# Patient Record
Sex: Female | Born: 1960 | Race: White | Hispanic: No | Marital: Single | Smoking: Never smoker
Health system: Southern US, Community
[De-identification: ages and names within clinical notes are randomized; demographics above are authoritative.]

## PROBLEM LIST (undated history)

## (undated) DIAGNOSIS — K219 Gastro-esophageal reflux disease without esophagitis: Secondary | ICD-10-CM

## (undated) DIAGNOSIS — C029 Malignant neoplasm of tongue, unspecified: Secondary | ICD-10-CM

## (undated) DIAGNOSIS — F419 Anxiety disorder, unspecified: Secondary | ICD-10-CM

## (undated) DIAGNOSIS — F32A Depression, unspecified: Secondary | ICD-10-CM

## (undated) HISTORY — PX: TUBAL LIGATION: SHX77

## (undated) HISTORY — PX: TONSILLECTOMY: SUR1361

---

## 2004-05-12 ENCOUNTER — Emergency Department: Payer: Self-pay | Admitting: Emergency Medicine

## 2004-05-14 ENCOUNTER — Ambulatory Visit: Payer: Self-pay | Admitting: Emergency Medicine

## 2004-06-22 ENCOUNTER — Ambulatory Visit: Payer: Self-pay

## 2007-05-25 ENCOUNTER — Ambulatory Visit: Payer: Self-pay

## 2011-06-24 ENCOUNTER — Emergency Department: Payer: Self-pay | Admitting: Emergency Medicine

## 2011-06-24 LAB — CBC
HCT: 41.4 % (ref 35.0–47.0)
HGB: 13.7 g/dL (ref 12.0–16.0)
MCHC: 33 g/dL (ref 32.0–36.0)
Platelet: 304 10*3/uL (ref 150–440)
RBC: 4.74 10*6/uL (ref 3.80–5.20)
RDW: 14.2 % (ref 11.5–14.5)

## 2011-06-24 LAB — URINALYSIS, COMPLETE
Bilirubin,UR: NEGATIVE
Blood: NEGATIVE
Glucose,UR: NEGATIVE mg/dL (ref 0–75)
Ketone: NEGATIVE
Nitrite: NEGATIVE
Ph: 5 (ref 4.5–8.0)
Protein: NEGATIVE
RBC,UR: 2 /HPF (ref 0–5)
Specific Gravity: 1.01 (ref 1.003–1.030)
Squamous Epithelial: 15
WBC UR: 6 /HPF (ref 0–5)

## 2011-06-24 LAB — COMPREHENSIVE METABOLIC PANEL
Alkaline Phosphatase: 99 U/L (ref 50–136)
Anion Gap: 5 — ABNORMAL LOW (ref 7–16)
Bilirubin,Total: 0.4 mg/dL (ref 0.2–1.0)
Calcium, Total: 9.4 mg/dL (ref 8.5–10.1)
Chloride: 105 mmol/L (ref 98–107)
Co2: 30 mmol/L (ref 21–32)
Creatinine: 0.91 mg/dL (ref 0.60–1.30)
EGFR (African American): >60
EGFR (Non-African Amer.): >60
Glucose: 101 mg/dL — ABNORMAL HIGH (ref 65–99)
Osmolality: 281 (ref 275–301)
Potassium: 4.3 mmol/L (ref 3.5–5.1)
SGPT (ALT): 18 U/L
Sodium: 140 mmol/L (ref 136–145)

## 2011-06-24 LAB — LIPASE, BLOOD: Lipase: 192 U/L (ref 73–393)

## 2014-04-22 DIAGNOSIS — M79605 Pain in left leg: Secondary | ICD-10-CM | POA: Insufficient documentation

## 2014-04-22 DIAGNOSIS — Z87898 Personal history of other specified conditions: Secondary | ICD-10-CM | POA: Insufficient documentation

## 2014-04-22 DIAGNOSIS — R0602 Shortness of breath: Secondary | ICD-10-CM | POA: Insufficient documentation

## 2014-04-22 DIAGNOSIS — R0789 Other chest pain: Secondary | ICD-10-CM | POA: Insufficient documentation

## 2014-07-05 ENCOUNTER — Encounter: Payer: Self-pay | Admitting: Urgent Care

## 2014-07-05 DIAGNOSIS — L509 Urticaria, unspecified: Secondary | ICD-10-CM | POA: Diagnosis not present

## 2014-07-05 DIAGNOSIS — R51 Headache: Secondary | ICD-10-CM | POA: Diagnosis not present

## 2014-07-05 DIAGNOSIS — R109 Unspecified abdominal pain: Secondary | ICD-10-CM | POA: Diagnosis not present

## 2014-07-05 DIAGNOSIS — R21 Rash and other nonspecific skin eruption: Secondary | ICD-10-CM | POA: Diagnosis present

## 2014-07-05 NOTE — ED Notes (Signed)
Patient presents with a 7 day history of rash to entire body; "It goes and comes back." Denies known etiology; no new meds, soaps, detergents, cosmetics, foods, etc.

## 2014-07-06 ENCOUNTER — Encounter: Payer: Self-pay | Admitting: Emergency Medicine

## 2014-07-06 ENCOUNTER — Emergency Department
Admission: EM | Admit: 2014-07-06 | Discharge: 2014-07-06 | Disposition: A | Discharge status: Home / Self Care | Payer: No Typology Code available for payment source | Attending: Emergency Medicine | Admitting: Emergency Medicine

## 2014-07-06 DIAGNOSIS — L299 Pruritus, unspecified: Secondary | ICD-10-CM

## 2014-07-06 DIAGNOSIS — L509 Urticaria, unspecified: Secondary | ICD-10-CM

## 2014-07-06 MED ORDER — FAMOTIDINE 20 MG PO TABS
40.0000 mg | ORAL_TABLET | Freq: Once | ORAL | Status: AC
  Administered 2014-07-06: 40 mg via ORAL

## 2014-07-06 MED ORDER — PREDNISONE 20 MG PO TABS
60.0000 mg | ORAL_TABLET | Freq: Once | ORAL | Status: AC
  Administered 2014-07-06: 60 mg via ORAL

## 2014-07-06 MED ORDER — HYDROXYZINE HCL 25 MG PO TABS
ORAL_TABLET | ORAL | Status: AC
  Administered 2014-07-06: 25 mg via ORAL
  Filled 2014-07-06: qty 1

## 2014-07-06 MED ORDER — FAMOTIDINE 40 MG PO TABS
40.0000 mg | ORAL_TABLET | Freq: Every evening | ORAL | Status: DC
Start: 2014-07-06 — End: 2018-12-21

## 2014-07-06 MED ORDER — FAMOTIDINE 20 MG PO TABS
ORAL_TABLET | ORAL | Status: AC
  Administered 2014-07-06: 40 mg via ORAL
  Filled 2014-07-06: qty 2

## 2014-07-06 MED ORDER — PREDNISONE 20 MG PO TABS: 60.0000 mg | ORAL_TABLET | Freq: Every day | ORAL | Status: AC

## 2014-07-06 MED ORDER — PREDNISONE 20 MG PO TABS
ORAL_TABLET | ORAL | Status: AC
  Administered 2014-07-06: 60 mg via ORAL
  Filled 2014-07-06: qty 3

## 2014-07-06 MED ORDER — HYDROXYZINE HCL 10 MG PO TABS
10.0000 mg | ORAL_TABLET | Freq: Three times a day (TID) | ORAL | Status: DC | PRN
Start: 2014-07-06 — End: 2018-12-21

## 2014-07-06 MED ORDER — HYDROXYZINE HCL 25 MG PO TABS
25.0000 mg | ORAL_TABLET | Freq: Once | ORAL | Status: AC
  Administered 2014-07-06: 25 mg via ORAL

## 2014-07-06 NOTE — Discharge Instructions (Signed)
Hives Hives are itchy, red, puffy (swollen) areas of the skin. Hives can change in size and location on your body. Hives can come and go for hours, days, or weeks. Hives do not spread from person to person (noncontagious). Scratching, exercise, and stress can make your hives worse. HOME CARE  Avoid things that cause your hives (triggers).  Take antihistamine medicines as told by your doctor. Do not drive while taking an antihistamine.  Take any other medicines for itching as told by your doctor.  Wear loose-fitting clothing.  Keep all doctor visits as told. GET HELP RIGHT AWAY IF:   You have a fever.  Your tongue or lips are puffy.  You have trouble breathing or swallowing.  You feel tightness in the throat or chest.  You have belly (abdominal) pain.  You have lasting or severe itching that is not helped by medicine.  You have painful or puffy joints. These problems may be the first sign of a life-threatening allergic reaction. Call your local emergency services (911 in [place]S.). MAKE SURE YOU:   Understand these instructions.  Will watch your condition.  Will get help right away if you are not doing well or get worse. Document Released: 10/31/2007 Document Revised: 07/23/2011 Document Reviewed: 04/16/2011 [facility] Patient Information 2015 [place], [place]. This information is not intended to replace advice given to you by your health care provider. Make sure you discuss any questions you have with your health care provider.  Pruritus  Pruritus is an itch. There are many different problems that can cause an itch. Dry skin is one of the most common causes of itching. Most cases of itching do not require medical attention.  HOME CARE INSTRUCTIONS  Make sure your skin is moistened on a regular basis. A moisturizer that contains petroleum jelly is best for keeping moisture in your skin. If you develop a rash, you may try the following for relief:   Use corticosteroid  cream.  Apply cool compresses to the affected areas.  Bathe with Epsom salts or baking soda in the bathwater.  Soak in colloidal oatmeal baths. These are available at your pharmacy.  Apply baking soda paste to the rash. Stir water into baking soda until it reaches a paste-like consistency.  Use an anti-itch lotion.  Take over-the-counter diphenhydramine medicine by mouth as the instructions direct.  Avoid scratching. Scratching may cause the rash to become infected. If itching is very bad, your caregiver may suggest prescription lotions or creams to lessen your symptoms.  Avoid hot showers, which can make itching worse. A cold shower may help with itching as long as you use a moisturizer after the shower. SEEK MEDICAL CARE IF: The itching does not go away after several days. Document Released: 10/03/2010 Document Revised: 06/07/2013 Document Reviewed: 10/03/2010 [facility] Patient Information 2015 [place], [place]. This information is not intended to replace advice given to you by your health care provider. Make sure you discuss any questions you have with your health care provider.

## 2014-07-06 NOTE — ED Provider Notes (Signed)
[facility] Emergency Department Provider Note  ____________________________________________  Time seen: Approximately 215 AM  I have reviewed the triage vital signs and the nursing notes.   HISTORY  Chief Complaint Rash    HPI MCCARTNEY BRUCKS is a 54 y.o. female who comes in with a rash that has been coming and going for approximately one week. She reports that she has been itching significantly with this rash and has been taking Benadryl without any relief. The patient denies any new soaps or creams and lotions foods or cosmetics. She is unsure what is the cause of the symptoms. She also reports that when the rash becomes bad she also gets a headache. Currently her headache is a 4-5 out of 10 in intensity of top of her head. She reports the itching is driving her crazy and left work to come in for evaluation. The patient reports that the Benadryl just makes her sleepy. She reports that the more active she becomes the more she notices the rash. The patient does not have a primary care physician. She said last week she did notice some swelling of her eye as well as her lipalong with the rash.   History reviewed. No pertinent past medical history.  There are no active problems to display for this patient.   History reviewed. No pertinent past surgical history.  Current Outpatient Rx  Name  Route  Sig  Dispense  Refill  . famotidine (PEPCID) 40 MG tablet   Oral   Take 1 tablet (40 mg total) by mouth every evening.   10 tablet   0   . hydrOXYzine (ATARAX/VISTARIL) 10 MG tablet   Oral   Take 1 tablet (10 mg total) by mouth 3 (three) times daily as needed.   15 tablet   0   . predniSONE (DELTASONE) 20 MG tablet   Oral   Take 3 tablets (60 mg total) by mouth daily.   12 tablet   0     Allergies Review of patient's allergies indicates no known allergies.  No family history on file.  Social History History  Substance Use Topics  . Smoking  status: Never Smoker   . Smokeless tobacco: Not on file  . Alcohol Use: Yes    Review of Systems Constitutional: No fever/chills Eyes: No visual changes. ENT: No sore throat. Cardiovascular: A chest cramp multiple days ago. Respiratory: Denies shortness of breath. Gastrointestinal: Occasional left-sided abdominal pain with no nausea or vomiting. Genitourinary: Negative for dysuria. Musculoskeletal: Negative for back pain. Skin: Rash and itching all over her body. Neurological: Negative for headaches, focal weakness or numbness.  10-point ROS otherwise negative.  ____________________________________________   PHYSICAL EXAM:  VITAL SIGNS: ED Triage Vitals  Enc Vitals Group     BP 07/05/14 2120 152/96 mmHg     Pulse Rate 07/05/14 2120 96     Resp 07/05/14 2120 18     Temp 07/05/14 2120 98.6 F (37 C)     Temp Source 07/05/14 2120 Oral     SpO2 07/05/14 2120 98 %     Weight 07/05/14 2120 180 lb (81.647 kg)     Height 07/05/14 2120 5\' 3"  (1.6 m)     Head Cir --      Peak Flow --      Pain Score 07/05/14 2120 4     Pain Loc --      Pain Edu? --      Excl. in [place]? --  Constitutional: Alert and oriented. Well appearing and in moderate distress. Eyes: Conjunctivae are normal. PERRL. EOMI. Head: Atraumatic. Nose: No congestion/rhinnorhea. Mouth/Throat: Mucous membranes are moist.  Oropharynx non-erythematous. Cardiovascular: Normal rate, regular rhythm. Grossly normal heart sounds.  Good peripheral circulation. Respiratory: Normal respiratory effort.  No retractions. Lungs CTAB. Gastrointestinal: Soft and nontender. No distention. Positive bowel sounds Genitourinary: Deferred Musculoskeletal: No lower extremity tenderness nor edema.   Neurologic:  Normal speech and language. No gross focal neurologic deficits are appreciated.  Skin:  Scattered erythema and hives all over body on patient's abdomen and breasts arms and back. Psychiatric: Mood and affect are normal.    ____________________________________________   LABS (all labs ordered are listed, but only abnormal results are displayed)  Labs Reviewed - No data to display ____________________________________________  EKG  None ____________________________________________  RADIOLOGY  None ____________________________________________   PROCEDURES  Procedure(s) performed: None  Critical Care performed: No  ____________________________________________   INITIAL IMPRESSION / ASSESSMENT AND PLAN / ED COURSE  Pertinent labs & imaging results that were available during my care of the patient were reviewed by me and considered in my medical decision making (see chart for details).  The patient is a 54 year old female who comes in with a rash and urticaria diffusely all over her body. The patient reports that the Benadryl is not working and is unsure if she needs a steroid. She also is unsure if the trigger of the symptoms. I will give the patient a dose of prednisone, a dose of Pepcid and some Vistaril and reassess the patient.  ----------------------------------------- 4:26 AM on 07/06/2014 -----------------------------------------  The patient reports that she does still have some itching is improved on her abdomen and chest but she does still have some itching in her legs. I will discharge the patient home to follow-up with [facility] for further evaluation of her hives and itching. ____________________________________________   FINAL CLINICAL IMPRESSION(S) / ED DIAGNOSES  Final diagnoses:  Hives  Itching      Loney Hering, MD 07/06/14 [id number]

## 2016-05-02 ENCOUNTER — Ambulatory Visit (HOSPITAL_COMMUNITY): Payer: No Typology Code available for payment source

## 2016-05-07 ENCOUNTER — Other Ambulatory Visit: Payer: Self-pay | Admitting: Obstetrics and Gynecology

## 2016-05-07 DIAGNOSIS — Z1231 Encounter for screening mammogram for malignant neoplasm of breast: Secondary | ICD-10-CM

## 2016-05-16 ENCOUNTER — Ambulatory Visit (HOSPITAL_COMMUNITY): Payer: No Typology Code available for payment source

## 2018-12-21 ENCOUNTER — Emergency Department: Payer: Self-pay

## 2018-12-21 ENCOUNTER — Emergency Department
Admission: EM | Admit: 2018-12-21 | Discharge: 2018-12-21 | Disposition: A | Discharge status: Home / Self Care | Payer: Self-pay | Attending: Emergency Medicine | Admitting: Emergency Medicine

## 2018-12-21 ENCOUNTER — Other Ambulatory Visit: Payer: Self-pay

## 2018-12-21 ENCOUNTER — Encounter: Payer: Self-pay | Admitting: Emergency Medicine

## 2018-12-21 DIAGNOSIS — M542 Cervicalgia: Secondary | ICD-10-CM | POA: Insufficient documentation

## 2018-12-21 DIAGNOSIS — R590 Localized enlarged lymph nodes: Secondary | ICD-10-CM | POA: Insufficient documentation

## 2018-12-21 DIAGNOSIS — Z20828 Contact with and (suspected) exposure to other viral communicable diseases: Secondary | ICD-10-CM | POA: Insufficient documentation

## 2018-12-21 LAB — COMPREHENSIVE METABOLIC PANEL
ALT: 15 U/L (ref 0–44)
AST: 20 U/L (ref 15–41)
Albumin: 4.2 g/dL (ref 3.5–5.0)
Alkaline Phosphatase: 97 U/L (ref 38–126)
Anion gap: 11 (ref 5–15)
BUN: 12 mg/dL (ref 6–20)
CO2: 25 mmol/L (ref 22–32)
Calcium: 9.6 mg/dL (ref 8.9–10.3)
Chloride: 105 mmol/L (ref 98–111)
Creatinine, Ser: 0.79 mg/dL (ref 0.44–1.00)
GFR calc Af Amer: >60 mL/min (ref >60)
GFR calc non Af Amer: >60 mL/min (ref >60)
Glucose, Bld: 180 mg/dL — ABNORMAL HIGH (ref 70–99)
Potassium: 3.7 mmol/L (ref 3.5–5.1)
Sodium: 141 mmol/L (ref 135–145)
Total Bilirubin: 0.7 mg/dL (ref 0.3–1.2)
Total Protein: 8.1 g/dL (ref 6.5–8.1)

## 2018-12-21 LAB — CBC WITH DIFFERENTIAL/PLATELET
Abs Immature Granulocytes: 0.04 10*3/uL (ref 0.00–0.07)
Basophils Absolute: 0 10*3/uL (ref 0.0–0.1)
Basophils Relative: 0 %
Eosinophils Absolute: 0.2 10*3/uL (ref 0.0–0.5)
Eosinophils Relative: 1 %
HCT: 41.7 % (ref 36.0–46.0)
Hemoglobin: 13.8 g/dL (ref 12.0–15.0)
Immature Granulocytes: 0 %
Lymphocytes Relative: 15 %
Lymphs Abs: 1.8 10*3/uL (ref 0.7–4.0)
MCH: 29.2 pg (ref 26.0–34.0)
MCHC: 33.1 g/dL (ref 30.0–36.0)
MCV: 88.3 fL (ref 80.0–100.0)
Monocytes Absolute: 0.8 10*3/uL (ref 0.1–1.0)
Monocytes Relative: 6 %
Neutro Abs: 9.5 10*3/uL — ABNORMAL HIGH (ref 1.7–7.7)
Neutrophils Relative %: 78 %
Platelets: 282 10*3/uL (ref 150–400)
RBC: 4.72 MIL/uL (ref 3.87–5.11)
RDW: 12.4 % (ref 11.5–15.5)
WBC: 12.3 10*3/uL — ABNORMAL HIGH (ref 4.0–10.5)
nRBC: 0 % (ref 0.0–0.2)

## 2018-12-21 LAB — SARS CORONAVIRUS 2 (TAT 6-24 HRS): SARS Coronavirus 2: NEGATIVE

## 2018-12-21 MED ORDER — PREDNISONE 10 MG (21) PO TBPK: ORAL_TABLET | ORAL | 0 refills | Status: DC

## 2018-12-21 MED ORDER — AMOXICILLIN-POT CLAVULANATE 875-125 MG PO TABS: 1.0000 | ORAL_TABLET | Freq: Two times a day (BID) | ORAL | 0 refills | Status: AC

## 2018-12-21 MED ORDER — IOHEXOL 300 MG/ML  SOLN
75.0000 mL | Freq: Once | INTRAMUSCULAR | Status: AC | PRN
  Administered 2018-12-21: 75 mL via INTRAVENOUS

## 2018-12-21 MED ORDER — SODIUM CHLORIDE 0.9 % IV SOLN
3.0000 g | Freq: Once | INTRAVENOUS | Status: AC
  Administered 2018-12-21: 3 g via INTRAVENOUS
  Filled 2018-12-21: qty 8

## 2018-12-21 MED ORDER — DEXAMETHASONE SODIUM PHOSPHATE 10 MG/ML IJ SOLN
10.0000 mg | Freq: Once | INTRAMUSCULAR | Status: AC
  Administered 2018-12-21: 10 mg via INTRAVENOUS
  Filled 2018-12-21: qty 1

## 2018-12-21 MED ORDER — OXYCODONE-ACETAMINOPHEN 5-325 MG PO TABS: 1.0000 | ORAL_TABLET | Freq: Three times a day (TID) | ORAL | 0 refills | Status: DC | PRN

## 2018-12-21 MED ORDER — KETOROLAC TROMETHAMINE 30 MG/ML IJ SOLN
30.0000 mg | Freq: Once | INTRAMUSCULAR | Status: AC
  Administered 2018-12-21: 30 mg via INTRAVENOUS
  Filled 2018-12-21: qty 1

## 2018-12-21 NOTE — ED Triage Notes (Signed)
Pt reports for the past few days she has had swelling to the left side of her neck and now the left side of her face and left ear hurts as well. Pt reports a month ago she fell in the bathtub and hurt that side of her neck but isn't sure if that is what caused the swelling.

## 2018-12-21 NOTE — Progress Notes (Signed)
[facility]  [address], [address] [place], [place] [address] Phone: [phone]  Fax: [phone]   Clinic Day:  12/22/2018  Referring physician: Earleen Newport, MD  Chief Complaint: Connie Cameron is a 58 y.o. female with left neck adenopathy who is referred in consultation by Dr. Algis Liming. Williams for assessment and management.   HPI:   The patient notes that the left side of her neck has been swollen for over a year.  By report, she saw Dr Richardson Landry in ENT in 2016 with left neck adenopathy and urticaria.  FNA was performed, but no report is available.  She states that she didn't hear anything, and thus felt the results were fine.  She did not see dermatology.  The patient was presented to the [facility] ER on 12/21/2018 with a swollen neck.  She noted 4 days of increased pain and swelling in her left neck.  Pain radiated into the left side of her face and left ear.  Physical exam revealed a large left-sided anterior cervical neck swelling. Area was tender to touch without overlying erythema.  She has had no scalp or dental issues.  Temperature was 100.1.  Neck CT on 12/21/2018 revealed a 2.4 x 2.1 x 3.4 cm lymph node in the left upper neck.  The lymph node showed heterogeneous enhancement and stranding in the surrounding fat. There was a 6 mm left upper posterior lymph node and a 7 mm right level 3 lymph node.  Imaging was concerning for malignancy.  There were no other enlarged lymph nodes.  There was no pharyngeal mass. Biopsy was recommended.  Direct mucosal inspection was recommended.  Labs on 12/21/2018 included a hematocrit 41.7, hemoglobin 13.8, MCV 88.3, platelets 282,000, WBC 12,300 (ANC 9,500).  COVID-19 testing was negative.  CXR revealed minimal bibasilar atelectasis.    She received IV Unasyn, Decadron and Toradol for pain.  She was prescribed Augmentin  Symptomatically, she is doing "ok".  She describes some element of fatigue.  She denies any  fevers, sweats or weight loss.  She notes that she does not go to the doctors office unless it is absolutely necessary. She does not have a PCP.  Neck pain has decreased since yesterday after taking the prescribed medication.  She has some ear pain that radiates up from her neck.  She denies any headaches, fevers, rhinorrhea, or sore throat.  She denies any abdominal and urinary symptoms. She denies any bone or joint pain.    History reviewed. No pertinent past medical history.  History reviewed. No pertinent surgical history.  Family History  Problem Relation Age of Onset   Breast cancer Sister    Brain cancer Paternal Grandfather   She has multiple aunts (paternal) who had breast cancer and bladder cancer. Her paternal cousinhas leukemia.    Social History:  reports that she has never smoked. She has never used smokeless tobacco. She reports current alcohol use. She reports that she does not use drugs.  She use to drink years ago. She denies any exposure to radiation or toxins. 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  She is currently not working.  She lives in [place].  The patient is accompanied by her boyfriend, Jeneen Rinks, over the [place] today.   Allergies: No Known Allergies  Current Medications: Current Outpatient Medications  Medication Sig Dispense Refill   amoxicillin-clavulanate (AUGMENTIN) 875-125 MG tablet Take 1 tablet by mouth every 12 (twelve) hours for 10 days. 20 tablet  0   oxyCODONE-acetaminophen (PERCOCET) 5-325 MG tablet Take 1 tablet by mouth every 8 (eight) hours as needed. 20 tablet 0   predniSONE (STERAPRED UNI-PAK 21 TAB) 10 MG (21) TBPK tablet Dispense taper pack as directed 21 tablet 0   No current facility-administered medications for this visit.     Review of Systems  Constitutional: Negative for chills, diaphoresis, fever, malaise/fatigue and weight loss.       Feels "kind of fatigued".  HENT: Positive for ear pain. Negative for congestion, hearing  loss, nosebleeds, sinus pain, sore throat and tinnitus.        No dental issues.  Eyes: Positive for blurred vision (has not seen opthalmologist in some time). Negative for double vision and photophobia.  Respiratory: Negative.  Negative for cough, hemoptysis, sputum production and shortness of breath.   Cardiovascular: Negative.  Negative for chest pain, palpitations and leg swelling.  Gastrointestinal: Negative.  Negative for abdominal pain, blood in stool, constipation, diarrhea, melena, nausea and vomiting.  Genitourinary: Negative.  Negative for dysuria, frequency, hematuria and urgency.  Musculoskeletal: Positive for neck pain (left neck; increased pain x 4 days). Negative for back pain, joint pain and myalgias.       Left neck swollen > 1 year.  Skin: Negative.  Negative for itching and rash.  Neurological: Negative.  Negative for dizziness, tingling, sensory change, focal weakness, weakness and headaches.  Endo/Heme/Allergies: Negative.  Does not bruise/bleed easily.  Psychiatric/Behavioral: Negative for depression and memory loss. The patient is nervous/anxious. The patient does not have insomnia.   All other systems reviewed and are negative.  Performance status (ECOG): 1  Vitals Blood pressure (!) 147/89, pulse 96, temperature (!) 96.6 F (35.9 C), temperature source Tympanic, resp. rate 18, height 5\' 3"  (1.6 m), weight 175 lb (79.4 kg), SpO2 100 %.  Physical Exam  Constitutional: She is oriented to person, place, and time. She appears well-developed and well-nourished. No distress. Face mask in place.  HENT:  Head: Normocephalic and atraumatic. Hair is normal (calp unremarkable).  Right Ear: Tympanic membrane and ear canal normal.  Left Ear: Tympanic membrane and ear canal normal.  Mouth/Throat: Oropharynx is clear and moist. No oropharyngeal exudate.  Long gray blonde hair pulled in ponytail.  No dental issues.  S/p tonsillectomy.  No pain on clenching teeth.  Left TM  slightly dull, but without bulging or erythema.  Eyes: Pupils are equal, round, and reactive to light. Conjunctivae and EOM are normal. No scleral icterus.  Neck: Normal range of motion. Neck supple.  Cardiovascular: Normal rate and normal heart sounds.  No murmur heard. Pulmonary/Chest: Effort normal and breath sounds normal. No respiratory distress. She has no wheezes. She has no rales.  Abdominal: Soft. Bowel sounds are normal. She exhibits no distension and no mass. There is no abdominal tenderness. There is no rebound and no guarding.  Normal spleen.  Musculoskeletal: Normal range of motion.        General: No tenderness or edema.  Lymphadenopathy:       Head (right side): No preauricular, no posterior auricular and no occipital adenopathy present.       Head (left side): No preauricular, no posterior auricular and no occipital adenopathy present.    She has cervical adenopathy (left ).    She has no axillary adenopathy.       Right: No inguinal and no supraclavicular adenopathy present.       Left: No inguinal and no supraclavicular adenopathy present.  Egg shaped 3.5  x 2.0 cm tender high left anterior cervical lymph node.   Neurological: She is alert and oriented to person, place, and time.  Skin: Skin is warm and dry. No rash noted. She is not diaphoretic. No erythema. No pallor.  Right neck and lower back tattoo.  Psychiatric: She has a normal mood and affect. Her behavior is normal. Judgment and thought content normal.  Nursing note and vitals reviewed.   Admission on 12/21/2018, Discharged on 12/21/2018  Component Date Value Ref Range Status   WBC 12/21/2018 12.3* 4.0 - 10.5 K/uL Final   RBC 12/21/2018 4.72  3.87 - 5.11 MIL/uL Final   Hemoglobin 12/21/2018 13.8  12.0 - 15.0 g/dL Final   HCT 12/21/2018 41.7  36.0 - 46.0 % Final   MCV 12/21/2018 88.3  80.0 - 100.0 fL Final   MCH 12/21/2018 29.2  26.0 - 34.0 pg Final   MCHC 12/21/2018 33.1  30.0 - 36.0 g/dL Final    RDW 12/21/2018 12.4  11.5 - 15.5 % Final   Platelets 12/21/2018 282  150 - 400 K/uL Final   nRBC 12/21/2018 0.0  0.0 - 0.2 % Final   Neutrophils Relative % 12/21/2018 78  % Final   Neutro Abs 12/21/2018 9.5* 1.7 - 7.7 K/uL Final   Lymphocytes Relative 12/21/2018 15  % Final   Lymphs Abs 12/21/2018 1.8  0.7 - 4.0 K/uL Final   Monocytes Relative 12/21/2018 6  % Final   Monocytes Absolute 12/21/2018 0.8  0.1 - 1.0 K/uL Final   Eosinophils Relative 12/21/2018 1  % Final   Eosinophils Absolute 12/21/2018 0.2  0.0 - 0.5 K/uL Final   Basophils Relative 12/21/2018 0  % Final   Basophils Absolute 12/21/2018 0.0  0.0 - 0.1 K/uL Final   Immature Granulocytes 12/21/2018 0  % Final   Abs Immature Granulocytes 12/21/2018 0.04  0.00 - 0.07 K/uL Final   Performed at [facility], [address] [place], [place] [address]   Sodium 12/21/2018 141  135 - 145 mmol/L Final   Potassium 12/21/2018 3.7  3.5 - 5.1 mmol/L Final   Chloride 12/21/2018 105  98 - 111 mmol/L Final   CO2 12/21/2018 25  22 - 32 mmol/L Final   Glucose, Bld 12/21/2018 180* 70 - 99 mg/dL Final   BUN 12/21/2018 12  6 - 20 mg/dL Final   Creatinine, Ser 12/21/2018 0.79  0.44 - 1.00 mg/dL Final   Calcium 12/21/2018 9.6  8.9 - 10.3 mg/dL Final   Total Protein 12/21/2018 8.1  6.5 - 8.1 g/dL Final   Albumin 12/21/2018 4.2  3.5 - 5.0 g/dL Final   AST 12/21/2018 20  15 - 41 U/L Final   ALT 12/21/2018 15  0 - 44 U/L Final   Alkaline Phosphatase 12/21/2018 97  38 - 126 U/L Final   Total Bilirubin 12/21/2018 0.7  0.3 - 1.2 mg/dL Final   GFR calc non Af Amer 12/21/2018 >60  >60 mL/min Final   GFR calc Af Amer 12/21/2018 >60  >60 mL/min Final   Anion gap 12/21/2018 11  5 - 15 Final   Performed at [facility], [address] [place],  [address]   SARS Coronavirus 2 12/21/2018 NEGATIVE  NEGATIVE Final   Comment: (NOTE) SARS-CoV-2 target nucleic acids are NOT DETECTED. The SARS-CoV-2 RNA  is generally detectable in upper and lower respiratory specimens during the acute phase of infection. Negative results do not preclude SARS-CoV-2 infection, do not rule out co-infections with other  pathogens, and should not be used as the sole basis for treatment or other patient management decisions. Negative results must be combined with clinical observations, patient history, and epidemiological information. The expected result is Negative. Fact Sheet for Patients: SugarRoll.be Fact Sheet for Healthcare Providers: https://www.woods-mathews.com/ This test is not yet approved or cleared by the [place] FDA and  has been authorized for detection and/or diagnosis of SARS-CoV-2 by FDA under an Emergency Use Authorization (EUA). This EUA will remain  in effect (meaning this test can be used) for the duration of the COVID-19 declaration under Section 56                          4(b)(1) of the Act, 21 U.S.C. section 360bbb-3(b)(1), unless the authorization is terminated or revoked sooner. Performed at [facility], [place] [address] [place], [place] [address]     Assessment:  Connie Cameron is a 58 y.o. female with left cervical adenopathy.  She has a history of left cervical adenopathy with associated urticaria dating back to 2016.  FNA by report was apparently normal (no report available).  Neck CT on 12/21/2018 revealed a 2.4 x 2.1 x 3.4 cm lymph node in the left upper neck.  The lymph node showed heterogeneous enhancement and stranding in the surrounding fat. There was a 6 mm left upper posterior lymph node and a 7 mm right level 3 lymph node.  Imaging was concerning for malignancy.  There were no other enlarged lymph nodes.  There was no pharyngeal mass. Biopsy was recommended.  Direct mucosal inspection was recommended.  She received Unasyn on 12/21/2018 and began Augmentin.  Symptomatically, she notes increasing tender left cervical  adenopathy x 4 days.  She is feeling better after initiation of antibiotics and steroids.  Exam reveals a 3.5 x 2.0 cm tender high left anterior cervical lymph node.  There is no obvious localizing infection.  Plan: 1.   Labs today:  LDH, uric acid, sed rate, PT/INR. 2.   Cervical adenopathy  Patient has a > 1 year history of left neck adenopathy.  She has had recent increased swelling and tenderness clinically suspicious for adenitis.  She denies any B symptoms (although temperature noted in ER yesterday).  Exam reveals no other adenopathy.  Discuss plan for ENT referral for evaluation.  Discuss completion of antibiotic course and re-evaluation.  Discuss possible core needle biopsy of lymph node if adenopathy does not resolve. 3.   Consult Dr Kathyrn Sheriff, ENT (called) - patient to be seen today to tomorrow. 4.   RTC in 1 week for MD assessment and discussion regarding direction of therapy.    I discussed the assessment and treatment plan with the patient.  The patient was provided an opportunity to ask questions and all were answered.  The patient agreed with the plan and demonstrated an understanding of the instructions.  The patient was advised to call back if the symptoms worsen or if the condition fails to improve as anticipated.   Monique Hefty C. Mike Gip, MD, PhD    12/22/2018, 8:45 AM  I, Selena Batten, am acting as scribe for [place]. Mike Gip, MD, PhD.  I, Linlee Cromie C. Mike Gip, MD, have reviewed the above documentation for accuracy and completeness, and I agree with the above.

## 2018-12-21 NOTE — ED Provider Notes (Addendum)
[facility] Emergency Department Provider Note       Time seen: ----------------------------------------- 12:18 PM on 12/21/2018 -----------------------------------------   I have reviewed the triage vital signs and the nursing notes.  HISTORY   Chief Complaint swollen neck    HPI Connie Cameron is a 58 y.o. female with no known past medical history who presents to the ED for left-sided neck pain.  Patient reports left-sided neck swelling for some time, perhaps months or even a year or more.  She states that for the past several days she has had increased swelling and pain that radiates into the left side of her face and left ear.  She fell about a month ago and injured her left arm is not sure if that is related to the swelling or not.  She does report a family history of cancer.  History reviewed. No pertinent past medical history.  There are no active problems to display for this patient.   History reviewed. No pertinent surgical history.  Allergies Patient has no known allergies.  Social History Social History   Tobacco Use  . Smoking status: Never Smoker  Substance Use Topics  . Alcohol use: Yes  . Drug use: No   Review of Systems Constitutional: Negative for fever.  Negative for weight loss or weight gain, negative for night sweats HEENT: Positive for left-sided neck swelling and pain Cardiovascular: Negative for chest pain. Respiratory: Negative for shortness of breath. Gastrointestinal: Negative for abdominal pain, vomiting and diarrhea. Musculoskeletal: Negative for back pain. Skin: Negative for rash. Neurological: Negative for headaches, focal weakness or numbness.  All systems negative/normal/unremarkable except as stated in the HPI  ____________________________________________   PHYSICAL EXAM:  VITAL SIGNS: ED Triage Vitals  Enc Vitals Group     BP 12/21/18 1034 (!) 158/104     Pulse Rate 12/21/18 1034 (!) 110     Resp  12/21/18 1034 19     Temp 12/21/18 1034 100.1 F (37.8 C)     Temp Source 12/21/18 1034 Oral     SpO2 12/21/18 1034 98 %     Weight 12/21/18 1032 155 lb (70.3 kg)     Height 12/21/18 1032 5\' 3"  (1.6 m)     Head Circumference --      Peak Flow --      Pain Score 12/21/18 1031 7     Pain Loc --      Pain Edu? --      Excl. in [place]? --    Constitutional: Alert and oriented. Well appearing and in no distress. Eyes: Conjunctivae are normal. Normal extraocular movements. ENT      Head: Normocephalic and atraumatic.      Nose: No congestion/rhinnorhea.      Mouth/Throat: Mucous membranes are moist.      Neck: Large left-sided anterior cervical neck swelling.  Swelling appears to be in typical location for cervical lymph node.  This appears to be separate from the angle of the mandible.  Area is tender to touch.  No overlying erythema is noted Cardiovascular: Normal rate, regular rhythm. No murmurs, rubs, or gallops. Respiratory: Normal respiratory effort without tachypnea nor retractions. Breath sounds are clear and equal bilaterally. No wheezes/rales/rhonchi. Musculoskeletal: Nontender with normal range of motion in extremities. No lower extremity tenderness nor edema. Neurologic:  Normal speech and language. No gross focal neurologic deficits are appreciated.  Skin:  Skin is warm, dry and intact. No rash noted. Psychiatric: Mood and affect are normal. Speech  and behavior are normal.  ____________________________________________  ED COURSE:  As part of my medical decision making, I reviewed the following data within the [place] History obtained from family if available, nursing notes, old chart and ekg, as well as notes from prior ED visits. Patient presented for left-sided neck swelling and pain, we will assess with labs and imaging as indicated at this time.   Procedures  Connie Cameron was evaluated in Emergency Department on 12/21/2018 for the symptoms described  in the history of present illness. She was evaluated in the context of the global COVID-19 pandemic, which necessitated consideration that the patient might be at risk for infection with the SARS-CoV-2 virus that causes COVID-19. Institutional protocols and algorithms that pertain to the evaluation of patients at risk for COVID-19 are in a state of rapid change based on information released by regulatory bodies including the CDC and federal and state organizations. These policies and algorithms were followed during the patient's care in the ED.  ____________________________________________   LABS (pertinent positives/negatives)  Labs Reviewed  CBC WITH DIFFERENTIAL/PLATELET - Abnormal; Notable for the following components:      Result Value   WBC 12.3 (*)    Neutro Abs 9.5 (*)    All other components within normal limits  COMPREHENSIVE METABOLIC PANEL - Abnormal; Notable for the following components:   Glucose, Bld 180 (*)    All other components within normal limits    RADIOLOGY Images were viewed by me CT neck with contrast IMPRESSION: Enlarged lymph node in the left upper neck measuring 2.4 x 2.1 x 3.4 cm. The lymph node shows heterogeneous enhancement and stranding in the surrounding fat. This is concerning for malignancy and biopsy recommended. No pharyngeal mass. Direct mucosal inspection recommended. No other enlarged lymph nodes. Chest x-ray is unremarkable ____________________________________________   DIFFERENTIAL DIAGNOSIS   Cellulitis, abscess, malignancy, adenopathy  FINAL ASSESSMENT AND PLAN  Enlarged lymph node, neck pain and swelling   Plan: The patient had presented for left-sided neck pain and swelling related to an enlarged lymph node that is concerning for malignancy. Patient's labs patient did have some leukocytosis and was borderline febrile. Patient's imaging was as dictated above, enlarged lymph node which will need biopsy.  She was given IV Unasyn,  Decadron and Toradol for pain.  I discussed with oncology, who will see her tomorrow in recheck   Laurence Aly, MD    Note: This note was generated in part or whole with voice recognition software. Voice recognition is usually quite accurate but there are transcription errors that can and very often do occur. I apologize for any typographical errors that were not detected and corrected.     Earleen Newport, MD 12/21/18 Joel Mericle    Earleen Newport, MD 12/21/18 1311

## 2018-12-21 NOTE — ED Triage Notes (Signed)
Pt reports when she fell she hurt her left arm not neck.

## 2018-12-22 ENCOUNTER — Encounter: Payer: Self-pay | Admitting: Hematology and Oncology

## 2018-12-22 ENCOUNTER — Inpatient Hospital Stay: Payer: Self-pay

## 2018-12-22 ENCOUNTER — Inpatient Hospital Stay: Payer: Self-pay | Attending: Hematology and Oncology | Admitting: Hematology and Oncology

## 2018-12-22 VITALS — BP 147/89 | HR 96 | Temp 96.6°F | Resp 18 | Ht 63.0 in | Wt 175.0 lb

## 2018-12-22 DIAGNOSIS — Z803 Family history of malignant neoplasm of breast: Secondary | ICD-10-CM | POA: Insufficient documentation

## 2018-12-22 DIAGNOSIS — R5383 Other fatigue: Secondary | ICD-10-CM | POA: Insufficient documentation

## 2018-12-22 DIAGNOSIS — M542 Cervicalgia: Secondary | ICD-10-CM | POA: Insufficient documentation

## 2018-12-22 DIAGNOSIS — R590 Localized enlarged lymph nodes: Secondary | ICD-10-CM

## 2018-12-22 LAB — URIC ACID: Uric Acid, Serum: 6.3 mg/dL (ref 2.5–7.1)

## 2018-12-22 LAB — PROTIME-INR
INR: 1 (ref 0.8–1.2)
Prothrombin Time: 13.3 seconds (ref 11.4–15.2)

## 2018-12-22 LAB — LACTATE DEHYDROGENASE: LDH: 147 U/L (ref 98–192)

## 2018-12-22 LAB — SEDIMENTATION RATE: Sed Rate: 41 mm/hr — ABNORMAL HIGH (ref 0–30)

## 2018-12-22 NOTE — Progress Notes (Signed)
The patient c/o pain to left side of neck.

## 2018-12-24 ENCOUNTER — Other Ambulatory Visit: Payer: Self-pay | Admitting: Otolaryngology

## 2018-12-24 DIAGNOSIS — R221 Localized swelling, mass and lump, neck: Secondary | ICD-10-CM

## 2018-12-28 ENCOUNTER — Inpatient Hospital Stay: Payer: Self-pay | Admitting: Hematology and Oncology

## 2018-12-28 ENCOUNTER — Encounter: Payer: Self-pay | Admitting: Hematology and Oncology

## 2018-12-28 DIAGNOSIS — R590 Localized enlarged lymph nodes: Secondary | ICD-10-CM | POA: Insufficient documentation

## 2018-12-28 NOTE — Progress Notes (Signed)
The patient Name and DOB has been verified by phone today. The patient c/o pain to left side of neck.

## 2018-12-29 ENCOUNTER — Other Ambulatory Visit: Payer: Self-pay | Admitting: Physician Assistant

## 2018-12-29 ENCOUNTER — Other Ambulatory Visit: Payer: Self-pay | Admitting: Radiology

## 2018-12-30 ENCOUNTER — Other Ambulatory Visit: Payer: Self-pay

## 2018-12-30 ENCOUNTER — Ambulatory Visit
Admission: RE | Admit: 2018-12-30 | Discharge: 2018-12-30 | Disposition: A | Discharge status: Home / Self Care | Payer: Self-pay | Source: Ambulatory Visit | Attending: Otolaryngology | Admitting: Otolaryngology

## 2018-12-30 DIAGNOSIS — R221 Localized swelling, mass and lump, neck: Secondary | ICD-10-CM | POA: Insufficient documentation

## 2018-12-30 MED ORDER — MIDAZOLAM HCL 2 MG/2ML IJ SOLN
INTRAMUSCULAR | Status: AC
  Filled 2018-12-30: qty 2

## 2018-12-30 MED ORDER — FENTANYL CITRATE (PF) 100 MCG/2ML IJ SOLN
INTRAMUSCULAR | Status: AC
  Filled 2018-12-30: qty 2

## 2018-12-30 MED ORDER — SODIUM CHLORIDE 0.9 % IV SOLN
INTRAVENOUS | Status: DC
  Administered 2018-12-30: 08:00:00 via INTRAVENOUS

## 2018-12-30 MED ORDER — HYDROCODONE-ACETAMINOPHEN 5-325 MG PO TABS: 1.0000 | ORAL_TABLET | ORAL | Status: DC | PRN

## 2018-12-30 MED ORDER — MIDAZOLAM HCL 2 MG/2ML IJ SOLN
INTRAMUSCULAR | Status: AC | PRN
  Administered 2018-12-30 (×2): 1 mg via INTRAVENOUS

## 2018-12-30 MED ORDER — FENTANYL CITRATE (PF) 100 MCG/2ML IJ SOLN
INTRAMUSCULAR | Status: AC | PRN
  Administered 2018-12-30 (×2): 50 ug via INTRAVENOUS

## 2018-12-30 NOTE — Procedures (Signed)
  Procedure: [place] core biopsy  L cervical mass/LAN   EBL:   minimal Complications:  none immediate  See full dictation in [employer].  Dillard Cannon MD Main # [phone] Pager  [phone]

## 2018-12-30 NOTE — Discharge Instructions (Signed)
Open Lymph Node Biopsy, Care After °This sheet gives you information about how to care for yourself after your procedure. Your health care provider may also give you more specific instructions. If you have problems or questions, contact your health care provider. °What can I expect after the procedure? °After the procedure, it is common to have: °· Bruising. °· Soreness. °· Mild swelling. °Follow these instructions at home: °Medicines °· Take over-the-counter and prescription medicines only as told by your health care provider. °· If you were prescribed an antibiotic medicine, take it as told by your health care provider. Do not stop taking the antibiotic even if you start to feel better. °Incision care ° °· Follow instructions from your health care provider about how to take care of your incision. Make sure you: °? Wash your hands with soap and water before and after you change your bandage (dressing). If soap and water are not available, use hand sanitizer. °? Change your dressing as told by your health care provider. °? Leave stitches (sutures), skin glue, or adhesive strips in place. These skin closures may need to stay in place for 2 weeks or longer. If adhesive strip edges start to loosen and curl up, you may trim the loose edges. Do not remove adhesive strips completely unless your health care provider tells you to do that. °· Check your incision area every day for signs of infection. Check for: °? More redness, swelling, or pain. °? Fluid or blood. °? Warmth. °? Pus or a bad smell. °Driving °· Do not drive for 24 hours if you were given a sedative during your procedure. °· Do not drive or use heavy machinery while taking prescription pain medicine. °General instructions °· Return to your normal activities as told by your health care provider. Ask your health care provider what activities are safe for you. °· Do not take baths, swim, or use a hot tub until your health care provider approves. Ask your health  care provider if you may take showers. You may only be allowed to take sponge baths. °· Keep all follow-up visits as told by your health care provider. This is important. °Contact a health care provider if: °· You have more redness, swelling, or pain around your incision. °· You have fluid or blood coming from your incision. °· Your incision feels warm to the touch. °· You have pus or a bad smell coming from your incision. °· You have a fever. °· You have pain or numbness that gets worse or lasts longer than a few days. °Summary °· After a lymph node biopsy, it is common to have bruising, soreness, and mild swelling. °· Follow your health care provider's instructions about taking care of yourself at home. You will be told how to take medicines, take care of your incision, and check for infection. °· Return to your normal activities as told by your health care provider. Ask your health care provider what activities are safe for you. °· Contact a health care provider if you have more redness, swelling, or pain around your incision, you have a fever, or you have worsening pain or numbness. °This information is not intended to replace advice given to you by your health care provider. Make sure you discuss any questions you have with your health care provider. °Document Released: 02/17/2015 Document Revised: 08/28/2017 Document Reviewed: 08/28/2017 °Elsevier Patient Education © 2020 [employer] ° °

## 2018-12-30 NOTE — Progress Notes (Signed)
Patient clinically stable post LN biopsy per DR Vernard Gambles, tolerated well. Alert and oriented post procedure. bandade dressing dry and intact to left neck area. Denies complaints at this time. Taking po;s without difficulty. Received Versed 2mg  along with Fentanyl 17mcg IV for procedure. Discharge instructions given to patient with questions answered.

## 2019-01-03 NOTE — Progress Notes (Signed)
This encounter was created in error - please disregard.

## 2019-01-05 ENCOUNTER — Encounter: Payer: Self-pay | Admitting: Hematology and Oncology

## 2019-01-05 ENCOUNTER — Inpatient Hospital Stay: Payer: Self-pay | Attending: Hematology and Oncology | Admitting: Hematology and Oncology

## 2019-01-05 ENCOUNTER — Ambulatory Visit: Payer: Self-pay | Admitting: Hematology and Oncology

## 2019-01-05 NOTE — Progress Notes (Signed)
No new changes noted today. The patient Name and DOB has been verified by phone today. 

## 2019-01-14 ENCOUNTER — Inpatient Hospital Stay: Payer: Self-pay | Attending: Hematology and Oncology | Admitting: Hematology and Oncology

## 2019-01-14 ENCOUNTER — Telehealth: Payer: Self-pay

## 2019-01-14 ENCOUNTER — Other Ambulatory Visit: Payer: Self-pay

## 2019-01-14 ENCOUNTER — Other Ambulatory Visit
Admission: RE | Admit: 2019-01-14 | Discharge: 2019-01-14 | Disposition: A | Discharge status: Home / Self Care | Payer: Medicaid Other | Source: Ambulatory Visit | Attending: Otolaryngology | Admitting: Otolaryngology

## 2019-01-14 ENCOUNTER — Encounter
Admission: RE | Admit: 2019-01-14 | Discharge: 2019-01-14 | Disposition: A | Discharge status: Home / Self Care | Payer: Medicaid Other | Source: Ambulatory Visit | Attending: Otolaryngology | Admitting: Otolaryngology

## 2019-01-14 DIAGNOSIS — Z01812 Encounter for preprocedural laboratory examination: Secondary | ICD-10-CM | POA: Insufficient documentation

## 2019-01-14 DIAGNOSIS — Z20828 Contact with and (suspected) exposure to other viral communicable diseases: Secondary | ICD-10-CM | POA: Insufficient documentation

## 2019-01-14 DIAGNOSIS — R590 Localized enlarged lymph nodes: Secondary | ICD-10-CM

## 2019-01-14 DIAGNOSIS — Z20822 Contact with and (suspected) exposure to covid-19: Secondary | ICD-10-CM

## 2019-01-14 NOTE — Patient Instructions (Signed)
Your procedure is scheduled on: mon 12/14 Report to Day Surgery. To find out your arrival time please call [phone] between 1PM - 3PM on Friday 12/13.  Remember: Instructions that are not followed completely may result in serious medical risk,  up to and including death, or upon the discretion of your surgeon and anesthesiologist your  surgery may need to be rescheduled.     _X__ 1. Do not eat food after midnight the night before your procedure.                 No gum chewing or hard candies. You may drink clear liquids up to 2 hours                 before you are scheduled to arrive for your surgery- DO not drink clear                 liquids within 2 hours of the start of your surgery.                 Clear Liquids include:  water, apple juice without pulp, clear carbohydrate                 drink such as Clearfast of Gatorade, Black Coffee or Tea (Do not add                 anything to coffee or tea).  __X__2.  On the morning of surgery brush your teeth with toothpaste and water, you                may rinse your mouth with mouthwash if you wish.  Do not swallow any toothpaste of mouthwash.     ___ 3.  No Alcohol for 24 hours before or after surgery.   ___ 4.  Do Not Smoke or use e-cigarettes For 24 Hours Prior to Your Surgery.                 Do not use any chewable tobacco products for at least 6 hours prior to                 surgery.  ____  5.  Bring all medications with you on the day of surgery if instructed.   _x___  6.  Notify your doctor if there is any change in your medical condition      (cold, fever, infections).     Do not wear jewelry, make-up, hairpins, clips or nail polish. Do not wear lotions, powders, or perfumes. You may wear deodorant. Do not shave 48 hours prior to surgery. Men may shave face and neck. Do not bring valuables to the hospital.    [facility] is not responsible for any belongings or valuables.  Contacts, dentures  or bridgework may not be worn into surgery. Leave your suitcase in the car. After surgery it may be brought to your room. For patients admitted to the hospital, discharge time is determined by your treatment team.   Patients discharged the day of surgery will not be allowed to drive home.   Please read over the following fact sheets that you were given:    ____ Take these medicines the morning of surgery with A SIP OF WATER:    1. none  2.   3.   4.  5.  6.  ____ Fleet Enema (as directed)   __x__ Use CHG Soap as directed  ____ Use inhalers on the day of  surgery  ____ Stop metformin 2 days prior to surgery    ____ Take 1/2 of usual insulin dose the night before surgery. No insulin the morning          of surgery.   ____ Stop Coumadin/Plavix/aspirin on   _x___ Stop Anti-inflammatories no ibuprofen aleve or aspirin until after surgery.  May take tylenol   ____ Stop supplements until after surgery.    ____ Bring C-Pap to the hospital.

## 2019-01-15 LAB — SARS CORONAVIRUS 2 (TAT 6-24 HRS): SARS Coronavirus 2: NEGATIVE

## 2019-01-15 LAB — NOVEL CORONAVIRUS, NAA: SARS-CoV-2, NAA: NOT DETECTED

## 2019-01-18 ENCOUNTER — Ambulatory Visit
Admission: RE | Admit: 2019-01-18 | Discharge: 2019-01-18 | Disposition: A | Discharge status: Home / Self Care | Payer: Medicaid Other | Attending: Otolaryngology | Admitting: Otolaryngology

## 2019-01-18 ENCOUNTER — Encounter
Admission: RE | Disposition: A | Discharge status: Home / Self Care | Payer: Self-pay | Source: Home / Self Care | Attending: Otolaryngology

## 2019-01-18 ENCOUNTER — Ambulatory Visit: Payer: Medicaid Other | Admitting: Registered Nurse

## 2019-01-18 ENCOUNTER — Encounter: Payer: Self-pay | Admitting: Otolaryngology

## 2019-01-18 ENCOUNTER — Other Ambulatory Visit: Payer: Self-pay

## 2019-01-18 DIAGNOSIS — C770 Secondary and unspecified malignant neoplasm of lymph nodes of head, face and neck: Secondary | ICD-10-CM | POA: Diagnosis not present

## 2019-01-18 DIAGNOSIS — R221 Localized swelling, mass and lump, neck: Secondary | ICD-10-CM | POA: Diagnosis present

## 2019-01-18 HISTORY — PX: EXCISION MASS NECK: SHX6703

## 2019-01-18 SURGERY — EXCISION, MASS, NECK
Anesthesia: General | Laterality: Left

## 2019-01-18 MED ORDER — PROPOFOL 10 MG/ML IV BOLUS
INTRAVENOUS | Status: AC
  Filled 2019-01-18: qty 20

## 2019-01-18 MED ORDER — SUCCINYLCHOLINE CHLORIDE 20 MG/ML IJ SOLN
INTRAMUSCULAR | Status: AC
  Filled 2019-01-18: qty 1

## 2019-01-18 MED ORDER — OXYCODONE HCL 5 MG/5ML PO SOLN: 5.0000 mg | Freq: Once | ORAL | Status: DC | PRN

## 2019-01-18 MED ORDER — LACTATED RINGERS IV SOLN
INTRAVENOUS | Status: DC
  Administered 2019-01-18: 07:00:00 via INTRAVENOUS

## 2019-01-18 MED ORDER — PROPOFOL 10 MG/ML IV BOLUS
INTRAVENOUS | Status: DC | PRN
  Administered 2019-01-18: 150 mg via INTRAVENOUS

## 2019-01-18 MED ORDER — FENTANYL CITRATE (PF) 100 MCG/2ML IJ SOLN
INTRAMUSCULAR | Status: DC | PRN
  Administered 2019-01-18 (×2): 50 ug via INTRAVENOUS

## 2019-01-18 MED ORDER — DEXMEDETOMIDINE HCL IN NACL 80 MCG/20ML IV SOLN
INTRAVENOUS | Status: AC
  Filled 2019-01-18: qty 20

## 2019-01-18 MED ORDER — ROCURONIUM BROMIDE 100 MG/10ML IV SOLN
INTRAVENOUS | Status: DC | PRN
  Administered 2019-01-18: 5 mg via INTRAVENOUS

## 2019-01-18 MED ORDER — ONDANSETRON HCL 4 MG/2ML IJ SOLN
INTRAMUSCULAR | Status: DC | PRN
  Administered 2019-01-18: 4 mg via INTRAVENOUS

## 2019-01-18 MED ORDER — SUCCINYLCHOLINE CHLORIDE 20 MG/ML IJ SOLN
INTRAMUSCULAR | Status: DC | PRN
  Administered 2019-01-18: 100 mg via INTRAVENOUS

## 2019-01-18 MED ORDER — MIDAZOLAM HCL 2 MG/2ML IJ SOLN
INTRAMUSCULAR | Status: DC | PRN
  Administered 2019-01-18: 2 mg via INTRAVENOUS

## 2019-01-18 MED ORDER — ROCURONIUM BROMIDE 50 MG/5ML IV SOLN
INTRAVENOUS | Status: AC
  Filled 2019-01-18: qty 1

## 2019-01-18 MED ORDER — PHENYLEPHRINE HCL (PRESSORS) 10 MG/ML IV SOLN
INTRAVENOUS | Status: DC | PRN
  Administered 2019-01-18 (×2): 100 ug via INTRAVENOUS

## 2019-01-18 MED ORDER — LIDOCAINE HCL (CARDIAC) PF 100 MG/5ML IV SOSY
PREFILLED_SYRINGE | INTRAVENOUS | Status: DC | PRN
  Administered 2019-01-18: 100 mg via INTRAVENOUS

## 2019-01-18 MED ORDER — LIDOCAINE-EPINEPHRINE 1 %-1:100000 IJ SOLN
INTRAMUSCULAR | Status: DC | PRN
  Administered 2019-01-18: 5 mL via INTRADERMAL

## 2019-01-18 MED ORDER — FENTANYL CITRATE (PF) 100 MCG/2ML IJ SOLN
INTRAMUSCULAR | Status: AC
  Filled 2019-01-18: qty 2

## 2019-01-18 MED ORDER — DEXAMETHASONE SODIUM PHOSPHATE 10 MG/ML IJ SOLN
INTRAMUSCULAR | Status: AC
  Filled 2019-01-18: qty 1

## 2019-01-18 MED ORDER — OXYCODONE HCL 5 MG PO TABS: 5.0000 mg | ORAL_TABLET | Freq: Once | ORAL | Status: DC | PRN

## 2019-01-18 MED ORDER — LIDOCAINE-EPINEPHRINE 1 %-1:100000 IJ SOLN
INTRAMUSCULAR | Status: AC
  Filled 2019-01-18: qty 1

## 2019-01-18 MED ORDER — DEXMEDETOMIDINE HCL 200 MCG/2ML IV SOLN
INTRAVENOUS | Status: DC | PRN
  Administered 2019-01-18: 4 ug via INTRAVENOUS
  Administered 2019-01-18 (×2): 8 ug via INTRAVENOUS

## 2019-01-18 MED ORDER — FAMOTIDINE 20 MG PO TABS
ORAL_TABLET | ORAL | Status: AC
  Administered 2019-01-18: 20 mg via ORAL
  Filled 2019-01-18: qty 1

## 2019-01-18 MED ORDER — MIDAZOLAM HCL 2 MG/2ML IJ SOLN
INTRAMUSCULAR | Status: AC
  Filled 2019-01-18: qty 2

## 2019-01-18 MED ORDER — PHENYLEPHRINE HCL (PRESSORS) 10 MG/ML IV SOLN
INTRAVENOUS | Status: AC
  Filled 2019-01-18: qty 1

## 2019-01-18 MED ORDER — BACITRACIN ZINC 500 UNIT/GM EX OINT
TOPICAL_OINTMENT | CUTANEOUS | Status: AC
  Filled 2019-01-18: qty 28.35

## 2019-01-18 MED ORDER — FAMOTIDINE 20 MG PO TABS
ORAL_TABLET | ORAL | Status: AC
  Filled 2019-01-18: qty 1

## 2019-01-18 MED ORDER — ONDANSETRON HCL 4 MG/2ML IJ SOLN
INTRAMUSCULAR | Status: AC
  Filled 2019-01-18: qty 2

## 2019-01-18 MED ORDER — DEXAMETHASONE SODIUM PHOSPHATE 10 MG/ML IJ SOLN
INTRAMUSCULAR | Status: DC | PRN
  Administered 2019-01-18: 8 mg via INTRAVENOUS

## 2019-01-18 MED ORDER — FAMOTIDINE 20 MG PO TABS: 20.0000 mg | ORAL_TABLET | Freq: Once | ORAL | Status: AC

## 2019-01-18 MED ORDER — LIDOCAINE HCL (PF) 2 % IJ SOLN
INTRAMUSCULAR | Status: AC
  Filled 2019-01-18: qty 10

## 2019-01-18 MED ORDER — KETOROLAC TROMETHAMINE 30 MG/ML IJ SOLN
INTRAMUSCULAR | Status: DC | PRN
  Administered 2019-01-18: 30 mg via INTRAVENOUS

## 2019-01-18 MED ORDER — FENTANYL CITRATE (PF) 100 MCG/2ML IJ SOLN: 25.0000 ug | INTRAMUSCULAR | Status: DC | PRN

## 2019-01-18 MED ORDER — HYDROCODONE-ACETAMINOPHEN 5-325 MG PO TABS: 1.0000 | ORAL_TABLET | Freq: Four times a day (QID) | ORAL | 0 refills | Status: AC | PRN

## 2019-01-18 SURGICAL SUPPLY — 46 items
BLADE SURG 15 STRL LF DISP TIS (BLADE) ×1 IMPLANT
BLADE SURG 15 STRL SS (BLADE) ×1
CANISTER SUCT 1200ML W/VALVE (MISCELLANEOUS) ×2 IMPLANT
CORD BIP STRL DISP 12FT (MISCELLANEOUS) ×2 IMPLANT
COVER WAND RF STERILE (DRAPES) ×2 IMPLANT
DRAIN TLS ROUND 10FR (DRAIN) IMPLANT
DRAPE MAG INST 16X20 L/F (DRAPES) IMPLANT
DRSG TEGADERM 4X4.75 (GAUZE/BANDAGES/DRESSINGS) ×2 IMPLANT
DRSG TELFA 4X3 1S NADH ST (GAUZE/BANDAGES/DRESSINGS) ×2 IMPLANT
ELECT CAUTERY BLADE TIP 2.5 (TIP) ×2
ELECT EMG 20MM DUAL (MISCELLANEOUS) ×4
ELECT NEEDLE 20X.3 GREEN (MISCELLANEOUS)
ELECT REM PT RETURN 9FT ADLT (ELECTROSURGICAL) ×2
ELECTRODE CAUTERY BLDE TIP 2.5 (TIP) ×1 IMPLANT
ELECTRODE EMG 20MM DUAL (MISCELLANEOUS) ×2 IMPLANT
ELECTRODE NEEDLE 20X.3 GREEN (MISCELLANEOUS) IMPLANT
ELECTRODE REM PT RTRN 9FT ADLT (ELECTROSURGICAL) ×1 IMPLANT
FORCEPS JEWEL BIP 4-3/4 STR (INSTRUMENTS) ×2 IMPLANT
GAUZE 4X4 16PLY RFD (DISPOSABLE) ×2 IMPLANT
GLOVE BIO SURGEON STRL SZ7.5 (GLOVE) ×2 IMPLANT
GLOVE PROTEXIS LATEX SZ 7.5 (GLOVE) ×2 IMPLANT
GOWN STRL REUS W/ TWL LRG LVL3 (GOWN DISPOSABLE) ×3 IMPLANT
GOWN STRL REUS W/TWL LRG LVL3 (GOWN DISPOSABLE) ×3
HOOK STAY BLUNT/RETRACTOR 5M (MISCELLANEOUS) ×2 IMPLANT
KIT TURNOVER KIT A (KITS) ×2 IMPLANT
LABEL OR SOLS (LABEL) ×2 IMPLANT
MARKER SKIN DUAL TIP RULER LAB (MISCELLANEOUS) ×2 IMPLANT
NS IRRIG 500ML POUR BTL (IV SOLUTION) ×2 IMPLANT
PACK HEAD/NECK (MISCELLANEOUS) ×2 IMPLANT
PROBE MONO 100X0.75 ELECT 1.9M (MISCELLANEOUS) ×2 IMPLANT
SHEARS HARMONIC 9CM CVD (BLADE) ×2 IMPLANT
SPONGE KITTNER 5P (MISCELLANEOUS) ×2 IMPLANT
SUCTION FRAZIER HANDLE 10FR (MISCELLANEOUS)
SUCTION TUBE FRAZIER 10FR DISP (MISCELLANEOUS) IMPLANT
SUT PROLENE 6 0 PC 1 (SUTURE) ×2 IMPLANT
SUT SILK 2 0 (SUTURE) ×1
SUT SILK 2-0 18XBRD TIE 12 (SUTURE) ×1 IMPLANT
SUT SILK 3 0 REEL (SUTURE) ×2 IMPLANT
SUT SILK 3-0 (SUTURE) ×1
SUT SILK 3-0 SH-1 18XCR BRD (SUTURE) ×1
SUT SILK 4 0 (SUTURE) ×1
SUT SILK 4-0 18XBRD TIE 12 (SUTURE) ×1 IMPLANT
SUT VIC AB 4-0 BRD 54 (SUTURE) ×2 IMPLANT
SUT VIC AB 4-0 FS2 27 (SUTURE) ×4 IMPLANT
SUTURE SILK 3-0 SH-1 18XCR BRD (SUTURE) ×1 IMPLANT
SYSTEM CHEST DRAIN TLS 7FR (DRAIN) IMPLANT

## 2019-01-18 NOTE — Anesthesia Postprocedure Evaluation (Signed)
Anesthesia Post Note  Patient: Connie Cameron  Procedure(s) Performed: EXCISION MASS NECK/NODE (Left )  Patient location during evaluation: PACU Anesthesia Type: General Level of consciousness: awake and alert Pain management: pain level controlled Vital Signs Assessment: post-procedure vital signs reviewed and stable Respiratory status: spontaneous breathing, nonlabored ventilation, respiratory function stable and patient connected to nasal cannula oxygen Cardiovascular status: blood pressure returned to baseline and stable Postop Assessment: no apparent nausea or vomiting Anesthetic complications: no     Last Vitals:  Vitals:   01/18/19 0935 01/18/19 0953  BP: 124/81 115/83  Pulse: 80 74  Resp: 16 16  Temp: (!) 36.3 C 36.4 C  SpO2: 99% 99%    Last Pain:  Vitals:   01/18/19 0953  TempSrc: Temporal  PainSc: 0-No pain                 Precious Haws Ryder Chesmore

## 2019-01-18 NOTE — Op Note (Signed)
01/18/2019  8:39 AM    Wallace Cullens  BB:[id number]    Pre-Op Dx: Left deep neck mass  Post-op Dx: Left deep neck mass   Proc: Excision left deep neck mass  Surg:  Huey Romans              assist: Floyce Stakes  Anes:  GOT  EBL: 20 mL  Comp: None  Findings: The mass was firm and oval, well encapsulated.  It had loose tissue around it and was mobile.  It had partially behind the sternocleidomastoid muscle.  It looked like a single mass rather than nodes matted together.   Procedure: The patient was brought to the operating room and given general anesthesia by oral endotracheal intubation.  When she was asleep her head was turned to the right side to expose the left neck mass.  It was about halfway between the clavicle and the mandible.  It was mobile and the posterior part seemed to hide behind the sternocleidomastoid.  An incision line was marked in a horizontal fashion following a skin crease in the neck.  5 mL of 1% Xylocaine with epi 1: 100,000 was used for infiltration in the skin and subcu area.  She was prepped and draped in sterile fashion.  Incision was made through the skin and into the subcu.  This was divided down to the platysma muscle.  The platysma was divided to expose the deep tissues of the neck.  The anterior and inferior border of the mass was then evident.  There is loose areolar and fibrous tissue around it.  This was lifted up off of the mass to separated.  As the anterior attachments were freed up it could be lifted partly out of the wound.  The medial attachments were right next to a vein coming from the parotid into the neck.  This was separated and left intact.  The deep attachments the more posterior and of the mass were then freed up and the entire mass was then separated from the wound.  This is gone underneath the sternocleidomastoid posteriorly.  The wound was copiously irrigated and there was no sign of bleeding at all.  The harmonic scalpel was used  for the dissection to help control bleeding as we went and there is very minimal blood loss throughout the procedure.  The strap muscles were then closed and no drain was used.  This was sutured with 4-0 Vicryls interrupted.  The dermis was then closed with 4-0 interrupted Vicryls as well.  The skin edges were held in apposition with a 6-0 running locking Prolene suture.  The wound was covered with bacitracin then Telfa and a Tegaderm.  The patient tolerated the procedure well.  She was awakened and taken to recovery room in satisfactory condition.  There were no operative complications.  Dispo:   To PACU to be discharged home  Plan: To follow-up in the office in 1 week for suture removal.  We will await pathology and discussed that with her at that time.  She can eat as tolerated but will rest at home with her head elevated for the next couple days.  Elon Alas Jazsmine Macari  01/18/2019 8:39 AM

## 2019-01-18 NOTE — Transfer of Care (Signed)
Immediate Anesthesia Transfer of Care Note  Patient: Connie Cameron  Procedure(s) Performed: EXCISION MASS NECK/NODE (Left )  Patient Location: PACU  Anesthesia Type:General  Level of Consciousness: sedated  Airway & Oxygen Therapy: Patient Spontanous Breathing and Patient connected to face mask oxygen  Post-op Assessment: Report given to RN and Post -op Vital signs reviewed and stable  Post vital signs: Reviewed and stable  Last Vitals:  Vitals Value Taken Time  BP 115/73 01/18/19 0847  Temp    Pulse 84 01/18/19 0848  Resp 15 01/18/19 0848  SpO2 98 % 01/18/19 0848  Vitals shown include unvalidated device data.  Last Pain:  Vitals:   01/18/19 0634  TempSrc: Temporal  PainSc: 3          Complications: No apparent anesthesia complications

## 2019-01-18 NOTE — Discharge Instructions (Signed)

## 2019-01-18 NOTE — Anesthesia Post-op Follow-up Note (Signed)
Anesthesia QCDR form completed.        

## 2019-01-18 NOTE — Anesthesia Procedure Notes (Signed)
Procedure Name: Intubation Date/Time: 01/18/2019 7:32 AM Performed by: Hedda Slade, CRNA Pre-anesthesia Checklist: Patient identified, Patient being monitored, Timeout performed, Emergency Drugs available and Suction available Patient Re-evaluated:Patient Re-evaluated prior to induction Oxygen Delivery Method: Circle system utilized Preoxygenation: Pre-oxygenation with 100% oxygen Induction Type: IV induction Ventilation: Mask ventilation without difficulty Laryngoscope Size: 3 and McGraph Grade View: Grade I Tube type: Oral Tube size: 7.0 mm Number of attempts: 1 Airway Equipment and Method: Stylet Placement Confirmation: ETT inserted through vocal cords under direct vision,  positive ETCO2 and breath sounds checked- equal and bilateral Secured at: 21 cm Tube secured with: Tape Dental Injury: Teeth and Oropharynx as per pre-operative assessment

## 2019-01-18 NOTE — Anesthesia Preprocedure Evaluation (Signed)
Anesthesia Evaluation  Patient identified by MRN, date of birth, ID band Patient awake    Reviewed: Allergy & Precautions, H&P , NPO status , Patient's Chart, lab work & pertinent test results  History of Anesthesia Complications Negative for: history of anesthetic complications  Airway Mallampati: III  TM Distance: >3 FB Neck ROM: full    Dental  (+) Chipped   Pulmonary neg pulmonary ROS, neg shortness of breath,           Cardiovascular Exercise Tolerance: Good (-) angina(-) Past MI negative cardio ROS       Neuro/Psych negative neurological ROS  negative psych ROS   GI/Hepatic negative GI ROS, Neg liver ROS, neg GERD  ,  Endo/Other  negative endocrine ROS  Renal/GU      Musculoskeletal   Abdominal   Peds  Hematology negative hematology ROS (+)   Anesthesia Other Findings Past Medical History: No date: Medical history non-contributory  Past Surgical History: No date: TONSILLECTOMY  BMI    Body Mass Index: 27.46 kg/m      Reproductive/Obstetrics negative OB ROS                             Anesthesia Physical Anesthesia Plan  ASA: I  Anesthesia Plan: General ETT   Post-op Pain Management:    Induction: Intravenous  PONV Risk Score and Plan: Ondansetron, Dexamethasone, Midazolam and Treatment may vary due to age or medical condition  Airway Management Planned: Oral ETT  Additional Equipment:   Intra-op Plan:   Post-operative Plan: Extubation in OR  Informed Consent: I have reviewed the patients History and Physical, chart, labs and discussed the procedure including the risks, benefits and alternatives for the proposed anesthesia with the patient or authorized representative who has indicated his/her understanding and acceptance.     Dental Advisory Given  Plan Discussed with: Anesthesiologist, CRNA and Surgeon  Anesthesia Plan Comments: (Patient consented for  risks of anesthesia including but not limited to:  - adverse reactions to medications - damage to teeth, lips or other oral mucosa - sore throat or hoarseness - Damage to heart, brain, lungs or loss of life  Patient voiced understanding.)        Anesthesia Quick Evaluation

## 2019-01-18 NOTE — H&P (Signed)
H&P has been reviewed and patient reevaluated, no changes necessary. To be downloaded later.  

## 2019-01-19 LAB — SURGICAL PATHOLOGY

## 2019-01-27 ENCOUNTER — Telehealth: Payer: Self-pay | Admitting: Hematology and Oncology

## 2019-01-27 LAB — SURGICAL PATHOLOGY

## 2019-01-28 ENCOUNTER — Other Ambulatory Visit: Payer: Self-pay | Admitting: Hematology and Oncology

## 2019-01-28 DIAGNOSIS — C760 Malignant neoplasm of head, face and neck: Secondary | ICD-10-CM

## 2019-01-31 NOTE — Progress Notes (Signed)
This encounter was created in error - please disregard.

## 2019-01-31 NOTE — Progress Notes (Deleted)
[facility]  [address], [address] [place], [place] [address] Phone: [phone]  Fax: [phone]   Telemedicine Office Visit:  01/31/2019  Referring physician: No ref. provider found  I connected with [name] on 01/31/2019 at 11:19 PM by videoconferencing and verified that I was speaking with the correct person using 2 identifiers.  The patient was at *** home.  I discussed the limitations, risk, security and privacy concerns of performing an evaluation and management service by videoconferencing and the availability of in person appointments.  I also discussed with the patient that there may be a patient responsible charge related to this service.  The patient expressed understanding and agreed to proceed.   Chief Complaint: Connie Cameron is a 58 y.o. 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   HPI: The patient was last seen in the medical oncology clinic on 12/22/2018 for an initial consult. At that time, she noted increasing tender left cervical adenopathy.  She was feeling better after initiation of antibiotics and steroids.  Exam revealed a 3.5 x 2.0 cm tender high left anterior cervical lymph node.  There was no obvious localizing infection. We discussed a possible core needle biopsy of lymph node if adenopathy did not resolve. Consult with Dr. Kathyrn Sheriff, ENT was scheduled.   Work up revealed a sed rate of 41, uric acid 6.3, and LDH 147. INR was 1.0.  She was seen in consultation by Dr Margaretha Sheffield, ENT on 12/22/2018.  Exam revealed a 3 cm firm level III tender mobile mass in the left mid neck.  Ultrasound core biopsy of left cervical mass/adenopathy on 12/30/2018 revealed clusters of atypical cells, cannot exclude malignancy.  There is an abnormal population of larger cells, which are positive for S100. They were negative for CD3, CD20, Pax-5,  CD15, CD30, CD45, Melan-A, and SOX10. Pancytokeratin is negative.  Excisional biopsy was recommended.  She underwent excisional biopsy of the left neck mass on 01/18/2019.  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  Pathology sent to Dr Haze Rushing Askin at [facility].  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   ENT exam revealed concern for a 1.5 cm left base of tongue lesion.  PET scan on 02/04/2019 revealed prior left level II cervical node had been excised.  There were no findings on PET to suggest a site of primary head/neck cancer.  There was symmetric hypermetabolism along the base of the tongue bilaterally favored to be physiologic.  There was no evidence of metastatic disease.  She was presented at tumor board on 02/04/2019.  Decision was made to proceed with ENT biopsies then concurrent chemotherapy and radiation.  During the interim, the patient ***   Past Medical History:  Diagnosis Date  . Medical history non-contributory     Past Surgical History:  Procedure Laterality Date  . EXCISION MASS NECK Left 01/18/2019   Procedure: EXCISION MASS NECK/NODE;  Surgeon: Margaretha Sheffield, MD;  Location: [facility] ORS;  Service: ENT;  Laterality: Left;  . TONSILLECTOMY      Family History  Problem Relation Age of Onset  . Breast cancer Sister   . Brain cancer Paternal Grandfather     Social History:  reports that she has never smoked. She has never used smokeless tobacco.  She reports previous alcohol use. She reports that she does not use drugs.  She use to drink years ago. She denies any exposure to radiation or toxins. 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  She is currently not working.  She lives in [place]. The patient is ***alone/accompanied by *** today.  Participants in the patient's visit  and their role in the encounter included the patient, ***, and Waymon Budge, RN or Vito Berger, CMA, today.  The intake visit was provided by *** Waymon Budge, RN or Vito Berger, [place].  Allergies: No Known Allergies  Current Medications: No current outpatient medications on file.   No current facility-administered medications for this visit.    Review of Systems  Constitutional: Negative for chills, diaphoresis, fever, malaise/fatigue and weight loss.       Feels "kind of fatigued".  HENT: Positive for ear pain. Negative for congestion, hearing loss, nosebleeds, sinus pain, sore throat and tinnitus.        No dental issues.  Eyes: Positive for blurred vision (has not seen opthalmologist in some time). Negative for double vision and photophobia.  Respiratory: Negative.  Negative for cough, hemoptysis, sputum production and shortness of breath.   Cardiovascular: Negative.  Negative for chest pain, palpitations and leg swelling.  Gastrointestinal: Negative.  Negative for abdominal pain, blood in stool, constipation, diarrhea, melena, nausea and vomiting.  Genitourinary: Negative.  Negative for dysuria, frequency, hematuria and urgency.  Musculoskeletal: Positive for neck pain (left neck; increased pain x 4 days). Negative for back pain, joint pain and myalgias.       Left neck swollen > 1 year.  Skin: Negative.  Negative for itching and rash.  Neurological: Negative.  Negative for dizziness, tingling, sensory change, focal weakness, weakness and headaches.  Endo/Heme/Allergies: Negative.  Does not bruise/bleed easily.  Psychiatric/Behavioral: Negative for depression and memory loss. The patient is nervous/anxious. The patient does not have insomnia.   All other systems reviewed and are negative.   Performance status (ECOG): 1  Physical Exam  Constitutional: She is oriented to person, place, and time. She appears well-developed and well-nourished. No distress. Face  mask in place.  HENT:  Head: Normocephalic and atraumatic. Hair is normal (calp unremarkable).  Right Ear: Tympanic membrane and ear canal normal.  Left Ear: Tympanic membrane and ear canal normal.  Mouth/Throat: Oropharynx is clear and moist. No oropharyngeal exudate.  Long gray blonde hair pulled in ponytail.  No dental issues.  S/p tonsillectomy.  No pain on clenching teeth.  Left TM slightly dull, but without bulging or erythema.  Eyes: Pupils are equal, round, and reactive to light. Conjunctivae and EOM are normal. No scleral icterus.  Cardiovascular: Normal rate and normal heart sounds.  No murmur heard. Pulmonary/Chest: Effort normal and breath sounds normal. No respiratory distress. She has no wheezes. She has no rales.  Abdominal: Soft. Bowel sounds are normal. She exhibits no distension and no mass. There is no abdominal tenderness. There is no rebound and no guarding.  Normal spleen.  Musculoskeletal:        General: No tenderness or edema. Normal range of motion.     Cervical back: Normal range of motion and neck supple.  Lymphadenopathy:       Head (right side): No preauricular, no posterior auricular and no occipital adenopathy present.       Head (left side): No preauricular, no posterior auricular and no occipital adenopathy present.    She has cervical adenopathy (left ).  She has no axillary adenopathy.       Right: No supraclavicular adenopathy present.       Left: No supraclavicular adenopathy present.  Egg shaped 3.5 x 2.0 cm tender high left anterior cervical lymph node.   Neurological: She is alert and oriented to person, place, and time.  Skin: Skin is warm and dry. No rash noted. She is not diaphoretic. No erythema. No pallor.  Right neck and lower back tattoo.  Psychiatric: She has a normal mood and affect. Her behavior is normal. Judgment and thought content normal.  Nursing note and vitals reviewed.   No visits with results within 3 Day(s) from this visit.   Latest known visit with results is:  Admission on 01/18/2019, Discharged on 01/18/2019  Component Date Value Ref Range Status  . SURGICAL PATHOLOGY 01/18/2019    Final-Edited                   Value:SURGICAL PATHOLOGY CASE: [id number] PATIENT: Cora Aguinaldo Surgical Pathology Report  Specimen Submitted: A. Neck mass, left deep  Clinical History: 58 year old female with neck mass  DIAGNOSIS: A. NECK MASS, LEFT DEEP; EXCISION: - LYMPH NODE TISSUE WITH METASTATIC P16 POSITIVE POORLY DIFFERENTIATED CARCINOMA, SEE COMMENT.  Comment: Sections demonstrate lymphoid tissue with multifocal, non-confluent areas of poorly differentiated neoplastic tissue with focal cystic change. A panel of immunohistochemical stains was performed with the following pattern of immunoreactivity: Super pancytokeratin (AE1/AE3/CAM 5.2): Positive (spindle epithelioid cells) p16: Positive (performed and interpreted at [facility]) S100: Positive (cells with clear cytoplasm that are admixed with spindle epithelioid component) CD68: Positive (cells with clear cytoplasm that are admixed with spindle epithelioid component) HMB-45: Negative Melan-A: Negative CD23: Negative TTF-1:                          Negative GATA-3: Negative (highlights background lymphocytes) CDX-2: Negative PAX-8: Negative (highlights background lymphocytes) WT-1: Negative ER: Negative EMA: Focal positive (spindle epithelioid cells) CD34: Negative BCL-2: Focal positive (spindle epithelioid cells) CK7: Negative CK20: Negative Desmin: Negative CD1a: Patchy CD3: Negative (highlights background and admixed lymphocytes) CD5: Negative (highlights background and admixed lymphocytes) CD20: Negative (highlights background and admixed lymphocytes) CD21: Negative CD35: Negative EBER: Negative (performed and interpreted at [facility]) Given the unusual morphologic features this case was sent to Dr. Haze Rushing Askin at [facility] for external consultation.  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 The submitted specimen likely represents more than one matt                         ed lymph node. There is sufficient material for ancillary molecular studies if needed. The final results were communicated to Drs. Beuna Bolding and Juengel via [facility] secure text on 01/27/2019.  CD1a, CD21, and CD35 IHC slides were prepared by [place] for [employer], [place], [place]. All the remaining IHC slides with the exception of EBER and p16 were prepared by [facility] for [facility], [place], [place].  All controls stained appropriately.  This test was developed and its performance characteristics determined by [facility]. It has not been cleared or approved by the [place] Food and Drug Administration. The FDA does not require this test to go through premarket FDA review. This test is used for clinical purposes. It should not be regarded as investigational or for research.  This laboratory is certified under the Clinical Laboratory Improvement Amendments (CLIA) as qualified to perform high complexity clinical laboratory testing.    GROSS DESCRIPTION: A. Labeled: Left deep neck mass Received: In formalin Integrity: Intact Orientation: Unoriented Tissue fragment(s): 1 Size: 4.0 x 2.3 x 1.8 cm Weight: 12 g Description: Oval rubbery soft mass covered by intact smooth shiny tiny capsule measuring less than 0.1 cm in thickness.  The specimen is entirely inked.  Serial sections demonstrate multinodular pink fleshy cut surface with multicystic changes Entirely submitted in 9 cassettes.  Final Diagnosis performed by Quay Burow, MD.   Electronically signed 01/27/2019 3:06:11PM The electronic signature indicates that the named Attending Pathologist has evaluated  the specimen Technical component performed at [facility], [address], [place], [place] [address] Lab: [phone] Dir: Rush Farmer, MD, MMM  Professional component performed at [facility], [facility], [place], [place], [place] [address] Lab: [phone] Dir: Dellia Nims. Rubinas, MD    Assessment:  ARELYN SWOFFORD is a 58 y.o. female with clinical stage TxN1M0 head and neck carcinoma of unknown origin s/p excisional biopsy of the left neck mass on 01/18/2019.  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  She presented with left cervical adenopathy.  Neck CT on 12/21/2018 revealed a 2.4 x 2.1 x 3.4 cm lymph node in the left upper neck.  The lymph node showed heterogeneous enhancement and stranding in the surrounding fat. There was a 6 mm left upper posterior lymph node and a 7 mm right level 3 lymph node.  Imaging was concerning for malignancy.  There were no other enlarged lymph nodes.  There was no pharyngeal mass.   PET scan on 02/04/2019 revealed prior left level II cervical node had been excised.  There were no findings on PET to suggest a site of primary head/neck cancer.  There was symmetric hypermetabolism along the base of the tongue bilaterally favored to be physiologic.  There was no evidence of metastatic disease.  Symptomatically, ***  Plan: 1.   Discuss direction of therapy.   2.   Stage IVa head and neck cancer              Patient has a > 1 year history of left neck adenopathy.             She has had recent increased swelling and tenderness clinically suspicious for adenitis.             She denies any B symptoms (although temperature noted in ER yesterday).             Exam reveals no other adenopathy.             Discuss plan for ENT referral for evaluation.             Discuss completion of antibiotic course and re-evaluation.             Discuss possible core needle biopsy of  lymph node if adenopathy does not resolve. 3.   Consult Dr Kathyrn Sheriff, ENT (called) - patient to be seen today to tomorrow. 4.   RTC in 1 week for MD assessment and discussion regarding direction of therapy.    I discussed the assessment and treatment plan with the patient.  The patient was provided an opportunity to ask questions and all were answered.  The patient agreed with the plan and demonstrated an understanding of the instructions.  The patient was advised to  call back or seek an in person evaluation if the symptoms worsen or if the condition fails to improve as anticipated.  I provided *** minutes (11:19 PM - X:XX) of face-to-face video visit time during this this encounter and > 50% was spent counseling as documented under my assessment and plan.  I provided these services from the [facility] office ***.   Nolon Stalls, MD, PhD  01/31/2019, 11:19 PM  I, Selena Batten, am acting as scribe for [employer]. Mike Gip, MD, PhD.  {Add scribe attestation statement}

## 2019-02-01 ENCOUNTER — Ambulatory Visit: Payer: Self-pay | Admitting: Hematology and Oncology

## 2019-02-04 ENCOUNTER — Encounter
Admission: RE | Admit: 2019-02-04 | Discharge: 2019-02-04 | Disposition: A | Discharge status: Home / Self Care | Payer: Medicaid Other | Source: Ambulatory Visit | Attending: Hematology and Oncology | Admitting: Hematology and Oncology

## 2019-02-04 ENCOUNTER — Other Ambulatory Visit: Payer: Self-pay

## 2019-02-04 ENCOUNTER — Other Ambulatory Visit: Payer: Self-pay | Admitting: Hematology and Oncology

## 2019-02-04 DIAGNOSIS — C760 Malignant neoplasm of head, face and neck: Secondary | ICD-10-CM | POA: Diagnosis not present

## 2019-02-04 DIAGNOSIS — C01 Malignant neoplasm of base of tongue: Secondary | ICD-10-CM

## 2019-02-04 LAB — GLUCOSE, CAPILLARY: Glucose-Capillary: 122 mg/dL — ABNORMAL HIGH (ref 70–99)

## 2019-02-04 MED ORDER — FLUDEOXYGLUCOSE F - 18 (FDG) INJECTION
8.0000 | Freq: Once | INTRAVENOUS | Status: AC | PRN
  Administered 2019-02-04: 8.27 via INTRAVENOUS

## 2019-02-04 NOTE — Progress Notes (Signed)
Tumor Board Documentation  Connie Cameron was presented by Dr Mike Gip at our Tumor Board on 02/04/2019, which included representatives from medical oncology, radiation oncology, navigation, pathology, radiology, surgical, surgical oncology, internal medicine, pharmacy, genetics, research, palliative care.  Connie Cameron currently presents as a current patient, for [place] with history of the following treatments: active survellience, surgical intervention(s).  Additionally, we reviewed previous medical and familial history, history of present illness, and recent lab results along with all available histopathologic and imaging studies. The tumor board considered available treatment options and made the following recommendations: Biopsy, Concurrent chemo-radiation therapy    The following procedures/referrals were also placed: No orders of the defined types were placed in this encounter.   Clinical Trial Status: not discussed   Staging used: Clinical Stage  AJCC Staging: T: 0 N: 1   Group: Metastatic Squamous Cell Carcinoma in lymph Node  National site-specific guidelines   were discussed with respect to the case.  Tumor board is a meeting of clinicians from various specialty areas who evaluate and discuss patients for whom a multidisciplinary approach is being considered. Final determinations in the plan of care are those of the provider(s). The responsibility for follow up of recommendations given during tumor board is that of the provider.   Today's extended care, comprehensive team conference, Connie Cameron was not present for the discussion and was not examined.   Multidisciplinary Tumor Board is a multidisciplinary case peer review process.  Decisions discussed in the Multidisciplinary Tumor Board reflect the opinions of the specialists present at the conference without having examined the patient.  Ultimately, treatment and diagnostic decisions rest with the primary provider(s) and the  patient.

## 2019-02-05 DIAGNOSIS — C140 Malignant neoplasm of pharynx, unspecified: Secondary | ICD-10-CM

## 2019-02-05 HISTORY — DX: Malignant neoplasm of pharynx, unspecified: C14.0

## 2019-02-08 ENCOUNTER — Inpatient Hospital Stay: Payer: Self-pay | Admitting: Hematology and Oncology

## 2019-02-08 ENCOUNTER — Inpatient Hospital Stay: Payer: Self-pay | Attending: Hematology and Oncology | Admitting: Hematology and Oncology

## 2019-02-08 ENCOUNTER — Encounter: Payer: Self-pay | Admitting: Hematology and Oncology

## 2019-02-08 ENCOUNTER — Encounter: Payer: Self-pay | Admitting: Otolaryngology

## 2019-02-08 ENCOUNTER — Other Ambulatory Visit: Payer: Self-pay

## 2019-02-08 DIAGNOSIS — C760 Malignant neoplasm of head, face and neck: Secondary | ICD-10-CM

## 2019-02-08 DIAGNOSIS — C4442 Squamous cell carcinoma of skin of scalp and neck: Secondary | ICD-10-CM | POA: Insufficient documentation

## 2019-02-08 NOTE — Progress Notes (Signed)
No new changes noted today 

## 2019-02-08 NOTE — Progress Notes (Signed)
[facility]  [address], [address] [place], [place] [address] Phone: [phone]  Fax: [phone]   Telephone Office Visit:  02/22/2019  Referring physician: No ref. provider found  I connected with Connie Cameron on 02/07/2018 at 12:13 PM EDT by telephone and verified that I was speaking with the correct person using 2 identifiers.  The patient was at [facility] in [place].  I discussed the limitations, risk, security and privacy concerns of performing an evaluation and management service by telephone and the availability of in person appointments.  I also discussed with the patient that there may be a patient responsible charge related to this service.  The patient expressed understanding and agreed to proceed.   Chief Complaint: Connie Cameron is a 59 y.o. 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   HPI: The patient was last seen in the medical oncology clinic on 12/22/2018 for initial consultation.  She noted tender left cervical adenopathy. She was feeling better after initiation of antibiotics and steroids.  Exam revealed a 3.5 x 2.0 cm tender high left anterior cervical lymph node. There was no obvious localizing infection. We discussed a possible core needle biopsy of lymph node if adenopathy did not resolve. Consult with Dr. Kathyrn Sheriff, ENT was scheduled.   Work up revealed a sed rate of 41, uric acid 6.3, and LDH 147. INR was 1.0.   She was seen in consultation by Dr Margaretha Sheffield, ENT on 12/22/2018. Exam revealed a 3 cm firm level III tender mobile mass in the left mid neck. Ultrasound core biopsy of left cervical mass/adenopathy on 12/30/2018 revealed clusters of atypical cells, cannot exclude malignancy. There was an abnormal population of larger cells, which were positive for S100. They were negative for CD3, CD20, Pax-5, CD15, CD30, CD45, Melan-A,  and SOX10. Pancytokeratin was negative. Excisional biopsy was recommended.   She underwent excisional biopsy of the left neck mass on 01/18/2019. 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 Pathology was sent to Dr Haze Rushing Askin at [facility] for second opinion. 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   ENT exam revealed concern for a 1.5 cm left base of tongue lesion.   PET scan on 02/04/2019 revealed that the prior left level II cervical node had been excised. There were no findings on PET to suggest a site of primary head/neck cancer. There was symmetric hypermetabolism along the base of the tongue bilaterally favored to be physiologic. There was no evidence of metastatic disease.  She was presented at tumor board on 02/04/2019. Decision was made to proceed with ENT biopsies then concurrent chemotherapy and radiation.   During the interim, the patient she has felt fine.  She has had no new changes.  Her vision is still the same.  Ear pain resolved.  She denies any difficulties with swallowing or sore throat.  She has an appointment with Dr. Kathyrn Sheriff today at 1 PM.   Past Medical History:  Diagnosis Date   Medical history non-contributory     Past Surgical History:  Procedure Laterality Date   EXCISION MASS NECK Left 01/18/2019   Procedure: EXCISION MASS NECK/NODE;  Surgeon: Margaretha Sheffield, MD;  Location: [facility] ORS;  Service: ENT;  Laterality: Left;   TONSILLECTOMY  TUBAL LIGATION      Family History  Problem Relation Age of Onset   Breast cancer Sister    Brain cancer Paternal Grandfather     Social History:  reports that she has never smoked. She has never used smokeless tobacco. She reports previous alcohol use. She reports that she does not use drugs.  She used to drink years ago. She  denies any exposure to radiation or toxins. 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 She lives in [place]. The patient is alone today.   Participants in the patient's visit and their role in the encounter included the patient, Park Meo and Vito Berger, CMA, today. The intake visit was provided by Samul Dada, scribe, and Vito Berger, CMA.   Allergies: No Known Allergies  Current Medications: No current outpatient medications on file.   No current facility-administered medications for this visit.    No visits with results within 3 Day(s) from this visit.  Latest known visit with results is:  Hospital Outpatient Visit on 02/04/2019  Component Date Value Ref Range Status   Glucose-Capillary 02/04/2019 122* 70 - 99 mg/dL Final    Review of Systems  Constitutional: Negative.  Negative for chills, diaphoresis, fever, malaise/fatigue and weight loss.       Feels "ok".  HENT: Negative.  Negative for congestion, ear pain (resolved), hearing loss, nosebleeds, sinus pain, sore throat and tinnitus.   Eyes: Positive for blurred vision (stable). Negative for double vision and photophobia.  Respiratory: Negative.  Negative for cough, hemoptysis, sputum production, shortness of breath and stridor.   Cardiovascular: Negative.  Negative for chest pain, palpitations and leg swelling.  Gastrointestinal: Negative.  Negative for abdominal pain, blood in stool, constipation, diarrhea, heartburn, melena, nausea and vomiting.  Genitourinary: Negative.  Negative for dysuria, frequency, hematuria and urgency.  Musculoskeletal: Negative.  Negative for back pain, joint pain, myalgias and neck pain.  Skin: Negative.  Negative for itching and rash.  Neurological: Negative.  Negative for dizziness, tingling, sensory change, focal weakness, weakness and headaches.  Endo/Heme/Allergies: Negative.  Does not bruise/bleed easily.  Psychiatric/Behavioral:  Negative for depression and memory loss. The patient is not nervous/anxious and does not have insomnia.   All other systems reviewed and are negative.  Performance status (ECOG): 1   Physical Exam  Constitutional: She is oriented to person, place, and time.  Neurological: She is alert and oriented to person, place, and time.  Psychiatric: She has a normal mood and affect. Her behavior is normal. Judgment and thought content normal.  Nursing note reviewed.   Assessment:  Connie Cameron is a 59 y.o. female with clinical stage [id number] head and neck carcinoma of unknown origin s/p excisional biopsy of the left neck mass on 01/18/2019. 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 She presented with left cervical adenopathy.   Neck CT on 12/21/2018 revealed a 2.4 x 2.1 x 3.4 cm lymph node in the left upper neck. The lymph node showed heterogeneous enhancement and stranding in the surrounding fat. There was a 6 mm left upper posterior lymph node and a 7 mm right level 3 lymph node. Imaging was concerning for malignancy. There were no other enlarged lymph nodes. There was no pharyngeal mass.   PET scan on 02/04/2019 revealed prior left level II cervical node had been excised. There were no findings on PET to suggest a site of primary head/neck cancer. There was  symmetric hypermetabolism along the base of the tongue bilaterally favored to be physiologic. There was no evidence of metastatic disease.   Symptomatically, she feels "ok".  She denies any sore throat or ear pain.  She denies any new adenopathy.  Plan:  1.   Stage IVa head and neck cancer   Patient has a > 1 year history of left neck adenopathy.   Exam reveals no other adenopathy.   Review interval excisional biopsy.   Biopsy confirmed poorly differentiated squamous cell carcinoma of head and neck origin.  Review interval PET scan.   No evidence of primary site or  metastatic disease.  Discuss tumor board recommendations   Discuss follow-up ENT evaluation and additional biopsies and possible bilateral tonsillectomy.   Anticipate concurrent chemotherapy (cisplatin and radiation).   Several questions asked and answered. 2.   Please send patient a copy of PET scan. 3.   Patient has appointment with Dr Kathyrn Sheriff today (surgery planned). 4.   RTC in 2 weeks for MD assessment and finalization of treatment plan.  I discussed the assessment and treatment plan with the patient. The patient was provided an opportunity to ask questions and all were answered. The patient agreed with the plan and demonstrated an understanding of the instructions. The patient was advised to call back or seek an in person evaluation if the symptoms worsen or if the condition fails to improve as anticipated.    I provided 12 minutes (12:13 PM - 12:25 PM) of face-to-face video visit time during this this encounter and > 50% was spent counseling as documented under my assessment and plan. I provided these services from the [facility] office.    Nolon Stalls, MD, PhD  02/07/2018, 12:25 PM   I, Samul Dada, am acting as a scribe for Lequita Asal, MD.  I, [place] Mike Gip, MD, have reviewed the above documentation for accuracy and completeness, and I agree with the above.

## 2019-02-09 ENCOUNTER — Other Ambulatory Visit
Admission: RE | Admit: 2019-02-09 | Discharge: 2019-02-09 | Disposition: A | Discharge status: Home / Self Care | Payer: Medicaid Other | Source: Ambulatory Visit | Attending: Otolaryngology | Admitting: Otolaryngology

## 2019-02-09 DIAGNOSIS — Z01812 Encounter for preprocedural laboratory examination: Secondary | ICD-10-CM | POA: Diagnosis present

## 2019-02-09 DIAGNOSIS — U071 COVID-19: Secondary | ICD-10-CM | POA: Insufficient documentation

## 2019-02-10 LAB — SARS CORONAVIRUS 2 (TAT 6-24 HRS): SARS Coronavirus 2: POSITIVE — AB

## 2019-02-11 ENCOUNTER — Telehealth: Payer: Self-pay | Admitting: Nurse Practitioner

## 2019-02-11 NOTE — Telephone Encounter (Signed)
Called to Discuss with patient about Covid symptoms and the use of bamlanivimab, a monoclonal antibody infusion for those with mild to moderate Covid symptoms and at a high risk of hospitalization.     Pt is qualified for this infusion at the [facility] infusion center due to co-morbid conditions and/or a member of an at-risk group.     Patient Active Problem List   Diagnosis Date Noted  . Squamous cell carcinoma of head and neck ([place]) 02/08/2019  . 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) 02/04/2019  . Lymphadenopathy, cervical 12/28/2018    Patient declines infusion at this time. Patient states she is not having any symptoms. This was a preprocedure test. Symptoms tier reviewed as well as criteria for ending isolation. Preventative practices reviewed. Patient verbalized understanding.    Patient advised to call back if she does develop symptoms and decides that she does want to get infusion. Callback number to the infusion center given. Patient advised to go to Urgent care or ED with severe symptoms.

## 2019-02-18 ENCOUNTER — Other Ambulatory Visit: Admission: RE | Admit: 2019-02-18 | Payer: Self-pay | Source: Ambulatory Visit

## 2019-02-20 NOTE — Progress Notes (Signed)
This encounter was created in error - please disregard.

## 2019-02-22 ENCOUNTER — Encounter: Payer: Self-pay | Admitting: Anesthesiology

## 2019-02-22 ENCOUNTER — Inpatient Hospital Stay: Payer: Self-pay | Admitting: Hematology and Oncology

## 2019-02-22 ENCOUNTER — Other Ambulatory Visit: Payer: Self-pay

## 2019-02-22 ENCOUNTER — Encounter: Payer: Self-pay | Admitting: Otolaryngology

## 2019-02-22 ENCOUNTER — Ambulatory Visit
Admission: RE | Admit: 2019-02-22 | Discharge: 2019-02-22 | Disposition: A | Discharge status: Home / Self Care | Payer: Medicaid Other | Attending: Otolaryngology | Admitting: Otolaryngology

## 2019-02-22 ENCOUNTER — Encounter
Admission: RE | Disposition: A | Discharge status: Home / Self Care | Payer: Self-pay | Source: Home / Self Care | Attending: Otolaryngology

## 2019-02-22 DIAGNOSIS — C770 Secondary and unspecified malignant neoplasm of lymph nodes of head, face and neck: Secondary | ICD-10-CM | POA: Diagnosis not present

## 2019-02-22 DIAGNOSIS — C01 Malignant neoplasm of base of tongue: Secondary | ICD-10-CM | POA: Diagnosis present

## 2019-02-22 DIAGNOSIS — Z6827 Body mass index (BMI) 27.0-27.9, adult: Secondary | ICD-10-CM | POA: Insufficient documentation

## 2019-02-22 DIAGNOSIS — E663 Overweight: Secondary | ICD-10-CM | POA: Insufficient documentation

## 2019-02-22 DIAGNOSIS — Z8616 Personal history of COVID-19: Secondary | ICD-10-CM | POA: Diagnosis not present

## 2019-02-22 HISTORY — PX: LARYNGOSCOPY: SHX5203

## 2019-02-22 SURGERY — LARYNGOSCOPY
Anesthesia: General | Laterality: Bilateral

## 2019-02-22 MED ORDER — PROPOFOL 10 MG/ML IV BOLUS
INTRAVENOUS | Status: DC | PRN
  Administered 2019-02-22: 50 mg via INTRAVENOUS
  Administered 2019-02-22: 130 mg via INTRAVENOUS

## 2019-02-22 MED ORDER — LIDOCAINE HCL (PF) 4 % IJ SOLN
INTRAMUSCULAR | Status: AC
  Filled 2019-02-22: qty 5

## 2019-02-22 MED ORDER — ONDANSETRON HCL 4 MG/2ML IJ SOLN
INTRAMUSCULAR | Status: AC
  Filled 2019-02-22: qty 2

## 2019-02-22 MED ORDER — KETOROLAC TROMETHAMINE 30 MG/ML IJ SOLN
INTRAMUSCULAR | Status: AC
  Filled 2019-02-22: qty 1

## 2019-02-22 MED ORDER — OXYCODONE HCL 5 MG/5ML PO SOLN: 5.0000 mg | Freq: Once | ORAL | Status: AC | PRN

## 2019-02-22 MED ORDER — HYDROCODONE-ACETAMINOPHEN 7.5-325 MG/15ML PO SOLN: 15.0000 mL | Freq: Four times a day (QID) | ORAL | 0 refills | Status: AC | PRN

## 2019-02-22 MED ORDER — MIDAZOLAM HCL 2 MG/2ML IJ SOLN
INTRAMUSCULAR | Status: DC | PRN
  Administered 2019-02-22 (×2): 1 mg via INTRAVENOUS

## 2019-02-22 MED ORDER — DEXAMETHASONE SODIUM PHOSPHATE 10 MG/ML IJ SOLN
INTRAMUSCULAR | Status: DC | PRN
  Administered 2019-02-22: 10 mg via INTRAVENOUS

## 2019-02-22 MED ORDER — FENTANYL CITRATE (PF) 100 MCG/2ML IJ SOLN
25.0000 ug | INTRAMUSCULAR | Status: AC | PRN
  Administered 2019-02-22 (×4): 25 ug via INTRAVENOUS

## 2019-02-22 MED ORDER — SUCCINYLCHOLINE CHLORIDE 20 MG/ML IJ SOLN
INTRAMUSCULAR | Status: AC
  Filled 2019-02-22: qty 1

## 2019-02-22 MED ORDER — SUGAMMADEX SODIUM 200 MG/2ML IV SOLN
INTRAVENOUS | Status: DC | PRN
  Administered 2019-02-22: 150 mg via INTRAVENOUS

## 2019-02-22 MED ORDER — LIDOCAINE HCL (PF) 2 % IJ SOLN
INTRAMUSCULAR | Status: AC
  Filled 2019-02-22: qty 10

## 2019-02-22 MED ORDER — OXYCODONE HCL 5 MG PO TABS: 5.0000 mg | ORAL_TABLET | Freq: Once | ORAL | Status: AC | PRN

## 2019-02-22 MED ORDER — FENTANYL CITRATE (PF) 100 MCG/2ML IJ SOLN
INTRAMUSCULAR | Status: DC | PRN
  Administered 2019-02-22 (×3): 50 ug via INTRAVENOUS

## 2019-02-22 MED ORDER — FENTANYL CITRATE (PF) 100 MCG/2ML IJ SOLN
INTRAMUSCULAR | Status: AC
  Filled 2019-02-22: qty 2

## 2019-02-22 MED ORDER — DEXMEDETOMIDINE HCL 200 MCG/2ML IV SOLN
INTRAVENOUS | Status: DC | PRN
  Administered 2019-02-22: 12 ug via INTRAVENOUS

## 2019-02-22 MED ORDER — DEXAMETHASONE SODIUM PHOSPHATE 10 MG/ML IJ SOLN
INTRAMUSCULAR | Status: AC
  Filled 2019-02-22: qty 1

## 2019-02-22 MED ORDER — ONDANSETRON HCL 4 MG/2ML IJ SOLN
INTRAMUSCULAR | Status: DC | PRN
  Administered 2019-02-22: 4 mg via INTRAVENOUS

## 2019-02-22 MED ORDER — PHENYLEPHRINE HCL 10 % OP SOLN
OPHTHALMIC | Status: DC | PRN
  Administered 2019-02-22: 10 mL via TOPICAL

## 2019-02-22 MED ORDER — FAMOTIDINE 20 MG PO TABS
20.0000 mg | ORAL_TABLET | Freq: Once | ORAL | Status: AC
  Administered 2019-02-22: 20 mg via ORAL

## 2019-02-22 MED ORDER — FENTANYL CITRATE (PF) 100 MCG/2ML IJ SOLN
50.0000 ug | Freq: Once | INTRAMUSCULAR | Status: AC
  Administered 2019-02-22: 50 ug via INTRAVENOUS

## 2019-02-22 MED ORDER — LIDOCAINE HCL (CARDIAC) PF 100 MG/5ML IV SOSY
PREFILLED_SYRINGE | INTRAVENOUS | Status: DC | PRN
  Administered 2019-02-22: 60 mg via INTRAVENOUS

## 2019-02-22 MED ORDER — LACTATED RINGERS IV SOLN: INTRAVENOUS | Status: DC

## 2019-02-22 MED ORDER — KETOROLAC TROMETHAMINE 30 MG/ML IJ SOLN
30.0000 mg | Freq: Once | INTRAMUSCULAR | Status: AC
  Administered 2019-02-22: 30 mg via INTRAVENOUS

## 2019-02-22 MED ORDER — OXYCODONE HCL 5 MG/5ML PO SOLN
ORAL | Status: AC
  Administered 2019-02-22: 5 mg via ORAL
  Filled 2019-02-22: qty 5

## 2019-02-22 MED ORDER — ROCURONIUM BROMIDE 100 MG/10ML IV SOLN
INTRAVENOUS | Status: DC | PRN
  Administered 2019-02-22: 30 mg via INTRAVENOUS

## 2019-02-22 MED ORDER — PROPOFOL 10 MG/ML IV BOLUS
INTRAVENOUS | Status: AC
  Filled 2019-02-22: qty 20

## 2019-02-22 MED ORDER — MIDAZOLAM HCL 2 MG/2ML IJ SOLN
INTRAMUSCULAR | Status: AC
  Filled 2019-02-22: qty 2

## 2019-02-22 MED ORDER — SUCCINYLCHOLINE CHLORIDE 20 MG/ML IJ SOLN
INTRAMUSCULAR | Status: DC | PRN
  Administered 2019-02-22: 100 mg via INTRAVENOUS

## 2019-02-22 MED ORDER — FENTANYL CITRATE (PF) 100 MCG/2ML IJ SOLN
INTRAMUSCULAR | Status: AC
  Administered 2019-02-22: 25 ug via INTRAVENOUS
  Filled 2019-02-22: qty 2

## 2019-02-22 MED ORDER — PHENYLEPHRINE HCL (PRESSORS) 10 MG/ML IV SOLN
INTRAVENOUS | Status: AC
  Filled 2019-02-22: qty 1

## 2019-02-22 MED ORDER — DEXMEDETOMIDINE HCL IN NACL 80 MCG/20ML IV SOLN
INTRAVENOUS | Status: AC
  Filled 2019-02-22: qty 20

## 2019-02-22 MED ORDER — SUGAMMADEX SODIUM 200 MG/2ML IV SOLN
INTRAVENOUS | Status: AC
  Filled 2019-02-22: qty 2

## 2019-02-22 SURGICAL SUPPLY — 21 items
BASIN GRAD PLASTIC 32OZ STRL (MISCELLANEOUS) ×2 IMPLANT
CNTNR SPEC 2.5X3XGRAD LEK (MISCELLANEOUS) ×1
CONT SPEC 4OZ STER OR WHT (MISCELLANEOUS) ×1
CONTAINER SPEC 2.5X3XGRAD LEK (MISCELLANEOUS) ×1 IMPLANT
COVER BACK TABLE REUSABLE LG (DRAPES) ×2 IMPLANT
COVER WAND RF STERILE (DRAPES) ×2 IMPLANT
DRSG TELFA 4X3 1S NADH ST (GAUZE/BANDAGES/DRESSINGS) ×2 IMPLANT
GAUZE SPONGE 4X4 12PLY STRL (GAUZE/BANDAGES/DRESSINGS) ×2 IMPLANT
GLOVE PROTEXIS LATEX SZ 7.5 (GLOVE) ×2 IMPLANT
GOWN STRL REUS W/ TWL LRG LVL3 (GOWN DISPOSABLE) ×1 IMPLANT
GOWN STRL REUS W/TWL LRG LVL3 (GOWN DISPOSABLE) ×1
KIT TURNOVER KIT A (KITS) ×2 IMPLANT
NDL SAFETY ECLIPSE 18X1.5 (NEEDLE) ×1 IMPLANT
NEEDLE FILTER BLUNT 18X 1/2SAF (NEEDLE) ×1
NEEDLE FILTER BLUNT 18X1 1/2 (NEEDLE) ×1 IMPLANT
NEEDLE HYPO 18GX1.5 SHARP (NEEDLE) ×1
PATTIES SURGICAL .5 X.5 (GAUZE/BANDAGES/DRESSINGS) ×2 IMPLANT
PATTIES SURGICAL .5 X3 (DISPOSABLE) IMPLANT
SOL ANTI-FOG 6CC FOG-OUT (MISCELLANEOUS) ×1 IMPLANT
SOL FOG-OUT ANTI-FOG 6CC (MISCELLANEOUS) ×1
TUBING CONNECTING 10 (TUBING) ×2 IMPLANT

## 2019-02-22 NOTE — Anesthesia Procedure Notes (Signed)
Procedure Name: Intubation Date/Time: 02/22/2019 7:38 AM Performed by: Jonna Clark, CRNA Pre-anesthesia Checklist: Patient identified, Patient being monitored, Timeout performed, Emergency Drugs available and Suction available Patient Re-evaluated:Patient Re-evaluated prior to induction Oxygen Delivery Method: Circle system utilized Preoxygenation: Pre-oxygenation with 100% oxygen Induction Type: IV induction Ventilation: Mask ventilation without difficulty Laryngoscope Size: 3 and McGraph Grade View: Grade I Tube type: Oral Tube size: 6.5 mm Number of attempts: 1 Airway Equipment and Method: Stylet Placement Confirmation: ETT inserted through vocal cords under direct vision,  positive ETCO2 and breath sounds checked- equal and bilateral Secured at: 21 cm Tube secured with: Tape Dental Injury: Teeth and Oropharynx as per pre-operative assessment  Difficulty Due To: Difficulty was anticipated and Difficult Airway- due to anterior larynx

## 2019-02-22 NOTE — Discharge Instructions (Addendum)
Laryngoscopy, Care After This sheet gives you information about how to care for yourself after your procedure. Your health care provider may also give you more specific instructions. If you have problems or questions, contact your health care provider. What can I expect after the procedure? After the procedure, it is common to have:  A sore throat.  A hoarse voice.  A temporary change in how your voice sounds. Follow these instructions at home: Medicines  Take over-the-counter and prescription medicines only as told by your health care provider. Driving   Do not drive for 24 hours if you were given a sedative during your procedure. General instructions   Return to your normal activities as told by your health care provider. Ask your health care provider what activities are safe for you. ? If your laryngoscopy was done using medicine to numb only your throat (local anesthetic), you may be able to return to your normal activities right away.  If your health care provider took tissue from your throat for lab testing (biopsy), it is up to you to get your test results. Ask your health care provider, or the department that did the procedure, when your results will be ready.  Do not use any products that contain nicotine or tobacco, such as cigarettes and e-cigarettes. If you need help quitting, ask your health care provider.  Follow instructions from your health care provider about eating or drinking restrictions.  Keep all follow-up visits as told by your health care provider. This is important. Contact a health care provider if:  You have a fever.  You develop a cough.  You develop nausea and vomiting. Get help right away if:  You have severe pain.  You have chest pain.  You have new bleeding during coughing, spitting, or vomiting.  You develop new problems with swallowing.  You have difficulty breathing. Summary  After a laryngoscopy it is common to have a sore throat,  a hoarse voice, or a temporary change in the sound of your voice.  Take over the counter and prescription medicines as told by your health care provider.  Get help right away if you have difficulty breathing after this procedure.  Keep all follow-up visits as told by your health care provider. This is important. This information is not intended to replace advice given to you by your health care provider. Make sure you discuss any questions you have with your health care provider. Document Revised: 05/21/2017 Document Reviewed: 05/21/2017 Elsevier Patient Education  [place]   1) The drugs that you were given will stay in your system until tomorrow so for the next 24 hours you should not:  A) Drive an automobile B) Make any legal decisions C) Drink any alcoholic beverage   2) You may resume regular meals tomorrow.  Today it is better to start with liquids and gradually work up to solid foods.  You may eat anything you prefer, but it is better to start with liquids, then soup and crackers, and gradually work up to solid foods.   3) Please notify your doctor immediately if you have any unusual bleeding, trouble breathing, redness and pain at the surgery site, drainage, fever, or pain not relieved by medication.    4) Additional Instructions:        Please contact your physician with any problems or Same Day Surgery at [phone], Monday through Friday 6 am to 4 pm, or [place] at [facility] number  at [phone].

## 2019-02-22 NOTE — Progress Notes (Unsigned)
[facility]  [address] [place], [place] [address] Phone: [phone]  Fax: [phone]   Telephone Visit:  02/22/2019  Referring physician: No ref. provider found  I connected with [name] on 02/22/19 at 11:47 AM by telephone and verified that I was speaking with the correct person using 2 identifiers.  The patient was at *** home.  I discussed the limitations, risk, security and privacy concerns of performing an evaluation and management service by telephone and the availability of in person appointments.  I also discussed with the patient that there may be a patient responsible charge related to this service.  The patient expressed understanding and agreed to proceed.   Chief Complaint: Connie Cameron is a 59 y.o. 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  HPI: The patient was last seen in the medical oncology clinic on 02/08/2019. At that time, she felt "ok".  She denied any sore throat, ear pain or new adenopathy.  Interval excision biopsy and PET scan were reviewed.  We discussed plans for additional surgery/biopsies and concurrent chemotherapy (ciplatin) and radiation.  She tested positive for COVID on 02/09/2019.  She underwent a microscopic direct laryngoscopy and biopsy by Dr. Margaretha Sheffield on 02/22/2019.  She was noted to have some fullness at the left tongue base.  Pathology from the left tongue base revealed poorly differentiated squamous cell carcinoma.  Malignant cells are positive for p16, p40, and pancytokeratin.   During the interim, ***   Past Medical History:  Diagnosis Date  . Medical history non-contributory     Past Surgical History:  Procedure Laterality Date  . EXCISION MASS NECK Left 01/18/2019   Procedure: EXCISION MASS NECK/NODE;  Surgeon: Margaretha Sheffield, MD;  Location: [facility] ORS;  Service: ENT;  Laterality: Left;  .  TONSILLECTOMY    . TUBAL LIGATION      Family History  Problem Relation Age of Onset  . Breast cancer Sister   . Brain cancer Paternal Grandfather     Social History:  reports that she has never smoked. She has never used smokeless tobacco. She reports previous alcohol use. She reports that she does not use drugs. She use to drink years ago. She denies any exposure to radiation or toxins. 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 She lives in [place]. The patient is {Blank single:19197::"alone","accompanied by"} *** today.  Participants in the patient's visit and their role in the encounter included the patient, ***, and Waymon Budge, RN or Vito Berger, CMA, today.  The intake visit was provided by *** Waymon Budge, RN or Vito Berger, [place].    Allergies: No Known Allergies  Current Medications: Current Outpatient Medications  Medication Sig Dispense Refill  . HYDROcodone-acetaminophen (HYCET) 7.5-325 mg/15 ml solution Take 15 mLs by mouth 4 (four) times daily as needed for up to 7 days for moderate pain. 473 mL 0   No current facility-administered medications for this visit.    Review of Systems  Constitutional: Negative.  Negative for chills, diaphoresis, fever, malaise/fatigue and weight loss.       Feels "ok".  HENT: Negative.  Negative for congestion, ear pain (resolved), hearing loss, nosebleeds, sinus pain, sore throat and tinnitus.   Eyes: Positive for blurred vision (stable). Negative for double vision and photophobia.  Respiratory: Negative.  Negative for cough, hemoptysis, sputum production,  shortness of breath and stridor.   Cardiovascular: Negative.  Negative for chest pain, palpitations and leg swelling.  Gastrointestinal: Negative.  Negative for abdominal pain, blood in stool, constipation, diarrhea, heartburn, melena, nausea and vomiting.  Genitourinary: Negative.  Negative for dysuria, frequency, hematuria and  urgency.  Musculoskeletal: Negative.  Negative for back pain, joint pain, myalgias and neck pain.  Skin: Negative.  Negative for itching and rash.  Neurological: Negative.  Negative for dizziness, tingling, sensory change, focal weakness, weakness and headaches.  Endo/Heme/Allergies: Negative.  Does not bruise/bleed easily.  Psychiatric/Behavioral: Negative for depression and memory loss. The patient is not nervous/anxious and does not have insomnia.   All other systems reviewed and are negative.   Performance status (ECOG): 1 - Symptomatic but completely ambulatory  Vitals There were no vitals taken for this visit.   Physical Exam  Constitutional: She is oriented to person, place, and time.  Neurological: She is alert and oriented to person, place, and time.  Psychiatric: She has a normal mood and affect. Her behavior is normal. Judgment and thought content normal.  Nursing note reviewed.    No visits with results within 3 Day(s) from this visit.  Latest known visit with results is:  Hospital Outpatient Visit on 02/09/2019  Component Date Value Ref Range Status  . SARS Coronavirus 2 02/09/2019 POSITIVE* NEGATIVE Final   Comment: (NOTE) SARS-CoV-2 target nucleic acids are DETECTED. The SARS-CoV-2 RNA is generally detectable in upper and lower respiratory specimens during the acute phase of infection. Positive results are indicative of the presence of SARS-CoV-2 RNA. Clinical correlation with patient history and other diagnostic information is  necessary to determine patient infection status. Positive results do not rule out bacterial infection or co-infection with other viruses.  The expected result is Negative. Fact Sheet for Patients: SugarRoll.be Fact Sheet for Healthcare Providers: https://www.woods-mathews.com/ This test is not yet approved or cleared by the [place] FDA and  has been authorized for detection and/or diagnosis  of SARS-CoV-2 by FDA under an Emergency Use Authorization (EUA). This EUA will remain  in effect (meaning this test can be used) for the duration of the COVID-19 declaration under Section 564(b)(1) of the Act, 21 U.S.C. se                          ction 360bbb-3(b)(1), unless the authorization is terminated or revoked sooner. Performed at [facility], [address], [place], [place] [address]     Assessment:  Connie Cameron is a 59 y.o. female with clinical stage T1N1M0 left tongue base poorly differentiated squamous cell carcinoma s/p biopsy on 02/22/2019.  Pathology revealed poorly differentiated squamous cell carcinoma.  Malignant cells are positive for p16, p40, and pancytokeratin.   She presented with left cervical adenopathy. 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  Node was 4.0 x 2.3 x 1.8 cm.  Neck CT on 12/21/2018 revealed a 2.4 x 2.1 x 3.4 cm lymph node in the left upper neck. The lymph node showed heterogeneous enhancement and stranding in the surrounding fat. There was a 6 mm left upper posterior lymph node and a 7 mm right level 3 lymph node. Imaging was concerning for malignancy. There were no other enlarged lymph nodes. There was no pharyngeal mass.   PET scan on 02/04/2019 revealed prior left level II cervical node had been excised. There were  no findings on PET to suggest a site of primary head/neck cancer. There was symmetric hypermetabolism along the base of the tongue bilaterally favored to be physiologic. There was no evidence of metastatic disease.   Symptomatically, ***  Plan: 1.   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              Exam revealed no other adenopathy.              Excisional lymph node biopsy on 01/18/2019 revealed poorly differentiated squamous cell carcinoma of  head and neck origin.             PET scan on 02/04/2019 revealed no evidence of primary site or metastatic disease.             Left base of tongue biopsy on 02/22/2019 confirmed poorly differentiated squamous cell carcinoma.  Discuss treatment plan:   Cisplatin 100 mg/m2 IV every 3 weeks x 3 weeks with concurrent radiation.   Potential side effects reviewed.  Information provided.   Preauth cisplatin.  Radiation oncology consult.  Schedule baseline audiogram.  Nursing to assess veins.  Consider port-a-cath placement.   2.   Radiation Oncology consult. 3.   Schedule chemotherapy class.  I discussed the assessment and treatment plan with the patient.  The patient was provided an opportunity to ask questions and all were answered.  The patient agreed with the plan and demonstrated an understanding of the instructions.  The patient was advised to call back if the symptoms worsen or if the condition fails to improve as anticipated.  I provided *** minutes (11:47 AM - 11:47 AM) of face-to-face time during this this encounter and > 50% was spent counseling as documented under my assessment and plan.    Lequita Asal, MD, PhD    02/22/2019, 11:47 AM  I, Samul Dada, am acting as a scribe for Lequita Asal, MD.  {Add scribe attestation statement}

## 2019-02-22 NOTE — Op Note (Signed)
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  Surg:  [name]  Anes:  GOT  EBL: 20 mL  Comp: None  Findings: Some fullness left tongue base.  There is very little visible evidence of mucosal abnormality in the area.  No other lesions noted elsewhere in the hypopharynx or larynx.  Procedure: The patient was given general anesthesia by oral endotracheal intubation.  Once patient was asleep a tooth guard was placed in the upper teeth and a Dedo laryngoscope was used for visualizing the hypopharynx and larynx.  The epiglottis was normal-appearing and the vallecula was clear.  The piriform sinuses were completely clear.  The larynx showed normal vocal cords and I could not see any lesions here.  Subglottic space was clear and the posterior arytenoid area space was clear as well.  The tongue base there was minimal lingual tonsils on the right side and the lateral wall appeared normal.  On the left side there was a little more lingual tonsil and slightly firm area near the lateral wall where the tongue base met the lateral wall.  There is slight irritation of the mucosa here but no ulcerations.  Palpation showed some little firmer in this area.  The nasopharynx appeared clear.  The 30 degree scope was used through the laryngoscope for high-power magnification visualizing of these areas.  Large up-biting cup forceps were then used for taking biopsies of the right tongue base area.  Multiple biopsies were taken of the area to make sure we are getting a good sample of tissue.  There is slight ooze from the raw surface and cotton pledgets soaked in phenylephrine were used for vasoconstriction.  This controlled the bleeding.  The patient was awakened taken to the recovery room in satisfactory  condition.  There were no operative complications.  The pathology was sent for permanent sections.  Dispo:   To PACU to be discharged home  Plan: To follow-up in the office to evaluate further.  She will rest at home and push liquids and soft foods.  I written for some Tylenol with codeine liquid that she can use for pain.  [name]  02/22/2019 8:21 AM

## 2019-02-22 NOTE — H&P (Signed)
H&P has been reviewed and patient reevaluated, no changes necessary. To be downloaded later.  

## 2019-02-22 NOTE — Anesthesia Preprocedure Evaluation (Signed)
Anesthesia Evaluation  Patient identified by MRN, date of birth, ID band Patient awake    Reviewed: Allergy & Precautions, H&P , NPO status , Patient's Chart, lab work & pertinent test results  Airway Mallampati: III  TM Distance: >3 FB Neck ROM: full    Dental  (+) Chipped   Pulmonary  Tested Covid positive 13 days ago, denies ever having any Covid-related symptoms          Cardiovascular negative cardio ROS       Neuro/Psych negative neurological ROS  negative psych ROS   GI/Hepatic negative GI ROS, Neg liver ROS,   Endo/Other  negative endocrine ROS  Renal/GU      Musculoskeletal   Abdominal   Peds  Hematology negative hematology ROS (+)   Anesthesia Other Findings Overweight  Past Medical History: No date: Medical history non-contributory  Past Surgical History: 01/18/2019: EXCISION MASS NECK; Left     Comment:  Procedure: EXCISION MASS NECK/NODE;  Surgeon: Margaretha Sheffield, MD;  Location: [facility] ORS;  Service: ENT;                Laterality: Left; No date: TONSILLECTOMY No date: TUBAL LIGATION  BMI    Body Mass Index: 27.34 kg/m      Reproductive/Obstetrics negative OB ROS                             Anesthesia Physical Anesthesia Plan  ASA: II  Anesthesia Plan: General ETT   Post-op Pain Management:    Induction:   PONV Risk Score and Plan: Ondansetron, Dexamethasone, Midazolam and Treatment may vary due to age or medical condition  Airway Management Planned:   Additional Equipment:   Intra-op Plan:   Post-operative Plan:   Informed Consent: I have reviewed the patients History and Physical, chart, labs and discussed the procedure including the risks, benefits and alternatives for the proposed anesthesia with the patient or authorized representative who has indicated his/her understanding and acceptance.     Dental Advisory Given  Plan  Discussed with: Anesthesiologist, CRNA and Surgeon  Anesthesia Plan Comments:         Anesthesia Quick Evaluation

## 2019-02-22 NOTE — Transfer of Care (Signed)
Immediate Anesthesia Transfer of Care Note  Patient: Connie Cameron  Procedure(s) Performed: MICROSCOPIC DIRECT LARYNGOSCOPY AND BIOPSY (Bilateral )  Patient Location: PACU  Anesthesia Type:General  Level of Consciousness: drowsy and patient cooperative  Airway & Oxygen Therapy: Patient Spontanous Breathing and Patient connected to face mask oxygen  Post-op Assessment: Report given to RN and Post -op Vital signs reviewed and stable  Post vital signs: Reviewed and stable  Last Vitals:  Vitals Value Taken Time  BP 147/89 02/22/19 0827  Temp 36.7 C 02/22/19 0827  Pulse 99 02/22/19 0831  Resp 14 02/22/19 0831  SpO2 100 % 02/22/19 0831  Vitals shown include unvalidated device data.  Last Pain:  Vitals:   02/22/19 0827  TempSrc:   PainSc: 0-No pain         Complications: No apparent anesthesia complications

## 2019-02-23 NOTE — Anesthesia Postprocedure Evaluation (Signed)
Anesthesia Post Note  Patient: Connie Cameron  Procedure(s) Performed: MICROSCOPIC DIRECT LARYNGOSCOPY AND BIOPSY (Bilateral )  Patient location during evaluation: PACU Anesthesia Type: General Level of consciousness: awake and alert Pain management: pain level controlled Vital Signs Assessment: post-procedure vital signs reviewed and stable Respiratory status: spontaneous breathing, nonlabored ventilation and respiratory function stable Cardiovascular status: blood pressure returned to baseline and stable Postop Assessment: no apparent nausea or vomiting Anesthetic complications: no     Last Vitals:  Vitals:   02/22/19 0929 02/22/19 0945  BP: 118/65 112/70  Pulse: 77 66  Resp: 16 16  Temp: (!) 35.8 C (!) 36.3 C  SpO2: 95% 95%    Last Pain:  Vitals:   02/22/19 0945  TempSrc: Temporal  PainSc: [place]

## 2019-02-25 LAB — SURGICAL PATHOLOGY

## 2019-02-27 NOTE — Progress Notes (Incomplete)
[facility]  [address], [address] [place], [place] [address] Phone: [phone]  Fax: [phone]   Telephone Visit:  02/27/2019  Referring physician: No ref. provider found  I connected with Connie Cameron on 02/27/19 at 7:48 PM by telephone and verified that I was speaking with the correct person using 2 identifiers.  The patient was at *** home.  I discussed the limitations, risk, security and privacy concerns of performing an evaluation and management service by telephone and the availability of in person appointments.  I also discussed with the patient that there may be a patient responsible charge related to this service.  The patient expressed understanding and agreed to proceed.   Chief Complaint: Connie Cameron is a 59 y.o. 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  HPI: The patient was last seen in the medical oncology clinic on 02/08/2019. At that time, she felt "ok".  She denied any sore throat, ear pain or new adenopathy.  Interval excision biopsy and PET scan were reviewed.  We discussed plans for additional surgery/biopsies and concurrent chemotherapy (ciplatin) and radiation.  She tested positive for COVID 02/09/2019.  She underwent a microscopic direct laryngoscopy and biopsy by Dr. Margaretha Sheffield on 02/22/2019. Pathology showed a diagnostic of malignancy. There was a poorly differentiated squamous cell carcinoma.     During the interim, ***   Past Medical History:  Diagnosis Date  . Medical history non-contributory     Past Surgical History:  Procedure Laterality Date  . EXCISION MASS NECK Left 01/18/2019   Procedure: EXCISION MASS NECK/NODE;  Surgeon: Margaretha Sheffield, MD;  Location: [facility] ORS;  Service: ENT;  Laterality: Left;  . LARYNGOSCOPY Bilateral 02/22/2019   Procedure: MICROSCOPIC DIRECT LARYNGOSCOPY AND BIOPSY;  Surgeon: Margaretha Sheffield,  MD;  Location: [facility] ORS;  Service: ENT;  Laterality: Bilateral;  . TONSILLECTOMY    . TUBAL LIGATION      Family History  Problem Relation Age of Onset  . Breast cancer Sister   . Brain cancer Paternal Grandfather     Social History:  reports that she has never smoked. She has never used smokeless tobacco. She reports previous alcohol use. She reports that she does not use drugs. She use to drink years ago. She denies any exposure to radiation or toxins. 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 She lives in [place]. The patient is {Blank single:19197::"alone","accompanied by"} *** today.  Participants in the patient's visit and their role in the encounter included the patient, ***, and Waymon Budge, RN or Vito Berger, CMA, today.  The intake visit was provided by *** Waymon Budge, RN or Vito Berger, [place].    Allergies: No Known Allergies  Current Medications: Current Outpatient Medications  Medication Sig Dispense Refill  . HYDROcodone-acetaminophen (HYCET) 7.5-325 mg/15 ml solution Take 15 mLs by mouth 4 (four) times daily as needed for up to 7 days for moderate pain. 473 mL 0   No current facility-administered medications for this visit.    Review of Systems  Constitutional: Negative.  Negative for chills, diaphoresis, fever, malaise/fatigue and weight loss.       Feels "ok".  HENT: Negative.  Negative for congestion, ear pain (resolved), hearing loss, nosebleeds, sinus pain, sore throat and tinnitus.   Eyes: Positive for blurred vision (stable). Negative for double vision and photophobia.  Respiratory:  Negative.  Negative for cough, hemoptysis, sputum production, shortness of breath and stridor.   Cardiovascular: Negative.  Negative for chest pain, palpitations and leg swelling.  Gastrointestinal: Negative.  Negative for abdominal pain, blood in stool, constipation, diarrhea, heartburn, melena, nausea and vomiting.    Genitourinary: Negative.  Negative for dysuria, frequency, hematuria and urgency.  Musculoskeletal: Negative.  Negative for back pain, joint pain, myalgias and neck pain.  Skin: Negative.  Negative for itching and rash.  Neurological: Negative.  Negative for dizziness, tingling, sensory change, focal weakness, weakness and headaches.  Endo/Heme/Allergies: Negative.  Does not bruise/bleed easily.  Psychiatric/Behavioral: Negative for depression and memory loss. The patient is not nervous/anxious and does not have insomnia.   All other systems reviewed and are negative.   Performance status (ECOG): 1   Physical Exam  Constitutional: She is oriented to person, place, and time.  Neurological: She is alert and oriented to person, place, and time.  Psychiatric: She has a normal mood and affect. Her behavior is normal. Judgment and thought content normal.  Nursing note reviewed.    No visits with results within 3 Day(s) from this visit.  Latest known visit with results is:  Admission on 02/22/2019, Discharged on 02/22/2019  Component Date Value Ref Range Status  . SURGICAL PATHOLOGY 02/22/2019    Final-Edited                   Value:SURGICAL PATHOLOGY CASE: [id number] PATIENT: Connie Cameron Surgical Pathology Report     Specimen Submitted: A. Tongue, left base  Clinical History: Malignant neoplasm base of tongue      DIAGNOSIS: A. TONGUE, LEFT BASE; BIOPSY: - DIAGNOSTIC OF MALIGNANCY. - POORLY DIFFERENTIATED SQUAMOUS CELL CARCINOMA; SEE COMMENT.  Comment: The morphologic features are identical those seen in the prior excisional biopsy of left neck mass [id number]).  Immunohistochemical stains were performed on block A1.  The malignant cells are positive for p16, p40, and pancytokeratin.  IHC slides were prepared by [facility], [place], [place]. All controls stained appropriately.  This test was developed and its performance  characteristics determined by [facility]. It has not been cleared or approved by the [place] Food and Drug Administration. The FDA does not require this test to go through premarket FDA review. This test is used for clinical purposes                         . It should not be regarded as investigational or for research. This laboratory is certified under the Clinical Laboratory Improvement Amendments (CLIA) as qualified to perform high complexity clinical laboratory testing.   GROSS DESCRIPTION: A. Labeled: Left base of the tongue Received: In formalin Tissue fragment(s): Multiple Size: Aggregate, 1.5 x 1.3 x 0.2 cm Description: Unoriented pink rubbery soft tissue fragments Entirely submitted in 1 cassette.    Final Diagnosis performed by Betsy Pries, MD.   Electronically signed 02/25/2019 4:33:12PM The electronic signature indicates that the named Attending Pathologist has evaluated the specimen Technical component performed at [facility], [address], [place], [place] [address] Lab: [phone] Dir: Rush Farmer, MD, MMM  Professional component performed at [facility], [facility], [place], [place],  [address] Lab: [phone] Dir: Dellia Nims. Rubinas, MD     Assessment:  Connie Cameron is a 59 y.o. female with clinical stage [id number] head and neck carcinoma of unknown origin s/p excisional biopsy of the left neck mass on 01/18/2019. Pathology revealed  metastatic p16 positive poorly differentiated squamous cell carcinoma, likely of ENT origin. She presented with left cervical adenopathy.   Neck CT on 12/21/2018 revealed a 2.4 x 2.1 x 3.4 cm lymph node in the left upper neck. The lymph node showed heterogeneous enhancement and stranding in the surrounding fat. There was a 6 mm left upper posterior lymph node and a 7 mm right level 3 lymph node. Imaging was concerning for malignancy. There were no other enlarged lymph nodes. There was no pharyngeal mass.    PET scan on 02/04/2019 revealed prior left level II cervical node had been excised. There were no findings on PET to suggest a site of primary head/neck cancer. There was symmetric hypermetabolism along the base of the tongue bilaterally favored to be physiologic. There was no evidence of metastatic disease.   Symptomatically, ***  Plan: 1.   Stage IVa head and neck cancer              Patient has a > 1 year history of left neck adenopathy.              Exam reveals no other adenopathy.              Review interval excisional biopsy.                         Biopsy confirmed poorly differentiated squamous cell carcinoma of head and neck origin.             Review interval PET scan.                         No evidence of primary site or metastatic disease.             Discuss tumor board recommendations                         Discuss follow-up ENT evaluation and additional biopsies and possible bilateral tonsillectomy.                         Anticipate concurrent chemotherapy (cisplatin and radiation).                         Several questions asked and answered. 2.   Please send patient a copy of PET scan. 3.   Patient has appointment with Dr Kathyrn Sheriff today (surgery planned). 4.   RTC in 2 weeks for MD assessment and finalization of treatment plan.  I discussed the assessment and treatment plan with the patient.  The patient was provided an opportunity to ask questions and all were answered.  The patient agreed with the plan and demonstrated an understanding of the instructions.  The patient was advised to call back if the symptoms worsen or if the condition fails to improve as anticipated.  I provided *** minutes (7:48 PM - 7:48 PM) of face-to-face time during this this encounter and > 50% was spent counseling as documented under my assessment and plan.    Lequita Asal, MD, PhD    02/27/2019, 7:48 PM  I, Selena Batten, am acting as a scribe for Lequita Asal, MD.  {Add  scribe attestation statement}

## 2019-02-28 ENCOUNTER — Other Ambulatory Visit: Payer: Self-pay | Admitting: Hematology and Oncology

## 2019-02-28 DIAGNOSIS — C01 Malignant neoplasm of base of tongue: Secondary | ICD-10-CM

## 2019-02-28 DIAGNOSIS — Z7189 Other specified counseling: Secondary | ICD-10-CM | POA: Insufficient documentation

## 2019-02-28 NOTE — Progress Notes (Signed)
START ON PATHWAY REGIMEN - Head and Neck     A cycle is every 21 days:     Cisplatin   **Always confirm dose/schedule in your pharmacy ordering system**  Patient Characteristics: Oropharynx, HPV Positive, Clinically Staged, T0-4, cN1-3 or T3-4, cN0 Disease Classification: Oropharynx HPV Status: Positive (+) AJCC N Category: cN1 AJCC 8 Stage Grouping: I Current Disease Status: No Distant Metastases and No Recurrent Disease AJCC T Category: T1 AJCC M Category: M0 Intent of Therapy: Curative Intent, Discussed with Patient

## 2019-03-01 ENCOUNTER — Inpatient Hospital Stay: Payer: Self-pay | Admitting: Hematology and Oncology

## 2019-03-01 DIAGNOSIS — C01 Malignant neoplasm of base of tongue: Secondary | ICD-10-CM

## 2019-03-01 DIAGNOSIS — Z7189 Other specified counseling: Secondary | ICD-10-CM

## 2019-03-08 NOTE — Progress Notes (Signed)
Confirmed DOB and Name. Denies any concerns.

## 2019-03-09 ENCOUNTER — Inpatient Hospital Stay: Payer: Medicaid Other | Attending: Hematology and Oncology | Admitting: Hematology and Oncology

## 2019-03-09 ENCOUNTER — Encounter: Payer: Self-pay | Admitting: Hematology and Oncology

## 2019-03-09 ENCOUNTER — Telehealth: Payer: Self-pay | Admitting: Hematology and Oncology

## 2019-03-09 DIAGNOSIS — Z79899 Other long term (current) drug therapy: Secondary | ICD-10-CM | POA: Insufficient documentation

## 2019-03-09 DIAGNOSIS — G4700 Insomnia, unspecified: Secondary | ICD-10-CM | POA: Insufficient documentation

## 2019-03-09 DIAGNOSIS — Z808 Family history of malignant neoplasm of other organs or systems: Secondary | ICD-10-CM | POA: Insufficient documentation

## 2019-03-09 DIAGNOSIS — Z7189 Other specified counseling: Secondary | ICD-10-CM

## 2019-03-09 DIAGNOSIS — C01 Malignant neoplasm of base of tongue: Secondary | ICD-10-CM | POA: Insufficient documentation

## 2019-03-09 DIAGNOSIS — Z5111 Encounter for antineoplastic chemotherapy: Secondary | ICD-10-CM | POA: Insufficient documentation

## 2019-03-09 DIAGNOSIS — Z803 Family history of malignant neoplasm of breast: Secondary | ICD-10-CM | POA: Insufficient documentation

## 2019-03-09 DIAGNOSIS — F419 Anxiety disorder, unspecified: Secondary | ICD-10-CM | POA: Insufficient documentation

## 2019-03-09 NOTE — Progress Notes (Signed)
[facility]  [address], [address] [place], [place] [address] Phone: [phone]  Fax: [phone]   Telephone Visit:  03/09/2019  Referring physician: No ref. provider found  I connected with Connie Cameron on 03/09/19 at 9:27 AM by telephone and verified that I was speaking with the correct person using 2 identifiers.  The patient was at home.  I discussed the limitations, risk, security and privacy concerns of performing an evaluation and management service by telephone and the availability of in person appointments.  I also discussed with the patient that there may be a patient responsible charge related to this service.  The patient expressed understanding and agreed to proceed.   Chief Complaint: Connie Cameron is a 59 y.o. 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  HPI: The patient was last seen in the medical oncology clinic on 02/08/2019. At that time, she felt "ok".  She denied any sore throat, ear pain or new adenopathy.  Interval excision biopsy and PET scan were reviewed.  We discussed plans for additional surgery/biopsies and concurrent chemotherapy (ciplatin) and radiation.  She tested positive for COVID on 02/09/2019. She had no symptoms.   She underwent a microscopic direct laryngoscopy and biopsy by Dr. Margaretha Sheffield on 02/22/2019.  She was noted to have some fullness at the left tongue base.  Pathology from the left tongue base revealed poorly differentiated squamous cell carcinoma.  Malignant cells were positive for p16, p40, and pancytokeratin.   During the interim, she has felt "good".  She has healed from her biopsy. She denies any weight loss or swallowing issues. She denies any cough or shortness of breath.   We discussed chemotherapy and radiation.   Potential side effects were reviewed.  We discussed a baseline audiogram.  We discussed  attending chemotherapy class.  We discussed IV access and possible port-a-cath if IV access appears difficult.  We discussed referral to radiation oncology.  Patient understood and was in agreedment.   Past Medical History:  Diagnosis Date  . Medical history non-contributory     Past Surgical History:  Procedure Laterality Date  . EXCISION MASS NECK Left 01/18/2019   Procedure: EXCISION MASS NECK/NODE;  Surgeon: Margaretha Sheffield, MD;  Location: [facility] ORS;  Service: ENT;  Laterality: Left;  . LARYNGOSCOPY Bilateral 02/22/2019   Procedure: MICROSCOPIC DIRECT LARYNGOSCOPY AND BIOPSY;  Surgeon: Margaretha Sheffield, MD;  Location: [facility] ORS;  Service: ENT;  Laterality: Bilateral;  . TONSILLECTOMY    . TUBAL LIGATION      Family History  Problem Relation Age of Onset  . Breast cancer Sister   . Brain cancer Paternal Grandfather     Social History:  reports that she has never smoked. She has never used smokeless tobacco. She reports previous alcohol use. She reports that she does not use drugs. She use to drink years ago. She denies any exposure to radiation or toxins. 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 She lives in [place]. The patient is alone today.  Participants in the patient's visit and their role in the encounter included the patient and Waymon Budge, RN, today.  The intake visit was provided by Waymon Budge, RN.   Allergies: No Known Allergies  Current Medications: No current outpatient medications on file.   No current facility-administered medications for this visit.    Review of  Systems  Constitutional: Negative.  Negative for chills, diaphoresis, fever, malaise/fatigue and weight loss.       Feels "good".  HENT: Negative.  Negative for congestion, ear pain, hearing loss, nosebleeds, sinus pain, sore throat and tinnitus.   Eyes: Negative.  Negative for blurred vision (stable), double vision and photophobia.  Respiratory: Negative.   Negative for cough, hemoptysis, sputum production, shortness of breath and stridor.   Cardiovascular: Negative.  Negative for chest pain, palpitations and leg swelling.  Gastrointestinal: Negative.  Negative for abdominal pain, blood in stool, constipation, diarrhea, heartburn, melena, nausea and vomiting.  Genitourinary: Negative.  Negative for dysuria, frequency, hematuria and urgency.  Musculoskeletal: Negative.  Negative for back pain, joint pain, myalgias and neck pain.  Skin: Negative.  Negative for itching and rash.  Neurological: Negative.  Negative for dizziness, tingling, sensory change, speech change, focal weakness, weakness and headaches.  Endo/Heme/Allergies: Negative.  Does not bruise/bleed easily.  Psychiatric/Behavioral: Negative.  Negative for depression and memory loss. The patient is not nervous/anxious and does not have insomnia.   All other systems reviewed and are negative.  Performance status (ECOG): 0  Physical Exam  Constitutional: She is oriented to person, place, and time.  Neurological: She is alert and oriented to person, place, and time.  Psychiatric: She has a normal mood and affect. Her behavior is normal. Judgment and thought content normal.  Nursing note reviewed.   No visits with results within 3 Day(s) from this visit.  Latest known visit with results is:  Admission on 02/22/2019, Discharged on 02/22/2019  Component Date Value Ref Range Status  . SURGICAL PATHOLOGY 02/22/2019    Final-Edited                   Value:SURGICAL PATHOLOGY CASE: [id number] PATIENT: Connie Cameron Surgical Pathology Report  Specimen Submitted: A. Tongue, left base  Clinical History: Malignant neoplasm base of tongue  DIAGNOSIS: A. TONGUE, LEFT BASE; BIOPSY: - DIAGNOSTIC OF MALIGNANCY. - POORLY DIFFERENTIATED SQUAMOUS CELL CARCINOMA; SEE COMMENT.  Comment: The morphologic features are identical those seen in the prior excisional biopsy of left neck mass  [id number]).  Immunohistochemical stains were performed on block A1.  The malignant cells are positive for p16, p40, and pancytokeratin.  IHC slides were prepared by [facility], [place], [place]. All controls stained appropriately.  This test was developed and its performance characteristics determined by [facility]. It has not been cleared or approved by the [place] Food and Drug Administration. The FDA does not require this test to go through premarket FDA review. This test is used for clinical purposes                         . It should not be regarded as investigational or for research. This laboratory is certified under the Clinical Laboratory Improvement Amendments (CLIA) as qualified to perform high complexity clinical laboratory testing.  GROSS DESCRIPTION: A. Labeled: Left base of the tongue Received: In formalin Tissue fragment(s): Multiple Size: Aggregate, 1.5 x 1.3 x 0.2 cm Description: Unoriented pink rubbery soft tissue fragments Entirely submitted in 1 cassette.  Final Diagnosis performed by Betsy Pries, MD.   Electronically signed 02/25/2019 4:33:12PM The electronic signature indicates that the named Attending Pathologist has evaluated the specimen Technical component performed at [facility], [address] [place], [place] [address] Lab: [phone] Dir: Rush Farmer, MD, MMM  Professional component performed at [facility], [facility], [place],  [place] [address] Lab: [phone] Dir: Dellia Nims. Rubinas, MD    Assessment:  Connie Cameron is a 59 y.o. female with clinical stage T1N1M0 left base of tongue poorly differentiated squamous cell carcinoma s/p biopsy on 02/22/2019.  Pathology revealed poorly differentiated squamous cell carcinoma.  Malignant cells are positive for p16, p40, and pancytokeratin.   She presented with left cervical adenopathy. 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  Node was 4.0 x 2.3 x 1.8 cm.  Neck CT on 12/21/2018 revealed a 2.4 x 2.1 x 3.4 cm lymph node in the left upper neck. The lymph node showed heterogeneous enhancement and stranding in the surrounding fat. There was a 6 mm left upper posterior lymph node and a 7 mm right level 3 lymph node. Imaging was concerning for malignancy. There were no other enlarged lymph nodes. There was no pharyngeal mass.   PET scan on 02/04/2019 revealed prior left level II cervical node had been excised. There were no findings on PET to suggest a site of primary head/neck cancer. There was symmetric hypermetabolism along the base of the tongue bilaterally favored to be physiologic. There was no evidence of metastatic disease.   Symptomatically, she feels "good".  She denies any symptoms s/p biopsy.  Plan: 1.   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              Exam revealed no other adenopathy.              Excisional lymph node biopsy on 01/18/2019 revealed poorly differentiated squamous cell carcinoma of head and neck origin.             PET scan on 02/04/2019 revealed no evidence of primary site or metastatic disease.             Left base of tongue biopsy on 02/22/2019 confirmed poorly differentiated squamous cell carcinoma.  Discuss treatment plan:   Cisplatin 100 mg/m2 IV every 3 weeks x 3 weeks with concurrent radiation.   Potential side effects reviewed.  Information provided.   Preauth cisplatin.  Radiation oncology consult.  Schedule baseline audiogram.  Nursing to assess veins.  Consider port-a-cath placement. 2.   Radiation Oncology consult. 3.   Schedule chemotherapy class. 4.   RTC for MD assessment, labs (CBC with diff, CMP, Mg), and cycle #1 cisplatin and concurrent radiation.  I discussed the assessment and treatment plan with  the patient.  The patient was provided an opportunity to ask questions and all were answered.  The patient agreed with the plan and demonstrated an understanding of the instructions.  The patient was advised to call back if the symptoms worsen or if the condition fails to improve as anticipated.  I provided 12 minutes (9:27 AM - 9:39 AM) of non face-to-face time during this this encounter and > 50% was spent counseling as documented under my assessment and plan.    Lequita Asal, MD, PhD    03/09/2019, 9:27 AM  I, Selena Batten, am acting as a scribe for Lequita Asal, MD.  I, [place] Mike Gip, MD, have reviewed the above documentation for accuracy and completeness, and I agree with the above.

## 2019-03-09 NOTE — Patient Instructions (Signed)

## 2019-03-09 NOTE — Telephone Encounter (Signed)
I called Connie Cameron to give her appt w/ Dr Kathyrn Sheriff for audiogram. Her appt is 2/4 @ 11:30 am. patient was in agreement. Also scheduled patient for Chemo class 2/9 @9  am and consult with Dr Baruch Gouty same day 2/9 @ 130 pm. Patient understood and is in agreement.

## 2019-03-11 ENCOUNTER — Encounter: Payer: Self-pay | Admitting: Otolaryngology

## 2019-03-11 ENCOUNTER — Telehealth: Payer: Self-pay | Admitting: Otolaryngology

## 2019-03-11 NOTE — Telephone Encounter (Signed)
Error

## 2019-03-15 ENCOUNTER — Other Ambulatory Visit: Payer: Self-pay

## 2019-03-15 ENCOUNTER — Inpatient Hospital Stay: Payer: Medicaid Other

## 2019-03-15 NOTE — Patient Instructions (Signed)

## 2019-03-16 ENCOUNTER — Ambulatory Visit
Admission: RE | Admit: 2019-03-16 | Discharge: 2019-03-16 | Disposition: A | Discharge status: Home / Self Care | Payer: Medicaid Other | Source: Ambulatory Visit | Attending: Radiation Oncology | Admitting: Radiation Oncology

## 2019-03-16 ENCOUNTER — Encounter: Payer: Self-pay | Admitting: Radiation Oncology

## 2019-03-16 ENCOUNTER — Inpatient Hospital Stay (HOSPITAL_BASED_OUTPATIENT_CLINIC_OR_DEPARTMENT_OTHER): Payer: Medicaid Other | Admitting: Nurse Practitioner

## 2019-03-16 ENCOUNTER — Inpatient Hospital Stay: Payer: Medicaid Other

## 2019-03-16 ENCOUNTER — Other Ambulatory Visit: Payer: Self-pay

## 2019-03-16 DIAGNOSIS — Z5111 Encounter for antineoplastic chemotherapy: Secondary | ICD-10-CM | POA: Diagnosis not present

## 2019-03-16 DIAGNOSIS — Z808 Family history of malignant neoplasm of other organs or systems: Secondary | ICD-10-CM | POA: Diagnosis not present

## 2019-03-16 DIAGNOSIS — G4700 Insomnia, unspecified: Secondary | ICD-10-CM | POA: Diagnosis not present

## 2019-03-16 DIAGNOSIS — C01 Malignant neoplasm of base of tongue: Secondary | ICD-10-CM | POA: Diagnosis not present

## 2019-03-16 DIAGNOSIS — F419 Anxiety disorder, unspecified: Secondary | ICD-10-CM | POA: Diagnosis not present

## 2019-03-16 DIAGNOSIS — Z803 Family history of malignant neoplasm of breast: Secondary | ICD-10-CM | POA: Diagnosis not present

## 2019-03-16 DIAGNOSIS — Z79899 Other long term (current) drug therapy: Secondary | ICD-10-CM | POA: Diagnosis not present

## 2019-03-16 NOTE — Progress Notes (Signed)
[facility]  [phone] [phone]  Patient Care Team: Patient, No Pcp Per as PCP - General (General Practice) Margaretha Sheffield, MD (Otolaryngology)   Name of the patient: Connie Cameron  [id number]  [birth year]   Date of visit: 03/16/19  Diagnosis-carcinoma of the base of tongue  Chief complaint/Reason for visit- Initial Meeting for [facility], preparing for starting chemotherapy  Heme/Onc history:  Oncology History  Carcinoma of base of tongue ([place])  02/04/2019 Initial Diagnosis   Carcinoma of base of tongue ([place])   03/08/2019 -  Chemotherapy   The patient had palonosetron (ALOXI) injection 0.25 mg, 0.25 mg, Intravenous,  Once, 0 of 3 cycles CISplatin (PLATINOL) 176 mg in sodium chloride 0.9 % 500 mL chemo infusion, 100 mg/m2 = 176 mg, Intravenous,  Once, 0 of 3 cycles fosaprepitant (EMEND) 150 mg in sodium chloride 0.9 % 145 mL IVPB, 150 mg, Intravenous,  Once, 0 of 3 cycles  for chemotherapy treatment.      Interval history-  Connie Cameron, 59 year old female, who presents to chemo care clinic today for initial meeting in preparation for starting chemotherapy. I introduced the chemo care clinic and we discussed that the role of the clinic is to assist those who are at an increased risk of emergency room visits and/or complications during the course of chemotherapy treatment. We discussed that the increased risk takes into account factors such as age, performance status, and co-morbidities. We also discussed that for some, this might include barriers to care such as not having a primary care provider, lack of insurance/transportation, or not being able to afford medications. We discussed that the goal of the program is to help prevent unplanned ER visits and help reduce complications during chemotherapy. We do this by discussing specific risk factors to each individual and identifying ways that we can help  improve these risk factors and reduce barriers to care.  We discussed that she was identified as high risk of ER hospitalization based on prior history of hospitalization and ER utilization, history of CVD, and not identifying as having a primary care doctor.  ECOG FS:1 - Symptomatic but completely ambulatory  Review of systems- Review of Systems  Constitutional: Negative for chills, fever, malaise/fatigue and weight loss.  HENT: Negative for hearing loss, nosebleeds, sore throat and tinnitus.   Eyes: Negative for blurred vision and double vision.  Respiratory: Negative for cough, hemoptysis, shortness of breath and wheezing.   Cardiovascular: Negative for chest pain, palpitations and leg swelling.  Gastrointestinal: Negative for abdominal pain, blood in stool, constipation, diarrhea, melena, nausea and vomiting.  Genitourinary: Negative for dysuria and urgency.  Musculoskeletal: Negative for back pain, falls, joint pain and myalgias.  Skin: Negative for itching and rash.  Neurological: Negative for dizziness, tingling, sensory change, loss of consciousness, weakness and headaches.  Endo/Heme/Allergies: Negative for environmental allergies. Does not bruise/bleed easily.  Psychiatric/Behavioral: Negative for depression. The patient is nervous/anxious and has insomnia.      Current treatment-plan for concurrent chemotherapy/cisplatin palliative care and radiation  No Known Allergies  Past Medical History:  Diagnosis Date  . Medical history non-contributory     Past Surgical History:  Procedure Laterality Date  . EXCISION MASS NECK Left 01/18/2019   Procedure: EXCISION MASS NECK/NODE;  Surgeon: Margaretha Sheffield, MD;  Location: [facility] ORS;  Service: ENT;  Laterality: Left;  . LARYNGOSCOPY Bilateral 02/22/2019   Procedure: MICROSCOPIC DIRECT LARYNGOSCOPY AND BIOPSY;  Surgeon: Margaretha Sheffield,  MD;  Location: [facility] ORS;  Service: ENT;  Laterality: Bilateral;  . TONSILLECTOMY    . TUBAL LIGATION       Social History   Socioeconomic History  . Marital status: Single    Spouse name: Not on file  . Number of children: Not on file  . Years of education: Not on file  . Highest education level: Not on file  Occupational History  . Not on file  Tobacco Use  . Smoking status: Never Smoker  . Smokeless tobacco: Never Used  Substance and Sexual Activity  . Alcohol use: Not Currently  . Drug use: No  . Sexual activity: Not on file  Other Topics Concern  . Not on file  Social History Narrative  . Not on file   Social Determinants of Health   Financial Resource Strain:   . Difficulty of Paying Living Expenses: Not on file  Food Insecurity:   . Worried About Charity fundraiser in the Last Year: Not on file  . Ran Out of Food in the Last Year: Not on file  Transportation Needs:   . Lack of Transportation (Medical): Not on file  . Lack of Transportation (Non-Medical): Not on file  Physical Activity:   . Days of Exercise per Week: Not on file  . Minutes of Exercise per Session: Not on file  Stress:   . Feeling of Stress : Not on file  Social Connections:   . Frequency of Communication with Friends and Family: Not on file  . Frequency of Social Gatherings with Friends and Family: Not on file  . Attends Religious Services: Not on file  . Active Member of Clubs or Organizations: Not on file  . Attends Archivist Meetings: Not on file  . Marital Status: Not on file  Intimate Partner Violence:   . Fear of Current or Ex-Partner: Not on file  . Emotionally Abused: Not on file  . Physically Abused: Not on file  . Sexually Abused: Not on file    Family History  Problem Relation Age of Onset  . Breast cancer Sister   . Brain cancer Paternal Grandfather     No current outpatient medications on file.  Physical exam: There were no vitals filed for this visit. Physical Exam Constitutional:      General: She is not in acute distress.    Comments: Seen in radiation  exam room. Unaccompanied. Wearing mask.   Eyes:     Conjunctiva/sclera: Conjunctivae normal.  Pulmonary:     Effort: No respiratory distress.  Skin:    General: Skin is dry.  Neurological:     Mental Status: She is alert and oriented to person, place, and time.  Psychiatric:        Mood and Affect: Mood is anxious.        Behavior: Behavior normal.      CMP Latest Ref Rng & Units 12/21/2018  Glucose 70 - 99 mg/dL 180(H)  BUN 6 - 20 mg/dL 12  Creatinine 0.44 - 1.00 mg/dL 0.79  Sodium 135 - 145 mmol/L 141  Potassium 3.5 - 5.1 mmol/L 3.7  Chloride 98 - 111 mmol/L 105  CO2 22 - 32 mmol/L 25  Calcium 8.9 - 10.3 mg/dL 9.6  Total Protein 6.5 - 8.1 g/dL 8.1  Total Bilirubin 0.3 - 1.2 mg/dL 0.7  Alkaline Phos 38 - 126 U/L 97  AST 15 - 41 U/L 20  ALT 0 - 44 U/L 15   CBC Latest  Ref Rng & Units 12/21/2018  WBC 4.0 - 10.5 K/uL 12.3(H)  Hemoglobin 12.0 - 15.0 g/dL 13.8  Hematocrit 36.0 - 46.0 % 41.7  Platelets 150 - 400 K/uL 282    No images are attached to the encounter.  No results found.   Assessment and plan- Patient is a 59 y.o. female who presents to [facility] for initial meeting in preparation for starting chemotherapy for the treatment of cancer of tongue base.   1. Stage I squamous cell carcinoma of the left tongue base-currently awaiting concurrent cisplatin chemotherapy and radiation.  Plan is to give her 3 doses of cisplatin 100 mg/m every 3 weeks.  We again reviewed risks versus benefits of chemotherapy including but not limited to nausea, vomiting, low blood counts, risks of infections and hospitalization.  Advised of risk of peripheral neuropathy and kidney injuries with cisplatin.  Treatment given with curative intent.  She is followed by Dr. Janese Banks and Dr. Mike Gip and Dr. Baruch Gouty with radiation oncology.  2. Chemo Care Clinic/High Risk for ER/Hospitalization during chemotherapy- We discussed the role of the chemo care clinic and identified patient specific  risk factors. I discussed that patient was identified as high risk primarily based on: Previous hospitalizations, prior ER utilization, history of CVD, no primary care provider.  Will provide patient with information to self refer for primary care provider today.   3. Social Determinants of Health- we discussed that social determinants of health may have significant impacts on health and outcomes for cancer patients.  Today we discussed specific social determinants of performance status, alcohol use, depression, financial needs, food insecurity, housing, interpersonal violence, social connections, stress, tobacco use, and transportation.  After lengthy discussion she denies specific areas of need.  We discussed services available through the cancer center in detail.  4.  Poor sleep-patient reports poor sleep secondary to anxiety associated with her recent cancer diagnosis.  Encouraged her to discuss with Dr. Mike Gip who may consider starting medication.  She can also consider discussing with our palliative care team.  5. Palliative Care- based on stage of cancer and/or identified needs today, I will refer patient to palliative care for goals of care and advanced care planning and ongoing symptom management  Patient also reports concerns with getting her chemotherapy in medicine but having to come to [facility] for radiation treatments.  Will discuss with her primary medical oncology team to see if accommodations can be made.   We also discussed the role of the Symptom Management Clinic at [facility] for acute issues and methods of contacting clinic/provider. She denies needing other specific assistance at this time.  Visit Diagnosis 1. Carcinoma of base of tongue ([place])    Patient expressed understanding and was in agreement with this plan. She also understands that She can call clinic at any time with any questions, concerns, or complaints.   A total of (25) minutes of face-to-face time was spent with  this patient with greater than 50% of that time in counseling and care-coordination.  Beckey Rutter, DNP, AGNP-C [facility]

## 2019-03-16 NOTE — Consult Note (Signed)
NEW PATIENT EVALUATION  Name: Connie Cameron  MRN: [id number]  Date:   03/16/2019     DOB: 05/08/1960   This 59 y.o. 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  REFERRING PHYSICIAN: Lequita Asal, MD  CHIEF COMPLAINT:  Chief Complaint  Patient presents with  . Cancer    Initial consultation    DIAGNOSIS: The encounter diagnosis was Carcinoma of base of tongue ([place]).   PREVIOUS INVESTIGATIONS:  PET/CT and CT scans reviewed Clinical notes reviewed Pathology report reviewed  HPI: Patient is a 59 year old female who is noticed a large mass in her left neck for over the past year.  She was seen by ENT and initial CT scan showed enlarged lymph node in the left upper neck measuring 2.4 x 3.4 cm concerning for malignancy.  Patient had excisional biopsy positive for poorly differentiated squamous cell carcinoma p16 p40 positive.  PET CT was performed showing prior left cervical to the node being excised there was symmetric hypermetabolic activity along the base of tongue bilaterally favored to be physiologic.  Patient underwent biopsy of left tongue base which again was positive for poorly differentiated squamous cell carcinoma compatible with a primary lesion.  Patient is been seen by medical oncology and plan is for concurrent chemoradiation.  She is seen today for radiation oncology opinion she is doing well she specifically denies dysphagia head and neck pain.  She is well-healed from her excisional biopsy of the left cervical node.  PLANNED TREATMENT REGIMEN: Concurrent chemoradiation radiation will be IMRT treatment planning and delivery  PAST MEDICAL HISTORY:  has a past medical history of Medical history non-contributory.    PAST SURGICAL HISTORY:  Past Surgical History:  Procedure Laterality Date  . EXCISION MASS NECK Left 01/18/2019   Procedure: EXCISION MASS NECK/NODE;   Surgeon: Margaretha Sheffield, MD;  Location: [facility] ORS;  Service: ENT;  Laterality: Left;  . LARYNGOSCOPY Bilateral 02/22/2019   Procedure: MICROSCOPIC DIRECT LARYNGOSCOPY AND BIOPSY;  Surgeon: Margaretha Sheffield, MD;  Location: [facility] ORS;  Service: ENT;  Laterality: Bilateral;  . TONSILLECTOMY    . TUBAL LIGATION      FAMILY HISTORY: family history includes Brain cancer in her paternal grandfather; Breast cancer in her sister.  SOCIAL HISTORY:  reports that she has never smoked. She has never used smokeless tobacco. She reports previous alcohol use. She reports that she does not use drugs.  ALLERGIES: Patient has no known allergies.  MEDICATIONS:  No current outpatient medications on file.   No current facility-administered medications for this encounter.    ECOG PERFORMANCE STATUS:  0 - Asymptomatic  REVIEW OF SYSTEMS: Patient denies any weight loss, fatigue, weakness, fever, chills or night sweats. Patient denies any loss of vision, blurred vision. Patient denies any ringing  of the ears or hearing loss. No irregular heartbeat. Patient denies heart murmur or history of fainting. Patient denies any chest pain or pain radiating to her upper extremities. Patient denies any shortness of breath, difficulty breathing at night, cough or hemoptysis. Patient denies any swelling in the lower legs. Patient denies any nausea vomiting, vomiting of blood, or coffee ground material in the vomitus. Patient denies any stomach pain. Patient states has had normal bowel movements no significant constipation or diarrhea. Patient denies any dysuria, hematuria or significant nocturia. Patient denies any problems walking, swelling in the joints or loss of balance. Patient denies any skin changes,  loss of hair or loss of weight. Patient denies any excessive worrying or anxiety or significant depression. Patient denies any problems with insomnia. Patient denies excessive thirst, polyuria, polydipsia. Patient denies any swollen  glands, patient denies easy bruising or easy bleeding. Patient denies any recent infections, allergies or URI. Patient "s visual fields have not changed significantly in recent time.   PHYSICAL EXAM: BP (!) (P) 146/92 (BP Location: Left Arm, Patient Position: Sitting)   Pulse (P) 92   Resp (P) 16   Wt (P) 180 lb 12.8 oz (82 kg)   BMI (P) 32.03 kg/m  No evidence of adenopathy in the head and neck region is identified.  Well-developed well-nourished patient in NAD. HEENT reveals PERLA, EOMI, discs not visualized.  Oral cavity is clear. No oral mucosal lesions are identified. Neck is clear without evidence of cervical or supraclavicular adenopathy. Lungs are clear to A&P. Cardiac examination is essentially unremarkable with regular rate and rhythm without murmur rub or thrill. Abdomen is benign with no organomegaly or masses noted. Motor sensory and DTR levels are equal and symmetric in the upper and lower extremities. Cranial nerves II through XII are grossly intact. Proprioception is intact. No peripheral adenopathy or edema is identified. No motor or sensory levels are noted. Crude visual fields are within normal range.  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  I would plan on delivering 7000 cGy to the base of tongue and area of preop prior surgical etc. excision of hypermetabolic lymph node.  I would treat remainder of head and neck bilaterally up to 5400 cGy using IMRT dose painting technique I would choose IMRT to spare critical structures such as spinal cord oropharynx salivary glands.  Risks and benefits of treatment including increased dysphagia skin reaction fatigue alteration of blood counts loss of taste dryness of mouth  order were discussed in detail with the patient.  I have personally set up and ordered CT simulation for later this week.  Patient comprehends my treatment plan well.  I would like to take this opportunity to thank you for allowing me to participate in the care of your patient.[name], MD

## 2019-03-18 ENCOUNTER — Telehealth: Payer: Self-pay

## 2019-03-18 ENCOUNTER — Other Ambulatory Visit: Payer: Self-pay

## 2019-03-18 ENCOUNTER — Telehealth (INDEPENDENT_AMBULATORY_CARE_PROVIDER_SITE_OTHER): Payer: Self-pay

## 2019-03-18 NOTE — Telephone Encounter (Signed)
Spoke with the patient on 03/17/19 and she is now scheduled with Dr. Delana Meyer for a port placement on 03/24/19 with a 9:00 am arrival time to the [facility]. Patient will do covid testing on 03/22/19 between 12:30-2:30 pm at the [facility]. Pre-procedure instructions were discussed and will be mailed to the patient.

## 2019-03-18 NOTE — Telephone Encounter (Signed)
Faxed port orders to Dr Lucky Cowboy office and the patient has been schedule for [facility] placement 03/24/2019. Patient has been made aware.

## 2019-03-19 ENCOUNTER — Other Ambulatory Visit: Payer: Self-pay

## 2019-03-19 ENCOUNTER — Ambulatory Visit: Payer: Medicaid Other

## 2019-03-19 DIAGNOSIS — C01 Malignant neoplasm of base of tongue: Secondary | ICD-10-CM | POA: Insufficient documentation

## 2019-03-19 DIAGNOSIS — Z510 Encounter for antineoplastic radiation therapy: Secondary | ICD-10-CM | POA: Insufficient documentation

## 2019-03-20 NOTE — Progress Notes (Signed)
[facility]  [address] [place], [place] [address] Phone: [phone]  Fax: [phone]   Clinic Day:  03/23/2019  Referring physician: No ref. provider found  Chief Complaint: Connie Cameron is a 59 y.o. female with stage I squamous cell carcinoma of the left tongue base who is seen for further discussion regarding direction of therapy.   HPI:  The patient was last seen in the medical oncology clinic on 03/09/2019 for a telephone visit.  At that time, pathology was reviewed.  We discussed plan for concurrent chemotherapy and radiation.  Side effects of treatment were reviewed.  Radiation oncology was consulted.   She was seen by Dr. Baruch Gouty on 03/16/2019. Plan was to deliver 7,000 cGy to the base of tongue and area of prior surgical excision of the hypermetabolic lymph node. He plans to treat the remainder of her head and neck bilaterally up to 5,400 cGy using an IMRT dose painting technique.  Radiation is scheduled to receive radiation from 03/30/2019 - 05/17/2019.   She underwent audiogram on 03/11/2019.  Hearing was within normal limits in the right ear through 8 kHz with mild hearing loss at 12 kHz only.  Hearing was within normal limits in the left ear through 3 kHz with mild-severe hearing loss from 4-12 kHz.  Recommendation was repeat retesting with future treatments  She attended chemotherapy class on 03/15/2019.  She was seen by Beckey Rutter, NP on 03/16/2019.  She is schedule for port-a-cath placement on 03/24/2019 with Dr. Delana Meyer.  During the interim, she felt "anxious" about treatment.  She is having trouble sleeping.  Biopsy site on tongue healed.  She denies any sore throat, difficulty swallowing or ear pain.   Past Medical History:  Diagnosis Date  . Medical history non-contributory     Past Surgical History:  Procedure Laterality Date  . EXCISION MASS NECK Left 01/18/2019   Procedure: EXCISION MASS NECK/NODE;  Surgeon:  Margaretha Sheffield, MD;  Location: [facility] ORS;  Service: ENT;  Laterality: Left;  . LARYNGOSCOPY Bilateral 02/22/2019   Procedure: MICROSCOPIC DIRECT LARYNGOSCOPY AND BIOPSY;  Surgeon: Margaretha Sheffield, MD;  Location: [facility] ORS;  Service: ENT;  Laterality: Bilateral;  . TONSILLECTOMY    . TUBAL LIGATION      Family History  Problem Relation Age of Onset  . Breast cancer Sister   . Brain cancer Paternal Grandfather     Social History:  reports that she has never smoked. She has never used smokeless tobacco. She reports previous alcohol use. She reports that she does not use drugs. She use to drink years ago. She denies any exposure to radiation or toxins. 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 She lives in [place]. The patient is alone today.  Allergies: No Known Allergies  Current Medications: No current outpatient medications on file.   No current facility-administered medications for this visit.    Review of Systems  Constitutional: Negative.  Negative for chills, diaphoresis, fever, malaise/fatigue and weight loss.       Feels "good".  HENT: Positive for hearing loss (high frequency hearing loss left ear). Negative for congestion, ear discharge, ear pain, nosebleeds, sinus pain, sore throat and tinnitus.   Eyes: Negative for blurred vision (stable), double vision and photophobia.  Respiratory: Negative.  Negative for cough, hemoptysis, sputum production and shortness of breath.   Cardiovascular: Negative.  Negative for chest pain, palpitations and leg swelling.  Gastrointestinal: Negative.  Negative for  abdominal pain, blood in stool, constipation, diarrhea, heartburn, melena, nausea and vomiting.  Genitourinary: Negative.  Negative for dysuria, frequency, hematuria and urgency.  Musculoskeletal: Negative for back pain, joint pain, myalgias and neck pain.  Skin: Negative.  Negative for itching and rash.       Biopsy site base of tongue has healed.    Neurological: Negative.  Negative for dizziness, tingling, sensory change, speech change, focal weakness, weakness and headaches.  Endo/Heme/Allergies: Negative.  Does not bruise/bleed easily.  Psychiatric/Behavioral: Negative for depression and memory loss. The patient is nervous/anxious (anxious about treatment) and has insomnia.   All other systems reviewed and are negative.   Performance status (ECOG):  0  Vitals Blood pressure 132/89, pulse 81, temperature 97.6 F (36.4 C), temperature source Tympanic, resp. rate 18, weight 182 lb 15.7 oz (83 kg), SpO2 99 %.   Physical Exam  Constitutional: She is oriented to person, place, and time. She appears well-developed and well-nourished. No distress.  HENT:  Head: Normocephalic.  Mouth/Throat: Oropharynx is clear and moist. No oropharyngeal exudate.  Long brown/blonde hair.  Mask.  Eyes: Pupils are equal, round, and reactive to light. Conjunctivae and EOM are normal. No scleral icterus.  Blue eyes.  Cardiovascular: Normal rate, regular rhythm and normal heart sounds.  No murmur heard. Pulmonary/Chest: Effort normal and breath sounds normal. No respiratory distress. She has no wheezes. She has no rales. She exhibits no tenderness.  Abdominal: Soft. Bowel sounds are normal. She exhibits no distension and no mass. There is no abdominal tenderness. There is no rebound and no guarding.  Musculoskeletal:        General: No tenderness or edema. Normal range of motion.     Cervical back: Normal range of motion and neck supple.  Lymphadenopathy:       Head (right side): No preauricular, no posterior auricular and no occipital adenopathy present.       Head (left side): No preauricular and no posterior auricular adenopathy present.    She has no cervical adenopathy.    She has no axillary adenopathy.       Right: No inguinal and no supraclavicular adenopathy present.       Left: No inguinal and no supraclavicular adenopathy present.   Neurological: She is alert and oriented to person, place, and time.  Skin: Skin is warm and dry. She is not diaphoretic.  Tattoo on right side of neck.   Psychiatric: She has a normal mood and affect. Her behavior is normal. Judgment and thought content normal.  Nursing note and vitals reviewed.   No visits with results within 3 Day(s) from this visit.  Latest known visit with results is:  Admission on 02/22/2019, Discharged on 02/22/2019  Component Date Value Ref Range Status  . SURGICAL PATHOLOGY 02/22/2019    Final-Edited                   Value:SURGICAL PATHOLOGY CASE: [id number] PATIENT: Connie Cameron Surgical Pathology Report  Specimen Submitted: A. Tongue, left base  Clinical History: Malignant neoplasm base of tongue   DIAGNOSIS: A. TONGUE, LEFT BASE; BIOPSY: - DIAGNOSTIC OF MALIGNANCY. - POORLY DIFFERENTIATED SQUAMOUS CELL CARCINOMA; SEE COMMENT.  Comment: The morphologic features are identical those seen in the prior excisional biopsy of left neck mass [id number]).  Immunohistochemical stains were performed on block A1.  The malignant cells are positive for p16, p40, and pancytokeratin.  IHC slides were prepared by [facility], [place], [place]. All controls  stained appropriately.  This test was developed and its performance characteristics determined by [facility]. It has not been cleared or approved by the [place] Food and Drug Administration. The FDA does not require this test to go through premarket FDA review. This test is used for clinical purposes                         . It should not be regarded as investigational or for research. This laboratory is certified under the Clinical Laboratory Improvement Amendments (CLIA) as qualified to perform high complexity clinical laboratory testing.  GROSS DESCRIPTION: A. Labeled: Left base of the tongue Received: In formalin Tissue fragment(s): Multiple Size: Aggregate, 1.5 x 1.3 x  0.2 cm Description: Unoriented pink rubbery soft tissue fragments Entirely submitted in 1 cassette.  Final Diagnosis performed by Betsy Pries, MD.   Electronically signed 02/25/2019 4:33:12PM The electronic signature indicates that the named Attending Pathologist has evaluated the specimen Technical component performed at [facility], [address] [place], [place] [address] Lab: [phone] Dir: Rush Farmer, MD, MMM  Professional component performed at [facility], [facility], [place], [place], [place] [address] Lab: [phone] Dir: Dellia Nims. Rubinas, MD    Assessment:  Connie Cameron is a 59 y.o. female with clinical stage T1N1M0 left base of tongue poorly differentiated squamous cell carcinoma s/p biopsy on 02/22/2019.  Pathology revealed poorly differentiated squamous cell carcinoma.  Malignant cells are positive for p16, p40, and pancytokeratin.   She presented with left cervical adenopathy. 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  Node was 4.0 x 2.3 x 1.8 cm.  Neck CT on 12/21/2018 revealed a 2.4 x 2.1 x 3.4 cm lymph node in the left upper neck. The lymph node showed heterogeneous enhancement and stranding in the surrounding fat. There was a 6 mm left upper posterior lymph node and a 7 mm right level 3 lymph node. Imaging was concerning for malignancy. There were no other enlarged lymph nodes. There was no pharyngeal mass.   PET scan on 02/04/2019 revealed prior left level II cervical node had been excised. There were no findings on PET to suggest a site of primary head/neck cancer. There was symmetric hypermetabolism along the base of the tongue bilaterally favored to be physiologic. There was no evidence of metastatic disease.   Audiogram on 03/11/2019 revealed normal hearing in the right ear through 8 kHz with mild hearing loss at 12 kHz  only.  Hearing was within normal limits in the left ear through 3 kHz with mild-severe hearing loss from 4-12 kHz.  Recommendation was repeat retesting with future treatments  Symptomatically, she feels good.  Exam reveals no palpable adenopathy.  Plan: 1.   Stage I left base of tongue carcinoma  Patient has a >1 year history of left neck adenopathy.  Excisional lymph node biopsy on 01/18/2019 revealed poorly differentiated squamous cell carcinoma of head and neck origin. PET scan on 02/04/2019 revealed no evidence of primary site or metastatic disease. Left base of tongue biopsy on 02/22/2019 confirmed poorly differentiated squamous cell carcinoma.             Exam currently reveals no palpable adenopathy.  Review treatment plan:                         Cisplatin 100 mg/m2 IV every 3 weeks x  3 weeks with concurrent radiation.                         Potential side effects reviewed.  Discuss nausea, vomiting, high frequency hearing loss/tinnitus, electrolyte wasting and renal dysfunction.   Discuss importance of good hydration.   Discuss monitoring hearing closely.  Discuss antiemetics.   Patient provided Rxs for ondansetron, Decadron, and Ativan.   Patient counseled about not driving or operating heavy machinery when taking Ativan.   Patient asked to bring medications to first appointment for review.  Patient instructed on use of EMLA cream topically over port-a-cath site and applying 30 minutes before coming to clinic for treatment.  Discuss logistics of daily radiation and chemotherapy days (estimated 6 hours).   Discuss assessment prior to each cycle with oncologist in [place]. 2.   Port-a-cath placement on 03/24/2019 with Dr Delana Meyer. 3.   RTC on 03/29/2019 or 03/30/2019 in [place] with Dr Janese Banks for MD assessment.  Labs (CBC with diff, CMP, Mg) and cycle #1 cisplatin on 03/30/2019.  Radiation begins 03/30/2019. 4.   RTC on 04/06/2019  in [place] for MD assessment, labs (BMP, Mg), and +/- IVF.   I discussed the assessment and treatment plan with the patient.  The patient was provided an opportunity to ask questions and all were answered.  The patient agreed with the plan and demonstrated an understanding of the instructions.  The patient was advised to call back if the symptoms worsen or if the condition fails to improve as anticipated.  I provided 32 minutes of face-to-face time during this this encounter and > 50% was spent counseling as documented under my assessment and plan.    Lequita Asal, MD, PhD    03/23/2019, 11:32 AM  I, Selena Batten, am acting as scribe for [employer]. Mike Gip, MD, PhD.  I, Caterra Ostroff C. Mike Gip, MD, have reviewed the above documentation for accuracy and completeness, and I agree with the above.

## 2019-03-23 ENCOUNTER — Inpatient Hospital Stay (HOSPITAL_BASED_OUTPATIENT_CLINIC_OR_DEPARTMENT_OTHER): Payer: Medicaid Other | Admitting: Hematology and Oncology

## 2019-03-23 ENCOUNTER — Encounter: Payer: Self-pay | Admitting: Hematology and Oncology

## 2019-03-23 ENCOUNTER — Other Ambulatory Visit (INDEPENDENT_AMBULATORY_CARE_PROVIDER_SITE_OTHER): Payer: Self-pay | Admitting: Nurse Practitioner

## 2019-03-23 ENCOUNTER — Other Ambulatory Visit: Payer: Self-pay

## 2019-03-23 VITALS — BP 132/89 | HR 81 | Temp 97.6°F | Resp 18 | Wt 183.0 lb

## 2019-03-23 DIAGNOSIS — C01 Malignant neoplasm of base of tongue: Secondary | ICD-10-CM

## 2019-03-23 DIAGNOSIS — Z7189 Other specified counseling: Secondary | ICD-10-CM

## 2019-03-23 DIAGNOSIS — Z5111 Encounter for antineoplastic chemotherapy: Secondary | ICD-10-CM | POA: Diagnosis not present

## 2019-03-23 MED ORDER — ONDANSETRON HCL 8 MG PO TABS: 8.0000 mg | ORAL_TABLET | Freq: Two times a day (BID) | ORAL | 1 refills | Status: DC | PRN

## 2019-03-23 MED ORDER — DEXAMETHASONE 4 MG PO TABS: ORAL_TABLET | ORAL | 1 refills | Status: DC

## 2019-03-23 MED ORDER — LIDOCAINE-PRILOCAINE 2.5-2.5 % EX CREA: TOPICAL_CREAM | CUTANEOUS | 3 refills | Status: DC

## 2019-03-23 MED ORDER — LORAZEPAM 0.5 MG PO TABS: 0.5000 mg | ORAL_TABLET | Freq: Four times a day (QID) | ORAL | 0 refills | Status: DC | PRN

## 2019-03-23 NOTE — Progress Notes (Signed)
Patient is having trouble sleeping and anxious about starting treatment

## 2019-03-24 ENCOUNTER — Telehealth: Payer: Self-pay | Admitting: Oncology

## 2019-03-24 ENCOUNTER — Encounter
Admission: RE | Disposition: A | Discharge status: Home / Self Care | Payer: Self-pay | Source: Home / Self Care | Attending: Vascular Surgery

## 2019-03-24 ENCOUNTER — Ambulatory Visit: Payer: Self-pay | Admitting: Hematology and Oncology

## 2019-03-24 ENCOUNTER — Ambulatory Visit
Admission: RE | Admit: 2019-03-24 | Discharge: 2019-03-24 | Disposition: A | Discharge status: Home / Self Care | Payer: Medicaid Other | Attending: Vascular Surgery | Admitting: Vascular Surgery

## 2019-03-24 ENCOUNTER — Encounter: Payer: Self-pay | Admitting: Vascular Surgery

## 2019-03-24 DIAGNOSIS — Z808 Family history of malignant neoplasm of other organs or systems: Secondary | ICD-10-CM | POA: Diagnosis not present

## 2019-03-24 DIAGNOSIS — C01 Malignant neoplasm of base of tongue: Secondary | ICD-10-CM | POA: Insufficient documentation

## 2019-03-24 DIAGNOSIS — Z9851 Tubal ligation status: Secondary | ICD-10-CM | POA: Insufficient documentation

## 2019-03-24 DIAGNOSIS — Z803 Family history of malignant neoplasm of breast: Secondary | ICD-10-CM | POA: Insufficient documentation

## 2019-03-24 DIAGNOSIS — D490 Neoplasm of unspecified behavior of digestive system: Secondary | ICD-10-CM

## 2019-03-24 HISTORY — PX: PORTA CATH INSERTION: CATH118285

## 2019-03-24 SURGERY — PORTA CATH INSERTION
Anesthesia: Moderate Sedation

## 2019-03-24 MED ORDER — SODIUM CHLORIDE 0.9 % IV SOLN: INTRAVENOUS | Status: DC

## 2019-03-24 MED ORDER — LIDOCAINE-EPINEPHRINE (PF) 1 %-1:200000 IJ SOLN
INTRAMUSCULAR | Status: AC
  Filled 2019-03-24: qty 10

## 2019-03-24 MED ORDER — FENTANYL CITRATE (PF) 100 MCG/2ML IJ SOLN
INTRAMUSCULAR | Status: AC
  Filled 2019-03-24: qty 2

## 2019-03-24 MED ORDER — METHYLPREDNISOLONE SODIUM SUCC 125 MG IJ SOLR: 125.0000 mg | Freq: Once | INTRAMUSCULAR | Status: DC | PRN

## 2019-03-24 MED ORDER — CEFAZOLIN SODIUM-DEXTROSE 2-4 GM/100ML-% IV SOLN
INTRAVENOUS | Status: AC
  Administered 2019-03-24: 2 g via INTRAVENOUS
  Filled 2019-03-24: qty 100

## 2019-03-24 MED ORDER — HYDROMORPHONE HCL 1 MG/ML IJ SOLN: 1.0000 mg | Freq: Once | INTRAMUSCULAR | Status: DC | PRN

## 2019-03-24 MED ORDER — MIDAZOLAM HCL 2 MG/2ML IJ SOLN
INTRAMUSCULAR | Status: DC | PRN
  Administered 2019-03-24: 2 mg via INTRAVENOUS
  Administered 2019-03-24: 1 mg via INTRAVENOUS

## 2019-03-24 MED ORDER — ONDANSETRON HCL 4 MG/2ML IJ SOLN: 4.0000 mg | Freq: Four times a day (QID) | INTRAMUSCULAR | Status: DC | PRN

## 2019-03-24 MED ORDER — DIPHENHYDRAMINE HCL 50 MG/ML IJ SOLN: 50.0000 mg | Freq: Once | INTRAMUSCULAR | Status: DC | PRN

## 2019-03-24 MED ORDER — HEPARIN (PORCINE) IN NACL 1000-0.9 UT/500ML-% IV SOLN
INTRAVENOUS | Status: AC
  Filled 2019-03-24: qty 1000

## 2019-03-24 MED ORDER — MIDAZOLAM HCL 2 MG/ML PO SYRP: 8.0000 mg | ORAL_SOLUTION | Freq: Once | ORAL | Status: DC | PRN

## 2019-03-24 MED ORDER — MIDAZOLAM HCL 2 MG/2ML IJ SOLN
INTRAMUSCULAR | Status: AC
  Filled 2019-03-24: qty 2

## 2019-03-24 MED ORDER — CEFAZOLIN SODIUM-DEXTROSE 2-4 GM/100ML-% IV SOLN: 2.0000 g | Freq: Once | INTRAVENOUS | Status: AC

## 2019-03-24 MED ORDER — HEPARIN (PORCINE) IN NACL 1000-0.9 UT/500ML-% IV SOLN
INTRAVENOUS | Status: DC | PRN
  Administered 2019-03-24: 500 mL

## 2019-03-24 MED ORDER — FENTANYL CITRATE (PF) 100 MCG/2ML IJ SOLN
INTRAMUSCULAR | Status: DC | PRN
  Administered 2019-03-24 (×2): 50 ug via INTRAVENOUS

## 2019-03-24 MED ORDER — FAMOTIDINE 20 MG PO TABS: 40.0000 mg | ORAL_TABLET | Freq: Once | ORAL | Status: DC | PRN

## 2019-03-24 SURGICAL SUPPLY — 9 items
DERMABOND ADVANCED (GAUZE/BANDAGES/DRESSINGS) ×1
DERMABOND ADVANCED .7 DNX12 (GAUZE/BANDAGES/DRESSINGS) ×1 IMPLANT
DRAPE INCISE IOBAN 66X45 STRL (DRAPES) ×2 IMPLANT
KIT PORT POWER 8FR ISP CVUE (Port) ×2 IMPLANT
PACK ANGIOGRAPHY (CUSTOM PROCEDURE TRAY) ×2 IMPLANT
SUT MNCRL AB 4-0 PS2 18 (SUTURE) ×2 IMPLANT
SUT PROLENE 0 CT 1 30 (SUTURE) ×2 IMPLANT
SUT VIC AB 3-0 SH 27 (SUTURE) ×1
SUT VIC AB 3-0 SH 27X BRD (SUTURE) ×1 IMPLANT

## 2019-03-24 NOTE — Telephone Encounter (Signed)
Dr. Janese Banks will be seeing patient during duration of radiation. Appts were scheduled and voicemail left with patient for appt on 03-30-19. Appts will be printed and left at the radiation desk for patient to pick up on 03-29-19.

## 2019-03-24 NOTE — H&P (Signed)
[facility] History & Physical Update  The patient was interviewed and re-examined.  The patient's previous History and Physical has been reviewed and is unchanged.  There is no change in the plan of care. We plan to proceed with the scheduled procedure.  Patient has biopsy-proven squamous cell carcinoma of the left tongue base.  She currently is undergoing chemotherapy for treatment of her cancer and therefore requires appropriate parenteral access.  Risks and benefits for port have been reviewed and she has agreed to proceed.   Hortencia Pilar, MD  03/24/2019, 10:26 AM

## 2019-03-24 NOTE — Op Note (Signed)
OPERATIVE NOTE   PROCEDURE: 1. Placement of a right IJ Infuse-a-Port  PRE-OPERATIVE DIAGNOSIS: Squamous cell carcinoma of the tongue  POST-OPERATIVE DIAGNOSIS: Same  SURGEON: Katha Cabal M.D.  ANESTHESIA: Conscious sedation was administered under my direct supervision by the interventional radiology RN. IV Versed plus fentanyl were utilized. Continuous ECG, pulse oximetry and blood pressure was monitored throughout the entire procedure. Conscious sedation was for a total of 30 minutes.  ESTIMATED BLOOD LOSS: Minimal   FINDING(S): 1.  Patent vein  SPECIMEN(S): None  INDICATIONS:   Connie Cameron is a 59 y.o. female who presents with biopsy-proven squamous cell carcinoma of the tongue.  She requires chemotherapy and therefore appropriate parenteral access.  Risk and benefits of been reviewed patient wishes to proceed with Infuse-a-Port placement..  DESCRIPTION: After obtaining full informed written consent, the patient was brought back to the special procedure suite and placed in the supine position. The patient's right neck and chest wall are prepped and draped in sterile fashion. Appropriate timeout was called.  Ultrasound is placed in a sterile sleeve, ultrasound is utilized to avoid vascular injury as well as secondary to lack of appropriate landmarks. The right internal jugular vein is identified. It is echolucent and homogeneous as well as easily compressible indicating patency. An image is recorded for the permanent record.  Access to the vein with a micropuncture needle is done under direct ultrasound visualization.  1% lidocaine is infiltrated into the soft tissue at the base of the neck as well as on the chest wall.  Under direct ultrasound visualization a micro-needle is inserted into the vein followed by the micro-wire. Micro-sheath was then advanced and a J wire is inserted without difficulty under fluoroscopic guidance. A small counterincision was created at the wire  insertion site. A transverse incision is created 2 fingerbreadths below the scapula and a pocket is fashioned using both blunt and sharp dissection. The pocket is tested for appropriate size with the hub of the Infuse-a-Port. The tunneling device is then used to pull the intravascular portion of the catheter from the pocket to the neck counterincision.  Dilator and peel-away sheath were then inserted over the wire and the wire is removed. Catheter is then advanced into the venous system without difficulty. Peel-away sheath was then removed.  Catheter is then positioned under fluoroscopic guidance at the atrial caval junction. It is then transected connected to the hub and the hope is slipped into the subcutaneous pocket on the chest wall. The hub was then accessed percutaneously and aspirates easily and flushes well and is flushed with 30 cc of heparinized saline. The pocket incision is then closed in layers using interrupted 3-0 Vicryl for the subcutaneous tissues and 4-0 Monocryl subcuticular for skin closure. Dermabond is applied. The neck counterincision was closed with 4-0 Monocryl subcuticular and Dermabond as well.  The patient tolerated the procedure well and there were no immediate complications.  COMPLICATIONS: None  CONDITION: Unchanged  Katha Cabal M.D. [facility] Office: [phone]   03/24/2019, 11:06 AM

## 2019-03-25 DIAGNOSIS — Z510 Encounter for antineoplastic radiation therapy: Secondary | ICD-10-CM | POA: Diagnosis not present

## 2019-03-29 ENCOUNTER — Ambulatory Visit: Payer: Self-pay

## 2019-03-29 ENCOUNTER — Other Ambulatory Visit: Payer: Self-pay

## 2019-03-29 ENCOUNTER — Ambulatory Visit: Payer: Self-pay | Admitting: Oncology

## 2019-03-29 ENCOUNTER — Ambulatory Visit
Admission: RE | Admit: 2019-03-29 | Discharge: 2019-03-29 | Disposition: A | Discharge status: Home / Self Care | Payer: Medicaid Other | Source: Ambulatory Visit | Attending: Radiation Oncology | Admitting: Radiation Oncology

## 2019-03-30 ENCOUNTER — Ambulatory Visit
Admission: RE | Admit: 2019-03-30 | Discharge: 2019-03-30 | Disposition: A | Discharge status: Home / Self Care | Payer: Medicaid Other | Source: Ambulatory Visit | Attending: Radiation Oncology | Admitting: Radiation Oncology

## 2019-03-30 ENCOUNTER — Inpatient Hospital Stay: Payer: Medicaid Other

## 2019-03-30 ENCOUNTER — Encounter: Payer: Self-pay | Admitting: Oncology

## 2019-03-30 ENCOUNTER — Inpatient Hospital Stay (HOSPITAL_BASED_OUTPATIENT_CLINIC_OR_DEPARTMENT_OTHER): Payer: Medicaid Other | Admitting: Oncology

## 2019-03-30 ENCOUNTER — Other Ambulatory Visit: Payer: Self-pay

## 2019-03-30 VITALS — BP 147/99 | HR 94 | Temp 96.7°F | Wt 182.2 lb

## 2019-03-30 DIAGNOSIS — Z510 Encounter for antineoplastic radiation therapy: Secondary | ICD-10-CM | POA: Diagnosis not present

## 2019-03-30 DIAGNOSIS — Z5111 Encounter for antineoplastic chemotherapy: Secondary | ICD-10-CM | POA: Diagnosis not present

## 2019-03-30 DIAGNOSIS — C01 Malignant neoplasm of base of tongue: Secondary | ICD-10-CM

## 2019-03-30 LAB — CBC WITH DIFFERENTIAL/PLATELET
Abs Immature Granulocytes: 0.02 10*3/uL (ref 0.00–0.07)
Basophils Absolute: 0 10*3/uL (ref 0.0–0.1)
Basophils Relative: 0 %
Eosinophils Absolute: 0.2 10*3/uL (ref 0.0–0.5)
Eosinophils Relative: 2 %
HCT: 39.4 % (ref 36.0–46.0)
Hemoglobin: 12.7 g/dL (ref 12.0–15.0)
Immature Granulocytes: 0 %
Lymphocytes Relative: 19 %
Lymphs Abs: 1.5 10*3/uL (ref 0.7–4.0)
MCH: 29.3 pg (ref 26.0–34.0)
MCHC: 32.2 g/dL (ref 30.0–36.0)
MCV: 91 fL (ref 80.0–100.0)
Monocytes Absolute: 0.6 10*3/uL (ref 0.1–1.0)
Monocytes Relative: 7 %
Neutro Abs: 5.9 10*3/uL (ref 1.7–7.7)
Neutrophils Relative %: 72 %
Platelets: 263 10*3/uL (ref 150–400)
RBC: 4.33 MIL/uL (ref 3.87–5.11)
RDW: 12.7 % (ref 11.5–15.5)
WBC: 8.2 10*3/uL (ref 4.0–10.5)
nRBC: 0 % (ref 0.0–0.2)

## 2019-03-30 LAB — COMPREHENSIVE METABOLIC PANEL
ALT: 16 U/L (ref 0–44)
AST: 23 U/L (ref 15–41)
Albumin: 4.4 g/dL (ref 3.5–5.0)
Alkaline Phosphatase: 81 U/L (ref 38–126)
Anion gap: 10 (ref 5–15)
BUN: 19 mg/dL (ref 6–20)
CO2: 24 mmol/L (ref 22–32)
Calcium: 9.6 mg/dL (ref 8.9–10.3)
Chloride: 106 mmol/L (ref 98–111)
Creatinine, Ser: 0.79 mg/dL (ref 0.44–1.00)
GFR calc Af Amer: >60 mL/min (ref >60)
GFR calc non Af Amer: >60 mL/min (ref >60)
Glucose, Bld: 102 mg/dL — ABNORMAL HIGH (ref 70–99)
Potassium: 4 mmol/L (ref 3.5–5.1)
Sodium: 140 mmol/L (ref 135–145)
Total Bilirubin: 0.5 mg/dL (ref 0.3–1.2)
Total Protein: 8 g/dL (ref 6.5–8.1)

## 2019-03-30 LAB — MAGNESIUM: Magnesium: 2.1 mg/dL (ref 1.7–2.4)

## 2019-03-30 MED ORDER — SODIUM CHLORIDE 0.9 % IV SOLN
INTRAVENOUS | Status: DC
  Filled 2019-03-30: qty 250

## 2019-03-30 MED ORDER — HEPARIN SOD (PORK) LOCK FLUSH 100 UNIT/ML IV SOLN
500.0000 [IU] | Freq: Once | INTRAVENOUS | Status: AC
  Administered 2019-03-30: 500 [IU] via INTRAVENOUS
  Filled 2019-03-30: qty 5

## 2019-03-30 MED ORDER — SODIUM CHLORIDE 0.9 % IV SOLN
100.0000 mg/m2 | Freq: Once | INTRAVENOUS | Status: AC
  Administered 2019-03-30: 192 mg via INTRAVENOUS
  Filled 2019-03-30: qty 192

## 2019-03-30 MED ORDER — SODIUM CHLORIDE 0.9 % IV SOLN
10.0000 mg | Freq: Once | INTRAVENOUS | Status: AC
  Administered 2019-03-30: 10 mg via INTRAVENOUS
  Filled 2019-03-30: qty 1

## 2019-03-30 MED ORDER — SODIUM CHLORIDE 0.9% FLUSH
10.0000 mL | Freq: Once | INTRAVENOUS | Status: AC
  Administered 2019-03-30: 10 mL via INTRAVENOUS
  Filled 2019-03-30: qty 10

## 2019-03-30 MED ORDER — PALONOSETRON HCL INJECTION 0.25 MG/5ML
0.2500 mg | Freq: Once | INTRAVENOUS | Status: AC
  Administered 2019-03-30: 0.25 mg via INTRAVENOUS
  Filled 2019-03-30: qty 5

## 2019-03-30 MED ORDER — POTASSIUM CHLORIDE 2 MEQ/ML IV SOLN
Freq: Once | INTRAVENOUS | Status: AC
  Filled 2019-03-30: qty 1000

## 2019-03-30 MED ORDER — SODIUM CHLORIDE 0.9 % IV SOLN
150.0000 mg | Freq: Once | INTRAVENOUS | Status: AC
  Administered 2019-03-30: 150 mg via INTRAVENOUS
  Filled 2019-03-30: qty 5

## 2019-03-30 MED ORDER — HEPARIN SOD (PORK) LOCK FLUSH 100 UNIT/ML IV SOLN
500.0000 [IU] | Freq: Once | INTRAVENOUS | Status: AC | PRN
  Administered 2019-03-30: 500 [IU]
  Filled 2019-03-30: qty 5

## 2019-03-30 MED ORDER — SODIUM CHLORIDE 0.9 % IV SOLN: 100.0000 mg/m2 | Freq: Once | INTRAVENOUS | Status: DC

## 2019-03-30 NOTE — Progress Notes (Signed)
Patient stated that she had been doing well overall. However, patient had not been able to sleep at nighttime due to anxiety.

## 2019-03-31 ENCOUNTER — Ambulatory Visit
Admission: RE | Admit: 2019-03-31 | Discharge: 2019-03-31 | Disposition: A | Discharge status: Home / Self Care | Payer: Medicaid Other | Source: Ambulatory Visit | Attending: Radiation Oncology | Admitting: Radiation Oncology

## 2019-03-31 ENCOUNTER — Other Ambulatory Visit: Payer: Self-pay

## 2019-03-31 ENCOUNTER — Telehealth: Payer: Self-pay

## 2019-03-31 NOTE — Telephone Encounter (Signed)
Telephone call to pt for follow up after receiving first infusion.  Pt states feeling fatigue but otherwise treatment went well yesterday.   States is eating and drinking fluids well.   Encourage pt to call for any questions or concerns.

## 2019-04-01 ENCOUNTER — Telehealth: Payer: Self-pay

## 2019-04-01 ENCOUNTER — Ambulatory Visit
Admission: RE | Admit: 2019-04-01 | Discharge: 2019-04-01 | Disposition: A | Discharge status: Home / Self Care | Payer: Medicaid Other | Source: Ambulatory Visit | Attending: Radiation Oncology | Admitting: Radiation Oncology

## 2019-04-01 ENCOUNTER — Other Ambulatory Visit: Payer: Self-pay

## 2019-04-01 NOTE — Telephone Encounter (Signed)
Nutrition Assessment   Reason for Assessment:   Referral from [facility], Head and Neck cancer   ASSESSMENT:  59 year old female with new diagnosis of base of the tongue cancer.  Patient receiving radiation and chemotherapy (cisplatin).  Patient followed by Dr. Janese Banks.  Spoke with patient and currently eating chicken salad sandwich.  Reports that she is able to tolerate any foods that she wants.  Denies sore mouth or pain on swallowing.     Medications: ativan, decadron, zofran   Labs: reviewed   Anthropometrics:   Weight 182 lb on 2/23 noted weight increase from 175 lb on 2/17  02/22/19 154 lb   Estimated Energy Needs  Kcals: [id number] Protein: 100-124 g Fluid: > 1.8 L   NUTRITION DIAGNOSIS: Predicted suboptimal energy intake related to planned radiation and chemotherapy treatment and associated side effects   INTERVENTION:  Discussed RD's role in care and importance of good nutrition during treatment. Encouraged good sources of protein and discussed this briefly with patient.  Patient has contact information   MONITORING, EVALUATION, GOAL: Patient will consume adequate calories and protein during treatment to maintain weight   Next Visit: March 11th after radiation, patient informed  Connie Cameron B. Zenia Resides, [place], [place] Registered Dietitian [phone] (pager)

## 2019-04-01 NOTE — Telephone Encounter (Signed)
Nutrition  Referral from RN, [facility] regarding new head and neck cancer patient.    Called patient this afternoon but no answer.  Left message on voicemail with call back number.   Joliene Salvador B. Zenia Resides, [place], [place] Registered Dietitian [phone] (pager)

## 2019-04-02 ENCOUNTER — Telehealth: Payer: Self-pay | Admitting: Nurse Practitioner

## 2019-04-02 ENCOUNTER — Ambulatory Visit
Admission: RE | Admit: 2019-04-02 | Discharge: 2019-04-02 | Disposition: A | Discharge status: Home / Self Care | Payer: Medicaid Other | Source: Ambulatory Visit | Attending: Radiation Oncology | Admitting: Radiation Oncology

## 2019-04-02 ENCOUNTER — Inpatient Hospital Stay
Admission: EM | Admit: 2019-04-02 | Discharge: 2019-04-06 | DRG: 603 | Disposition: A | Discharge status: Home / Self Care | Payer: Medicaid Other | Source: Ambulatory Visit | Attending: Internal Medicine | Admitting: Internal Medicine

## 2019-04-02 ENCOUNTER — Emergency Department: Payer: Medicaid Other

## 2019-04-02 ENCOUNTER — Telehealth: Payer: Self-pay | Admitting: *Deleted

## 2019-04-02 ENCOUNTER — Other Ambulatory Visit: Payer: Self-pay

## 2019-04-02 DIAGNOSIS — L03312 Cellulitis of back [any part except buttock]: Secondary | ICD-10-CM | POA: Diagnosis present

## 2019-04-02 DIAGNOSIS — T380X5A Adverse effect of glucocorticoids and synthetic analogues, initial encounter: Secondary | ICD-10-CM | POA: Diagnosis present

## 2019-04-02 DIAGNOSIS — Z923 Personal history of irradiation: Secondary | ICD-10-CM

## 2019-04-02 DIAGNOSIS — E872 Acidosis: Secondary | ICD-10-CM | POA: Diagnosis present

## 2019-04-02 DIAGNOSIS — R739 Hyperglycemia, unspecified: Secondary | ICD-10-CM | POA: Diagnosis present

## 2019-04-02 DIAGNOSIS — E876 Hypokalemia: Secondary | ICD-10-CM | POA: Diagnosis present

## 2019-04-02 DIAGNOSIS — L03221 Cellulitis of neck: Secondary | ICD-10-CM | POA: Diagnosis present

## 2019-04-02 DIAGNOSIS — Z803 Family history of malignant neoplasm of breast: Secondary | ICD-10-CM

## 2019-04-02 DIAGNOSIS — L03313 Cellulitis of chest wall: Principal | ICD-10-CM | POA: Diagnosis present

## 2019-04-02 DIAGNOSIS — C01 Malignant neoplasm of base of tongue: Secondary | ICD-10-CM | POA: Diagnosis present

## 2019-04-02 DIAGNOSIS — C4442 Squamous cell carcinoma of skin of scalp and neck: Secondary | ICD-10-CM | POA: Diagnosis present

## 2019-04-02 DIAGNOSIS — L03319 Cellulitis of trunk, unspecified: Secondary | ICD-10-CM

## 2019-04-02 DIAGNOSIS — Z9221 Personal history of antineoplastic chemotherapy: Secondary | ICD-10-CM

## 2019-04-02 DIAGNOSIS — C760 Malignant neoplasm of head, face and neck: Secondary | ICD-10-CM

## 2019-04-02 DIAGNOSIS — L0390 Cellulitis, unspecified: Secondary | ICD-10-CM

## 2019-04-02 DIAGNOSIS — Z808 Family history of malignant neoplasm of other organs or systems: Secondary | ICD-10-CM

## 2019-04-02 LAB — COMPREHENSIVE METABOLIC PANEL
ALT: 18 U/L (ref 0–44)
AST: 20 U/L (ref 15–41)
Albumin: 4.2 g/dL (ref 3.5–5.0)
Alkaline Phosphatase: 78 U/L (ref 38–126)
Anion gap: 11 (ref 5–15)
BUN: 34 mg/dL — ABNORMAL HIGH (ref 6–20)
CO2: 22 mmol/L (ref 22–32)
Calcium: 9.4 mg/dL (ref 8.9–10.3)
Chloride: 103 mmol/L (ref 98–111)
Creatinine, Ser: 0.77 mg/dL (ref 0.44–1.00)
GFR calc Af Amer: >60 mL/min (ref >60)
GFR calc non Af Amer: >60 mL/min (ref >60)
Glucose, Bld: 249 mg/dL — ABNORMAL HIGH (ref 70–99)
Potassium: 4.5 mmol/L (ref 3.5–5.1)
Sodium: 136 mmol/L (ref 135–145)
Total Bilirubin: 0.8 mg/dL (ref 0.3–1.2)
Total Protein: 7.8 g/dL (ref 6.5–8.1)

## 2019-04-02 LAB — URINALYSIS, COMPLETE (UACMP) WITH MICROSCOPIC
Bilirubin Urine: NEGATIVE
Glucose, UA: NEGATIVE mg/dL
Hgb urine dipstick: NEGATIVE
Ketones, ur: NEGATIVE mg/dL
Leukocytes,Ua: NEGATIVE
Nitrite: NEGATIVE
Protein, ur: NEGATIVE mg/dL
Specific Gravity, Urine: 1.008 (ref 1.005–1.030)
pH: 5 (ref 5.0–8.0)

## 2019-04-02 LAB — LACTIC ACID, PLASMA
Lactic Acid, Venous: 2.6 mmol/L (ref 0.5–1.9)
Lactic Acid, Venous: 2.7 mmol/L (ref 0.5–1.9)

## 2019-04-02 LAB — CBC WITH DIFFERENTIAL/PLATELET
Abs Immature Granulocytes: 0.23 10*3/uL — ABNORMAL HIGH (ref 0.00–0.07)
Basophils Absolute: 0 10*3/uL (ref 0.0–0.1)
Basophils Relative: 0 %
Eosinophils Absolute: 0 10*3/uL (ref 0.0–0.5)
Eosinophils Relative: 0 %
HCT: 40.7 % (ref 36.0–46.0)
Hemoglobin: 13.6 g/dL (ref 12.0–15.0)
Immature Granulocytes: 1 %
Lymphocytes Relative: 2 %
Lymphs Abs: 0.4 10*3/uL — ABNORMAL LOW (ref 0.7–4.0)
MCH: 29.6 pg (ref 26.0–34.0)
MCHC: 33.4 g/dL (ref 30.0–36.0)
MCV: 88.5 fL (ref 80.0–100.0)
Monocytes Absolute: 1.1 10*3/uL — ABNORMAL HIGH (ref 0.1–1.0)
Monocytes Relative: 5 %
Neutro Abs: 21.6 10*3/uL — ABNORMAL HIGH (ref 1.7–7.7)
Neutrophils Relative %: 92 %
Platelets: 295 10*3/uL (ref 150–400)
RBC: 4.6 MIL/uL (ref 3.87–5.11)
RDW: 12.7 % (ref 11.5–15.5)
WBC: 23.4 10*3/uL — ABNORMAL HIGH (ref 4.0–10.5)
nRBC: 0 % (ref 0.0–0.2)

## 2019-04-02 MED ORDER — MORPHINE SULFATE (PF) 4 MG/ML IV SOLN
4.0000 mg | INTRAVENOUS | Status: DC | PRN
  Administered 2019-04-03 – 2019-04-05 (×2): 4 mg via INTRAVENOUS
  Filled 2019-04-02 (×3): qty 1

## 2019-04-02 MED ORDER — ACETAMINOPHEN 650 MG RE SUPP: 650.0000 mg | Freq: Four times a day (QID) | RECTAL | Status: DC | PRN

## 2019-04-02 MED ORDER — IOHEXOL 300 MG/ML  SOLN
75.0000 mL | Freq: Once | INTRAMUSCULAR | Status: AC | PRN
  Administered 2019-04-02: 75 mL via INTRAVENOUS

## 2019-04-02 MED ORDER — SODIUM CHLORIDE 0.9 % IV SOLN: INTRAVENOUS | Status: DC

## 2019-04-02 MED ORDER — ACETAMINOPHEN 325 MG PO TABS
650.0000 mg | ORAL_TABLET | Freq: Four times a day (QID) | ORAL | Status: DC | PRN
  Administered 2019-04-04: 650 mg via ORAL
  Filled 2019-04-02: qty 2

## 2019-04-02 MED ORDER — SODIUM CHLORIDE 0.9 % IV SOLN
2.0000 g | Freq: Three times a day (TID) | INTRAVENOUS | Status: DC
  Administered 2019-04-03 – 2019-04-06 (×10): 2 g via INTRAVENOUS
  Filled 2019-04-02 (×12): qty 2

## 2019-04-02 MED ORDER — ENOXAPARIN SODIUM 40 MG/0.4ML ~~LOC~~ SOLN
40.0000 mg | SUBCUTANEOUS | Status: DC
  Administered 2019-04-03 – 2019-04-06 (×4): 40 mg via SUBCUTANEOUS
  Filled 2019-04-02 (×4): qty 0.4

## 2019-04-02 MED ORDER — SODIUM CHLORIDE 0.9 % IV BOLUS
1000.0000 mL | Freq: Once | INTRAVENOUS | Status: AC
  Administered 2019-04-02: 1000 mL via INTRAVENOUS

## 2019-04-02 MED ORDER — TRAMADOL HCL 50 MG PO TABS
50.0000 mg | ORAL_TABLET | Freq: Four times a day (QID) | ORAL | Status: DC | PRN
  Administered 2019-04-03: 50 mg via ORAL
  Filled 2019-04-02: qty 1

## 2019-04-02 MED ORDER — VANCOMYCIN HCL IN DEXTROSE 1-5 GM/200ML-% IV SOLN
1000.0000 mg | Freq: Once | INTRAVENOUS | Status: AC
  Administered 2019-04-02: 1000 mg via INTRAVENOUS
  Filled 2019-04-02: qty 200

## 2019-04-02 MED ORDER — METRONIDAZOLE IN NACL 5-0.79 MG/ML-% IV SOLN
500.0000 mg | Freq: Once | INTRAVENOUS | Status: AC
  Administered 2019-04-02: 500 mg via INTRAVENOUS
  Filled 2019-04-02: qty 100

## 2019-04-02 MED ORDER — SODIUM CHLORIDE 0.9% FLUSH
3.0000 mL | Freq: Once | INTRAVENOUS | Status: AC
  Administered 2019-04-02: 3 mL via INTRAVENOUS

## 2019-04-02 MED ORDER — HYDROMORPHONE HCL 1 MG/ML IJ SOLN
1.0000 mg | Freq: Once | INTRAMUSCULAR | Status: AC
  Administered 2019-04-02: 1 mg via INTRAVENOUS
  Filled 2019-04-02: qty 1

## 2019-04-02 MED ORDER — SODIUM CHLORIDE 0.9 % IV SOLN
2.0000 g | Freq: Once | INTRAVENOUS | Status: AC
  Administered 2019-04-02: 2 g via INTRAVENOUS
  Filled 2019-04-02: qty 2

## 2019-04-02 MED ORDER — KETOROLAC TROMETHAMINE 30 MG/ML IJ SOLN
15.0000 mg | INTRAMUSCULAR | Status: AC
  Administered 2019-04-02: 15 mg via INTRAVENOUS
  Filled 2019-04-02: qty 1

## 2019-04-02 NOTE — Telephone Encounter (Signed)
Returned patient's call regarding pain and swelling at and above her port site. She says area is hot, swollen, tender, and makes movement difficult and unrelieved by pain medication. Unable to do video visit to assess more fully. Referred patient to er to be evaluated for possible clot. Advised patient's medical oncologist, Dr.Corcoran of this. Patient agreeable to plan.

## 2019-04-02 NOTE — ED Triage Notes (Signed)
PT to ED via POV c/o swelling to area between neck and shoulder. Area is red and warm to touch and painful for pt. PT has hx of throat cancer, had radiation therapy this morning, just started this week. PT took tylenol this morning with pain going from 10 to 7. PT's cancer dr wanted her checked for blood clot.

## 2019-04-02 NOTE — ED Provider Notes (Signed)
[facility] Emergency Department Provider Note  ____________________________________________  Time seen: Approximately 9:04 PM  I have reviewed the triage vital signs and the nursing notes.   HISTORY  Chief Complaint Edema    HPI DIA LUC is a 59 y.o. female with a history of carcinoma of base of tongue, on chemotherapy and radiation therapy who comes the ED due to redness pain swelling around the right upper chest wall and neck.  Denies fever.  Rates the pain 10 out of 10.  Nonradiating.  No aggravating factors, improve somewhat by taking Tylenol at home.  No recent trauma to the area.  No arm swelling or pain.     Past Medical History:  Diagnosis Date  . Medical history non-contributory   . Throat cancer [facility]) 2021     Patient Active Problem List   Diagnosis Date Noted  . Goals of care, counseling/discussion 02/28/2019  . 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 Carcinoma of base of tongue ([place]) 02/04/2019  . Lymphadenopathy, cervical 12/28/2018  . Acute leg pain, left 04/22/2014  . Atypical chest pain 04/22/2014  . Hx of syncope 04/22/2014  . SOB (shortness of breath) 04/22/2014     Past Surgical History:  Procedure Laterality Date  . EXCISION MASS NECK Left 01/18/2019   Procedure: EXCISION MASS NECK/NODE;  Surgeon: Margaretha Sheffield, MD;  Location: [facility] ORS;  Service: ENT;  Laterality: Left;  . LARYNGOSCOPY Bilateral 02/22/2019   Procedure: MICROSCOPIC DIRECT LARYNGOSCOPY AND BIOPSY;  Surgeon: Margaretha Sheffield, MD;  Location: [facility] ORS;  Service: ENT;  Laterality: Bilateral;  . PORTA CATH INSERTION N/A 03/24/2019   Procedure: PORTA CATH INSERTION;  Surgeon: Katha Cabal, MD;  Location: [facility];  Service: Cardiovascular;  Laterality: N/A;  . TONSILLECTOMY    . TUBAL LIGATION       Prior to Admission medications   Medication Sig Start Date End Date Taking? Authorizing Provider  dexamethasone  (DECADRON) 4 MG tablet Take 2 tablets by mouth once a day on the day after chemotherapy and then take 2 tablets two times a day for 2 days. Take with food. 03/23/19   Lequita Asal, MD  lidocaine-prilocaine (EMLA) cream Apply to affected area once 03/23/19   Lequita Asal, MD  LORazepam (ATIVAN) 0.5 MG tablet Take 1 tablet (0.5 mg total) by mouth every 6 (six) hours as needed (Nausea or vomiting). Patient not taking: Reported on 03/30/2019 03/23/19   Lequita Asal, MD  ondansetron (ZOFRAN) 8 MG tablet Take 1 tablet (8 mg total) by mouth 2 (two) times daily as needed. Start on the third day after chemotherapy. Patient not taking: Reported on 03/30/2019 03/23/19   Lequita Asal, MD     Allergies Patient has no known allergies.   Family History  Problem Relation Age of Onset  . Breast cancer Sister   . Brain cancer Paternal Grandfather     Social History Social History   Tobacco Use  . Smoking status: Never Smoker  . Smokeless tobacco: Never Used  Substance Use Topics  . Alcohol use: Not Currently  . Drug use: No    Review of Systems  Constitutional:   No fever or chills.  ENT:   No sore throat. No rhinorrhea. Cardiovascular:   No chest pain or syncope. Respiratory:   No dyspnea or cough. Gastrointestinal:   Negative for abdominal pain, vomiting and diarrhea.  Musculoskeletal: Right upper chest pain as above All other systems  reviewed and are negative except as documented above in ROS and HPI.  ____________________________________________   PHYSICAL EXAM:  VITAL SIGNS: ED Triage Vitals  Enc Vitals Group     BP 04/02/19 1707 (!) 172/97     Pulse Rate 04/02/19 1707 74     Resp 04/02/19 1707 18     Temp 04/02/19 1707 98.7 F (37.1 C)     Temp Source 04/02/19 1707 Oral     SpO2 04/02/19 1707 98 %     Weight 04/02/19 1710 183 lb (83 kg)     Height 04/02/19 1710 5\' 3"  (1.6 m)     Head Circumference --      Peak Flow --      Pain Score 04/02/19 1710  7     Pain Loc --      Pain Edu? --      Excl. in [place]? --     Vital signs reviewed, nursing assessments reviewed.   Constitutional:   Alert and oriented. Non-toxic appearance. Eyes:   Conjunctivae are normal. EOMI. PERRL. ENT      Head:   Normocephalic and atraumatic.      Nose:   Wearing a mask.      Mouth/Throat:   Wearing a mask.      Neck:   No meningismus. Full ROM.  Pronounced tenderness at the right lateral base of the neck and trapezius/supraclavicular area Hematological/Lymphatic/Immunilogical:   No cervical lymphadenopathy. Cardiovascular:   RRR. Symmetric bilateral radial and DP pulses.  No murmurs. Cap refill less than 2 seconds. Respiratory:   Normal respiratory effort without tachypnea/retractions. Breath sounds are clear and equal bilaterally. No wheezes/rales/rhonchi. Gastrointestinal:   Soft and nontender. Non distended. There is no CVA tenderness.  No rebound, rigidity, or guarding.  Musculoskeletal:   Normal range of motion in all extremities. No joint effusions.  No lower extremity tenderness.  No edema.  Right upper trunk exquisitely tender to touch with warmth and erythema.  No crepitus, no focal fluctuance Neurologic:   Normal speech and language.  Motor grossly intact. No acute focal neurologic deficits are appreciated.  Skin:    Skin is warm, dry and intact.   No petechiae, purpura, or bullae.  ____________________________________________    LABS (pertinent positives/negatives) (all labs ordered are listed, but only abnormal results are displayed) Labs Reviewed  LACTIC ACID, PLASMA - Abnormal; Notable for the following components:      Result Value   Lactic Acid, Venous 2.6 (*)    All other components within normal limits  LACTIC ACID, PLASMA - Abnormal; Notable for the following components:   Lactic Acid, Venous 2.7 (*)    All other components within normal limits  COMPREHENSIVE METABOLIC PANEL - Abnormal; Notable for the following components:    Glucose, Bld 249 (*)    BUN 34 (*)    All other components within normal limits  CBC WITH DIFFERENTIAL/PLATELET - Abnormal; Notable for the following components:   WBC 23.4 (*)    Neutro Abs 21.6 (*)    Lymphs Abs 0.4 (*)    Monocytes Absolute 1.1 (*)    Abs Immature Granulocytes 0.23 (*)    All other components within normal limits  URINALYSIS, COMPLETE (UACMP) WITH MICROSCOPIC - Abnormal; Notable for the following components:   Color, Urine STRAW (*)    APPearance CLEAR (*)    Bacteria, UA RARE (*)    All other components within normal limits  CULTURE, BLOOD (ROUTINE X 2)  CULTURE, BLOOD (ROUTINE  X 2)   ____________________________________________   EKG    ____________________________________________    RADIOLOGY  CT CHEST W CONTRAST  Result Date: 04/02/2019 CLINICAL DATA:  Right upper chest pain and swelling, elevated white blood cell count, history of head neck cancer EXAM: CT CHEST WITH CONTRAST TECHNIQUE: Multidetector CT imaging of the chest was performed during intravenous contrast administration. CONTRAST:  5mL OMNIPAQUE IOHEXOL 300 MG/ML  SOLN COMPARISON:  02/04/2019 FINDINGS: Cardiovascular: For heart is unremarkable without pericardial effusion. Mild atherosclerosis within the LAD distribution of the coronary vasculature. Aorta is normal in caliber with no evidence of dissection. While not optimized for the evaluation of the pulmonary vasculature, there is sufficient contrast enhancement and visualization to exclude proximal and segmental pulmonary emboli. Mediastinum/Nodes: There is a right-sided chest wall port via internal jugular approach, tip within the superior vena cava. No pathologic mediastinal, hilar, or axillary adenopathy. Lungs/Pleura: No airspace disease, effusion, or pneumothorax. 5 mm subpleural left lower lobe pulmonary nodule seen on recent PET scan again identified, reference image 71 of series 2. Central airways are patent. Upper Abdomen: No acute  abnormality. Musculoskeletal: There is subcutaneous edema within the superior posterior right shoulder, overlying the trapezius musculature. The muscle itself appears unremarkable with no intramuscular edema or abnormal enhancement. This is a nonspecific finding and could reflect cellulitis or posttraumatic change. Please correlate with direct visual inspection. The distal right subclavian and axillary veins are diminutive, may reflect chronic thrombosis. There are no acute or destructive bony lesions. Prior healed left rib fractures are noted. Reconstructed images demonstrate no additional findings. IMPRESSION: 1. Subcutaneous fat stranding posterior right shoulder, which may reflect cellulitis or posttraumatic change. Please correlate with direct visual inspection. 2. Stable 5 mm left lower lobe pulmonary nodule, nonspecific. Routine follow up recommended per protocol for patient's known head and neck cancer. 3. 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 4. Otherwise no acute intrathoracic process. Electronically Signed   By: Randa Ngo M.D.   On: 04/02/2019 19:58    ____________________________________________   PROCEDURES .Critical Care Performed by: Carrie Mew, MD Authorized by: Carrie Mew, MD   Critical care provider statement:    Critical care time (minutes):  32   Critical care time was exclusive of:  Separately billable procedures and treating other patients   Critical care was necessary to treat or prevent imminent or life-threatening deterioration of the following conditions:  Sepsis   Critical care was time spent personally by me on the following activities:  Development of treatment plan with patient or surrogate, discussions with consultants, evaluation of patient's response to treatment, examination of patient, obtaining history from patient or surrogate, ordering and performing treatments and interventions, ordering and review of  laboratory studies, ordering and review of radiographic studies, pulse oximetry, re-evaluation of patient's condition and review of old charts    ____________________________________________  DIFFERENTIAL DIAGNOSIS   Cellulitis, abscess, necrotizing lymphadenitis  CLINICAL IMPRESSION / ASSESSMENT AND PLAN / ED COURSE  Medications ordered in the ED: Medications  sodium chloride flush (NS) 0.9 % injection 3 mL (has no administration in time range)  ceFEPIme (MAXIPIME) 2 g in sodium chloride 0.9 % 100 mL IVPB (has no administration in time range)  metroNIDAZOLE (FLAGYL) IVPB 500 mg (has no administration in time range)  vancomycin (VANCOCIN) IVPB 1000 mg/200 mL premix (has no administration in time range)  sodium chloride 0.9 % bolus 1,000 mL (has no administration in time range)  ketorolac (TORADOL) 30 MG/ML injection 15 mg (15  mg Intravenous Given 04/02/19 1955)  sodium chloride 0.9 % bolus 1,000 mL (0 mLs Intravenous Stopped 04/02/19 2059)  iohexol (OMNIPAQUE) 300 MG/ML solution 75 mL (75 mLs Intravenous Contrast Given 04/02/19 1942)  HYDROmorphone (DILAUDID) injection 1 mg (1 mg Intravenous Given 04/02/19 2059)    Pertinent labs & imaging results that were available during my care of the patient were reviewed by me and considered in my medical decision making (see chart for details).  DEANETTE KALKOWSKI was evaluated in Emergency Department on 04/02/2019 for the symptoms described in the history of present illness. She was evaluated in the context of the global COVID-19 pandemic, which necessitated consideration that the patient might be at risk for infection with the SARS-CoV-2 virus that causes COVID-19. Institutional protocols and algorithms that pertain to the evaluation of patients at risk for COVID-19 are in a state of rapid change based on information released by regulatory bodies including the CDC and federal and state organizations. These policies and algorithms were followed during the  patient's care in the ED.   Patient presents with severe right upper chest wall pain in the area of trapezius, supraclavicular thorax.  Vital signs unremarkable.  Patient given initial IV Toradol 15 mg for pain relief, fluid bolus.  Initial lab panel unremarkable except for a lactate of 2.6 and white blood cell count of 23,000 which is predominantly neutrophils.  Blood cultures were obtained despite vital signs not meeting sepsis criteria.  Patient still in severe pain despite Toradol so she was given 1 mg IV Dilaudid.  CT scan consistent with cellulitis of the area without evidence of deeper space infection.  Second lactate after fluids is unchanged, increased from 2.6-2.7.  Given her cancer treatment, nonclearing lactate, marked leukocytosis, will plan to start IV antibiotics for cellulitis and admit for further management.  Evolving clinical picture is concerning for early sepsis even though the patient has no signs of shock, normal vital signs.      ____________________________________________   FINAL CLINICAL IMPRESSION(S) / ED DIAGNOSES    Final diagnoses:  Cellulitis of trunk, unspecified site of trunk  Carcinoma of base of tongue [facility])     ED Discharge Orders    None      Portions of this note were generated with dragon dictation software. Dictation errors may occur despite best attempts at proofreading.   Carrie Mew, MD 04/02/19 2109

## 2019-04-02 NOTE — ED Notes (Signed)
Redness, heat, tenderness, and swelling noted around right-chest port and neck. Pt c/o pain and fatigue. States she took a tylenol prior to arrival.

## 2019-04-02 NOTE — Telephone Encounter (Signed)
Dr. Mike Gip patient. Dr. Loletha Grayer can you please advise? I did not notice any significant port issues when I saw her. You were supposed to see her on 3/2 per your note. I dont see that scheduled. Jenny/Lauren- can you do a video visit with her today or take a look at her port on Monday?

## 2019-04-02 NOTE — Progress Notes (Signed)
PHARMACY -  BRIEF ANTIBIOTIC NOTE   Pharmacy has received consult(s) for Cefepime, Vancomycin from an ED provider.  The patient's profile has been reviewed for ht/wt/allergies/indication/available labs.    One time order(s) placed for Cefepime 2 gm IV X 1 , Vancomycin 2 gm IV X 1   Further antibiotics/pharmacy consults should be ordered by admitting physician if indicated.                       Thank you, Amyria Komar D 04/02/2019  9:19 PM

## 2019-04-02 NOTE — Telephone Encounter (Addendum)
Patient called repotring that she has had Swelling beside her port and now a  "knot" on her shoulder for the past week and it is sore and she was told that it is not from radiation therapy. She states she has tried heat and Tylenol for it and it is not helping. Please advise

## 2019-04-02 NOTE — ED Notes (Signed)
ED RN to transport pt.

## 2019-04-02 NOTE — ED Notes (Signed)
Pt in CT.

## 2019-04-02 NOTE — ED Notes (Signed)
Report given to inpatient nurse. Transportation requested.

## 2019-04-02 NOTE — ED Notes (Signed)
Pt requesting additional pain medicine- MD notified. Awaiting orders.

## 2019-04-02 NOTE — H&P (Signed)
History and Physical        Hospital Admission Note Date: 04/02/2019  Patient name: Connie Cameron Medical record number: [id number] Date of birth: [birth year] Age: 59 y.o. Gender: female  PCP: Patient, No Pcp Per    Patient coming from: Home   I have reviewed all records in the [facility].    Chief Complaint:  Pain Chest Wall and Neck   HPI: Connie Cameron is 58 y.o. female with carcinoma of base of tongue, currently undergoing treatment, who presents for redness, pain, and edema on the right upper chest, right upper back and neck. This started earlier today. Reports pain is excruciating. It is non-radiating. Had radiation therapy this morning. Reports she took Tylenol with minimal improvement in pain.    ED work-up/course:  Patient presents with severe right upper chest wall pain in the area of trapezius, supraclavicular thorax.  Vital signs unremarkable.  Patient given initial IV Toradol 15 mg for pain relief, fluid bolus.  Initial lab panel unremarkable except for a lactate of 2.6 and white blood cell count of 23,000 which is predominantly neutrophils.  Blood cultures were obtained despite vital signs not meeting sepsis criteria.  Patient still in severe pain despite Toradol so she was given 1 mg IV Dilaudid.  CT scan consistent with cellulitis of the area without evidence of deeper space infection.  Second lactate after fluids is unchanged, increased from 2.6-2.7.  Given her cancer treatment, nonclearing lactate, marked leukocytosis, will plan to start IV antibiotics for cellulitis and admit for further management.  Evolving clinical picture is concerning for early sepsis even though the patient has no signs of shock, normal vital signs.  Review of Systems: Positives marked in 'bold' Constitutional: Denies fever, chills, diaphoresis, poor appetite and fatigue.  HEENT: Denies photophobia, eye pain, redness, hearing  loss, ear pain, congestion, sore throat, rhinorrhea, sneezing, mouth sores, trouble swallowing, neck pain, neck stiffness and tinnitus.   Respiratory: Denies SOB, DOE, cough, chest tightness,  and wheezing.   Cardiovascular: Denies chest pain, palpitations and leg swelling.  Gastrointestinal: Denies nausea, vomiting, abdominal pain, diarrhea, constipation, blood in stool and abdominal distention.  Genitourinary: Denies dysuria, urgency, frequency, hematuria, flank pain and difficulty urinating.  Musculoskeletal: Denies myalgias, back pain, joint swelling, arthralgias and gait problem.  Skin: Denies pallor, rash and wound.  Neurological: Denies dizziness, seizures, syncope, weakness, light-headedness, numbness and headaches.  Hematological: Denies adenopathy. Easy bruising, personal or family bleeding history  Psychiatric/Behavioral: Denies suicidal ideation, mood changes, confusion, nervousness, sleep disturbance and agitation  Past Medical History: Past Medical History:  Diagnosis Date  . Medical history non-contributory   . 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 EXCISION MASS NECK Left 01/18/2019   Procedure: EXCISION MASS NECK/NODE;  Surgeon: Margaretha Sheffield, MD;  Location: [facility] ORS;  Service: ENT;  Laterality: Left;  . LARYNGOSCOPY Bilateral 02/22/2019   Procedure: MICROSCOPIC DIRECT LARYNGOSCOPY AND BIOPSY;  Surgeon: Margaretha Sheffield, MD;  Location: [facility] ORS;  Service: ENT;  Laterality: Bilateral;  . PORTA CATH INSERTION N/A 03/24/2019   Procedure: PORTA CATH INSERTION;  Surgeon: Katha Cabal, MD;  Location: [facility]  INVASIVE CV LAB;  Service: Cardiovascular;  Laterality: N/A;  . TONSILLECTOMY    . TUBAL LIGATION      Medications: Prior to Admission medications   Medication Sig Start Date End Date Taking? Authorizing Provider  dexamethasone (DECADRON) 4 MG tablet Take 2 tablets by mouth once a day on the day after chemotherapy and then take 2  tablets two times a day for 2 days. Take with food. 03/23/19   Lequita Asal, MD  lidocaine-prilocaine (EMLA) cream Apply to affected area once 03/23/19   Lequita Asal, MD  LORazepam (ATIVAN) 0.5 MG tablet Take 1 tablet (0.5 mg total) by mouth every 6 (six) hours as needed (Nausea or vomiting). Patient not taking: Reported on 03/30/2019 03/23/19   Lequita Asal, MD  ondansetron (ZOFRAN) 8 MG tablet Take 1 tablet (8 mg total) by mouth 2 (two) times daily as needed. Start on the third day after chemotherapy. Patient not taking: Reported on 03/30/2019 03/23/19   Lequita Asal, MD    Allergies:  No Known Allergies  Social History:  reports that she has never smoked. She has never used smokeless tobacco. She reports previous alcohol use. She reports that she does not use drugs.  Family History: Family History  Problem Relation Age of Onset  . Breast cancer Sister   . Brain cancer Paternal Grandfather     Physical Exam: Blood pressure (!) 156/107, pulse 81, temperature 98.7 F (37.1 C), temperature source Oral, resp. rate 19, height _0  (1.6 m), weight 83 kg, SpO2 98 %. General: Alert, awake, oriented x3, in no acute distress. Eyes: pink conjunctiva,anicteric sclera, pupils equal and reactive to light and accomodation, HEENT: normocephalic, atraumatic, oropharynx clear Neck: supple, no masses or lymphadenopathy, no goiter, no bruits, no JVD CVS: Regular rate and rhythm, without murmurs, rubs or gallops. No lower extremity edema Resp : Clear to auscultation bilaterally, no wheezing, rales or rhonchi. GI : Soft, nontender, nondistended, positive bowel sounds, no masses. No hepatomegaly. No hernia.  Musculoskeletal: No clubbing or cyanosis, positive pedal pulses. No contracture. ROM intact. Right upper trunk is TTP with erythema and increased warmth. Did not appreciate any crepitus or areas of fluctuance.  Neuro: Grossly intact, no focal neurological deficits, strength 5/5  upper and lower extremities bilaterally Psych: alert and oriented x 3, normal mood and affect Skin: no rashes or lesions, warm and dry   LABS on Admission: I have personally reviewed all the labs and imagings below    Basic Metabolic Panel: Recent Labs  Lab 03/30/19 0836 04/02/19 1716  NA 140 136  K 4.0 4.5  CL 106 103  CO2 24 22  GLUCOSE 102* 249*  BUN 19 34*  CREATININE 0.79 0.77  CALCIUM 9.6 9.4  MG 2.1  --    Liver Function Tests: Recent Labs  Lab 03/30/19 0836 04/02/19 1716  AST 23 20  ALT 16 18  ALKPHOS 81 78  BILITOT 0.5 0.8  PROT 8.0 7.8  ALBUMIN 4.4 4.2   No results for input(s): LIPASE, AMYLASE in the last 168 hours. No results for input(s): AMMONIA in the last 168 hours. CBC: Recent Labs  Lab 03/30/19 0836 03/30/19 0836 04/02/19 1716  WBC 8.2  --  23.4*  NEUTROABS 5.9   < > 21.6*  HGB 12.7  --  13.6  HCT 39.4  --  40.7  MCV 91.0   < > 88.5  PLT 263  --  295   < > = values in this  interval not displayed.   Cardiac Enzymes: No results for input(s): CKTOTAL, CKMB, CKMBINDEX, TROPONINI in the last 168 hours. BNP: Invalid input(s): POCBNP CBG: No results for input(s): GLUCAP in the last 168 hours.  Radiological Exams on Admission:  CT CHEST W CONTRAST  Result Date: 04/02/2019 CLINICAL DATA:  Right upper chest pain and swelling, elevated white blood cell count, history of head neck cancer EXAM: CT CHEST WITH CONTRAST TECHNIQUE: Multidetector CT imaging of the chest was performed during intravenous contrast administration. CONTRAST:  41m OMNIPAQUE IOHEXOL 300 MG/ML  SOLN COMPARISON:  02/04/2019 FINDINGS: Cardiovascular: For heart is unremarkable without pericardial effusion. Mild atherosclerosis within the LAD distribution of the coronary vasculature. Aorta is normal in caliber with no evidence of dissection. While not optimized for the evaluation of the pulmonary vasculature, there is sufficient contrast enhancement and visualization to exclude  proximal and segmental pulmonary emboli. Mediastinum/Nodes: There is a right-sided chest wall port via internal jugular approach, tip within the superior vena cava. No pathologic mediastinal, hilar, or axillary adenopathy. Lungs/Pleura: No airspace disease, effusion, or pneumothorax. 5 mm subpleural left lower lobe pulmonary nodule seen on recent PET scan again identified, reference image 71 of series 2. Central airways are patent. Upper Abdomen: No acute abnormality. Musculoskeletal: There is subcutaneous edema within the superior posterior right shoulder, overlying the trapezius musculature. The muscle itself appears unremarkable with no intramuscular edema or abnormal enhancement. This is a nonspecific finding and could reflect cellulitis or posttraumatic change. Please correlate with direct visual inspection. The distal right subclavian and axillary veins are diminutive, may reflect chronic thrombosis. There are no acute or destructive bony lesions. Prior healed left rib fractures are noted. Reconstructed images demonstrate no additional findings. IMPRESSION: 1. Subcutaneous fat stranding posterior right shoulder, which may reflect cellulitis or posttraumatic change. Please correlate with direct visual inspection. 2. Stable 5 mm left lower lobe pulmonary nodule, nonspecific. Routine follow up recommended per protocol for patient's known head and neck cancer. 3. 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 4. Otherwise no acute intrathoracic process. Electronically Signed   By: MRanda NgoM.D.   On: 04/02/2019 19:58     Assessment/Plan Active Problems:   Carcinoma of base of tongue (HCC)   Squamous cell carcinoma of head and neck (HCC)   Cellulitis  Cellulitis of Trunk Patient presenting with pain, erythema, and increased warmth to right trunk. Leukocytosis to 23. Lactic acid 2.6. Vital signs stable. Patient given IVF bolus with repeat lactic acid of 2.7. CT scan  obtained that showed subcutaneous fat stranding, concerning for cellulitis. No evidence of deeper space infection. Started on Vancomycin and Cefepime in ER to treat for presumed cellulitis. Would also consider radiation therapy burn. Given pain out of proportion to exam with other findings, compartment syndrome is possibility although this would be less likely given atypical location.  -observation on MedSurg, vital signs per unit  -continue Vancomycin and Cefepime  -blood cultures pending  -obtain CK  -obtain CRP and ESR  -repeat lactic acid  -NS at 125 cc/hr s/p IVF boluses  -repeat CBC in AM    DVT prophylaxis: Lovenox   CODE STATUS: FULL   Consults called: None   Family Communication: Admission, patients condition and plan of care including tests being ordered have been discussed with the patient. who indicates understanding and agree with the plan and Code Status  Admission status: Observation   The medical decision making on this patient was of high complexity and the patient  is at high risk for clinical deterioration, therefore this is a level 3 admission.  Severity of Illness:     Moderate  The appropriate patient status for this patient is OBSERVATION. Observation status is judged to be reasonable and necessary in order to provide the required intensity of service to ensure the patient's safety. The patient's presenting symptoms, physical exam findings, and initial radiographic and laboratory data in the context of their medical condition is felt to place them at decreased risk for further clinical deterioration. Furthermore, it is anticipated that the patient will be medically stable for discharge from the hospital within 2 midnights of admission. The following factors support the patient status of observation.   " The patient's presenting symptoms include pain/erythema/warmth to right trunk. " The physical exam findings include findings of above on exam. " The initial  radiographic and laboratory data are leukocytosis 24, lactic acid 2.6, CT with SQ fat stranding.     Time Spent on Admission: 47 minutes      Melina Schools D.O.  [facility] 04/02/2019, 9:41 PM

## 2019-04-02 NOTE — Progress Notes (Signed)
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 Mike Gip.  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  Size of the neck node was greater than 3 cm and therefore concurrent chemoradiation was recommended.  Plan is to give her 3 doses of cisplatin 100 mg per metered squared IV every 3 weeks  Interval history-currently patient feels well and denies any significant pain.  Reports no significant difficulty swallowing.  ECOG PS- 0 Pain scale- 0   Review of systems- Review of Systems  Constitutional: Negative for chills, fever, malaise/fatigue and weight loss.  HENT: Negative for congestion, ear discharge and nosebleeds.   Eyes: Negative for blurred vision.  Respiratory: Negative for cough, hemoptysis, sputum production, shortness of breath and wheezing.   Cardiovascular: Negative for chest pain, palpitations, orthopnea and claudication.  Gastrointestinal: Negative for abdominal pain, blood in stool, constipation, diarrhea, heartburn, melena, nausea and vomiting.  Genitourinary: Negative for dysuria, flank pain, frequency, hematuria and urgency.  Musculoskeletal: Negative for back pain, joint pain and myalgias.  Skin: Negative for rash.  Neurological: Negative for dizziness, tingling, focal weakness, seizures, weakness and headaches.  Endo/Heme/Allergies: Does not bruise/bleed easily.  Psychiatric/Behavioral:  Negative for depression and suicidal ideas. The patient does not have insomnia.        No Known Allergies   Past Medical History:  Diagnosis Date  . Medical history non-contributory      Past Surgical History:  Procedure Laterality Date  . EXCISION MASS NECK Left 01/18/2019   Procedure: EXCISION MASS NECK/NODE;  Surgeon: Margaretha Sheffield, MD;  Location: [facility] ORS;  Service: ENT;  Laterality: Left;  . LARYNGOSCOPY Bilateral 02/22/2019   Procedure: MICROSCOPIC DIRECT LARYNGOSCOPY AND BIOPSY;  Surgeon: Margaretha Sheffield, MD;  Location: [facility] ORS;  Service: ENT;  Laterality: Bilateral;  . PORTA CATH INSERTION N/A 03/24/2019   Procedure: PORTA CATH INSERTION;  Surgeon: Katha Cabal, MD;  Location: [facility] CV LAB;  Service: Cardiovascular;  Laterality: N/A;  . TONSILLECTOMY    . TUBAL LIGATION      Social History   Socioeconomic History  . Marital status: Single    Spouse name: Not on file  . Number of children: Not on file  . Years of education: Not on file  . Highest education level: Not on file  Occupational History  . Not on file  Tobacco Use  . Smoking status: Never Smoker  . Smokeless tobacco: Never Used  Substance and Sexual Activity  . Alcohol use: Not Currently  . Drug use: No  . Sexual activity: Not on file  Other Topics Concern  . Not on file  Social History Narrative  . Not on file   Social Determinants of Health   Financial Resource Strain:   . Difficulty of Paying Living Expenses: Not on file  Food Insecurity:   . Worried About Charity fundraiser in the Last Year: Not on file  . Ran Out of Food in the Last Year: Not on  file  Transportation Needs:   . Film/video editor (Medical): Not on file  . Lack of Transportation (Non-Medical): Not on file  Physical Activity:   . Days of Exercise per Week: Not on file  . Minutes of Exercise per Session: Not on file  Stress:   . Feeling of Stress : Not on file  Social Connections:   . Frequency of  Communication with Friends and Family: Not on file  . Frequency of Social Gatherings with Friends and Family: Not on file  . Attends Religious Services: Not on file  . Active Member of Clubs or Organizations: Not on file  . Attends Archivist Meetings: Not on file  . Marital Status: Not on file  Intimate Partner Violence:   . Fear of Current or Ex-Partner: Not on file  . Emotionally Abused: Not on file  . Physically Abused: Not on file  . Sexually Abused: Not on file    Family History  Problem Relation Age of Onset  . Breast cancer Sister   . Brain cancer Paternal Grandfather      Current Outpatient Medications:  .  dexamethasone (DECADRON) 4 MG tablet, Take 2 tablets by mouth once a day on the day after chemotherapy and then take 2 tablets two times a day for 2 days. Take with food., Disp: 30 tablet, Rfl: 1 .  lidocaine-prilocaine (EMLA) cream, Apply to affected area once, Disp: 30 g, Rfl: 3 .  LORazepam (ATIVAN) 0.5 MG tablet, Take 1 tablet (0.5 mg total) by mouth every 6 (six) hours as needed (Nausea or vomiting). (Patient not taking: Reported on 03/30/2019), Disp: 30 tablet, Rfl: 0 .  ondansetron (ZOFRAN) 8 MG tablet, Take 1 tablet (8 mg total) by mouth 2 (two) times daily as needed. Start on the third day after chemotherapy. (Patient not taking: Reported on 03/30/2019), Disp: 30 tablet, Rfl: 1  Physical exam:  Vitals:   03/30/19 0944  BP: (!) 147/99  Pulse: 94  Temp: (!) 96.7 F (35.9 C)  TempSrc: Tympanic  Weight: 182 lb 3.2 oz (82.6 kg)   Physical Exam HENT:     Head: Normocephalic and atraumatic.  Eyes:     Pupils: Pupils are equal, round, and reactive to light.  Cardiovascular:     Rate and Rhythm: Normal rate and regular rhythm.     Heart sounds: Normal heart sounds.  Pulmonary:     Effort: Pulmonary effort is normal.     Breath sounds: Normal breath sounds.  Abdominal:     General: Bowel sounds are normal.     Palpations: Abdomen is soft.  Skin:     General: Skin is warm and dry.  Neurological:     Mental Status: She is alert and oriented to person, place, and time.      CMP Latest Ref Rng & Units 03/30/2019  Glucose 70 - 99 mg/dL 102(H)  BUN 6 - 20 mg/dL 19  Creatinine 0.44 - 1.00 mg/dL 0.79  Sodium 135 - 145 mmol/L 140  Potassium 3.5 - 5.1 mmol/L 4.0  Chloride 98 - 111 mmol/L 106  CO2 22 - 32 mmol/L 24  Calcium 8.9 - 10.3 mg/dL 9.6  Total Protein 6.5 - 8.1 g/dL 8.0  Total Bilirubin 0.3 - 1.2 mg/dL 0.5  Alkaline Phos 38 - 126 U/L 81  AST 15 - 41 U/L 23  ALT 0 - 44 U/L 16   CBC Latest Ref Rng & Units 03/30/2019  WBC 4.0 - 10.5 K/uL 8.2  Hemoglobin  12.0 - 15.0 g/dL 12.7  Hematocrit 36.0 - 46.0 % 39.4  Platelets 150 - 400 K/uL 263    No images are attached to the encounter.  PERIPHERAL VASCULAR CATHETERIZATION  Result Date: 03/24/2019 See op note    Assessment and plan- Patient is a 59 y.o. 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  She will be receiving 1 L of IV fluids pre and post cisplatin.  Again discussed risks and benefits of chemotherapy including all but not limited to nausea, vomiting, low blood counts, risk of infections and hospitalization.  Risk of peripheral neuropathy associated with cisplatin along with acute kidney injury.  Treatment is being given with a curative intent.  Patient will see me in 3 weeks for cycle 2 of chemotherapy and in the interim continue to follow-up with Dr. Mike Gip for possible IV fluids   Visit Diagnosis 1. Encounter for antineoplastic chemotherapy   2. Carcinoma of base of tongue ([place])      Dr. Randa Evens, MD, MPH [facility] [id number] 04/02/2019 8:16 AM

## 2019-04-03 DIAGNOSIS — Z808 Family history of malignant neoplasm of other organs or systems: Secondary | ICD-10-CM | POA: Diagnosis not present

## 2019-04-03 DIAGNOSIS — Z803 Family history of malignant neoplasm of breast: Secondary | ICD-10-CM | POA: Diagnosis not present

## 2019-04-03 DIAGNOSIS — L03312 Cellulitis of back [any part except buttock]: Secondary | ICD-10-CM

## 2019-04-03 DIAGNOSIS — L03313 Cellulitis of chest wall: Secondary | ICD-10-CM | POA: Diagnosis present

## 2019-04-03 DIAGNOSIS — L03221 Cellulitis of neck: Secondary | ICD-10-CM | POA: Diagnosis present

## 2019-04-03 DIAGNOSIS — Z9221 Personal history of antineoplastic chemotherapy: Secondary | ICD-10-CM | POA: Diagnosis not present

## 2019-04-03 DIAGNOSIS — E876 Hypokalemia: Secondary | ICD-10-CM | POA: Diagnosis present

## 2019-04-03 DIAGNOSIS — D72828 Other elevated white blood cell count: Secondary | ICD-10-CM

## 2019-04-03 DIAGNOSIS — R739 Hyperglycemia, unspecified: Secondary | ICD-10-CM | POA: Diagnosis present

## 2019-04-03 DIAGNOSIS — L0390 Cellulitis, unspecified: Secondary | ICD-10-CM | POA: Diagnosis present

## 2019-04-03 DIAGNOSIS — T380X5A Adverse effect of glucocorticoids and synthetic analogues, initial encounter: Secondary | ICD-10-CM | POA: Diagnosis present

## 2019-04-03 DIAGNOSIS — E872 Acidosis: Secondary | ICD-10-CM | POA: Diagnosis present

## 2019-04-03 DIAGNOSIS — Z923 Personal history of irradiation: Secondary | ICD-10-CM | POA: Diagnosis not present

## 2019-04-03 DIAGNOSIS — C01 Malignant neoplasm of base of tongue: Secondary | ICD-10-CM | POA: Diagnosis present

## 2019-04-03 LAB — LACTIC ACID, PLASMA
Lactic Acid, Venous: 1.9 mmol/L (ref 0.5–1.9)
Lactic Acid, Venous: 1.9 mmol/L (ref 0.5–1.9)
Lactic Acid, Venous: 2.2 mmol/L (ref 0.5–1.9)
Lactic Acid, Venous: 3 mmol/L (ref 0.5–1.9)

## 2019-04-03 LAB — CBC
HCT: 38 % (ref 36.0–46.0)
Hemoglobin: 12.2 g/dL (ref 12.0–15.0)
MCH: 29.6 pg (ref 26.0–34.0)
MCHC: 32.1 g/dL (ref 30.0–36.0)
MCV: 92.2 fL (ref 80.0–100.0)
Platelets: 249 10*3/uL (ref 150–400)
RBC: 4.12 MIL/uL (ref 3.87–5.11)
RDW: 13 % (ref 11.5–15.5)
WBC: 21.2 10*3/uL — ABNORMAL HIGH (ref 4.0–10.5)
nRBC: 0 % (ref 0.0–0.2)

## 2019-04-03 LAB — BASIC METABOLIC PANEL
Anion gap: 10 (ref 5–15)
BUN: 29 mg/dL — ABNORMAL HIGH (ref 6–20)
CO2: 21 mmol/L — ABNORMAL LOW (ref 22–32)
Calcium: 8.6 mg/dL — ABNORMAL LOW (ref 8.9–10.3)
Chloride: 105 mmol/L (ref 98–111)
Creatinine, Ser: 0.74 mg/dL (ref 0.44–1.00)
GFR calc Af Amer: >60 mL/min (ref >60)
GFR calc non Af Amer: >60 mL/min (ref >60)
Glucose, Bld: 224 mg/dL — ABNORMAL HIGH (ref 70–99)
Potassium: 4.3 mmol/L (ref 3.5–5.1)
Sodium: 136 mmol/L (ref 135–145)

## 2019-04-03 LAB — SEDIMENTATION RATE: Sed Rate: 13 mm/hr (ref 0–30)

## 2019-04-03 LAB — C-REACTIVE PROTEIN: CRP: 1.1 mg/dL — ABNORMAL HIGH (ref <1.0)

## 2019-04-03 LAB — PROCALCITONIN: Procalcitonin: <0.1 ng/mL

## 2019-04-03 LAB — HIV ANTIBODY (ROUTINE TESTING W REFLEX): HIV Screen 4th Generation wRfx: NONREACTIVE

## 2019-04-03 LAB — CK: Total CK: 30 U/L — ABNORMAL LOW (ref 38–234)

## 2019-04-03 MED ORDER — LACTATED RINGERS IV SOLN: INTRAVENOUS | Status: DC

## 2019-04-03 MED ORDER — BISACODYL 5 MG PO TBEC
10.0000 mg | DELAYED_RELEASE_TABLET | Freq: Every day | ORAL | Status: DC | PRN
  Administered 2019-04-03 – 2019-04-04 (×2): 10 mg via ORAL
  Filled 2019-04-03 (×2): qty 2

## 2019-04-03 MED ORDER — OXYCODONE-ACETAMINOPHEN 7.5-325 MG PO TABS
1.0000 | ORAL_TABLET | Freq: Four times a day (QID) | ORAL | Status: DC | PRN
  Administered 2019-04-03 – 2019-04-06 (×7): 1 via ORAL
  Filled 2019-04-03 (×7): qty 1

## 2019-04-03 MED ORDER — SODIUM CHLORIDE 0.9 % IV SOLN: INTRAVENOUS | Status: DC

## 2019-04-03 MED ORDER — ONDANSETRON 4 MG PO TBDP
4.0000 mg | ORAL_TABLET | Freq: Three times a day (TID) | ORAL | Status: DC | PRN
  Administered 2019-04-04: 4 mg via ORAL
  Filled 2019-04-03: qty 1

## 2019-04-03 MED ORDER — VANCOMYCIN HCL 1250 MG/250ML IV SOLN
1250.0000 mg | INTRAVENOUS | Status: DC
  Administered 2019-04-03 – 2019-04-06 (×3): 1250 mg via INTRAVENOUS
  Filled 2019-04-03 (×5): qty 250

## 2019-04-03 MED ORDER — ONDANSETRON HCL 4 MG/2ML IJ SOLN
4.0000 mg | Freq: Three times a day (TID) | INTRAMUSCULAR | Status: DC | PRN
  Administered 2019-04-06: 4 mg via INTRAVENOUS
  Filled 2019-04-03: qty 2

## 2019-04-03 NOTE — Progress Notes (Signed)
Pharmacy Antibiotic Note  Connie Cameron is a 59 y.o. female admitted on 04/02/2019 with cellulitis.  Pharmacy has been consulted for vanc/cefepime dosing. Patient received vanc 2g IV load, cefepime 2g IV x 1 in ED.  Plan: Will continue cefepime 2g IV q8h per CrCl > 60 ml/min and will continue to monitor   Vancomycin 1250 mg IV Q 24 hrs. Goal AUC 400-550. Expected AUC: 528.3 SCr used: 0.8 (actual 0.77) Cssmin: 11.2  Height: 5\' 3"  (160 cm) Weight: 183 lb (83 kg) IBW/kg (Calculated) : 52.4  Temp (24hrs), Avg:98.3 F (36.8 C), Min:98 F (36.7 C), Max:98.7 F (37.1 C)  Recent Labs  Lab 03/30/19 0836 04/02/19 1716 04/02/19 1839 04/03/19 0019  WBC 8.2 23.4*  --   --   CREATININE 0.79 0.77  --   --   LATICACIDVEN  --  2.6* 2.7* 2.2*    Estimated Creatinine Clearance: 78.2 mL/min (by C-G formula based on SCr of 0.77 mg/dL).    No Known Allergies  Thank you for allowing pharmacy to be a part of this patient's care.  Tobie Lords, PharmD, BCPS Clinical Pharmacist 04/03/2019 2:00 AM

## 2019-04-03 NOTE — Progress Notes (Signed)
PROGRESS NOTE    Connie Cameron  [id number] DOB: 1960/06/28 DOA: 04/02/2019 PCP: Patient, No Pcp Per     Assessment & Plan:   Active Problems:   Carcinoma of base of tongue (HCC)   Squamous cell carcinoma of head and neck (HCC)   Cellulitis   Cellulitis of the trunk: continue on IV vanco & cefepime. Possible consideration for radiation therapy burn. Will consult wound care. Blood cxs NGTD. Elevated CRP. Pro-cal ordered  Lactic acidosis: continue on IVFs. Repeat lactic acid ordered  Leukocytosis: infection vs reactive. Continue on IV abxs. Will continue to monitor   Carcinoma of base of the tongue: s/p radiation on 04/02/19. Management per onco as outpatient   Hyperglycemia: no hx of DM. Possibly secondary to steroid use. Will continue to monitor   DVT prophylaxis: lovenox Code Status: full  Family Communication: discussed w/ pt's family who was at bedside  Disposition Plan: likely will need several days more as the pt continues to improve clinically    Consultants:      Procedures:    Antimicrobials: vanco & cefepime   Subjective: Pt c/o trunk pain   Objective: Vitals:   04/02/19 2230 04/02/19 2257 04/02/19 2300 04/03/19 0401  BP: (!) 145/91 (!) 142/97  128/84  Pulse: 86 80  86  Resp:  18  16  Temp:  98.1 F (36.7 C)  98.2 F (36.8 C)  TempSrc:    Oral  SpO2: 96% 100%  97%  Weight:   83 kg   Height:        Intake/Output Summary (Last 24 hours) at 04/03/2019 0804 Last data filed at 04/03/2019 0400 Gross per 24 hour  Intake 2856.14 ml  Output 900 ml  Net 1956.14 ml   Filed Weights   04/02/19 1710 04/02/19 2300  Weight: 83 kg 83 kg    Examination:  General exam: Appears calm and comfortable  Respiratory system: Clear to auscultation. Respiratory effort normal. No wheezes, rales Cardiovascular system: S1 & S2 +. No rubs, gallops or clicks.  Gastrointestinal system: Abdomen is nondistended, soft and nontender. Normal bowel sounds  heard. Central nervous system: Alert and oriented. Moves all 4 extremities Skin: significant erythema of neck, right shoulder & upper back, tenderness to palpation Psychiatry: Judgement and insight appear normal. Flat mood and affect    Data Reviewed: I have personally reviewed following labs and imaging studies  CBC: Recent Labs  Lab 03/30/19 0836 04/02/19 1716  WBC 8.2 23.4*  NEUTROABS 5.9 21.6*  HGB 12.7 13.6  HCT 39.4 40.7  MCV 91.0 88.5  PLT 263 [id number]   Basic Metabolic Panel: Recent Labs  Lab 03/30/19 0836 04/02/19 1716  NA 140 136  K 4.0 4.5  CL 106 103  CO2 24 22  GLUCOSE 102* 249*  BUN 19 34*  CREATININE 0.79 0.77  CALCIUM 9.6 9.4  MG 2.1  --    GFR: Estimated Creatinine Clearance: 78.2 mL/min (by C-G formula based on SCr of 0.77 mg/dL). Liver Function Tests: Recent Labs  Lab 03/30/19 0836 04/02/19 1716  AST 23 20  ALT 16 18  ALKPHOS 81 78  BILITOT 0.5 0.8  PROT 8.0 7.8  ALBUMIN 4.4 4.2   No results for input(s): LIPASE, AMYLASE in the last 168 hours. No results for input(s): AMMONIA in the last 168 hours. Coagulation Profile: No results for input(s): INR, PROTIME in the last 168 hours. Cardiac Enzymes: Recent Labs  Lab 04/03/19 0019  CKTOTAL 30*   BNP (last 3 results)  No results for input(s): PROBNP in the last 8760 hours. HbA1C: No results for input(s): HGBA1C in the last 72 hours. CBG: No results for input(s): GLUCAP in the last 168 hours. Lipid Profile: No results for input(s): CHOL, HDL, LDLCALC, TRIG, CHOLHDL, LDLDIRECT in the last 72 hours. Thyroid Function Tests: No results for input(s): TSH, T4TOTAL, FREET4, T3FREE, THYROIDAB in the last 72 hours. Anemia Panel: No results for input(s): VITAMINB12, FOLATE, FERRITIN, TIBC, IRON, RETICCTPCT in the last 72 hours. Sepsis Labs: Recent Labs  Lab 04/02/19 1716 04/02/19 1839 04/03/19 0019 04/03/19 0331  LATICACIDVEN 2.6* 2.7* 2.2* 3.0*    Recent Results (from the past 240  hour(s))  Culture, blood (routine x 2)     Status: None (Preliminary result)   Collection Time: 04/02/19  6:39 PM   Specimen: BLOOD  Result Value Ref Range Status   Specimen Description BLOOD RIGHT ANTECUBITAL  Final   Special Requests   Final    BOTTLES DRAWN AEROBIC AND ANAEROBIC Blood Culture adequate volume   Culture   Final    NO GROWTH < 12 HOURS Performed at [facility], [address], [place], [place] [address]    Report Status PENDING  Incomplete  Culture, blood (routine x 2)     Status: None (Preliminary result)   Collection Time: 04/02/19  6:39 PM   Specimen: BLOOD  Result Value Ref Range Status   Specimen Description BLOOD BLOOD LEFT FOREARM  Final   Special Requests   Final    BOTTLES DRAWN AEROBIC AND ANAEROBIC Blood Culture adequate volume   Culture   Final    NO GROWTH < 12 HOURS Performed at [facility], [address] [place], [place] [address]    Report Status PENDING  Incomplete         Radiology Studies: CT CHEST W CONTRAST  Result Date: 04/02/2019 CLINICAL DATA:  Right upper chest pain and swelling, elevated white blood cell count, history of head neck cancer EXAM: CT CHEST WITH CONTRAST TECHNIQUE: Multidetector CT imaging of the chest was performed during intravenous contrast administration. CONTRAST:  46mL OMNIPAQUE IOHEXOL 300 MG/ML  SOLN COMPARISON:  02/04/2019 FINDINGS: Cardiovascular: For heart is unremarkable without pericardial effusion. Mild atherosclerosis within the LAD distribution of the coronary vasculature. Aorta is normal in caliber with no evidence of dissection. While not optimized for the evaluation of the pulmonary vasculature, there is sufficient contrast enhancement and visualization to exclude proximal and segmental pulmonary emboli. Mediastinum/Nodes: There is a right-sided chest wall port via internal jugular approach, tip within the superior vena cava. No pathologic mediastinal, hilar, or axillary adenopathy.  Lungs/Pleura: No airspace disease, effusion, or pneumothorax. 5 mm subpleural left lower lobe pulmonary nodule seen on recent PET scan again identified, reference image 71 of series 2. Central airways are patent. Upper Abdomen: No acute abnormality. Musculoskeletal: There is subcutaneous edema within the superior posterior right shoulder, overlying the trapezius musculature. The muscle itself appears unremarkable with no intramuscular edema or abnormal enhancement. This is a nonspecific finding and could reflect cellulitis or posttraumatic change. Please correlate with direct visual inspection. The distal right subclavian and axillary veins are diminutive, may reflect chronic thrombosis. There are no acute or destructive bony lesions. Prior healed left rib fractures are noted. Reconstructed images demonstrate no additional findings. IMPRESSION: 1. Subcutaneous fat stranding posterior right shoulder, which may reflect cellulitis or posttraumatic change. Please correlate with direct visual inspection. 2. Stable 5 mm left lower lobe pulmonary nodule, nonspecific. Routine follow up recommended  per protocol for patient's known head and neck cancer. 3. 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 4. Otherwise no acute intrathoracic process. Electronically Signed   By: Randa Ngo M.D.   On: 04/02/2019 19:58        Scheduled Meds: . enoxaparin (LOVENOX) injection  40 mg Subcutaneous Q24H   Continuous Infusions: . ceFEPime (MAXIPIME) IV 2 g (04/03/19 0525)  . lactated ringers 150 mL/hr at 04/03/19 0522  . vancomycin       LOS: 0 days    Time spent: 33 mins    Wyvonnia Dusky, MD [facility] Pager [phone]  If 7PM-7AM, please contact night-coverage [email or web address] 04/03/2019, 8:04 AM

## 2019-04-04 LAB — CBC
HCT: 32.5 % — ABNORMAL LOW (ref 36.0–46.0)
Hemoglobin: 10.8 g/dL — ABNORMAL LOW (ref 12.0–15.0)
MCH: 29.7 pg (ref 26.0–34.0)
MCHC: 33.2 g/dL (ref 30.0–36.0)
MCV: 89.3 fL (ref 80.0–100.0)
Platelets: 170 10*3/uL (ref 150–400)
RBC: 3.64 MIL/uL — ABNORMAL LOW (ref 3.87–5.11)
RDW: 12.7 % (ref 11.5–15.5)
WBC: 13.3 10*3/uL — ABNORMAL HIGH (ref 4.0–10.5)
nRBC: 0 % (ref 0.0–0.2)

## 2019-04-04 LAB — BASIC METABOLIC PANEL
Anion gap: 6 (ref 5–15)
BUN: 18 mg/dL (ref 6–20)
CO2: 23 mmol/L (ref 22–32)
Calcium: 8.4 mg/dL — ABNORMAL LOW (ref 8.9–10.3)
Chloride: 103 mmol/L (ref 98–111)
Creatinine, Ser: 0.76 mg/dL (ref 0.44–1.00)
GFR calc Af Amer: >60 mL/min (ref >60)
GFR calc non Af Amer: >60 mL/min (ref >60)
Glucose, Bld: 121 mg/dL — ABNORMAL HIGH (ref 70–99)
Potassium: 3.6 mmol/L (ref 3.5–5.1)
Sodium: 132 mmol/L — ABNORMAL LOW (ref 135–145)

## 2019-04-04 NOTE — Progress Notes (Signed)
PROGRESS NOTE    Connie Cameron  [id number] DOB: 1960/07/30 DOA: 04/02/2019 PCP: Patient, No Pcp Per     Assessment & Plan:   Active Problems:   Carcinoma of base of tongue (HCC)   Squamous cell carcinoma of head and neck (HCC)   Cellulitis   Cellulitis of the trunk: continue on IV vanco & cefepime. Improving. Possible consideration for radiation therapy burn. Wound care consulted. Blood cxs NGTD. Elevated CRP. Pro-cal 0.10. Spiking fevers now  Lactic acidosis: resolved  Leukocytosis: infection vs reactive. Continue on IV abxs. Will continue to monitor   Carcinoma of base of the tongue: s/p radiation on 04/02/19. Management per onco as outpatient   Hyperglycemia: no hx of DM. Possibly secondary to steroid use. Will continue to monitor   DVT prophylaxis: lovenox Code Status: full  Family Communication: Disposition Plan: likely will need several days more as the pt continues to improve clinically    Consultants:      Procedures:    Antimicrobials: vanco & cefepime   Subjective: Pt c/o trunk pain still   Objective: Vitals:   04/03/19 1209 04/03/19 1946 04/04/19 0427 04/04/19 0600  BP: 121/76 125/79 119/83   Pulse: 95 97 100   Resp: 20 20 18    Temp: 98.4 F (36.9 C) 98.4 F (36.9 C) (!) 101.1 F (38.4 C) 98.7 F (37.1 C)  TempSrc: Oral Oral Oral Oral  SpO2: 96% 98% 96%   Weight:      Height:        Intake/Output Summary (Last 24 hours) at 04/04/2019 0819 Last data filed at 04/04/2019 0432 Gross per 24 hour  Intake 2187.95 ml  Output 2300 ml  Net -112.05 ml   Filed Weights   04/02/19 1710 04/02/19 2300  Weight: 83 kg 83 kg    Examination:  General exam: Appears calm and comfortable  Respiratory system: Clear to auscultation.  No rhonchi Cardiovascular system: S1 & S2 +. No rubs, gallops or clicks.  Gastrointestinal system: Abdomen is nondistended, soft and nontender. Normal bowel sounds heard. Central nervous system: Alert and oriented.  Moves all 4 extremities Skin: decreased erythema of neck, right shoulder & upper back, tenderness to palpation Psychiatry: Judgement and insight appear normal. Flat mood and affect    Data Reviewed: I have personally reviewed following labs and imaging studies  CBC: Recent Labs  Lab 03/30/19 0836 04/02/19 1716 04/03/19 0019 04/04/19 0456  WBC 8.2 23.4* 21.2* 13.3*  NEUTROABS 5.9 21.6*  --   --   HGB 12.7 13.6 12.2 10.8*  HCT 39.4 40.7 38.0 32.5*  MCV 91.0 88.5 92.2 89.3  PLT 263 295 249 [id number]   Basic Metabolic Panel: Recent Labs  Lab 03/30/19 0836 04/02/19 1716 04/03/19 0019 04/04/19 0456  NA 140 136 136 132*  K 4.0 4.5 4.3 3.6  CL 106 103 105 103  CO2 24 22 21* 23  GLUCOSE 102* 249* 224* 121*  BUN 19 34* 29* 18  CREATININE 0.79 0.77 0.74 0.76  CALCIUM 9.6 9.4 8.6* 8.4*  MG 2.1  --   --   --    GFR: Estimated Creatinine Clearance: 78.2 mL/min (by C-G formula based on SCr of 0.76 mg/dL). Liver Function Tests: Recent Labs  Lab 03/30/19 0836 04/02/19 1716  AST 23 20  ALT 16 18  ALKPHOS 81 78  BILITOT 0.5 0.8  PROT 8.0 7.8  ALBUMIN 4.4 4.2   No results for input(s): LIPASE, AMYLASE in the last 168 hours. No results for input(s):  AMMONIA in the last 168 hours. Coagulation Profile: No results for input(s): INR, PROTIME in the last 168 hours. Cardiac Enzymes: Recent Labs  Lab 04/03/19 0019  CKTOTAL 30*   BNP (last 3 results) No results for input(s): PROBNP in the last 8760 hours. HbA1C: No results for input(s): HGBA1C in the last 72 hours. CBG: No results for input(s): GLUCAP in the last 168 hours. Lipid Profile: No results for input(s): CHOL, HDL, LDLCALC, TRIG, CHOLHDL, LDLDIRECT in the last 72 hours. Thyroid Function Tests: No results for input(s): TSH, T4TOTAL, FREET4, T3FREE, THYROIDAB in the last 72 hours. Anemia Panel: No results for input(s): VITAMINB12, FOLATE, FERRITIN, TIBC, IRON, RETICCTPCT in the last 72 hours. Sepsis Labs: Recent Labs    Lab 04/03/19 0019 04/03/19 0331 04/03/19 1502 04/03/19 1758  PROCALCITON  --   --  <0.10  --   LATICACIDVEN 2.2* 3.0* 1.9 1.9    Recent Results (from the past 240 hour(s))  Culture, blood (routine x 2)     Status: None (Preliminary result)   Collection Time: 04/02/19  6:39 PM   Specimen: BLOOD  Result Value Ref Range Status   Specimen Description BLOOD RIGHT ANTECUBITAL  Final   Special Requests   Final    BOTTLES DRAWN AEROBIC AND ANAEROBIC Blood Culture adequate volume   Culture   Final    NO GROWTH 2 DAYS Performed at [facility], [address] [place], [place] [address]    Report Status PENDING  Incomplete  Culture, blood (routine x 2)     Status: None (Preliminary result)   Collection Time: 04/02/19  6:39 PM   Specimen: BLOOD  Result Value Ref Range Status   Specimen Description BLOOD BLOOD LEFT FOREARM  Final   Special Requests   Final    BOTTLES DRAWN AEROBIC AND ANAEROBIC Blood Culture adequate volume   Culture   Final    NO GROWTH 2 DAYS Performed at [facility], [address], [place], [place] [address]    Report Status PENDING  Incomplete         Radiology Studies: CT CHEST W CONTRAST  Result Date: 04/02/2019 CLINICAL DATA:  Right upper chest pain and swelling, elevated white blood cell count, history of head neck cancer EXAM: CT CHEST WITH CONTRAST TECHNIQUE: Multidetector CT imaging of the chest was performed during intravenous contrast administration. CONTRAST:  41mL OMNIPAQUE IOHEXOL 300 MG/ML  SOLN COMPARISON:  02/04/2019 FINDINGS: Cardiovascular: For heart is unremarkable without pericardial effusion. Mild atherosclerosis within the LAD distribution of the coronary vasculature. Aorta is normal in caliber with no evidence of dissection. While not optimized for the evaluation of the pulmonary vasculature, there is sufficient contrast enhancement and visualization to exclude proximal and segmental pulmonary emboli. Mediastinum/Nodes:  There is a right-sided chest wall port via internal jugular approach, tip within the superior vena cava. No pathologic mediastinal, hilar, or axillary adenopathy. Lungs/Pleura: No airspace disease, effusion, or pneumothorax. 5 mm subpleural left lower lobe pulmonary nodule seen on recent PET scan again identified, reference image 71 of series 2. Central airways are patent. Upper Abdomen: No acute abnormality. Musculoskeletal: There is subcutaneous edema within the superior posterior right shoulder, overlying the trapezius musculature. The muscle itself appears unremarkable with no intramuscular edema or abnormal enhancement. This is a nonspecific finding and could reflect cellulitis or posttraumatic change. Please correlate with direct visual inspection. The distal right subclavian and axillary veins are diminutive, may reflect chronic thrombosis. There are no acute or destructive bony lesions. Prior  healed left rib fractures are noted. Reconstructed images demonstrate no additional findings. IMPRESSION: 1. Subcutaneous fat stranding posterior right shoulder, which may reflect cellulitis or posttraumatic change. Please correlate with direct visual inspection. 2. Stable 5 mm left lower lobe pulmonary nodule, nonspecific. Routine follow up recommended per protocol for patient's known head and neck cancer. 3. 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 4. Otherwise no acute intrathoracic process. Electronically Signed   By: Randa Ngo M.D.   On: 04/02/2019 19:58        Scheduled Meds: . enoxaparin (LOVENOX) injection  40 mg Subcutaneous Q24H   Continuous Infusions: . sodium chloride 100 mL/hr at 04/04/19 0432  . ceFEPime (MAXIPIME) IV 2 g (04/04/19 [id number])  . vancomycin 1,250 mg (04/03/19 2248)     LOS: 1 day    Time spent: 30 mins    Wyvonnia Dusky, MD [facility] Pager [phone]  If 7PM-7AM, please contact  night-coverage [email or web address] 04/04/2019, 8:19 AM

## 2019-04-05 ENCOUNTER — Ambulatory Visit: Payer: Medicaid Other

## 2019-04-05 DIAGNOSIS — C01 Malignant neoplasm of base of tongue: Secondary | ICD-10-CM

## 2019-04-05 DIAGNOSIS — L03319 Cellulitis of trunk, unspecified: Secondary | ICD-10-CM

## 2019-04-05 LAB — CBC
HCT: 32.4 % — ABNORMAL LOW (ref 36.0–46.0)
Hemoglobin: 10.7 g/dL — ABNORMAL LOW (ref 12.0–15.0)
MCH: 29.5 pg (ref 26.0–34.0)
MCHC: 33 g/dL (ref 30.0–36.0)
MCV: 89.3 fL (ref 80.0–100.0)
Platelets: 165 10*3/uL (ref 150–400)
RBC: 3.63 MIL/uL — ABNORMAL LOW (ref 3.87–5.11)
RDW: 12.7 % (ref 11.5–15.5)
WBC: 10.6 10*3/uL — ABNORMAL HIGH (ref 4.0–10.5)
nRBC: 0 % (ref 0.0–0.2)

## 2019-04-05 LAB — BASIC METABOLIC PANEL
Anion gap: 4 — ABNORMAL LOW (ref 5–15)
BUN: 13 mg/dL (ref 6–20)
CO2: 28 mmol/L (ref 22–32)
Calcium: 8.3 mg/dL — ABNORMAL LOW (ref 8.9–10.3)
Chloride: 106 mmol/L (ref 98–111)
Creatinine, Ser: 0.68 mg/dL (ref 0.44–1.00)
GFR calc Af Amer: >60 mL/min (ref >60)
GFR calc non Af Amer: >60 mL/min (ref >60)
Glucose, Bld: 122 mg/dL — ABNORMAL HIGH (ref 70–99)
Potassium: 3.5 mmol/L (ref 3.5–5.1)
Sodium: 138 mmol/L (ref 135–145)

## 2019-04-05 NOTE — Telephone Encounter (Signed)
Late Entry- spoke to patient on Friday, 2/26, and recommended she be evaluated in ER given symptoms. Appears that she was admitted to hospital and receiving IV antibiotics.

## 2019-04-05 NOTE — Progress Notes (Signed)
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  Has what appears to be cellulitis on her right posterior back this is far outside our radiation treatment fields.  Has nothing to do with radiation skin reaction.  Being treated as a cellulitis which I believe is appropriate.  Whether possibly related to chemotherapy will be addressed by Dr. Mike Gip.  We will hold treatments till Wednesday and resume treatments as planned.

## 2019-04-05 NOTE — Progress Notes (Signed)
PROGRESS NOTE    Connie Cameron  [id number] DOB: Feb 04, 1961 DOA: 04/02/2019 PCP: Patient, No Pcp Per     Assessment & Plan:   Active Problems:   Carcinoma of base of tongue (HCC)   Squamous cell carcinoma of head and neck (HCC)   Cellulitis   Cellulitis of the trunk: continue on IV vanco & cefepime. Can likely switch to po abxs tomorrow & d/c home if cellulitis continues to improve. Radiation therapy burn r/o. Blood cxs NGTD. Elevated CRP. Pro-cal 0.10. Afebrile for 24 hours at least   Lactic acidosis: resolved  Leukocytosis: infection vs reactive. Continue on IV abxs. Will continue to monitor   Carcinoma of base of the tongue: s/p radiation on 04/02/19. Management per onco as outpatient   Hyperglycemia: no hx of DM. Possibly secondary to steroid use. Will continue to monitor   DVT prophylaxis: lovenox Code Status: full  Family Communication: Disposition Plan: likely will need several days more as the pt continues to improve clinically    Consultants:      Procedures:    Antimicrobials: vanco & cefepime   Subjective: Pt c/o trunk pain still but improved from day prior  Objective: Vitals:   04/04/19 1030 04/04/19 1148 04/04/19 2045 04/05/19 0459  BP: 127/84 130/80 140/84 115/72  Pulse: 86 87 84 90  Resp: 18  20 20   Temp: 98.4 F (36.9 C) 98.2 F (36.8 C) 98.8 F (37.1 C) 98.9 F (37.2 C)  TempSrc: Oral  Oral Oral  SpO2: 96% 96% 98% 95%  Weight:      Height:        Intake/Output Summary (Last 24 hours) at 04/05/2019 0807 Last data filed at 04/05/2019 [id number] Gross per 24 hour  Intake 480 ml  Output 1900 ml  Net -1420 ml   Filed Weights   04/02/19 1710 04/02/19 2300  Weight: 83 kg 83 kg    Examination:  General exam: Appears calm and comfortable  Respiratory system: Clear to auscultation.  No rales or wheezes Cardiovascular system: S1 & S2 +. No rubs, gallops or clicks.  Gastrointestinal system: Abdomen is nondistended, soft and nontender.  Normal bowel sounds heard. Central nervous system: Alert and oriented. Moves all 4 extremities Skin: decreased erythema of neck, right shoulder & upper back, tenderness to palpation Psychiatry: Judgement and insight appear normal. normal mood and affect    Data Reviewed: I have personally reviewed following labs and imaging studies  CBC: Recent Labs  Lab 03/30/19 0836 04/02/19 1716 04/03/19 0019 04/04/19 0456 04/05/19 0352  WBC 8.2 23.4* 21.2* 13.3* 10.6*  NEUTROABS 5.9 21.6*  --   --   --   HGB 12.7 13.6 12.2 10.8* 10.7*  HCT 39.4 40.7 38.0 32.5* 32.4*  MCV 91.0 88.5 92.2 89.3 89.3  PLT 263 295 249 170 [id number]   Basic Metabolic Panel: Recent Labs  Lab 03/30/19 0836 04/02/19 1716 04/03/19 0019 04/04/19 0456 04/05/19 0352  NA 140 136 136 132* 138  K 4.0 4.5 4.3 3.6 3.5  CL 106 103 105 103 106  CO2 24 22 21* 23 28  GLUCOSE 102* 249* 224* 121* 122*  BUN 19 34* 29* 18 13  CREATININE 0.79 0.77 0.74 0.76 0.68  CALCIUM 9.6 9.4 8.6* 8.4* 8.3*  MG 2.1  --   --   --   --    GFR: Estimated Creatinine Clearance: 78.2 mL/min (by C-G formula based on SCr of 0.68 mg/dL). Liver Function Tests: Recent Labs  Lab 03/30/19 [phone] 04/02/19 1716  AST 23 20  ALT 16 18  ALKPHOS 81 78  BILITOT 0.5 0.8  PROT 8.0 7.8  ALBUMIN 4.4 4.2   No results for input(s): LIPASE, AMYLASE in the last 168 hours. No results for input(s): AMMONIA in the last 168 hours. Coagulation Profile: No results for input(s): INR, PROTIME in the last 168 hours. Cardiac Enzymes: Recent Labs  Lab 04/03/19 0019  CKTOTAL 30*   BNP (last 3 results) No results for input(s): PROBNP in the last 8760 hours. HbA1C: No results for input(s): HGBA1C in the last 72 hours. CBG: No results for input(s): GLUCAP in the last 168 hours. Lipid Profile: No results for input(s): CHOL, HDL, LDLCALC, TRIG, CHOLHDL, LDLDIRECT in the last 72 hours. Thyroid Function Tests: No results for input(s): TSH, T4TOTAL, FREET4, T3FREE,  THYROIDAB in the last 72 hours. Anemia Panel: No results for input(s): VITAMINB12, FOLATE, FERRITIN, TIBC, IRON, RETICCTPCT in the last 72 hours. Sepsis Labs: Recent Labs  Lab 04/03/19 0019 04/03/19 0331 04/03/19 1502 04/03/19 1758  PROCALCITON  --   --  <0.10  --   LATICACIDVEN 2.2* 3.0* 1.9 1.9    Recent Results (from the past 240 hour(s))  Culture, blood (routine x 2)     Status: None (Preliminary result)   Collection Time: 04/02/19  6:39 PM   Specimen: BLOOD  Result Value Ref Range Status   Specimen Description BLOOD RIGHT ANTECUBITAL  Final   Special Requests   Final    BOTTLES DRAWN AEROBIC AND ANAEROBIC Blood Culture adequate volume   Culture   Final    NO GROWTH 2 DAYS Performed at [facility], [address], [place], [place] [address]    Report Status PENDING  Incomplete  Culture, blood (routine x 2)     Status: None (Preliminary result)   Collection Time: 04/02/19  6:39 PM   Specimen: BLOOD  Result Value Ref Range Status   Specimen Description BLOOD BLOOD LEFT FOREARM  Final   Special Requests   Final    BOTTLES DRAWN AEROBIC AND ANAEROBIC Blood Culture adequate volume   Culture   Final    NO GROWTH 2 DAYS Performed at [facility], [address], [place], [place] [address]    Report Status PENDING  Incomplete         Radiology Studies: No results found.      Scheduled Meds: . enoxaparin (LOVENOX) injection  40 mg Subcutaneous Q24H   Continuous Infusions: . sodium chloride 75 mL/hr at 04/04/19 1535  . ceFEPime (MAXIPIME) IV 2 g (04/05/19 0529)  . vancomycin 1,250 mg (04/04/19 2339)     LOS: 2 days    Time spent: 30 mins    Wyvonnia Dusky, MD [facility] Pager [phone]  If 7PM-7AM, please contact night-coverage [email or web address] 04/05/2019, 8:07 AM

## 2019-04-05 NOTE — Consult Note (Signed)
WOC consult requested for right trunk/neck cellulitis.  It appears to have resolved at this time; assessed area with primary tem and oncology physician at the bedside. No further redness, edema or drainage, small area of dark scab 1X1cm.  No topical treatment is indicated at this time.  Please re-consult if further assistance is needed.  Thank-you,  Julien Girt MSN, [place], [place], [place], [place]

## 2019-04-05 NOTE — Consult Note (Signed)
[facility] Consult Note  MRN : [id number]  Connie Cameron is a 59 y.o. (August 28, 1960) female who presents with chief complaint of  Chief Complaint  Patient presents with  . 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  Patient is currently undergoing chemotherapy and radiation.  On April 02, 2019 the patient noted some swelling to her Port-A-Cath area and some associated soreness.  The Port-A-Cath was placed on March 24, 2019.  Patient was encouraged by her oncologist to seek further attention our emergency department.   Patient endorses a history of 10 out of 10 pain to the right upper chest wall and neck.  Denies any fever, nausea vomiting.  Denies any issues with the functioning of her Port-A-Cath.  Denies any right upper extremity/face swelling. Patient admitted for IV ABX. Patient notes improvement in erythema and pain since initiation of IV ABX.   CTA chest 04/02/19: 1. Subcutaneous fat stranding posterior right shoulder, which may reflect cellulitis or posttraumatic change. Please correlate with direct visual inspection. 2. Stable 5 mm left lower lobe pulmonary nodule, nonspecific. Routine follow up recommended per protocol for patient's known head and neck cancer. 3. 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 4. Otherwise no acute intrathoracic process.  Vascular Surgery was consulted by Dr. Mike Gip for further recommendations.  Current Facility-Administered Medications  Medication Dose Route Frequency Provider Last Rate Last Admin  . 0.9 %  sodium chloride infusion   Intravenous Continuous Wyvonnia Dusky, MD 75 mL/hr at 04/05/19 0942 New Bag at 04/05/19 0942  . acetaminophen (TYLENOL) tablet 650 mg  650 mg Oral Q6H PRN Nicolette Bang, DO    650 mg at 04/04/19 [id number]   Or  . acetaminophen (TYLENOL) suppository 650 mg  650 mg Rectal Q6H PRN Nicolette Bang, DO      . bisacodyl (DULCOLAX) EC tablet 10 mg  10 mg Oral Daily PRN Wyvonnia Dusky, MD   10 mg at 04/04/19 [id number]  . ceFEPIme (MAXIPIME) 2 g in sodium chloride 0.9 % 100 mL IVPB  2 g Intravenous Q8H Nicolette Bang, DO 200 mL/hr at 04/05/19 0529 2 g at 04/05/19 0529  . enoxaparin (LOVENOX) injection 40 mg  40 mg Subcutaneous Q24H Nicolette Bang, DO   40 mg at 04/04/19 2338  . morphine 4 MG/ML injection 4 mg  4 mg Intravenous Q2H PRN Nicolette Bang, DO   4 mg at 04/05/19 0945  . ondansetron (ZOFRAN) injection 4 mg  4 mg Intravenous Q8H PRN Wyvonnia Dusky, MD      . ondansetron (ZOFRAN-ODT) disintegrating tablet 4 mg  4 mg Oral Q8H PRN Wyvonnia Dusky, MD   4 mg at 04/04/19 1052  . oxyCODONE-acetaminophen (PERCOCET) 7.5-325 MG per tablet 1 tablet  1 tablet Oral Q6H PRN Wyvonnia Dusky, MD   1 tablet at 04/05/19 0527  . vancomycin (VANCOREADY) IVPB 1250 mg/250 mL  1,250 mg Intravenous Q24H Nicolette Bang, DO 166.7 mL/hr at 04/04/19 2339 1,250 mg at 04/04/19 2339   Past Medical History:  Diagnosis Date  . Throat cancer ([place]) 2021   Past Surgical History:  Procedure Laterality Date  . EXCISION MASS NECK Left 01/18/2019   Procedure: EXCISION MASS NECK/NODE;  Surgeon: Margaretha Sheffield, MD;  Location: [facility];  Service: ENT;  Laterality: Left;  . LARYNGOSCOPY Bilateral 02/22/2019   Procedure: MICROSCOPIC DIRECT LARYNGOSCOPY AND BIOPSY;  Surgeon: Margaretha Sheffield, MD;  Location: [facility];  Service: ENT;  Laterality: Bilateral;  . PORTA CATH INSERTION N/A 03/24/2019   Procedure: PORTA CATH INSERTION;  Surgeon: Katha Cabal, MD;  Location: [facility];  Service: Cardiovascular;  Laterality: N/A;  . TONSILLECTOMY    . TUBAL LIGATION     Social History Social History   Tobacco Use  . Smoking status: Never  Smoker  . Smokeless tobacco: Never Used  Substance Use Topics  . Alcohol use: Not Currently  . Drug use: No   Family History Family History  Problem Relation Age of Onset  . Breast cancer Sister   . Brain cancer Paternal Grandfather   Denies family history of peripheral artery disease, venous disease or renal disease.  No Known Allergies  REVIEW OF SYSTEMS (Negative unless checked)  Constitutional: [] Weight loss  [] Fever  [] Chills Cardiac: [] Chest pain   [] Chest pressure   [] Palpitations   [] Shortness of breath when laying flat   [] Shortness of breath at rest   [] Shortness of breath with exertion. Vascular:  [] Pain in legs with walking   [] Pain in legs at rest   [] Pain in legs when laying flat   [] Claudication   [] Pain in feet when walking  [] Pain in feet at rest  [] Pain in feet when laying flat   [] History of DVT   [] Phlebitis   [] Swelling in legs   [] Varicose veins   [] Non-healing ulcers Pulmonary:   [] Uses home oxygen   [] Productive cough   [] Hemoptysis   [] Wheeze  [] COPD   [] Asthma Neurologic:  [] Dizziness  [] Blackouts   [] Seizures   [] History of stroke   [] History of TIA  [] Aphasia   [] Temporary blindness   [] Dysphagia   [] Weakness or numbness in arms   [] Weakness or numbness in legs Musculoskeletal:  [] Arthritis   [] Joint swelling   [] Joint pain   [] Low back pain Hematologic:  [] Easy bruising  [] Easy bleeding   [] Hypercoagulable state   [] Anemic  [] Hepatitis Gastrointestinal:  [] Blood in stool   [] Vomiting blood  [] Gastroesophageal reflux/heartburn   [] Difficulty swallowing. Genitourinary:  [] Chronic kidney disease   [] Difficult urination  [] Frequent urination  [] Burning with urination   [] Blood in urine Skin:  [x] Rashes   [] Ulcers   [] Wounds Psychological:  [] History of anxiety   []  History of major depression.  Positive for head and neck cancer status post Port-A-Cath insertion  Physical Examination  Vitals:   04/04/19 1030 04/04/19 1148 04/04/19 2045 04/05/19 0459  BP:  127/84 130/80 140/84 115/72  Pulse: 86 87 84 90  Resp: 18  20 20   Temp: 98.4 F (36.9 C) 98.2 F (36.8 C) 98.8 F (37.1 C) 98.9 F (37.2 C)  TempSrc: Oral  Oral Oral  SpO2: 96% 96% 98% 95%  Weight:      Height:       Body mass index is 32.42 kg/m. Gen:  WD/WN, NAD Head: [place]/AT, No temporalis wasting. Prominent temp pulse not noted.  No swelling to the face noted. Ear/Nose/Throat: Hearing grossly intact, nares w/o erythema or drainage, oropharynx w/o Erythema/Exudate Eyes: Sclera non-icteric, conjunctiva clear Neck: Trachea midline.  No JVD.  Chest: Port-A-Cath site -clean dry intact.  No erythema.  Nontender to palpation.  No swelling/fluctuance noted. Pulmonary:  Good air movement, respirations not labored, equal bilaterally.  Cardiac: RRR, normal S1, S2. Vascular:  Vessel Right Left  Radial  Palpable Palpable  Ulnar Palpable Palpable  Brachial Palpable Palpable                           Right upper extremity: Upper arm/lower arm soft.  No swelling noted.  2+ radial pulse.  Warm.  Gastrointestinal: soft, non-tender/non-distended. No guarding/reflex.  Musculoskeletal: M/S 5/5 throughout.  Extremities without ischemic changes.  No deformity or atrophy. No edema. Neurologic: Sensation grossly intact in extremities.  Symmetrical.  Speech is fluent. Motor exam as listed above. Psychiatric: Judgment intact, Mood & affect appropriate for pt's clinical situation. Dermatologic: Erythema tracking from shoulder towards back.  Tender to palpation.  Skin intact. Lymph : No Cervical, Axillary, or Inguinal lymphadenopathy.  CBC Lab Results  Component Value Date   WBC 10.6 (H) 04/05/2019   HGB 10.7 (L) 04/05/2019   HCT 32.4 (L) 04/05/2019   MCV 89.3 04/05/2019   PLT 165 04/05/2019   BMET    Component Value Date/Time   NA 138 04/05/2019 0352   NA 140 06/24/2011 1709   K 3.5 04/05/2019 0352   K 4.3 06/24/2011 1709   CL 106 04/05/2019 0352   CL 105 06/24/2011 1709   CO2 28  04/05/2019 0352   CO2 30 06/24/2011 1709   GLUCOSE 122 (H) 04/05/2019 0352   GLUCOSE 101 (H) 06/24/2011 1709   BUN 13 04/05/2019 0352   BUN 17 06/24/2011 1709   CREATININE 0.68 04/05/2019 0352   CREATININE 0.91 06/24/2011 1709   CALCIUM 8.3 (L) 04/05/2019 0352   CALCIUM 9.4 06/24/2011 1709   GFRNONAA >60 04/05/2019 0352   GFRNONAA >60 06/24/2011 1709   GFRAA >60 04/05/2019 0352   GFRAA >60 06/24/2011 1709   Estimated Creatinine Clearance: 78.2 mL/min (by C-G formula based on SCr of 0.68 mg/dL).  COAG Lab Results  Component Value Date   INR 1.0 12/22/2018   Radiology CT CHEST W CONTRAST  Result Date: 04/02/2019 CLINICAL DATA:  Right upper chest pain and swelling, elevated white blood cell count, history of head neck cancer EXAM: CT CHEST WITH CONTRAST TECHNIQUE: Multidetector CT imaging of the chest was performed during intravenous contrast administration. CONTRAST:  27mL OMNIPAQUE IOHEXOL 300 MG/ML  SOLN COMPARISON:  02/04/2019 FINDINGS: Cardiovascular: For heart is unremarkable without pericardial effusion. Mild atherosclerosis within the LAD distribution of the coronary vasculature. Aorta is normal in caliber with no evidence of dissection. While not optimized for the evaluation of the pulmonary vasculature, there is sufficient contrast enhancement and visualization to exclude proximal and segmental pulmonary emboli. Mediastinum/Nodes: There is a right-sided chest wall port via internal jugular approach, tip within the superior vena cava. No pathologic mediastinal, hilar, or axillary adenopathy. Lungs/Pleura: No airspace disease, effusion, or pneumothorax. 5 mm subpleural left lower lobe pulmonary nodule seen on recent PET scan again identified, reference image 71 of series 2. Central airways are patent. Upper Abdomen: No acute abnormality. Musculoskeletal: There is subcutaneous edema within the superior posterior right shoulder, overlying the trapezius musculature. The muscle itself  appears unremarkable with no intramuscular edema or abnormal enhancement. This is a nonspecific finding and could reflect cellulitis or posttraumatic change. Please correlate with direct visual inspection. The distal right subclavian and axillary veins are diminutive, may reflect chronic thrombosis. There are no acute or destructive bony lesions. Prior healed left rib fractures are noted. Reconstructed images demonstrate no additional findings. IMPRESSION: 1. Subcutaneous fat stranding posterior right shoulder, which may reflect cellulitis or posttraumatic change. Please correlate with direct visual inspection.  2. Stable 5 mm left lower lobe pulmonary nodule, nonspecific. Routine follow up recommended per protocol for patient's known head and neck cancer. 3. 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 4. Otherwise no acute intrathoracic process. Electronically Signed   By: Randa Ngo M.D.   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  Patient is currently undergoing chemotherapy and radiation.  1.  Right neck and back cellulitis: Port-A-Cath placed on March 24, 2019.  Patient has started chemotherapy and radiation.  On physical exam, area surrounded the port is clean dry and intact.  No erythema, nontender, no swelling or drainage noted.  Face/right upper extremity without swelling.  Clinical improvement with IV antibiotics.  Recommend observation at this point.  Continue IV antibiotics.  We will continue to monitor.  2.  Head and neck cancer: Patient being followed by oncologist and radiation oncologist. Undergoing chemotherapy and radiation.  Discussed with Dr. Francene Castle, PA-C  04/05/2019 11:59 AM  This  note was created with Dragon medical transcription system.  Any error is purely unintentional

## 2019-04-06 ENCOUNTER — Ambulatory Visit: Payer: Medicaid Other

## 2019-04-06 DIAGNOSIS — E876 Hypokalemia: Secondary | ICD-10-CM

## 2019-04-06 LAB — CBC
HCT: 36.7 % (ref 36.0–46.0)
Hemoglobin: 12 g/dL (ref 12.0–15.0)
MCH: 29.4 pg (ref 26.0–34.0)
MCHC: 32.7 g/dL (ref 30.0–36.0)
MCV: 90 fL (ref 80.0–100.0)
Platelets: 209 10*3/uL (ref 150–400)
RBC: 4.08 MIL/uL (ref 3.87–5.11)
RDW: 12.6 % (ref 11.5–15.5)
WBC: 8 10*3/uL (ref 4.0–10.5)
nRBC: 0 % (ref 0.0–0.2)

## 2019-04-06 LAB — BASIC METABOLIC PANEL
Anion gap: 7 (ref 5–15)
BUN: 13 mg/dL (ref 6–20)
CO2: 29 mmol/L (ref 22–32)
Calcium: 8.5 mg/dL — ABNORMAL LOW (ref 8.9–10.3)
Chloride: 104 mmol/L (ref 98–111)
Creatinine, Ser: 0.63 mg/dL (ref 0.44–1.00)
GFR calc Af Amer: >60 mL/min (ref >60)
GFR calc non Af Amer: >60 mL/min (ref >60)
Glucose, Bld: 130 mg/dL — ABNORMAL HIGH (ref 70–99)
Potassium: 3.3 mmol/L — ABNORMAL LOW (ref 3.5–5.1)
Sodium: 140 mmol/L (ref 135–145)

## 2019-04-06 MED ORDER — OXYCODONE-ACETAMINOPHEN 7.5-325 MG PO TABS: 1.0000 | ORAL_TABLET | Freq: Four times a day (QID) | ORAL | 0 refills | Status: AC | PRN

## 2019-04-06 MED ORDER — POTASSIUM CHLORIDE CRYS ER 20 MEQ PO TBCR
40.0000 meq | EXTENDED_RELEASE_TABLET | Freq: Once | ORAL | Status: AC
  Administered 2019-04-06: 40 meq via ORAL
  Filled 2019-04-06: qty 2

## 2019-04-06 MED ORDER — DOXYCYCLINE MONOHYDRATE 100 MG PO TABS: 100.0000 mg | ORAL_TABLET | Freq: Two times a day (BID) | ORAL | 0 refills | Status: AC

## 2019-04-06 NOTE — Care Management (Signed)
Patient discharged home today.  Provided goodrx coupons for discharge medications

## 2019-04-06 NOTE — Discharge Summary (Signed)
Physician Discharge Summary  Connie Cameron [id number] DOB: [birth year] DOA: 04/02/2019  PCP: Patient, No Pcp Per  Admit date: 04/02/2019 Discharge date: 04/06/2019  Admitted From: home Disposition:  home  Recommendations for Outpatient Follow-up:  1. Follow up with PCP in 1 week  Home Health: no Equipment/Devices:  Discharge Condition: stable CODE STATUS: full  Diet recommendation: regular  Brief/Interim Summary: HPI was taken from Dr. Juleen China: Connie Cameron is 59 y.o. female with carcinoma of base of tongue, currently undergoing treatment, who presents for redness, pain, and edema on the right upper chest, right upper back and neck. This started earlier today. Reports pain is excruciating. It is non-radiating. Had radiation therapy this morning. Reports she took Tylenol with minimal improvement in pain.    ED work-up/course:  Patient presents with severe right upper chest wall pain in the area of trapezius, supraclavicular thorax. Vital signs unremarkable. Patient given initial IV Toradol 15 mg for pain relief, fluid bolus. Initial lab panel unremarkable except for a lactate of 2.6 and white blood cell count of 23,000 which is predominantly neutrophils. Blood cultures were obtained despite vital signs not meeting sepsis criteria. Patient still in severe pain despite Toradol so she was given 1 mg IV Dilaudid. CT scan consistent with cellulitis of the area without evidence of deeper space infection.  Second lactate after fluids is unchanged, increased from 2.6-2.7. Given her cancer treatment, nonclearing lactate, marked leukocytosis, will plan to start IV antibiotics for cellulitis and admit for further management. Evolving clinical picture is concerning for early sepsis   Hospital Course from Dr. Lenise Herald 2/27-04/06/19: Pt was found to have cellulitis of the trunk and was started on IV vanco & cefepime. Pt was initially possibly thought to have a radiation therapy burn  which was r/o. The cellulitis improved w/ IV abxs. Of note, vascular surgery evaluated the pt's port and did not feel the port was infected so below treatment continued.   Discharge Diagnoses:  Active Problems:   Carcinoma of base of tongue (HCC)   Squamous cell carcinoma of head and neck (HCC)   Cellulitis  Cellulitis of the trunk: Switched to po abxs today, w/ po doxycycline. Radiation therapy burn r/o. Blood cxs NGTD. Elevated CRP. Pro-cal 0.10. Afebrile for 24 hours at least   Lactic acidosis: resolved  Leukocytosis: resolved  Carcinoma of base of the tongue: s/p radiation on 04/02/19. Management per onco as outpatient   Hypokalemia: KCl repleated. Will continue to monitor   Hyperglycemia: no hx of DM. Possibly secondary to steroid use. Will continue to monitor    Discharge Instructions  Discharge Instructions    Diet general   Complete by: As directed    Discharge instructions   Complete by: As directed    F/u PCP in 1 week   Increase activity slowly   Complete by: As directed      Allergies as of 04/06/2019   No Known Allergies     Medication List    TAKE these medications   dexamethasone 4 MG tablet Commonly known as: DECADRON Take 2 tablets by mouth once a day on the day after chemotherapy and then take 2 tablets two times a day for 2 days. Take with food.   doxycycline 100 MG tablet Commonly known as: ADOXA Take 1 tablet (100 mg total) by mouth 2 (two) times daily for 4 days.   lidocaine-prilocaine cream Commonly known as: EMLA Apply to affected area once   LORazepam 0.5 MG tablet Commonly known as:  Ativan Take 1 tablet (0.5 mg total) by mouth every 6 (six) hours as needed (Nausea or vomiting).   ondansetron 8 MG tablet Commonly known as: Zofran Take 1 tablet (8 mg total) by mouth 2 (two) times daily as needed. Start on the third day after chemotherapy.   oxyCODONE-acetaminophen 7.5-325 MG tablet Commonly known as: PERCOCET Take 1 tablet by  mouth every 6 (six) hours as needed for up to 5 days for moderate pain or severe pain.       No Known Allergies  Consultations:  Vascular surgery   Wound care   Procedures/Studies: CT CHEST W CONTRAST  Result Date: 04/02/2019 CLINICAL DATA:  Right upper chest pain and swelling, elevated white blood cell count, history of head neck cancer EXAM: CT CHEST WITH CONTRAST TECHNIQUE: Multidetector CT imaging of the chest was performed during intravenous contrast administration. CONTRAST:  70mL OMNIPAQUE IOHEXOL 300 MG/ML  SOLN COMPARISON:  02/04/2019 FINDINGS: Cardiovascular: For heart is unremarkable without pericardial effusion. Mild atherosclerosis within the LAD distribution of the coronary vasculature. Aorta is normal in caliber with no evidence of dissection. While not optimized for the evaluation of the pulmonary vasculature, there is sufficient contrast enhancement and visualization to exclude proximal and segmental pulmonary emboli. Mediastinum/Nodes: There is a right-sided chest wall port via internal jugular approach, tip within the superior vena cava. No pathologic mediastinal, hilar, or axillary adenopathy. Lungs/Pleura: No airspace disease, effusion, or pneumothorax. 5 mm subpleural left lower lobe pulmonary nodule seen on recent PET scan again identified, reference image 71 of series 2. Central airways are patent. Upper Abdomen: No acute abnormality. Musculoskeletal: There is subcutaneous edema within the superior posterior right shoulder, overlying the trapezius musculature. The muscle itself appears unremarkable with no intramuscular edema or abnormal enhancement. This is a nonspecific finding and could reflect cellulitis or posttraumatic change. Please correlate with direct visual inspection. The distal right subclavian and axillary veins are diminutive, may reflect chronic thrombosis. There are no acute or destructive bony lesions. Prior healed left rib fractures are noted. Reconstructed  images demonstrate no additional findings. IMPRESSION: 1. Subcutaneous fat stranding posterior right shoulder, which may reflect cellulitis or posttraumatic change. Please correlate with direct visual inspection. 2. Stable 5 mm left lower lobe pulmonary nodule, nonspecific. Routine follow up recommended per protocol for patient's known head and neck cancer. 3. 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 4. Otherwise no acute intrathoracic process. Electronically Signed   By: Randa Ngo M.D.   On: 04/02/2019 19:58   PERIPHERAL VASCULAR CATHETERIZATION  Result Date: 03/24/2019 See op note     Subjective: Pt c/o right upper back pain   Discharge Exam: Vitals:   04/05/19 2022 04/06/19 0436  BP: 118/77 123/77  Pulse: 83 80  Resp: 20 20  Temp: 98.3 F (36.8 C) 98.7 F (37.1 C)  SpO2: 97% 96%   Vitals:   04/05/19 0459 04/05/19 1200 04/05/19 2022 04/06/19 0436  BP: 115/72 116/72 118/77 123/77  Pulse: 90 79 83 80  Resp: 20 15 20 20   Temp: 98.9 F (37.2 C) 98.9 F (37.2 C) 98.3 F (36.8 C) 98.7 F (37.1 C)  TempSrc: Oral Oral Axillary Oral  SpO2: 95% 96% 97% 96%  Weight:      Height:        General: Pt is alert, awake, not in acute distress Cardiovascular: S1/S2 +, no rubs, no gallops Respiratory: diminished breath sounds b/l. No wheezes Abdominal: Soft, NT, ND, bowel sounds + Extremities: no edema,  no cyanosis    The results of significant diagnostics from this hospitalization (including imaging, microbiology, ancillary and laboratory) are listed below for reference.     Microbiology: Recent Results (from the past 240 hour(s))  Culture, blood (routine x 2)     Status: None (Preliminary result)   Collection Time: 04/02/19  6:39 PM   Specimen: BLOOD  Result Value Ref Range Status   Specimen Description BLOOD RIGHT ANTECUBITAL  Final   Special Requests   Final    BOTTLES DRAWN AEROBIC AND ANAEROBIC Blood Culture adequate volume    Culture   Final    NO GROWTH 4 DAYS Performed at [facility], [address] [place], [place] [address]    Report Status PENDING  Incomplete  Culture, blood (routine x 2)     Status: None (Preliminary result)   Collection Time: 04/02/19  6:39 PM   Specimen: BLOOD  Result Value Ref Range Status   Specimen Description BLOOD BLOOD LEFT FOREARM  Final   Special Requests   Final    BOTTLES DRAWN AEROBIC AND ANAEROBIC Blood Culture adequate volume   Culture   Final    NO GROWTH 4 DAYS Performed at [facility], [address], [place], [place] [address]    Report Status PENDING  Incomplete     Labs: BNP (last 3 results) No results for input(s): BNP in the last 8760 hours. Basic Metabolic Panel: Recent Labs  Lab 04/02/19 1716 04/03/19 0019 04/04/19 0456 04/05/19 0352 04/06/19 0607  NA 136 136 132* 138 140  K 4.5 4.3 3.6 3.5 3.3*  CL 103 105 103 106 104  CO2 22 21* 23 28 29   GLUCOSE 249* 224* 121* 122* 130*  BUN 34* 29* 18 13 13   CREATININE 0.77 0.74 0.76 0.68 0.63  CALCIUM 9.4 8.6* 8.4* 8.3* 8.5*   Liver Function Tests: Recent Labs  Lab 04/02/19 1716  AST 20  ALT 18  ALKPHOS 78  BILITOT 0.8  PROT 7.8  ALBUMIN 4.2   No results for input(s): LIPASE, AMYLASE in the last 168 hours. No results for input(s): AMMONIA in the last 168 hours. CBC: Recent Labs  Lab 04/02/19 1716 04/03/19 0019 04/04/19 0456 04/05/19 0352 04/06/19 0607  WBC 23.4* 21.2* 13.3* 10.6* 8.0  NEUTROABS 21.6*  --   --   --   --   HGB 13.6 12.2 10.8* 10.7* 12.0  HCT 40.7 38.0 32.5* 32.4* 36.7  MCV 88.5 92.2 89.3 89.3 90.0  PLT 295 249 170 165 209   Cardiac Enzymes: Recent Labs  Lab 04/03/19 0019  CKTOTAL 30*   BNP: Invalid input(s): POCBNP CBG: No results for input(s): GLUCAP in the last 168 hours. D-Dimer No results for input(s): DDIMER in the last 72 hours. Hgb A1c No results for input(s): HGBA1C in the last 72 hours. Lipid Profile No results for input(s):  CHOL, HDL, LDLCALC, TRIG, CHOLHDL, LDLDIRECT in the last 72 hours. Thyroid function studies No results for input(s): TSH, T4TOTAL, T3FREE, THYROIDAB in the last 72 hours.  Invalid input(s): FREET3 Anemia work up No results for input(s): VITAMINB12, FOLATE, FERRITIN, TIBC, IRON, RETICCTPCT in the last 72 hours. Urinalysis    Component Value Date/Time   COLORURINE STRAW (A) 04/02/2019 1716   APPEARANCEUR CLEAR (A) 04/02/2019 1716   APPEARANCEUR Hazy 06/24/2011 1709   LABSPEC 1.008 04/02/2019 1716   LABSPEC 1.010 06/24/2011 1709   PHURINE 5.0 04/02/2019 [place] 04/02/2019 1716   GLUCOSEU Negative 06/24/2011 1709  HGBUR NEGATIVE 04/02/2019 [place] 04/02/2019 1716   BILIRUBINUR Negative 06/24/2011 1709   KETONESUR NEGATIVE 04/02/2019 1716   PROTEINUR NEGATIVE 04/02/2019 1716   NITRITE NEGATIVE 04/02/2019 [place] 04/02/2019 1716   LEUKOCYTESUR 1+ 06/24/2011 1709   Sepsis Labs Invalid input(s): PROCALCITONIN,  WBC,  LACTICIDVEN Microbiology Recent Results (from the past 240 hour(s))  Culture, blood (routine x 2)     Status: None (Preliminary result)   Collection Time: 04/02/19  6:39 PM   Specimen: BLOOD  Result Value Ref Range Status   Specimen Description BLOOD RIGHT ANTECUBITAL  Final   Special Requests   Final    BOTTLES DRAWN AEROBIC AND ANAEROBIC Blood Culture adequate volume   Culture   Final    NO GROWTH 4 DAYS Performed at [facility], [address] [place], [place] [address]    Report Status PENDING  Incomplete  Culture, blood (routine x 2)     Status: None (Preliminary result)   Collection Time: 04/02/19  6:39 PM   Specimen: BLOOD  Result Value Ref Range Status   Specimen Description BLOOD BLOOD LEFT FOREARM  Final   Special Requests   Final    BOTTLES DRAWN AEROBIC AND ANAEROBIC Blood Culture adequate volume   Culture   Final    NO GROWTH 4 DAYS Performed at [facility], [address] [place], [place] [address]    Report Status PENDING  Incomplete     Time coordinating discharge: Over 30 minutes  SIGNED:   Wyvonnia Dusky, MD  [facility] 04/06/2019, 11:11 AM Pager   If 7PM-7AM, please contact night-coverage [email or web address]

## 2019-04-07 ENCOUNTER — Other Ambulatory Visit: Payer: Self-pay

## 2019-04-07 ENCOUNTER — Ambulatory Visit
Admission: RE | Admit: 2019-04-07 | Discharge: 2019-04-07 | Disposition: A | Discharge status: Home / Self Care | Payer: Medicaid Other | Source: Ambulatory Visit | Attending: Radiation Oncology | Admitting: Radiation Oncology

## 2019-04-07 DIAGNOSIS — Z510 Encounter for antineoplastic radiation therapy: Secondary | ICD-10-CM | POA: Insufficient documentation

## 2019-04-07 DIAGNOSIS — C01 Malignant neoplasm of base of tongue: Secondary | ICD-10-CM | POA: Insufficient documentation

## 2019-04-07 LAB — CULTURE, BLOOD (ROUTINE X 2)
Culture: NO GROWTH
Culture: NO GROWTH
Special Requests: ADEQUATE
Special Requests: ADEQUATE

## 2019-04-08 ENCOUNTER — Ambulatory Visit
Admission: RE | Admit: 2019-04-08 | Discharge: 2019-04-08 | Disposition: A | Discharge status: Home / Self Care | Payer: Medicaid Other | Source: Ambulatory Visit | Attending: Radiation Oncology | Admitting: Radiation Oncology

## 2019-04-08 ENCOUNTER — Other Ambulatory Visit: Payer: Self-pay

## 2019-04-08 DIAGNOSIS — Z510 Encounter for antineoplastic radiation therapy: Secondary | ICD-10-CM | POA: Diagnosis not present

## 2019-04-09 ENCOUNTER — Ambulatory Visit
Admission: RE | Admit: 2019-04-09 | Discharge: 2019-04-09 | Disposition: A | Discharge status: Home / Self Care | Payer: Medicaid Other | Source: Ambulatory Visit | Attending: Radiation Oncology | Admitting: Radiation Oncology

## 2019-04-09 ENCOUNTER — Encounter: Payer: Self-pay | Admitting: Nurse Practitioner

## 2019-04-09 ENCOUNTER — Other Ambulatory Visit: Payer: Self-pay

## 2019-04-09 DIAGNOSIS — Z510 Encounter for antineoplastic radiation therapy: Secondary | ICD-10-CM | POA: Diagnosis not present

## 2019-04-09 NOTE — Patient Instructions (Signed)
Please call the [facility] at [phone] to establish care with a Primary Care Provider.   In the meantime, you can access care through the following free and reduced cost health care locations in [place]:   -[facility] ([address], [place], [place] [address]- [phone])  -[facility] ([address], [address], [place] [address]- [phone])  -[facility] ([address], [place], [place] [address]- [phone])  -[facility] ([address], [place]- [phone])  - [facility] [place]- [phone])  

## 2019-04-12 ENCOUNTER — Ambulatory Visit
Admission: RE | Admit: 2019-04-12 | Discharge: 2019-04-12 | Disposition: A | Discharge status: Home / Self Care | Payer: Medicaid Other | Source: Ambulatory Visit | Attending: Radiation Oncology | Admitting: Radiation Oncology

## 2019-04-12 ENCOUNTER — Other Ambulatory Visit: Payer: Self-pay

## 2019-04-12 DIAGNOSIS — Z510 Encounter for antineoplastic radiation therapy: Secondary | ICD-10-CM | POA: Diagnosis not present

## 2019-04-13 ENCOUNTER — Other Ambulatory Visit: Payer: Self-pay | Admitting: *Deleted

## 2019-04-13 ENCOUNTER — Ambulatory Visit
Admission: RE | Admit: 2019-04-13 | Discharge: 2019-04-13 | Disposition: A | Discharge status: Home / Self Care | Payer: Medicaid Other | Source: Ambulatory Visit | Attending: Radiation Oncology | Admitting: Radiation Oncology

## 2019-04-13 ENCOUNTER — Other Ambulatory Visit: Payer: Self-pay

## 2019-04-13 DIAGNOSIS — Z510 Encounter for antineoplastic radiation therapy: Secondary | ICD-10-CM | POA: Diagnosis not present

## 2019-04-13 MED ORDER — SUCRALFATE 1 G PO TABS: 1.0000 g | ORAL_TABLET | Freq: Three times a day (TID) | ORAL | 6 refills | Status: DC

## 2019-04-14 ENCOUNTER — Ambulatory Visit
Admission: RE | Admit: 2019-04-14 | Discharge: 2019-04-14 | Disposition: A | Discharge status: Home / Self Care | Payer: Medicaid Other | Source: Ambulatory Visit | Attending: Radiation Oncology | Admitting: Radiation Oncology

## 2019-04-14 DIAGNOSIS — Z510 Encounter for antineoplastic radiation therapy: Secondary | ICD-10-CM | POA: Diagnosis not present

## 2019-04-15 ENCOUNTER — Ambulatory Visit
Admission: RE | Admit: 2019-04-15 | Discharge: 2019-04-15 | Disposition: A | Discharge status: Home / Self Care | Payer: Medicaid Other | Source: Ambulatory Visit | Attending: Radiation Oncology | Admitting: Radiation Oncology

## 2019-04-15 ENCOUNTER — Inpatient Hospital Stay: Payer: Medicaid Other | Attending: Oncology

## 2019-04-15 DIAGNOSIS — Z5111 Encounter for antineoplastic chemotherapy: Secondary | ICD-10-CM | POA: Insufficient documentation

## 2019-04-15 DIAGNOSIS — R911 Solitary pulmonary nodule: Secondary | ICD-10-CM | POA: Insufficient documentation

## 2019-04-15 DIAGNOSIS — R87820 Cervical low risk human papillomavirus (HPV) DNA test positive: Secondary | ICD-10-CM | POA: Insufficient documentation

## 2019-04-15 DIAGNOSIS — H9192 Unspecified hearing loss, left ear: Secondary | ICD-10-CM | POA: Insufficient documentation

## 2019-04-15 DIAGNOSIS — Z510 Encounter for antineoplastic radiation therapy: Secondary | ICD-10-CM | POA: Diagnosis not present

## 2019-04-15 DIAGNOSIS — C01 Malignant neoplasm of base of tongue: Secondary | ICD-10-CM | POA: Insufficient documentation

## 2019-04-15 DIAGNOSIS — C642 Malignant neoplasm of left kidney, except renal pelvis: Secondary | ICD-10-CM | POA: Insufficient documentation

## 2019-04-15 DIAGNOSIS — Z79899 Other long term (current) drug therapy: Secondary | ICD-10-CM | POA: Insufficient documentation

## 2019-04-15 DIAGNOSIS — R110 Nausea: Secondary | ICD-10-CM | POA: Insufficient documentation

## 2019-04-15 DIAGNOSIS — R1310 Dysphagia, unspecified: Secondary | ICD-10-CM | POA: Insufficient documentation

## 2019-04-15 DIAGNOSIS — I2510 Atherosclerotic heart disease of native coronary artery without angina pectoris: Secondary | ICD-10-CM | POA: Insufficient documentation

## 2019-04-15 DIAGNOSIS — J029 Acute pharyngitis, unspecified: Secondary | ICD-10-CM | POA: Insufficient documentation

## 2019-04-15 DIAGNOSIS — Y842 Radiological procedure and radiotherapy as the cause of abnormal reaction of the patient, or of later complication, without mention of misadventure at the time of the procedure: Secondary | ICD-10-CM | POA: Insufficient documentation

## 2019-04-15 DIAGNOSIS — Z803 Family history of malignant neoplasm of breast: Secondary | ICD-10-CM | POA: Insufficient documentation

## 2019-04-15 DIAGNOSIS — L03221 Cellulitis of neck: Secondary | ICD-10-CM | POA: Insufficient documentation

## 2019-04-15 DIAGNOSIS — K208 Other esophagitis without bleeding: Secondary | ICD-10-CM | POA: Insufficient documentation

## 2019-04-15 NOTE — Progress Notes (Signed)
Nutrition Follow-up:  Patient with new diagnosis of base of the tongue cancer.  Patient receiving radiation and cisplatin.  Followed by Dr. Janese Banks.  Met with patient following radiation this am.  Patient reports that her gums and throat are getting sore.  Started taking carafate yesterday.  Reports yesterday was able to eat pork chop (cut small) and mashed potatoes and mixed fruit for supper.  Mashed hot dog for lunch and oatmeal with egg for breakfast.  Reports no taste but still trying to eat.     Medications: carafate, ativan, decadron, zofran  Labs: reviewed  Anthropometrics:   Weight 180 lb 6 oz on 3/9 per [facility]ria  182 lb on 2/23 noted 02/22/19 184 lb   NUTRITION DIAGNOSIS: Predicted suboptimal energy intake continues   INTERVENTION:  Reviewed with patient soft, moist protein foods for ease of swallowing.  Handout provided.  Discussed ways to add calories as well.  Patient has contact information    MONITORING, EVALUATION, GOAL: Patient will consume adequate calories and protein to during treatment to maintain weight.    NEXT VISIT: March 18th after radiation  Bryce Cheever B. Zenia Resides, [place], [place] Registered Dietitian [phone] (pager)

## 2019-04-16 ENCOUNTER — Ambulatory Visit
Admission: RE | Admit: 2019-04-16 | Discharge: 2019-04-16 | Disposition: A | Discharge status: Home / Self Care | Payer: Medicaid Other | Source: Ambulatory Visit | Attending: Radiation Oncology | Admitting: Radiation Oncology

## 2019-04-16 DIAGNOSIS — Z510 Encounter for antineoplastic radiation therapy: Secondary | ICD-10-CM | POA: Diagnosis not present

## 2019-04-19 ENCOUNTER — Other Ambulatory Visit: Payer: Self-pay | Admitting: *Deleted

## 2019-04-19 ENCOUNTER — Ambulatory Visit
Admission: RE | Admit: 2019-04-19 | Discharge: 2019-04-19 | Disposition: A | Discharge status: Home / Self Care | Payer: Medicaid Other | Source: Ambulatory Visit | Attending: Radiation Oncology | Admitting: Radiation Oncology

## 2019-04-19 DIAGNOSIS — Z510 Encounter for antineoplastic radiation therapy: Secondary | ICD-10-CM | POA: Diagnosis not present

## 2019-04-19 DIAGNOSIS — C01 Malignant neoplasm of base of tongue: Secondary | ICD-10-CM

## 2019-04-20 ENCOUNTER — Inpatient Hospital Stay: Payer: Medicaid Other

## 2019-04-20 ENCOUNTER — Other Ambulatory Visit: Payer: Self-pay

## 2019-04-20 ENCOUNTER — Encounter: Payer: Self-pay | Admitting: Oncology

## 2019-04-20 ENCOUNTER — Ambulatory Visit
Admission: RE | Admit: 2019-04-20 | Discharge: 2019-04-20 | Disposition: A | Discharge status: Home / Self Care | Payer: Medicaid Other | Source: Ambulatory Visit | Attending: Radiation Oncology | Admitting: Radiation Oncology

## 2019-04-20 ENCOUNTER — Inpatient Hospital Stay (HOSPITAL_BASED_OUTPATIENT_CLINIC_OR_DEPARTMENT_OTHER): Payer: Medicaid Other | Admitting: Oncology

## 2019-04-20 VITALS — BP 138/89 | HR 77 | Temp 97.7°F | Ht 63.0 in | Wt 179.0 lb

## 2019-04-20 VITALS — Resp 18

## 2019-04-20 DIAGNOSIS — T66XXXA Radiation sickness, unspecified, initial encounter: Secondary | ICD-10-CM

## 2019-04-20 DIAGNOSIS — Z5111 Encounter for antineoplastic chemotherapy: Secondary | ICD-10-CM

## 2019-04-20 DIAGNOSIS — R1310 Dysphagia, unspecified: Secondary | ICD-10-CM | POA: Diagnosis not present

## 2019-04-20 DIAGNOSIS — K208 Other esophagitis without bleeding: Secondary | ICD-10-CM

## 2019-04-20 DIAGNOSIS — L03221 Cellulitis of neck: Secondary | ICD-10-CM | POA: Diagnosis not present

## 2019-04-20 DIAGNOSIS — R110 Nausea: Secondary | ICD-10-CM | POA: Diagnosis not present

## 2019-04-20 DIAGNOSIS — Z95828 Presence of other vascular implants and grafts: Secondary | ICD-10-CM

## 2019-04-20 DIAGNOSIS — C01 Malignant neoplasm of base of tongue: Secondary | ICD-10-CM | POA: Diagnosis not present

## 2019-04-20 DIAGNOSIS — Z803 Family history of malignant neoplasm of breast: Secondary | ICD-10-CM | POA: Diagnosis not present

## 2019-04-20 DIAGNOSIS — R87820 Cervical low risk human papillomavirus (HPV) DNA test positive: Secondary | ICD-10-CM | POA: Diagnosis not present

## 2019-04-20 DIAGNOSIS — H9192 Unspecified hearing loss, left ear: Secondary | ICD-10-CM | POA: Diagnosis not present

## 2019-04-20 DIAGNOSIS — R911 Solitary pulmonary nodule: Secondary | ICD-10-CM | POA: Diagnosis not present

## 2019-04-20 DIAGNOSIS — J029 Acute pharyngitis, unspecified: Secondary | ICD-10-CM | POA: Diagnosis not present

## 2019-04-20 DIAGNOSIS — Y842 Radiological procedure and radiotherapy as the cause of abnormal reaction of the patient, or of later complication, without mention of misadventure at the time of the procedure: Secondary | ICD-10-CM | POA: Diagnosis not present

## 2019-04-20 DIAGNOSIS — Z510 Encounter for antineoplastic radiation therapy: Secondary | ICD-10-CM | POA: Diagnosis not present

## 2019-04-20 DIAGNOSIS — C642 Malignant neoplasm of left kidney, except renal pelvis: Secondary | ICD-10-CM | POA: Diagnosis not present

## 2019-04-20 DIAGNOSIS — Z79899 Other long term (current) drug therapy: Secondary | ICD-10-CM | POA: Diagnosis not present

## 2019-04-20 DIAGNOSIS — I2510 Atherosclerotic heart disease of native coronary artery without angina pectoris: Secondary | ICD-10-CM | POA: Diagnosis not present

## 2019-04-20 LAB — CBC WITH DIFFERENTIAL/PLATELET
Abs Immature Granulocytes: 0.02 10*3/uL (ref 0.00–0.07)
Basophils Absolute: 0 10*3/uL (ref 0.0–0.1)
Basophils Relative: 1 %
Eosinophils Absolute: 0 10*3/uL (ref 0.0–0.5)
Eosinophils Relative: 1 %
HCT: 33.8 % — ABNORMAL LOW (ref 36.0–46.0)
Hemoglobin: 11 g/dL — ABNORMAL LOW (ref 12.0–15.0)
Immature Granulocytes: 1 %
Lymphocytes Relative: 20 %
Lymphs Abs: 0.8 10*3/uL (ref 0.7–4.0)
MCH: 29.3 pg (ref 26.0–34.0)
MCHC: 32.5 g/dL (ref 30.0–36.0)
MCV: 90.1 fL (ref 80.0–100.0)
Monocytes Absolute: 0.5 10*3/uL (ref 0.1–1.0)
Monocytes Relative: 12 %
Neutro Abs: 2.6 10*3/uL (ref 1.7–7.7)
Neutrophils Relative %: 65 %
Platelets: 311 10*3/uL (ref 150–400)
RBC: 3.75 MIL/uL — ABNORMAL LOW (ref 3.87–5.11)
RDW: 13.1 % (ref 11.5–15.5)
WBC: 4 10*3/uL (ref 4.0–10.5)
nRBC: 0 % (ref 0.0–0.2)

## 2019-04-20 LAB — BASIC METABOLIC PANEL
Anion gap: 9 (ref 5–15)
BUN: 13 mg/dL (ref 6–20)
CO2: 25 mmol/L (ref 22–32)
Calcium: 9.2 mg/dL (ref 8.9–10.3)
Chloride: 105 mmol/L (ref 98–111)
Creatinine, Ser: 0.6 mg/dL (ref 0.44–1.00)
GFR calc Af Amer: >60 mL/min (ref >60)
GFR calc non Af Amer: >60 mL/min (ref >60)
Glucose, Bld: 129 mg/dL — ABNORMAL HIGH (ref 70–99)
Potassium: 3.9 mmol/L (ref 3.5–5.1)
Sodium: 139 mmol/L (ref 135–145)

## 2019-04-20 LAB — MAGNESIUM: Magnesium: 2.2 mg/dL (ref 1.7–2.4)

## 2019-04-20 MED ORDER — HEPARIN SOD (PORK) LOCK FLUSH 100 UNIT/ML IV SOLN
INTRAVENOUS | Status: AC
  Filled 2019-04-20: qty 5

## 2019-04-20 MED ORDER — POTASSIUM CHLORIDE 2 MEQ/ML IV SOLN
Freq: Once | INTRAVENOUS | Status: AC
  Filled 2019-04-20: qty 1000

## 2019-04-20 MED ORDER — SODIUM CHLORIDE 0.9 % IV SOLN
150.0000 mg | Freq: Once | INTRAVENOUS | Status: AC
  Administered 2019-04-20: 150 mg via INTRAVENOUS
  Filled 2019-04-20: qty 150

## 2019-04-20 MED ORDER — SODIUM CHLORIDE 0.9 % IV SOLN
100.0000 mg/m2 | Freq: Once | INTRAVENOUS | Status: AC
  Administered 2019-04-20: 192 mg via INTRAVENOUS
  Filled 2019-04-20: qty 192

## 2019-04-20 MED ORDER — SODIUM CHLORIDE 0.9% FLUSH
10.0000 mL | Freq: Once | INTRAVENOUS | Status: AC
  Administered 2019-04-20: 10 mL via INTRAVENOUS
  Filled 2019-04-20: qty 10

## 2019-04-20 MED ORDER — PALONOSETRON HCL INJECTION 0.25 MG/5ML
0.2500 mg | Freq: Once | INTRAVENOUS | Status: AC
  Administered 2019-04-20: 0.25 mg via INTRAVENOUS
  Filled 2019-04-20: qty 5

## 2019-04-20 MED ORDER — HEPARIN SOD (PORK) LOCK FLUSH 100 UNIT/ML IV SOLN
500.0000 [IU] | Freq: Once | INTRAVENOUS | Status: AC | PRN
  Administered 2019-04-20: 500 [IU]
  Filled 2019-04-20: qty 5

## 2019-04-20 MED ORDER — SODIUM CHLORIDE 0.9 % IV SOLN
Freq: Once | INTRAVENOUS | Status: AC
  Filled 2019-04-20: qty 250

## 2019-04-20 MED ORDER — SODIUM CHLORIDE 0.9 % IV SOLN
10.0000 mg | Freq: Once | INTRAVENOUS | Status: AC
  Administered 2019-04-20: 10 mg via INTRAVENOUS
  Filled 2019-04-20: qty 1

## 2019-04-20 NOTE — Progress Notes (Signed)
Patient stated that she currently have a sore throat and it makes it hard to eat and swallow her food.

## 2019-04-21 ENCOUNTER — Ambulatory Visit
Admission: RE | Admit: 2019-04-21 | Discharge: 2019-04-21 | Disposition: A | Discharge status: Home / Self Care | Payer: Medicaid Other | Source: Ambulatory Visit | Attending: Radiation Oncology | Admitting: Radiation Oncology

## 2019-04-21 ENCOUNTER — Other Ambulatory Visit: Payer: Self-pay | Admitting: *Deleted

## 2019-04-21 DIAGNOSIS — Z510 Encounter for antineoplastic radiation therapy: Secondary | ICD-10-CM | POA: Diagnosis not present

## 2019-04-21 MED ORDER — MAGIC MOUTHWASH W/LIDOCAINE: 5.0000 mL | Freq: Four times a day (QID) | ORAL | 1 refills | Status: DC | PRN

## 2019-04-22 ENCOUNTER — Other Ambulatory Visit: Payer: Self-pay

## 2019-04-22 ENCOUNTER — Ambulatory Visit
Admission: RE | Admit: 2019-04-22 | Discharge: 2019-04-22 | Disposition: A | Discharge status: Home / Self Care | Payer: Medicaid Other | Source: Ambulatory Visit | Attending: Radiation Oncology | Admitting: Radiation Oncology

## 2019-04-22 ENCOUNTER — Inpatient Hospital Stay: Payer: Medicaid Other

## 2019-04-22 DIAGNOSIS — Z510 Encounter for antineoplastic radiation therapy: Secondary | ICD-10-CM | POA: Diagnosis not present

## 2019-04-22 NOTE — Progress Notes (Signed)
Nutrition Follow-up: Patient with base of the tongue cancer.  Patient receiving radiation and chemotherapy.  Followed by Dr. Rao.  Met with patient following radiation.  Patient reports that as the day progresses she has soreness in her throat and on swallowing.  She has been using carafate, magic mouthwash was not at her pharmacy.  Reports that she has started drinking ensure high protein shakes.  Yesterday was able to eat egg sandwich, ensure shakes and mashed potatoes with gravy.     Medications: reviewed  Labs: reviewed  Anthropometrics:   Weight 180 lb 1 oz on 3/17 per Aria  180 lb 6 oz on 3/9 per Aria 182 lb on 2/23 02/22/19 184 lb   NUTRITION DIAGNOSIS: Predicted sub optimal energy intake continues   INTERVENTION:  Discussed oral nutrition supplements with patient and provided samples, coupons and recipes.  Encouraged patient to reach out to Dr. Rao's team regarding magic mouthwash Patient has contact information    MONITORING, EVALUATION, GOAL: Patient will consume adequate calories and protein during treatment to maintain weight   NEXT VISIT: March 25 after radiation  Joli B. Allen, RD, LDN Registered Dietitian [phone] (pager)     

## 2019-04-22 NOTE — Progress Notes (Signed)
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 Mike Gip.  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  Size of the neck node was greater than 3 cm and therefore concurrent chemoradiation was recommended.  Plan is to give her 3 doses of cisplatin 100 mg per metered squared IV every 3 weeks  After cycle 1 of treatment patient was hospitalized for right-sided neck and upper chest wall cellulitis unrelated to port infection and received IV antibiotics  Interval history-patient has completed oral antibiotics.  There is still some redness in that area but patient reports no fever.  Denies any significant pain in that area.  Patient does report difficulty swallowing  ECOG PS- 0 Pain scale- 0   Review of systems- Review of Systems  Constitutional: Positive for malaise/fatigue.  Gastrointestinal:       Difficulty swallowing       No Known Allergies   Past Medical History:  Diagnosis Date  . Throat cancer ([place]) 2021     Past Surgical History:  Procedure Laterality Date  . EXCISION MASS NECK Left 01/18/2019   Procedure: EXCISION MASS NECK/NODE;  Surgeon: Margaretha Sheffield, MD;  Location: [facility] ORS;  Service: ENT;  Laterality: Left;  . LARYNGOSCOPY Bilateral 02/22/2019   Procedure: MICROSCOPIC DIRECT LARYNGOSCOPY AND BIOPSY;  Surgeon: Margaretha Sheffield, MD;  Location: [facility] ORS;  Service: ENT;  Laterality: Bilateral;  .  PORTA CATH INSERTION N/A 03/24/2019   Procedure: PORTA CATH INSERTION;  Surgeon: Katha Cabal, MD;  Location: [facility];  Service: Cardiovascular;  Laterality: N/A;  . TONSILLECTOMY    . TUBAL LIGATION      Social History   Socioeconomic History  . Marital status: Single    Spouse name: Not on file  . Number of children: Not on file  . Years of education: Not on file  . Highest education level: Not on file  Occupational History  . Not on file  Tobacco Use  . Smoking status: Never Smoker  . Smokeless tobacco: Never Used  Substance and Sexual Activity  . Alcohol use: Not Currently  . Drug use: No  . Sexual activity: Not on file  Other Topics Concern  . Not on file  Social History Narrative  . Not on file   Social Determinants of Health   Financial Resource Strain:   . Difficulty of Paying Living Expenses:   Food Insecurity:   . Worried About Charity fundraiser in the Last Year:   . Arboriculturist in the Last Year:   Transportation Needs:   . Film/video editor (Medical):   Marland Kitchen Lack of Transportation (Non-Medical):   Physical Activity:   . Days of Exercise per Week:   . Minutes of Exercise per Session:   Stress:   . Feeling of Stress :   Social Connections:   . Frequency of Communication with Friends and Family:   . Frequency of Social Gatherings with Friends and Family:   .  Attends Religious Services:   . Active Member of Clubs or Organizations:   . Attends Archivist Meetings:   Marland Kitchen Marital Status:   Intimate Partner Violence:   . Fear of Current or Ex-Partner:   . Emotionally Abused:   Marland Kitchen Physically Abused:   . Sexually Abused:     Family History  Problem Relation Age of Onset  . Breast cancer Sister   . Brain cancer Paternal Grandfather      Current Outpatient Medications:  .  dexamethasone (DECADRON) 4 MG tablet, Take 2 tablets by mouth once a day on the day after chemotherapy and then take 2 tablets two times a day for 2  days. Take with food., Disp: 30 tablet, Rfl: 1 .  lidocaine-prilocaine (EMLA) cream, Apply to affected area once, Disp: 30 g, Rfl: 3 .  LORazepam (ATIVAN) 0.5 MG tablet, Take 1 tablet (0.5 mg total) by mouth every 6 (six) hours as needed (Nausea or vomiting)., Disp: 30 tablet, Rfl: 0 .  ondansetron (ZOFRAN) 8 MG tablet, Take 1 tablet (8 mg total) by mouth 2 (two) times daily as needed. Start on the third day after chemotherapy., Disp: 30 tablet, Rfl: 1 .  sucralfate (CARAFATE) 1 g tablet, Take 1 tablet (1 g total) by mouth 3 (three) times daily before meals. Dissolve tablet in warm water, swish and swallow., Disp: 90 tablet, Rfl: 6 .  magic mouthwash w/lidocaine SOLN, Take 5 mLs by mouth 4 (four) times daily as needed for mouth pain., Disp: 240 mL, Rfl: 1  Physical exam:  Vitals:   04/20/19 0905  BP: 138/89  Pulse: 77  Temp: 97.7 F (36.5 C)  TempSrc: Tympanic  Weight: 179 lb (81.2 kg)  Height: 5\' 3"  (1.6 m)   Physical Exam Constitutional:      General: She is not in acute distress. HENT:     Head: Normocephalic and atraumatic.     Mouth/Throat:     Mouth: Mucous membranes are moist.     Pharynx: Oropharynx is clear.  Eyes:     Pupils: Pupils are equal, round, and reactive to light.  Cardiovascular:     Rate and Rhythm: Normal rate and regular rhythm.     Heart sounds: Normal heart sounds.  Pulmonary:     Effort: Pulmonary effort is normal.     Breath sounds: Normal breath sounds.  Abdominal:     General: Bowel sounds are normal.     Palpations: Abdomen is soft.  Musculoskeletal:     Cervical back: Normal range of motion.     Comments: There is local warmth mild erythema and induration around the right shoulder area from recent cellulitis.  Lymphadenopathy:     Comments: No palpable cervical adenopathy  Skin:    General: Skin is warm and dry.  Neurological:     Mental Status: She is alert and oriented to person, place, and time.      CMP Latest Ref Rng & Units  04/20/2019  Glucose 70 - 99 mg/dL 129(H)  BUN 6 - 20 mg/dL 13  Creatinine 0.44 - 1.00 mg/dL 0.60  Sodium 135 - 145 mmol/L 139  Potassium 3.5 - 5.1 mmol/L 3.9  Chloride 98 - 111 mmol/L 105  CO2 22 - 32 mmol/L 25  Calcium 8.9 - 10.3 mg/dL 9.2  Total Protein 6.5 - 8.1 g/dL -  Total Bilirubin 0.3 - 1.2 mg/dL -  Alkaline Phos 38 - 126 U/L -  AST 15 - 41 U/L -  ALT 0 -  44 U/L -   CBC Latest Ref Rng & Units 04/20/2019  WBC 4.0 - 10.5 K/uL 4.0  Hemoglobin 12.0 - 15.0 g/dL 11.0(L)  Hematocrit 36.0 - 46.0 % 33.8(L)  Platelets 150 - 400 K/uL 311    No images are attached to the encounter.  CT CHEST W CONTRAST  Result Date: 04/02/2019 CLINICAL DATA:  Right upper chest pain and swelling, elevated white blood cell count, history of head neck cancer EXAM: CT CHEST WITH CONTRAST TECHNIQUE: Multidetector CT imaging of the chest was performed during intravenous contrast administration. CONTRAST:  27mL OMNIPAQUE IOHEXOL 300 MG/ML  SOLN COMPARISON:  02/04/2019 FINDINGS: Cardiovascular: For heart is unremarkable without pericardial effusion. Mild atherosclerosis within the LAD distribution of the coronary vasculature. Aorta is normal in caliber with no evidence of dissection. While not optimized for the evaluation of the pulmonary vasculature, there is sufficient contrast enhancement and visualization to exclude proximal and segmental pulmonary emboli. Mediastinum/Nodes: There is a right-sided chest wall port via internal jugular approach, tip within the superior vena cava. No pathologic mediastinal, hilar, or axillary adenopathy. Lungs/Pleura: No airspace disease, effusion, or pneumothorax. 5 mm subpleural left lower lobe pulmonary nodule seen on recent PET scan again identified, reference image 71 of series 2. Central airways are patent. Upper Abdomen: No acute abnormality. Musculoskeletal: There is subcutaneous edema within the superior posterior right shoulder, overlying the trapezius musculature. The muscle  itself appears unremarkable with no intramuscular edema or abnormal enhancement. This is a nonspecific finding and could reflect cellulitis or posttraumatic change. Please correlate with direct visual inspection. The distal right subclavian and axillary veins are diminutive, may reflect chronic thrombosis. There are no acute or destructive bony lesions. Prior healed left rib fractures are noted. Reconstructed images demonstrate no additional findings. IMPRESSION: 1. Subcutaneous fat stranding posterior right shoulder, which may reflect cellulitis or posttraumatic change. Please correlate with direct visual inspection. 2. Stable 5 mm left lower lobe pulmonary nodule, nonspecific. Routine follow up recommended per protocol for patient's known head and neck cancer. 3. 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 4. Otherwise no acute intrathoracic process. Electronically Signed   By: Randa Ngo M.D.   On: 04/02/2019 19:58   PERIPHERAL VASCULAR CATHETERIZATION  Result Date: 03/24/2019 See op note    Assessment and plan- Patient is a 59 y.o. 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  She is here for on treatment assessment prior to cycle 2 of cisplatin chemotherapy  Counts okay to proceed with cycle 2 of cisplatin chemotherapy today.  Her ANC is normal at 2.6 but lower as compared to before.  She cannot receive Neulasta with concurrent radiation.  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  No overt mucositis.  I have given her Magic mouthwash to swish and swallow.  If she continues to have pain opioid pain medications may need to be added.  Right neck cellulitis.  I did not see the patient when she was in the hospital but patient reports the erythema is much better although it still persists.  No present fever.  Port site appears well.  She has completed her course of oral antibiotics.   I will see her back in 3 weeks for cycle three of cisplatin which would be her last dose.  Her interim care including possible need for IV fluids will be coordinated by Dr. Kem Parkinson team    Visit Diagnosis 1. Encounter  for antineoplastic chemotherapy   2. Carcinoma of base of tongue ([place])   3. Radiation esophagitis      Dr. Randa Evens, MD, MPH [facility] [id number] 04/22/2019 8:16 AM

## 2019-04-23 ENCOUNTER — Ambulatory Visit
Admission: RE | Admit: 2019-04-23 | Discharge: 2019-04-23 | Disposition: A | Discharge status: Home / Self Care | Payer: Medicaid Other | Source: Ambulatory Visit | Attending: Radiation Oncology | Admitting: Radiation Oncology

## 2019-04-23 DIAGNOSIS — Z510 Encounter for antineoplastic radiation therapy: Secondary | ICD-10-CM | POA: Diagnosis not present

## 2019-04-26 ENCOUNTER — Ambulatory Visit
Admission: RE | Admit: 2019-04-26 | Discharge: 2019-04-26 | Disposition: A | Discharge status: Home / Self Care | Payer: Medicaid Other | Source: Ambulatory Visit | Attending: Radiation Oncology | Admitting: Radiation Oncology

## 2019-04-26 DIAGNOSIS — Z510 Encounter for antineoplastic radiation therapy: Secondary | ICD-10-CM | POA: Diagnosis not present

## 2019-04-27 ENCOUNTER — Other Ambulatory Visit: Payer: Self-pay

## 2019-04-27 ENCOUNTER — Ambulatory Visit
Admission: RE | Admit: 2019-04-27 | Discharge: 2019-04-27 | Disposition: A | Discharge status: Home / Self Care | Payer: Medicaid Other | Source: Ambulatory Visit | Attending: Radiation Oncology | Admitting: Radiation Oncology

## 2019-04-27 DIAGNOSIS — Z510 Encounter for antineoplastic radiation therapy: Secondary | ICD-10-CM | POA: Diagnosis not present

## 2019-04-27 NOTE — Progress Notes (Signed)
[facility]  [address], [address] [place], [place] [address] Phone: [phone]  Fax: [phone]   Clinic Day:  04/28/2019  Referring physician: No ref. provider found  Chief Complaint: Connie Cameron is a 59 y.o. 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   HPI:  The patient was last seen in the medical oncology clinic on 03/23/2019. At that time, she felt good.  Exam revealed no palpable adenopathy. We discussed plans for concurrent cisplatin and radiation.  As radiation was daily at [facility] and chemotherapy > 6 hours, decision was made for treatment in [place].  She has been seen by Dr Janese Banks.  Radiation began 03/30/2019.  She has received 3800 cGy (19 of 35 fractions) to her head and neck as of 04/27/2019.  Plan is for 7000 cGy to the base of the tongue and area of surgical lymph node excision.  In addition, Dr Baruch Gouty plans for 5400 cGy bilaterally to the remainder of the neck.  Radiation is scheduled to complete on 05/19/2019.   She has received 2 cycles of cisplatin (03/30/2019 and 04/20/2019).  She was seen by Dr Janese Banks on 04/20/2019.  At that time, right neck erythema had improved.  She reported difficulty swallowing but no overt mucositis.  She received an Rx for Magic mouthwash.  Labs included a hematocrit 33.8, hemoglobin 11.0, MCV 90.1, platelets 311,000, and WBC 4,000 (ANC 2,600).  Magnesium was 2.2  She was admitted to [facility] from 04/02/2019 - 04/06/2019 with cellulitis involving the trapezius and right supraclavicular area.  Port-a-cath was uninvolved.  She received cefepime and vancomycin.  She was discharged on doxycycline.  Patient has been experiencing a sore throat x 2 weeks. Magic Mouthwash is providing relief.   She complains of constant tinnitus that began the day after she received cycle #2 cisplatin. She is due to receive her last  treatment on 05/11/2019. She says she has little issues with nausea. She says she is mostly irritated from the radiation. She has lost 8 lb since her first treatment. She states she eats soft things only. She sees her nutritionist every Thursday. She says she had a fever when she was admitted to the hospital but has had no fever since discahrge. She says she is now less anxious than when she was before treatment.    Past Medical History:  Diagnosis Date  . Throat cancer ([place]) 2021    Past Surgical History:  Procedure Laterality Date  . EXCISION MASS NECK Left 01/18/2019   Procedure: EXCISION MASS NECK/NODE;  Surgeon: Margaretha Sheffield, MD;  Location: [facility] ORS;  Service: ENT;  Laterality: Left;  . LARYNGOSCOPY Bilateral 02/22/2019   Procedure: MICROSCOPIC DIRECT LARYNGOSCOPY AND BIOPSY;  Surgeon: Margaretha Sheffield, MD;  Location: [facility] ORS;  Service: ENT;  Laterality: Bilateral;  . PORTA CATH INSERTION N/A 03/24/2019   Procedure: PORTA CATH INSERTION;  Surgeon: Katha Cabal, MD;  Location: [place] [facility];  Service: Cardiovascular;  Laterality: N/A;  . TONSILLECTOMY    . TUBAL LIGATION      Family History  Problem Relation Age of Onset  . Breast cancer Sister   . Brain cancer Paternal Grandfather     Social History:  reports that she has never smoked. She has never used smokeless tobacco. She reports previous alcohol use. She reports that she does not use drugs. She use to drink years ago.  She denies any exposure to radiation or toxins. 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 She lives in [place]. The patient is alone  today.   Allergies: No Known Allergies  Current Medications: Current Outpatient Medications  Medication Sig Dispense Refill  . dexamethasone (DECADRON) 4 MG tablet Take 2 tablets by mouth once a day on the day after chemotherapy and then take 2 tablets two times a day for 2 days. Take with food. 30 tablet 1  . lidocaine-prilocaine  (EMLA) cream Apply to affected area once 30 g 3  . LORazepam (ATIVAN) 0.5 MG tablet Take 1 tablet (0.5 mg total) by mouth every 6 (six) hours as needed (Nausea or vomiting). 30 tablet 0  . magic mouthwash w/lidocaine SOLN Take 5 mLs by mouth 4 (four) times daily as needed for mouth pain. 240 mL 1  . ondansetron (ZOFRAN) 8 MG tablet Take 1 tablet (8 mg total) by mouth 2 (two) times daily as needed. Start on the third day after chemotherapy. 30 tablet 1  . sucralfate (CARAFATE) 1 g tablet Take 1 tablet (1 g total) by mouth 3 (three) times daily before meals. Dissolve tablet in warm water, swish and swallow. 90 tablet 6  . Morphine Sulfate (MORPHINE CONCENTRATE) 10 mg / 0.5 ml concentrated solution Take 0.5 mLs (10 mg total) by mouth every 4 (four) hours as needed for severe pain. 30 mL 0   No current facility-administered medications for this visit.    Review of Systems  Constitutional: Positive for weight loss (down 8 lbs since first treatment ). Negative for chills, diaphoresis, fever and malaise/fatigue.       Feels "good".  HENT: Positive for hearing loss (high frequency hearing loss left ear) and tinnitus (began on day 2 of cycle #2 cisplatin). Negative for congestion, ear discharge, ear pain, nosebleeds, sinus pain and sore throat.   Eyes: Negative for blurred vision (stable), double vision and photophobia.  Respiratory: Negative.  Negative for cough, hemoptysis, sputum production and shortness of breath.   Cardiovascular: Negative.  Negative for chest pain, palpitations and leg swelling.  Gastrointestinal: Negative.  Negative for abdominal pain, blood in stool, constipation, diarrhea, heartburn, melena, nausea and vomiting.  Genitourinary: Negative.  Negative for dysuria, frequency, hematuria and urgency.  Musculoskeletal: Negative.  Negative for back pain, joint pain, myalgias and neck pain.  Skin: Negative.  Negative for itching and rash.  Neurological: Negative.  Negative for dizziness,  tingling, tremors, sensory change, speech change, focal weakness, weakness and headaches.  Endo/Heme/Allergies: Negative.  Does not bruise/bleed easily.  Psychiatric/Behavioral: Negative for depression and memory loss. The patient is nervous/anxious (improved) and has insomnia.   All other systems reviewed and are negative.   Performance status (ECOG):  1   Vitals Blood pressure 139/86, pulse 84, temperature 98.2 F (36.8 C), temperature source Tympanic, resp. rate 18, weight 174 lb 2.6 oz (79 kg), SpO2 98 %.   Physical Exam  Constitutional: She is oriented to person, place, and time. She appears well-developed and well-nourished. No distress.  HENT:  Head: Normocephalic.  Mouth/Throat: Mucous membranes are dry. No oropharyngeal exudate.  Long brown/blonde hair.  No mucositis.  Mask.  Eyes: Pupils are equal, round, and reactive to light. Conjunctivae and EOM are normal. No scleral icterus.  Blue eyes.  Cardiovascular: Normal rate, regular rhythm and normal heart sounds.  No murmur heard. Pulmonary/Chest: Effort normal and breath sounds normal. No respiratory distress. She has no wheezes. She has  no rales. She exhibits no tenderness.  Abdominal: Soft. Bowel sounds are normal. She exhibits no distension and no mass. There is no abdominal tenderness. There is no rebound and no guarding.  Musculoskeletal:        General: No tenderness or edema. Normal range of motion.     Cervical back: Normal range of motion and neck supple.  Lymphadenopathy:       Head (right side): No preauricular, no posterior auricular and no occipital adenopathy present.       Head (left side): No preauricular and no posterior auricular adenopathy present.    She has no cervical adenopathy.    She has no axillary adenopathy.       Right: No inguinal and no supraclavicular adenopathy present.       Left: No inguinal and no supraclavicular adenopathy present.  Neurological: She is alert and oriented to person, place,  and time.  Skin: Skin is warm and dry. No rash noted. She is not diaphoretic. No erythema. No pallor.  Fullness overlying right scapula without mass, erythema, or induration.  Psychiatric: She has a normal mood and affect. Her behavior is normal. Judgment and thought content normal.  Nursing note and vitals reviewed.   No visits with results within 3 Day(s) from this visit.  Latest known visit with results is:  Admission on 02/22/2019, Discharged on 02/22/2019  Component Date Value Ref Range Status  . SURGICAL PATHOLOGY 02/22/2019    Final-Edited                   Value:SURGICAL PATHOLOGY CASE: [id number] PATIENT: Ady Commins Surgical Pathology Report  Specimen Submitted: A. Tongue, left base  Clinical History: Malignant neoplasm base of tongue   DIAGNOSIS: A. TONGUE, LEFT BASE; BIOPSY: - DIAGNOSTIC OF MALIGNANCY. - POORLY DIFFERENTIATED SQUAMOUS CELL CARCINOMA; SEE COMMENT.  Comment: The morphologic features are identical those seen in the prior excisional biopsy of left neck mass [id number]).  Immunohistochemical stains were performed on block A1.  The malignant cells are positive for p16, p40, and pancytokeratin.  IHC slides were prepared by [facility], [place], [place]. All controls stained appropriately.  This test was developed and its performance characteristics determined by [facility]. It has not been cleared or approved by the [place] Food and Drug Administration. The FDA does not require this test to go through premarket FDA review. This test is used for clinical purposes                         . It should not be regarded as investigational or for research. This laboratory is certified under the Clinical Laboratory Improvement Amendments (CLIA) as qualified to perform high complexity clinical laboratory testing.  GROSS DESCRIPTION: A. Labeled: Left base of the tongue Received: In formalin Tissue fragment(s): Multiple Size:  Aggregate, 1.5 x 1.3 x 0.2 cm Description: Unoriented pink rubbery soft tissue fragments Entirely submitted in 1 cassette.  Final Diagnosis performed by Betsy Pries, MD.   Electronically signed 02/25/2019 4:33:12PM The electronic signature indicates that the named Attending Pathologist has evaluated the specimen Technical component performed at [facility], [address], [place], [place] [address] Lab: [phone] Dir: Rush Farmer, MD, MMM  Professional component performed at [facility], [facility], [place], [place], [place] [address] Lab: [phone] Dir: Dellia Nims. Reuel Derby, MD    Assessment:  Connie Cameron is a 59 y.o. female with clinical stage T1N1M0 left base of tongue  poorly differentiated squamous cell carcinoma s/p biopsy on 02/22/2019.  Pathology revealed poorly differentiated squamous cell carcinoma.  Malignant cells are positive for p16, p40, and pancytokeratin.   She presented with left cervical adenopathy. 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  Node was 4.0 x 2.3 x 1.8 cm.  Neck CT on 12/21/2018 revealed a 2.4 x 2.1 x 3.4 cm lymph node in the left upper neck. The lymph node showed heterogeneous enhancement and stranding in the surrounding fat. There was a 6 mm left upper posterior lymph node and a 7 mm right level 3 lymph node. Imaging was concerning for malignancy. There were no other enlarged lymph nodes. There was no pharyngeal mass.   PET scan on 02/04/2019 revealed prior left level II cervical node had been excised. There were no findings on PET to suggest a site of primary head/neck cancer. There was symmetric hypermetabolism along the base of the tongue bilaterally favored to be physiologic. There was no evidence of metastatic disease.   Audiogram on 03/11/2019 revealed normal hearing in the right ear through 8 kHz with mild  hearing loss at 12 kHz only.  Hearing was within normal limits in the left ear through 3 kHz with mild-severe hearing loss from 4-12 kHz.  Recommendation was repeat retesting with future treatments  Radiation began 03/30/2019.  She is day 9 s/p cycle #2 cisplatin (03/30/2019 - 04/20/2019).  She was admitted to [facility] from 04/02/2019 - 04/06/2019 with cellulitis involving the trapezius and right supraclavicular area.  Port-a-cath was uninvolved.  She received cefepime and vancomycin.  She was discharged on doxycycline.  Symptomatically, she has a sore throat secondary to radiation.  Discomfort is managed with Magic mouthwash.  She has had tinnitus since cycle #2 cisplatin.  She denies any nausea.  Exam is unremarkable.  Plan: 1.   Review interim labs. 2.   Stage I left base of tongue carcinoma  Clinically, she is doing well.  She is currently day 9 s/p cycle #2 cisplatin.   She has a sore throat secondary to radiation.    Exam reveals no oral lesions.    Continue Magic mouthwash.    Patient to contact clinic is pain progresses and oral pain medications needed.   She has developed significant tinnitus s/p cycle #2 cisplatin.    Discuss plans for repeat audiogram.   Patient denies any nausea or vomiting.  Discuss plan for cycle #3 cisplatin on 05/11/2019.   Plans may need to be adjusted if significant tinnitus and/or hearing has occurred.  Continue radiation (due to complete on 05/19/2019). 2.   Audiogram on 05/07/2019. 3.   RTC on 05/11/2019 in [place] for MD assessment (Dr Janese Banks) and consideration of cycle #3 cisplatin 4.   RTC on 05/20/2019 for MD assessment in [place], labs (CBC with diff, BMP, Mg), and +/- IVF.  AddendumJudi Saa on 04/30/2019 revealed significant decrease in high-frequency hearing.  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  I discussed the assessment and treatment plan with the patient.  The patient was provided an opportunity  to ask questions and all were answered.  The patient agreed with the plan and demonstrated an understanding of the instructions.  The patient was advised to call back if the symptoms worsen or if the condition fails to improve as anticipated.   Lequita Asal, MD, PhD    04/28/2019, 7:19 PM  I, [employer], am acting as a Education administrator for [employer]. Mike Gip, MD.   I, Andi Mahaffy C. Mike Gip, MD, have reviewed the above documentation for accuracy and completeness, and I agree with the above.

## 2019-04-27 NOTE — Progress Notes (Signed)
Confirmed Name and DOB. Patient reports she is experiencing soreness in throat x 2 weeks. Magic Mouthwash is providing some relief. Patient also reports she is having ringing in ears x 1 week after receiving second round of cisplatin. Denies any other concerns.

## 2019-04-28 ENCOUNTER — Encounter: Payer: Self-pay | Admitting: Hematology and Oncology

## 2019-04-28 ENCOUNTER — Inpatient Hospital Stay (HOSPITAL_BASED_OUTPATIENT_CLINIC_OR_DEPARTMENT_OTHER): Payer: Medicaid Other | Admitting: Hematology and Oncology

## 2019-04-28 ENCOUNTER — Telehealth: Payer: Self-pay

## 2019-04-28 ENCOUNTER — Ambulatory Visit
Admission: RE | Admit: 2019-04-28 | Discharge: 2019-04-28 | Disposition: A | Discharge status: Home / Self Care | Payer: Medicaid Other | Source: Ambulatory Visit | Attending: Radiation Oncology | Admitting: Radiation Oncology

## 2019-04-28 VITALS — BP 139/86 | HR 84 | Temp 98.2°F | Resp 18 | Wt 174.2 lb

## 2019-04-28 DIAGNOSIS — Z510 Encounter for antineoplastic radiation therapy: Secondary | ICD-10-CM | POA: Diagnosis not present

## 2019-04-28 DIAGNOSIS — C01 Malignant neoplasm of base of tongue: Secondary | ICD-10-CM

## 2019-04-28 DIAGNOSIS — H9313 Tinnitus, bilateral: Secondary | ICD-10-CM

## 2019-04-28 DIAGNOSIS — Z5111 Encounter for antineoplastic chemotherapy: Secondary | ICD-10-CM | POA: Diagnosis not present

## 2019-04-28 NOTE — Telephone Encounter (Signed)
Left a message To inform the patient i was able to get her schedule for [place] Ears, nose and throat was able to get the patient schedule for Friday 04/30/2019 at 1:30 pm.

## 2019-04-29 ENCOUNTER — Inpatient Hospital Stay: Payer: Medicaid Other

## 2019-04-29 ENCOUNTER — Ambulatory Visit
Admission: RE | Admit: 2019-04-29 | Discharge: 2019-04-29 | Disposition: A | Discharge status: Home / Self Care | Payer: Medicaid Other | Source: Ambulatory Visit | Attending: Radiation Oncology | Admitting: Radiation Oncology

## 2019-04-29 DIAGNOSIS — Z510 Encounter for antineoplastic radiation therapy: Secondary | ICD-10-CM | POA: Diagnosis not present

## 2019-04-29 NOTE — Progress Notes (Signed)
Nutrition Follow-up:  Patient with base of the tongue cancer.  Patient receiving radiation and chemotherapy.  Followed by Dr. Colbert Coyer.  Met with patient following radiation this am.  Patient reports appetite is so-so.  She does not get hungry to eat and went most of the day yesterday without having anything.  Ate 1/2 egg in am then did not eat again until 5:30 and made smoothie with ensure, yogurt and fruit.  Reports after drinking it she felt better and realized that why she felt bad was because she had not eaten.  Reports magic mouthwash is helping some.  Likes all the oral nutrition supplements that was provided last visit, drinking about 2 per day.     Medications: reviewed  Labs: reviewed  Anthropometrics:   Weight 173 lb 8 oz per [facility] on 3/23  180 lb 6 oz on 3/9 per [facility] 184 lb on 02/22/19  5% weight loss in the last 2 months, concerning   NUTRITION DIAGNOSIS: Predicted suboptimal energy intake continues   INTERVENTION:  Provided patient with complimentary case of ensure enlive today in clinic. Encouraged oral nutrition supplement TID if able.  Reviewed soft moist high calorie, high protein foods.  Discussed setting schedule to eat as body is not signaling hunger.   Patient has contact information    MONITORING, EVALUATION, GOAL: Patient will consume adequate calories and protein during treatment to maintain weight   NEXT VISIT: Thursday, April 1 after radiation  Dong Nimmons B. Zenia Resides, [place], [place] Registered Dietitian [phone] (pager)

## 2019-04-30 ENCOUNTER — Ambulatory Visit
Admission: RE | Admit: 2019-04-30 | Discharge: 2019-04-30 | Disposition: A | Discharge status: Home / Self Care | Payer: Medicaid Other | Source: Ambulatory Visit | Attending: Radiation Oncology | Admitting: Radiation Oncology

## 2019-04-30 ENCOUNTER — Encounter: Payer: Self-pay | Admitting: Otolaryngology

## 2019-05-02 ENCOUNTER — Inpatient Hospital Stay
Admission: EM | Admit: 2019-05-02 | Discharge: 2019-05-05 | DRG: 202 | Disposition: A | Discharge status: Home / Self Care | Payer: Medicaid Other | Attending: Internal Medicine | Admitting: Internal Medicine

## 2019-05-02 ENCOUNTER — Other Ambulatory Visit: Payer: Self-pay | Admitting: Oncology

## 2019-05-02 ENCOUNTER — Emergency Department: Payer: Medicaid Other

## 2019-05-02 ENCOUNTER — Other Ambulatory Visit: Payer: Self-pay

## 2019-05-02 ENCOUNTER — Encounter: Payer: Self-pay | Admitting: Emergency Medicine

## 2019-05-02 ENCOUNTER — Ambulatory Visit: Payer: Medicaid Other

## 2019-05-02 DIAGNOSIS — Z803 Family history of malignant neoplasm of breast: Secondary | ICD-10-CM

## 2019-05-02 DIAGNOSIS — R509 Fever, unspecified: Secondary | ICD-10-CM

## 2019-05-02 DIAGNOSIS — D61810 Antineoplastic chemotherapy induced pancytopenia: Secondary | ICD-10-CM | POA: Diagnosis present

## 2019-05-02 DIAGNOSIS — H9313 Tinnitus, bilateral: Secondary | ICD-10-CM | POA: Insufficient documentation

## 2019-05-02 DIAGNOSIS — R1310 Dysphagia, unspecified: Secondary | ICD-10-CM

## 2019-05-02 DIAGNOSIS — H905 Unspecified sensorineural hearing loss: Secondary | ICD-10-CM | POA: Diagnosis present

## 2019-05-02 DIAGNOSIS — K219 Gastro-esophageal reflux disease without esophagitis: Secondary | ICD-10-CM

## 2019-05-02 DIAGNOSIS — F419 Anxiety disorder, unspecified: Secondary | ICD-10-CM | POA: Diagnosis present

## 2019-05-02 DIAGNOSIS — R34 Anuria and oliguria: Secondary | ICD-10-CM | POA: Diagnosis not present

## 2019-05-02 DIAGNOSIS — Z789 Other specified health status: Secondary | ICD-10-CM

## 2019-05-02 DIAGNOSIS — J029 Acute pharyngitis, unspecified: Secondary | ICD-10-CM | POA: Diagnosis present

## 2019-05-02 DIAGNOSIS — C01 Malignant neoplasm of base of tongue: Secondary | ICD-10-CM | POA: Diagnosis present

## 2019-05-02 DIAGNOSIS — Z808 Family history of malignant neoplasm of other organs or systems: Secondary | ICD-10-CM

## 2019-05-02 DIAGNOSIS — C140 Malignant neoplasm of pharynx, unspecified: Secondary | ICD-10-CM

## 2019-05-02 DIAGNOSIS — J40 Bronchitis, not specified as acute or chronic: Secondary | ICD-10-CM | POA: Diagnosis present

## 2019-05-02 LAB — COMPREHENSIVE METABOLIC PANEL
ALT: 37 U/L (ref 0–44)
AST: 32 U/L (ref 15–41)
Albumin: 4.2 g/dL (ref 3.5–5.0)
Alkaline Phosphatase: 62 U/L (ref 38–126)
Anion gap: 10 (ref 5–15)
BUN: 16 mg/dL (ref 6–20)
CO2: 23 mmol/L (ref 22–32)
Calcium: 9.2 mg/dL (ref 8.9–10.3)
Chloride: 104 mmol/L (ref 98–111)
Creatinine, Ser: 0.72 mg/dL (ref 0.44–1.00)
GFR calc Af Amer: >60 mL/min (ref >60)
GFR calc non Af Amer: >60 mL/min (ref >60)
Glucose, Bld: 109 mg/dL — ABNORMAL HIGH (ref 70–99)
Potassium: 4 mmol/L (ref 3.5–5.1)
Sodium: 137 mmol/L (ref 135–145)
Total Bilirubin: 0.9 mg/dL (ref 0.3–1.2)
Total Protein: 7.1 g/dL (ref 6.5–8.1)

## 2019-05-02 LAB — CBC WITH DIFFERENTIAL/PLATELET
Abs Immature Granulocytes: 0.01 10*3/uL (ref 0.00–0.07)
Basophils Absolute: 0 10*3/uL (ref 0.0–0.1)
Basophils Relative: 0 %
Eosinophils Absolute: 0 10*3/uL (ref 0.0–0.5)
Eosinophils Relative: 0 %
HCT: 31.1 % — ABNORMAL LOW (ref 36.0–46.0)
Hemoglobin: 10.2 g/dL — ABNORMAL LOW (ref 12.0–15.0)
Immature Granulocytes: 0 %
Lymphocytes Relative: 8 %
Lymphs Abs: 0.3 10*3/uL — ABNORMAL LOW (ref 0.7–4.0)
MCH: 29.8 pg (ref 26.0–34.0)
MCHC: 32.8 g/dL (ref 30.0–36.0)
MCV: 90.9 fL (ref 80.0–100.0)
Monocytes Absolute: 0.6 10*3/uL (ref 0.1–1.0)
Monocytes Relative: 14 %
Neutro Abs: 3.3 10*3/uL (ref 1.7–7.7)
Neutrophils Relative %: 78 %
Platelets: 126 10*3/uL — ABNORMAL LOW (ref 150–400)
RBC: 3.42 MIL/uL — ABNORMAL LOW (ref 3.87–5.11)
RDW: 12.9 % (ref 11.5–15.5)
WBC: 4.2 10*3/uL (ref 4.0–10.5)
nRBC: 0 % (ref 0.0–0.2)

## 2019-05-02 LAB — PROCALCITONIN: Procalcitonin: <0.1 ng/mL

## 2019-05-02 LAB — LACTIC ACID, PLASMA: Lactic Acid, Venous: 1.1 mmol/L (ref 0.5–1.9)

## 2019-05-02 MED ORDER — ACETAMINOPHEN 325 MG PO TABS: 650.0000 mg | ORAL_TABLET | Freq: Four times a day (QID) | ORAL | Status: DC | PRN

## 2019-05-02 MED ORDER — ENOXAPARIN SODIUM 40 MG/0.4ML ~~LOC~~ SOLN
40.0000 mg | SUBCUTANEOUS | Status: DC
  Administered 2019-05-03 – 2019-05-04 (×3): 40 mg via SUBCUTANEOUS
  Filled 2019-05-02 (×3): qty 0.4

## 2019-05-02 MED ORDER — TRAZODONE HCL 50 MG PO TABS: 25.0000 mg | ORAL_TABLET | Freq: Every evening | ORAL | Status: DC | PRN

## 2019-05-02 MED ORDER — VANCOMYCIN HCL IN DEXTROSE 1-5 GM/200ML-% IV SOLN: 1000.0000 mg | Freq: Once | INTRAVENOUS | Status: DC

## 2019-05-02 MED ORDER — SUCRALFATE 1 G PO TABS
1.0000 g | ORAL_TABLET | Freq: Three times a day (TID) | ORAL | Status: DC
  Administered 2019-05-04 – 2019-05-05 (×4): 1 g via ORAL
  Filled 2019-05-02 (×4): qty 1

## 2019-05-02 MED ORDER — ONDANSETRON HCL 4 MG PO TABS: 4.0000 mg | ORAL_TABLET | Freq: Four times a day (QID) | ORAL | Status: DC | PRN

## 2019-05-02 MED ORDER — METRONIDAZOLE IN NACL 5-0.79 MG/ML-% IV SOLN
500.0000 mg | Freq: Once | INTRAVENOUS | Status: AC
  Administered 2019-05-02: 500 mg via INTRAVENOUS
  Filled 2019-05-02: qty 100

## 2019-05-02 MED ORDER — KETOROLAC TROMETHAMINE 30 MG/ML IJ SOLN
15.0000 mg | Freq: Four times a day (QID) | INTRAMUSCULAR | Status: DC | PRN
  Administered 2019-05-03: 15 mg via INTRAVENOUS
  Filled 2019-05-02: qty 1

## 2019-05-02 MED ORDER — MAGNESIUM HYDROXIDE 400 MG/5ML PO SUSP: 30.0000 mL | Freq: Every day | ORAL | Status: DC | PRN

## 2019-05-02 MED ORDER — MAGIC MOUTHWASH
5.0000 mL | Freq: Four times a day (QID) | ORAL | Status: DC | PRN
  Filled 2019-05-02: qty 10

## 2019-05-02 MED ORDER — ONDANSETRON HCL 4 MG PO TABS: 8.0000 mg | ORAL_TABLET | Freq: Three times a day (TID) | ORAL | Status: DC | PRN

## 2019-05-02 MED ORDER — VANCOMYCIN HCL 1750 MG/350ML IV SOLN
1750.0000 mg | Freq: Once | INTRAVENOUS | Status: AC
  Administered 2019-05-02: 1750 mg via INTRAVENOUS
  Filled 2019-05-02: qty 350

## 2019-05-02 MED ORDER — METRONIDAZOLE IN NACL 5-0.79 MG/ML-% IV SOLN
500.0000 mg | Freq: Three times a day (TID) | INTRAVENOUS | Status: DC
  Administered 2019-05-03 – 2019-05-04 (×4): 500 mg via INTRAVENOUS
  Filled 2019-05-02 (×6): qty 100

## 2019-05-02 MED ORDER — LORAZEPAM 0.5 MG PO TABS: 0.5000 mg | ORAL_TABLET | Freq: Four times a day (QID) | ORAL | Status: DC | PRN

## 2019-05-02 MED ORDER — ONDANSETRON HCL 4 MG/2ML IJ SOLN
4.0000 mg | Freq: Four times a day (QID) | INTRAMUSCULAR | Status: DC | PRN
  Administered 2019-05-03 – 2019-05-04 (×3): 4 mg via INTRAVENOUS
  Filled 2019-05-02 (×3): qty 2

## 2019-05-02 MED ORDER — ONDANSETRON HCL 4 MG/2ML IJ SOLN
4.0000 mg | Freq: Once | INTRAMUSCULAR | Status: AC
  Administered 2019-05-02: 4 mg via INTRAVENOUS
  Filled 2019-05-02: qty 2

## 2019-05-02 MED ORDER — MORPHINE SULFATE (PF) 4 MG/ML IV SOLN
4.0000 mg | Freq: Once | INTRAVENOUS | Status: AC
  Administered 2019-05-02: 4 mg via INTRAVENOUS
  Filled 2019-05-02: qty 1

## 2019-05-02 MED ORDER — MAGIC MOUTHWASH: 5.0000 mL | Freq: Once | ORAL | Status: DC

## 2019-05-02 MED ORDER — SODIUM CHLORIDE 0.9 % IV SOLN
2.0000 g | Freq: Once | INTRAVENOUS | Status: AC
  Administered 2019-05-02: 2 g via INTRAVENOUS
  Filled 2019-05-02: qty 2

## 2019-05-02 MED ORDER — MORPHINE SULFATE (CONCENTRATE) 10 MG /0.5 ML PO SOLN: 10.0000 mg | ORAL | 0 refills | Status: DC | PRN

## 2019-05-02 MED ORDER — SODIUM CHLORIDE 0.9 % IV SOLN
2.0000 g | Freq: Three times a day (TID) | INTRAVENOUS | Status: DC
  Administered 2019-05-03 – 2019-05-04 (×4): 2 g via INTRAVENOUS
  Filled 2019-05-02 (×6): qty 2

## 2019-05-02 MED ORDER — SODIUM CHLORIDE 0.9 % IV SOLN: INTRAVENOUS | Status: DC

## 2019-05-02 MED ORDER — MORPHINE SULFATE (PF) 2 MG/ML IV SOLN
2.0000 mg | INTRAVENOUS | Status: DC | PRN
  Administered 2019-05-03 – 2019-05-04 (×8): 2 mg via INTRAVENOUS
  Filled 2019-05-02 (×8): qty 1

## 2019-05-02 MED ORDER — ACETAMINOPHEN 650 MG RE SUPP: 650.0000 mg | Freq: Four times a day (QID) | RECTAL | Status: DC | PRN

## 2019-05-02 MED ORDER — SODIUM CHLORIDE 0.9 % IV BOLUS
1000.0000 mL | Freq: Once | INTRAVENOUS | Status: AC
  Administered 2019-05-02: 1000 mL via INTRAVENOUS

## 2019-05-02 MED ORDER — MORPHINE SULFATE (CONCENTRATE) 10 MG/0.5ML PO SOLN
10.0000 mg | ORAL | Status: DC | PRN
  Administered 2019-05-03 – 2019-05-05 (×2): 10 mg via ORAL
  Filled 2019-05-02 (×2): qty 0.5

## 2019-05-02 NOTE — Progress Notes (Signed)
PHARMACY -  BRIEF ANTIBIOTIC NOTE   Pharmacy has received consult(s) for Cefepime, Vancomycin from an ED provider.  The patient's profile has been reviewed for ht/wt/allergies/indication/available labs.    One time order(s) placed for Vancomycin 1750 mg IV X 1 and Cefepime 2 gm IV X 1.   Further antibiotics/pharmacy consults should be ordered by admitting physician if indicated.                       Thank you, Taurean Ju D 05/02/2019  8:34 PM

## 2019-05-02 NOTE — ED Triage Notes (Signed)
Patient states that she has throat cancer and is actively receiving chemo and radiation. Patient states that her throat has been hurting more today. She says that her provider ordered her liquid morphine but that has not helped with the pain. Patient states that she has had poor po intake today due to the pain. Patient states that she just was not feeling good and that she had a temperature of 100.5 at home. Patient states that she was instructed to come to the ED by her oncologist.

## 2019-05-02 NOTE — Progress Notes (Signed)
CODE SEPSIS - PHARMACY COMMUNICATION  **Broad Spectrum Antibiotics should be administered within 1 hour of Sepsis diagnosis**  Time Code Sepsis Called/Page Received: 2252  Antibiotics Ordered: vanc/cefepime/flagyl  Time of 1st antibiotic administration: 2126  Additional action taken by pharmacy:   If necessary, Name of Provider/Nurse Contacted:     Tobie Lords ,PharmD Clinical Pharmacist  05/02/2019  10:57 PM

## 2019-05-02 NOTE — H&P (Addendum)
[facility] NAME: Connie Cameron    MR#:  [id number]  DATE OF BIRTH:  1960-02-09  DATE OF ADMISSION:  05/02/2019  PRIMARY CARE PHYSICIAN: Patient, No Pcp Per   REQUESTING/REFERRING PHYSICIAN: Derrell Lolling, MD CHIEF COMPLAINT:   Chief Complaint  Patient presents with  . Fever    HISTORY OF PRESENT ILLNESS:  Connie Cameron  is a 59 y.o. Caucasian female with a known history of throat cancer status post surgical resection, on daily radiotherapy and on chemotherapy since February, who presented to the emergency room with acute onset of fever of 100.5 today with associated sore throat that started on Saturday night with inability to drink fluids today secondary to pain.  She called her oncologist who recommended her come in to the ER.  She denies any chills.  No nausea or vomiting or abdominal pain.  No cough or wheezing.  No dysuria or hematuria or flank pain.  She admits to oliguria with decreased p.o. intake. Her last chemotherapy session was about a week ago.  Upon presentation to the emergency room, blood pressure was 158/94 with otherwise normal vital signs.  Labs revealed normal CMP and CBC showed anemia close to previous levels.  Lactic acid was 1.1 and procalcitonin was less than 0.1.  Blood cultures were drawn.  Portable chest x-ray showed no acute cardiopulmonary disease. EKG showed normal sinus rhythm with a rate of 92 with low voltage QRS and poor R wave progression.  The patient was given IV cefepime, vancomycin and Flagyl as well as 4 mg of IV morphine sulfate, and later IV normal saline bolus and 5 mm of Magic mouthwash.  The patient will be admitted to an observation medical monitored bed for further evaluation and management. PAST MEDICAL HISTORY:   Past Medical History:  Diagnosis Date  . Throat cancer ([place]) 2021    PAST SURGICAL HISTORY:   Past Surgical History:  Procedure Laterality Date  . EXCISION MASS NECK Left 01/18/2019   Procedure: EXCISION MASS NECK/NODE;  Surgeon: Margaretha Sheffield, MD;  Location: [facility] ORS;  Service: ENT;  Laterality: Left;  . LARYNGOSCOPY Bilateral 02/22/2019   Procedure: MICROSCOPIC DIRECT LARYNGOSCOPY AND BIOPSY;  Surgeon: Margaretha Sheffield, MD;  Location: [facility] ORS;  Service: ENT;  Laterality: Bilateral;  . PORTA CATH INSERTION N/A 03/24/2019   Procedure: PORTA CATH INSERTION;  Surgeon: Katha Cabal, MD;  Location: [facility];  Service: Cardiovascular;  Laterality: N/A;  . TONSILLECTOMY    . TUBAL LIGATION      SOCIAL HISTORY:   Social History   Tobacco Use  . Smoking status: Never Smoker  . Smokeless tobacco: Never Used  Substance Use Topics  . Alcohol use: Not Currently    FAMILY HISTORY:   Family History  Problem Relation Age of Onset  . Breast cancer Sister   . Brain cancer Paternal Grandfather     DRUG ALLERGIES:  No Known Allergies  REVIEW OF SYSTEMS:   ROS As per history of present illness. All pertinent systems were reviewed above. Constitutional,  HEENT, cardiovascular, respiratory, GI, GU, musculoskeletal, neuro, psychiatric, endocrine,  integumentary and hematologic systems were reviewed and are otherwise  negative/unremarkable except for positive findings mentioned above in the HPI.   MEDICATIONS AT HOME:   Prior to Admission medications   Medication Sig Start Date End Date Taking? Authorizing Provider  dexamethasone (DECADRON) 4 MG tablet Take 2 tablets by mouth once a day on the day after chemotherapy and  then take 2 tablets two times a day for 2 days. Take with food. 03/23/19  Yes Lequita Asal, MD  lidocaine-prilocaine (EMLA) cream Apply to affected area once 03/23/19  Yes Corcoran, Drue Second, MD  magic mouthwash w/lidocaine SOLN Take 5 mLs by mouth 4 (four) times daily as needed for mouth pain. 04/21/19  Yes Sindy Guadeloupe, MD  Morphine Sulfate (MORPHINE CONCENTRATE) 10 mg / 0.5 ml concentrated solution Take 0.5 mLs (10 mg total) by mouth  every 4 (four) hours as needed for severe pain. 05/02/19  Yes Sindy Guadeloupe, MD  ondansetron (ZOFRAN) 8 MG tablet Take 1 tablet (8 mg total) by mouth 2 (two) times daily as needed. Start on the third day after chemotherapy. 03/23/19  Yes Corcoran, Drue Second, MD  sucralfate (CARAFATE) 1 g tablet Take 1 tablet (1 g total) by mouth 3 (three) times daily before meals. Dissolve tablet in warm water, swish and swallow. 04/13/19  Yes Chrystal, Eulas Post, MD  LORazepam (ATIVAN) 0.5 MG tablet Take 1 tablet (0.5 mg total) by mouth every 6 (six) hours as needed (Nausea or vomiting). 03/23/19   Lequita Asal, MD      VITAL SIGNS:  Blood pressure 130/81, pulse 88, temperature 99.5 F (37.5 C), temperature source Oral, resp. rate 12, height 5\' 3"  (1.6 m), weight 78.5 kg, SpO2 100 %.  PHYSICAL EXAMINATION:  Physical Exam  GENERAL:  59 y.o.-year-old Caucasian female patient lying in the bed with no acute distress.  EYES: Pupils equal, round, reactive to light and accommodation. No scleral icterus. Extraocular muscles intact.  HEENT: Head atraumatic, normocephalic. Oropharynx with no oral thrush or significant pharyngeal erythema or lesions or ulcers and nasopharynx clear.  NECK:  Supple, no jugular venous distention. No thyroid enlargement.  She had bilateral submandibular tenderness more on the left than the LUNGS: Normal breath sounds bilaterally, no wheezing, rales,rhonchi or crepitation. No use of accessory muscles of respiration.  CARDIOVASCULAR: Regular rate and rhythm, S1, S2 normal. No murmurs, rubs, or gallops.  ABDOMEN: Soft, nondistended, nontender. Bowel sounds present. No organomegaly or mass.  EXTREMITIES: No pedal edema, cyanosis, or clubbing.  NEUROLOGIC: Cranial nerves II through XII are intact. Muscle strength 5/5 in all extremities. Sensation intact. Gait not checked.  PSYCHIATRIC: The patient is alert and oriented x 3.  Normal affect and good eye contact. SKIN: No obvious rash, lesion, or  ulcer.   LABORATORY PANEL:   CBC Recent Labs  Lab 05/02/19 2034  WBC 4.2  HGB 10.2*  HCT 31.1*  PLT 126*   ------------------------------------------------------------------------------------------------------------------  Chemistries  Recent Labs  Lab 05/02/19 2034  NA 137  K 4.0  CL 104  CO2 23  GLUCOSE 109*  BUN 16  CREATININE 0.72  CALCIUM 9.2  AST 32  ALT 37  ALKPHOS 62  BILITOT 0.9   ------------------------------------------------------------------------------------------------------------------  Cardiac Enzymes No results for input(s): TROPONINI in the last 168 hours. ------------------------------------------------------------------------------------------------------------------  RADIOLOGY:  DG Chest Port 1 View  Result Date: 05/02/2019 CLINICAL DATA:  Fevers, history of throat carcinoma EXAM: PORTABLE CHEST 1 VIEW COMPARISON:  12/21/2018 plain film, CT from 04/02/2019 FINDINGS: Cardiac shadow is within normal limits. Aortic calcifications are noted. Right chest wall port is noted with the catheter tip in the proximal superior vena cava. No focal infiltrate or effusion is seen. No bony abnormality is noted. IMPRESSION: No acute abnormality seen. Electronically Signed   By: Inez Catalina M.D.   On: 05/02/2019 20:49      IMPRESSION AND PLAN:  1.  Acute pharyngitis with associated odynophagia and decreased p.o. intake. -The patient will be admitted to an observation medical monitored bed. -Pain management will be provided. -We will attempt switching p.o. medications to IV. -An oncology consultation will be obtained.  I notified Dr. Janese Banks about the patient. -We will follow blood cultures. -The patient will be continued on broad-spectrum antibiotics for now including IV cefepime, vancomycin and Flagyl pending blood cultures results.  2.  GERD. -Carafate will be continued.  3.  Throat cancer status post surgical resection, undergoing chemotherapy and  radiotherapy. -Oncology consultation will be obtained by Dr. Janese Banks as mentioned above.  4.  Anxiety. -The patient will be placed on as needed Ativan  5.  DVT prophylaxis. -Subcutaneous Lovenox.  All the records are reviewed and case discussed with ED provider. The plan of care was discussed in details with the patient (and family). I answered all questions. The patient agreed to proceed with the above mentioned plan. Further management will depend upon hospital course.   CODE STATUS: Full code  TOTAL TIME TAKING CARE OF THIS PATIENT: 50 minutes.    Christel Mormon M.D on 05/02/2019 at 10:56 PM  [facility]   From 7 PM-7 AM, contact night-coverage [email or web address]  CC: Primary care physician; Patient, No Pcp Per   Note: This dictation was prepared with Dragon dictation along with smaller phrase technology. Any transcriptional errors that result from this process are unintentional.

## 2019-05-02 NOTE — ED Provider Notes (Signed)
[facility] Emergency Department Provider Note  ____________________________________________   First MD Initiated Contact with Patient 05/02/19 2008     (approximate)  I have reviewed the triage vital signs and the nursing notes.  History  Chief Complaint Fever    HPI KINSLEA EWERS is a 59 y.o. 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 Patient states she has been feeling generally unwell since yesterday. Has had acute on chronic worsening of throat pain since radiation yesterday and has not been able to take any significant PO because of this. Hasn't been able to take more than a cup of water all day. Took her temperature today and noted to be 100.5 Fahrenheit, called her oncologist who recommended evaluation in the emergency department.  Her throat pain is sharp, stabbing, severe.  Radiates up into her ears.  No significant relief with oral morphine prescribed by her oncologist.  Worsened with swallowing.  Results in significantly decreased intake and she is concerned for dehydration.  Denies any sick contacts or known COVID exposures.  No vomiting or diarrhea.  No dysuria.  No cough or respiratory symptoms. On arrival to ED her temperature is 99.5.    Past Medical Hx Past Medical History:  Diagnosis Date  . Throat cancer [facility]) 2021    Problem List Patient Active Problem List   Diagnosis Date Noted  . 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 Cellulitis 04/02/2019  . Goals of care, counseling/discussion 02/28/2019  . 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 Carcinoma of base of tongue ([place]) 02/04/2019  . Lymphadenopathy, cervical 12/28/2018  . Acute leg pain, left 04/22/2014  . Atypical chest pain 04/22/2014  . Hx of syncope 04/22/2014  . SOB (shortness of breath) 04/22/2014    Past Surgical Hx Past Surgical History:  Procedure Laterality  Date  . EXCISION MASS NECK Left 01/18/2019   Procedure: EXCISION MASS NECK/NODE;  Surgeon: Margaretha Sheffield, MD;  Location: [facility] ORS;  Service: ENT;  Laterality: Left;  . LARYNGOSCOPY Bilateral 02/22/2019   Procedure: MICROSCOPIC DIRECT LARYNGOSCOPY AND BIOPSY;  Surgeon: Margaretha Sheffield, MD;  Location: [facility] ORS;  Service: ENT;  Laterality: Bilateral;  . PORTA CATH INSERTION N/A 03/24/2019   Procedure: PORTA CATH INSERTION;  Surgeon: Katha Cabal, MD;  Location: [facility];  Service: Cardiovascular;  Laterality: N/A;  . TONSILLECTOMY    . TUBAL LIGATION      Medications Prior to Admission medications   Medication Sig Start Date End Date Taking? Authorizing Provider  dexamethasone (DECADRON) 4 MG tablet Take 2 tablets by mouth once a day on the day after chemotherapy and then take 2 tablets two times a day for 2 days. Take with food. 03/23/19   Lequita Asal, MD  lidocaine-prilocaine (EMLA) cream Apply to affected area once 03/23/19   Lequita Asal, MD  LORazepam (ATIVAN) 0.5 MG tablet Take 1 tablet (0.5 mg total) by mouth every 6 (six) hours as needed (Nausea or vomiting). 03/23/19   Lequita Asal, MD  magic mouthwash w/lidocaine SOLN Take 5 mLs by mouth 4 (four) times daily as needed for mouth pain. 04/21/19   Sindy Guadeloupe, MD  Morphine Sulfate (MORPHINE CONCENTRATE) 10 mg / 0.5 ml concentrated solution Take 0.5 mLs (10 mg total) by mouth every 4 (four) hours as needed for severe pain. 05/02/19   Sindy Guadeloupe, MD  ondansetron (ZOFRAN) 8 MG tablet Take 1 tablet (8 mg total) by mouth 2 (two) times daily as needed. Start on the third day after chemotherapy. 03/23/19   Lequita Asal, MD  sucralfate (CARAFATE) 1 g tablet Take 1 tablet (1 g total) by mouth 3 (three) times daily before meals. Dissolve tablet in warm water, swish and swallow. 04/13/19   [name], MD    Allergies Patient has no known allergies.  Family Hx Family History  Problem Relation Age of  Onset  . Breast cancer Sister   . Brain cancer Paternal Grandfather     Social Hx Social History   Tobacco Use  . Smoking status: Never Smoker  . Smokeless tobacco: Never Used  Substance Use Topics  . Alcohol use: Not Currently  . Drug use: No     Review of Systems  Constitutional: + for fever. Negative for chills. Eyes: Negative for visual changes. ENT: + for sore throat. Cardiovascular: Negative for chest pain. Respiratory: Negative for shortness of breath. Gastrointestinal: Negative for nausea. Negative for vomiting.  Genitourinary: Negative for dysuria. Musculoskeletal: Negative for leg swelling. Skin: Negative for rash. Neurological: Negative for headaches.   Physical Exam  Vital Signs: ED Triage Vitals [05/02/19 2011]  Enc Vitals Group     BP (!) 158/94     Pulse Rate 92     Resp 18     Temp 99.5 F (37.5 C)     Temp Source Oral     SpO2 100 %     Weight 173 lb (78.5 kg)     Height 5\' 3"  (1.6 m)     Head Circumference      Peak Flow      Pain Score 8     Pain Loc      Pain Edu?      Excl. in [place]?     Constitutional: Alert and oriented. Appears tired.  Head: Normocephalic. Atraumatic. Eyes: Conjunctivae clear. Sclera anicteric. Pupils equal and symmetric. Nose: No masses or lesions. No congestion or rhinorrhea. Mouth/Throat: Wearing mask. No obvious erythema or lesions of the visible oropharynx. No evidence of oral candida of the tongue or mouth.  Neck: No stridor. Trachea midline.  Cardiovascular: Normal rate, regular rhythm. Extremities well perfused. Respiratory: Normal respiratory effort.  Lungs CTAB. Gastrointestinal: Soft. Non-distended. Non-tender.  Genitourinary: Deferred. Musculoskeletal: No lower extremity edema. No deformities. Neurologic:  Normal speech and language. No gross focal or lateralizing neurologic deficits are appreciated.  Skin: Skin is warm, dry and intact. No rash noted. Psychiatric: Mood and affect are appropriate for  situation.  EKG  N/A   Radiology  CXR IMPRESSION:  No acute abnormality seen.    Procedures  Procedure(s) performed (including critical care):  Procedures   Initial Impression / Assessment and Plan / MDM / ED Course  59 y.o. female who presents to the ED for throat pain and fever  Ddx: radiation side effect, dehydration, infection, neutropenia  Will plan for labs, including cultures and dose of empiric antibiotics given immunosuppressed status on chemotherapy.   Lab work is fairly reassuring. Lactic acid and procalcitonin negative. Normal WBC count, no neutropenia. Creatinine normal.   Discussed w/ Dr. Janese Banks - given her work up thus far has been unremarkable, it would be reasonable to consider discharge w/ course of oral antibiotics and close f/u in clinic tomorrow morning. Discussed this option w/ patient. Unfortunately she does not feel comfortable w/ discharge at this time due to ongoing severe throat pain and  difficulty tolerating PO (has only managed a few ice chips here) as she does not feel she'd be able to keep down antibiotics. Discussed case w/ hospitalist who agrees w/ admission. COVID testing deferred at this time per protocol as she has had a positive test (beginning of Janruary) w/in the last 90 days.    _______________________________   As part of my medical decision making I have reviewed available labs, radiology tests, reviewed old records, discussed with Dr. Janese Banks, oncology.   Final Clinical Impression(s) / ED Diagnosis  Final diagnoses:  Sore throat  Fever in adult  Patient on combined chemotherapy and radiation       Note:  This document was prepared using Dragon voice recognition software and may include unintentional dictation errors.   Lilia Pro., MD 05/03/19 [place] [place]

## 2019-05-03 ENCOUNTER — Ambulatory Visit
Admission: RE | Admit: 2019-05-03 | Discharge: 2019-05-03 | Disposition: A | Discharge status: Home / Self Care | Payer: Medicaid Other | Source: Ambulatory Visit | Attending: Radiation Oncology | Admitting: Radiation Oncology

## 2019-05-03 ENCOUNTER — Encounter: Payer: Self-pay | Admitting: Internal Medicine

## 2019-05-03 DIAGNOSIS — H9313 Tinnitus, bilateral: Secondary | ICD-10-CM | POA: Diagnosis present

## 2019-05-03 DIAGNOSIS — Z808 Family history of malignant neoplasm of other organs or systems: Secondary | ICD-10-CM | POA: Diagnosis not present

## 2019-05-03 DIAGNOSIS — R34 Anuria and oliguria: Secondary | ICD-10-CM | POA: Diagnosis not present

## 2019-05-03 DIAGNOSIS — K219 Gastro-esophageal reflux disease without esophagitis: Secondary | ICD-10-CM | POA: Diagnosis present

## 2019-05-03 DIAGNOSIS — J029 Acute pharyngitis, unspecified: Secondary | ICD-10-CM | POA: Diagnosis not present

## 2019-05-03 DIAGNOSIS — R1310 Dysphagia, unspecified: Secondary | ICD-10-CM | POA: Diagnosis present

## 2019-05-03 DIAGNOSIS — D61810 Antineoplastic chemotherapy induced pancytopenia: Secondary | ICD-10-CM | POA: Diagnosis present

## 2019-05-03 DIAGNOSIS — J40 Bronchitis, not specified as acute or chronic: Secondary | ICD-10-CM | POA: Diagnosis present

## 2019-05-03 DIAGNOSIS — F419 Anxiety disorder, unspecified: Secondary | ICD-10-CM | POA: Diagnosis present

## 2019-05-03 DIAGNOSIS — Z803 Family history of malignant neoplasm of breast: Secondary | ICD-10-CM | POA: Diagnosis not present

## 2019-05-03 DIAGNOSIS — H905 Unspecified sensorineural hearing loss: Secondary | ICD-10-CM | POA: Diagnosis present

## 2019-05-03 DIAGNOSIS — C01 Malignant neoplasm of base of tongue: Secondary | ICD-10-CM | POA: Diagnosis present

## 2019-05-03 LAB — URINALYSIS, COMPLETE (UACMP) WITH MICROSCOPIC
Bacteria, UA: NONE SEEN
Bilirubin Urine: NEGATIVE
Glucose, UA: NEGATIVE mg/dL
Hgb urine dipstick: NEGATIVE
Ketones, ur: 5 mg/dL — AB
Nitrite: NEGATIVE
Protein, ur: NEGATIVE mg/dL
Specific Gravity, Urine: 1.02 (ref 1.005–1.030)
pH: 5 (ref 5.0–8.0)

## 2019-05-03 LAB — CBC
HCT: 29.3 % — ABNORMAL LOW (ref 36.0–46.0)
Hemoglobin: 9.6 g/dL — ABNORMAL LOW (ref 12.0–15.0)
MCH: 30.1 pg (ref 26.0–34.0)
MCHC: 32.8 g/dL (ref 30.0–36.0)
MCV: 91.8 fL (ref 80.0–100.0)
Platelets: 113 10*3/uL — ABNORMAL LOW (ref 150–400)
RBC: 3.19 MIL/uL — ABNORMAL LOW (ref 3.87–5.11)
RDW: 13 % (ref 11.5–15.5)
WBC: 2.3 10*3/uL — ABNORMAL LOW (ref 4.0–10.5)
nRBC: 0 % (ref 0.0–0.2)

## 2019-05-03 LAB — BASIC METABOLIC PANEL
Anion gap: 7 (ref 5–15)
BUN: 14 mg/dL (ref 6–20)
CO2: 24 mmol/L (ref 22–32)
Calcium: 8.4 mg/dL — ABNORMAL LOW (ref 8.9–10.3)
Chloride: 108 mmol/L (ref 98–111)
Creatinine, Ser: 0.66 mg/dL (ref 0.44–1.00)
GFR calc Af Amer: >60 mL/min (ref >60)
GFR calc non Af Amer: >60 mL/min (ref >60)
Glucose, Bld: 91 mg/dL (ref 70–99)
Potassium: 3.9 mmol/L (ref 3.5–5.1)
Sodium: 139 mmol/L (ref 135–145)

## 2019-05-03 LAB — CORTISOL-AM, BLOOD: Cortisol - AM: 10.1 ug/dL (ref 6.7–22.6)

## 2019-05-03 LAB — PROCALCITONIN: Procalcitonin: <0.1 ng/mL

## 2019-05-03 LAB — PROTIME-INR
INR: 1.1 (ref 0.8–1.2)
Prothrombin Time: 13.6 seconds (ref 11.4–15.2)

## 2019-05-03 MED ORDER — MENTHOL 3 MG MT LOZG
1.0000 | LOZENGE | OROMUCOSAL | Status: DC | PRN
  Filled 2019-05-03: qty 9

## 2019-05-03 MED ORDER — LORAZEPAM 2 MG/ML IJ SOLN
0.5000 mg | INTRAMUSCULAR | Status: DC | PRN
  Administered 2019-05-03: 0.5 mg via INTRAVENOUS
  Filled 2019-05-03: qty 1

## 2019-05-03 MED ORDER — KETOROLAC TROMETHAMINE 30 MG/ML IJ SOLN: 30.0000 mg | Freq: Once | INTRAMUSCULAR | Status: DC

## 2019-05-03 MED ORDER — VANCOMYCIN HCL 750 MG/150ML IV SOLN
750.0000 mg | INTRAVENOUS | Status: DC
  Administered 2019-05-03: 750 mg via INTRAVENOUS
  Filled 2019-05-03 (×4): qty 150

## 2019-05-03 MED ORDER — KETOROLAC TROMETHAMINE 30 MG/ML IJ SOLN
15.0000 mg | Freq: Four times a day (QID) | INTRAMUSCULAR | Status: DC
  Administered 2019-05-03 – 2019-05-05 (×6): 15 mg via INTRAVENOUS
  Filled 2019-05-03 (×7): qty 1

## 2019-05-03 NOTE — Progress Notes (Signed)
Pharmacy Antibiotic Note  Connie Cameron is a 59 y.o. female admitted on 05/02/2019 with fever/unknown source.  Pharmacy has been consulted for cefepime and vancomycin dosing.  Pt received cefepime 2 g IV x1 and vancomycin 1750 mg IV x1 in the ED  Plan: Cefepime 2 g IV q8h   Vancomycin 750 mg IV Q 18 hrs. Goal AUC 400-550. Expected AUC: 447 SCr used: 0.8 Expected Cmin: 11.3  Will order SCr in AM to continue to monitor renal function.     Height: 5\' 3"  (160 cm) Weight: 173 lb (78.5 kg) IBW/kg (Calculated) : 52.4  Temp (24hrs), Avg:99.5 F (37.5 C), Min:99.5 F (37.5 C), Max:99.5 F (37.5 C)  Recent Labs  Lab 05/02/19 2034 05/03/19 0618  WBC 4.2 2.3*  CREATININE 0.72 0.66  LATICACIDVEN 1.1  --     Estimated Creatinine Clearance: 76 mL/min (by C-G formula based on SCr of 0.66 mg/dL).    No Known Allergies  Antimicrobials this admission: Cefepime 3/28 >> Vanc 3/28 >> Flagyl 3/28 >>  Dose adjustments this admission:   Microbiology results: 3/28 BCx: NGTD x2  Thank you for allowing pharmacy to be a part of this patient's care.  Rocky Morel 05/03/2019 8:41 AM

## 2019-05-03 NOTE — ED Notes (Signed)
Pt transported to cancer center for radiation treatment.

## 2019-05-03 NOTE — Progress Notes (Signed)
PROGRESS NOTE    Connie Cameron  [id number] DOB: [birth year] DOA: 05/02/2019 PCP: Patient, No Pcp Per   Brief Narrative:   59 y.o. Caucasian female with a known history of throat cancer status post surgical resection, on daily radiotherapy and on chemotherapy since February, who presented to the emergency room with acute onset of fever of 100.5 today with associated sore throat that started on Saturday night with inability to drink fluids today secondary to pain.  She called her oncologist who recommended her come in to the ER.  She denies any chills.  No nausea or vomiting or abdominal pain.  No cough or wheezing.  No dysuria or hematuria or flank pain.  She admits to oliguria with decreased p.o. intake. Her last chemotherapy session was about a week ago.  Upon presentation to the emergency room, blood pressure was 158/94 with otherwise normal vital signs.  Labs revealed normal CMP and CBC showed anemia close to previous levels.  Lactic acid was 1.1 and procalcitonin was less than 0.1.  Blood cultures were drawn.  Portable chest x-ray showed no acute cardiopulmonary disease. EKG showed normal sinus rhythm with a rate of 92 with low voltage QRS and poor R wave progression.  The patient was given IV cefepime, vancomycin and Flagyl as well as 4 mg of IV morphine sulfate, and later IV normal saline bolus and 5 mm of Magic mouthwash.  The patient will be admitted to an observation medical monitored bed for further evaluation and management.  3/29: Patient seen and examined.  Remains in the emergency department.  Was transported to radiation from the ED.  Continues to endorse severe dysphagia and a odynophagia.  Etiology unclear.  Unclear if there is an infectious focus.   Assessment & Plan:   Active Problems:   Acute pharyngitis   Bronchitis  Acute pharyngitis Odynophagia Decreased p.o. intake Patient had a acute change in her ability to swallow Thus far prescribed pain medications have  been inadequate in controlling symptoms Patient started on empiric broad-spectrum antibiotics on admission Plan: Admit inpatient Continue broad-spectrum antibiotics for 1 additional day Anticipate transition to oral antibiotic coverage starting tomorrow Multimodal pain regimen Scheduled Toradol Offered GI cocktail, patient states she cannot tolerate As needed Cepacol lozenge Follow cultures, de-escalate versus discontinue antibiotics quickly  Throat cancer status post surgical resection Currently on chemotherapy and radiation Dr. Janese Banks aware patient admission  GERD On Carafate   DVT prophylaxis: Lovenox Code Status: Full Family Communication: None today Disposition Plan: Anticipate return to previous home environment within 24 to 48 hours.  Patient will need improvement in her odynophagia and increased ability to tolerate p.o.   Consultants:   Oncology  Procedures:   None  Antimicrobials:   Vancomycin (05/02/2019-    Cefepime (05/02/2019-    Flagyl (05/02/2019-   Subjective: Seen and examined Still endorsing dysphagia and odynophagia No fevers noted over interval  Objective: Vitals:   05/03/19 1130 05/03/19 1200 05/03/19 1230 05/03/19 1300  BP: 114/73 114/71 113/75 138/75  Pulse: 70 63 72 71  Resp: 15 12 14 12   Temp:      TempSrc:      SpO2: 97% 98% 97% 100%  Weight:      Height:        Intake/Output Summary (Last 24 hours) at 05/03/2019 1322 Last data filed at 05/03/2019 0107 Gross per 24 hour  Intake 1544.23 ml  Output -  Net 1544.23 ml   Filed Weights   05/02/19 2011  Weight: 78.5  kg    Examination:  General exam: Appears calm and comfortable HEENT: Oropharynx no thrush or significant pharyngeal edema Bilateral submandibular tenderness, worse on the left Respiratory system: Clear to auscultation. Respiratory effort normal. Cardiovascular system: S1 & S2 heard, RRR. No JVD, murmurs, rubs, gallops or clicks. No pedal edema. Gastrointestinal  system: Abdomen is nondistended, soft and nontender. No organomegaly or masses felt. Normal bowel sounds heard. Central nervous system: Alert and oriented. No focal neurological deficits. Extremities: Symmetric 5 x 5 power. Skin: No rashes, lesions or ulcers Psychiatry: Judgement and insight appear normal. Mood & affect appropriate.     Data Reviewed: I have personally reviewed following labs and imaging studies  CBC: Recent Labs  Lab 05/02/19 2034 05/03/19 0618  WBC 4.2 2.3*  NEUTROABS 3.3  --   HGB 10.2* 9.6*  HCT 31.1* 29.3*  MCV 90.9 91.8  PLT 126* 123456*   Basic Metabolic Panel: Recent Labs  Lab 05/02/19 2034 05/03/19 0618  NA 137 139  K 4.0 3.9  CL 104 108  CO2 23 24  GLUCOSE 109* 91  BUN 16 14  CREATININE 0.72 0.66  CALCIUM 9.2 8.4*   GFR: Estimated Creatinine Clearance: 76 mL/min (by C-G formula based on SCr of 0.66 mg/dL). Liver Function Tests: Recent Labs  Lab 05/02/19 2034  AST 32  ALT 37  ALKPHOS 62  BILITOT 0.9  PROT 7.1  ALBUMIN 4.2   No results for input(s): LIPASE, AMYLASE in the last 168 hours. No results for input(s): AMMONIA in the last 168 hours. Coagulation Profile: Recent Labs  Lab 05/03/19 0618  INR 1.1   Cardiac Enzymes: No results for input(s): CKTOTAL, CKMB, CKMBINDEX, TROPONINI in the last 168 hours. BNP (last 3 results) No results for input(s): PROBNP in the last 8760 hours. HbA1C: No results for input(s): HGBA1C in the last 72 hours. CBG: No results for input(s): GLUCAP in the last 168 hours. Lipid Profile: No results for input(s): CHOL, HDL, LDLCALC, TRIG, CHOLHDL, LDLDIRECT in the last 72 hours. Thyroid Function Tests: No results for input(s): TSH, T4TOTAL, FREET4, T3FREE, THYROIDAB in the last 72 hours. Anemia Panel: No results for input(s): VITAMINB12, FOLATE, FERRITIN, TIBC, IRON, RETICCTPCT in the last 72 hours. Sepsis Labs: Recent Labs  Lab 05/02/19 2034 05/03/19 0618  PROCALCITON <0.10 <0.10  LATICACIDVEN  1.1  --     Recent Results (from the past 240 hour(s))  Blood Culture (routine x 2)     Status: None (Preliminary result)   Collection Time: 05/02/19  8:34 PM   Specimen: BLOOD  Result Value Ref Range Status   Specimen Description BLOOD RIGHT FA  Final   Special Requests   Final    BOTTLES DRAWN AEROBIC AND ANAEROBIC Blood Culture adequate volume   Culture   Final    NO GROWTH < 12 HOURS Performed at [facility], [address] [place], [place] [address]    Report Status PENDING  Incomplete  Blood Culture (routine x 2)     Status: None (Preliminary result)   Collection Time: 05/02/19  8:34 PM   Specimen: BLOOD  Result Value Ref Range Status   Specimen Description BLOOD RIGHT HAND  Final   Special Requests   Final    BOTTLES DRAWN AEROBIC AND ANAEROBIC Blood Culture results may not be optimal due to an inadequate volume of blood received in culture bottles   Culture   Final    NO GROWTH < 12 HOURS Performed at [facility], [place]  [address], [place], [place] [address]    Report Status PENDING  Incomplete         Radiology Studies: DG Chest Port 1 View  Result Date: 05/02/2019 CLINICAL DATA:  Fevers, history of throat carcinoma EXAM: PORTABLE CHEST 1 VIEW COMPARISON:  12/21/2018 plain film, CT from 04/02/2019 FINDINGS: Cardiac shadow is within normal limits. Aortic calcifications are noted. Right chest wall port is noted with the catheter tip in the proximal superior vena cava. No focal infiltrate or effusion is seen. No bony abnormality is noted. IMPRESSION: No acute abnormality seen. Electronically Signed   By: Inez Catalina M.D.   On: 05/02/2019 20:49        Scheduled Meds: . enoxaparin (LOVENOX) injection  40 mg Subcutaneous Q24H  . ketorolac  15 mg Intravenous Q6H  . magic mouthwash  5 mL Oral Once  . sucralfate  1 g Oral TID AC   Continuous Infusions: . sodium chloride 100 mL/hr at 05/03/19 1054  . ceFEPime (MAXIPIME) IV Stopped (05/03/19  [id number])  . metronidazole Stopped (05/03/19 [id number])  . vancomycin       LOS: 0 days    Time spent: 35 minutes    Sidney Ace, MD [facility] Pager [phone]  If 7PM-7AM, please contact night-coverage  05/03/2019, 1:22 PM

## 2019-05-03 NOTE — TOC Initial Note (Signed)
Transition of Care [facility]) - Initial/Assessment Note    Patient Details  Name: Connie Cameron MRN: [id number] Date of Birth: 1960-05-05  Transition of Care [facility]) CM/SW Contact:    Elease Hashimoto, LCSW Phone Number: 05/03/2019, 4:26 PM  Clinical Narrative:  Met with pt who reports she lives with her friend and they help each other. He is disabled and can be there but she does want him to have to do anything for her. She has been independent but the radiation and chemo are taking its toll on her. She is not able to eat or barely drink tiny sips due to burning in her throat. She realizes this will dehydrate her and is trying for this not to happen. She has two grown son's one is local but is busy and has trouble with seeing his Mom like this. She does drive and has a PCP. She has a strong faith and knows she will get through this, but needs some support at times. Will continue to follow and provide support and will also ask chaplain to see her while here. Check on her tomorrow.              Expected Discharge Plan: Home/Self Care Barriers to Discharge: Continued Medical Work up   Patient Goals and CMS Choice Patient states their goals for this hospitalization and ongoing recovery are:: I hope to be able to drink I can only drink tiny sips      Expected Discharge Plan and Services Expected Discharge Plan: Home/Self Care In-house Referral: Clinical Social Work     Living arrangements for the past 2 months: Single Family Home                                      Prior Living Arrangements/Services Living arrangements for the past 2 months: Single Family Home Lives with:: Friends Patient language and need for interpreter reviewed:: No Do you feel safe going back to the place where you live?: Yes      Need for Family Participation in Patient Care: No (Comment) Care giver support system in place?: Yes (comment)   Criminal Activity/Legal Involvement Pertinent to Current  Situation/Hospitalization: No - Comment as needed  Activities of Daily Living Home Assistive Devices/Equipment: None ADL Screening (condition at time of admission) Patient's cognitive ability adequate to safely complete daily activities?: Yes Is the patient deaf or have difficulty hearing?: No Does the patient have difficulty seeing, even when wearing glasses/contacts?: No Does the patient have difficulty concentrating, remembering, or making decisions?: No Patient able to express need for assistance with ADLs?: Yes Does the patient have difficulty dressing or bathing?: No Independently performs ADLs?: Yes (appropriate for developmental age) Does the patient have difficulty walking or climbing stairs?: No Weakness of Legs: None Weakness of Arms/Hands: None  Permission Sought/Granted Permission sought to share information with : Other (comment) Permission granted to share information with : Yes, Verbal Permission Granted  Share Information with NAME: Connie Cameron     Permission granted to share info w Relationship: Friend     Emotional Assessment Appearance:: Appears stated age Attitude/Demeanor/Rapport: Gracious Affect (typically observed): Adaptable, Accepting Orientation: : Oriented to Self, Oriented to Place, Oriented to  Time, Oriented to Situation   Psych Involvement: No (comment)  Admission diagnosis:  Acute pharyngitis [J02.9] Sore throat [J02.9] Bronchitis [J40] Patient on combined chemotherapy and radiation [Z79.899, Z78.9] Fever in adult [R50.9] Patient Active  Problem List   Diagnosis Date Noted  . Bronchitis 05/03/2019  . 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 Acute pharyngitis 05/02/2019  . Cellulitis 04/02/2019  . Goals of care, counseling/discussion 02/28/2019  . 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 Lymphadenopathy, cervical 12/28/2018  . Acute leg pain, left 04/22/2014  . Atypical chest pain  04/22/2014  . Hx of syncope 04/22/2014  . SOB (shortness of breath) 04/22/2014   PCP:  Patient, No Pcp Per Pharmacy:   [id number] [place], [place] [id number]: [phone]: [phone]    Social Determinants of Health (SDOH) Interventions    Readmission Risk Interventions No flowsheet data found.

## 2019-05-04 ENCOUNTER — Ambulatory Visit: Payer: Medicaid Other

## 2019-05-04 DIAGNOSIS — R1310 Dysphagia, unspecified: Secondary | ICD-10-CM

## 2019-05-04 DIAGNOSIS — R509 Fever, unspecified: Secondary | ICD-10-CM

## 2019-05-04 DIAGNOSIS — Z803 Family history of malignant neoplasm of breast: Secondary | ICD-10-CM

## 2019-05-04 DIAGNOSIS — C01 Malignant neoplasm of base of tongue: Secondary | ICD-10-CM

## 2019-05-04 DIAGNOSIS — Z79899 Other long term (current) drug therapy: Secondary | ICD-10-CM

## 2019-05-04 DIAGNOSIS — D61810 Antineoplastic chemotherapy induced pancytopenia: Secondary | ICD-10-CM

## 2019-05-04 LAB — CBC WITH DIFFERENTIAL/PLATELET
Abs Immature Granulocytes: 0 10*3/uL (ref 0.00–0.07)
Basophils Absolute: 0 10*3/uL (ref 0.0–0.1)
Basophils Relative: 0 %
Eosinophils Absolute: 0 10*3/uL (ref 0.0–0.5)
Eosinophils Relative: 0 %
HCT: 25.4 % — ABNORMAL LOW (ref 36.0–46.0)
Hemoglobin: 8.3 g/dL — ABNORMAL LOW (ref 12.0–15.0)
Immature Granulocytes: 0 %
Lymphocytes Relative: 12 %
Lymphs Abs: 0.3 10*3/uL — ABNORMAL LOW (ref 0.7–4.0)
MCH: 29.9 pg (ref 26.0–34.0)
MCHC: 32.7 g/dL (ref 30.0–36.0)
MCV: 91.4 fL (ref 80.0–100.0)
Monocytes Absolute: 0.5 10*3/uL (ref 0.1–1.0)
Monocytes Relative: 21 %
Neutro Abs: 1.5 10*3/uL — ABNORMAL LOW (ref 1.7–7.7)
Neutrophils Relative %: 67 %
Platelets: 109 10*3/uL — ABNORMAL LOW (ref 150–400)
RBC: 2.78 MIL/uL — ABNORMAL LOW (ref 3.87–5.11)
RDW: 12.8 % (ref 11.5–15.5)
WBC: 2.3 10*3/uL — ABNORMAL LOW (ref 4.0–10.5)
nRBC: 0 % (ref 0.0–0.2)

## 2019-05-04 LAB — CREATININE, SERUM
Creatinine, Ser: 0.72 mg/dL (ref 0.44–1.00)
GFR calc Af Amer: >60 mL/min (ref >60)
GFR calc non Af Amer: >60 mL/min (ref >60)

## 2019-05-04 LAB — URINE CULTURE: Culture: NO GROWTH

## 2019-05-04 MED ORDER — DEXAMETHASONE 4 MG PO TABS
4.0000 mg | ORAL_TABLET | Freq: Two times a day (BID) | ORAL | Status: DC
  Administered 2019-05-04 – 2019-05-05 (×2): 4 mg via ORAL
  Filled 2019-05-04 (×4): qty 1

## 2019-05-04 NOTE — Progress Notes (Signed)
Pharmacist Chemotherapy Monitoring - Follow Up Assessment    I verify that I have reviewed each item in the below checklist:  . Regimen for the patient is scheduled for the appropriate day and plan matches scheduled date. Marland Kitchen Appropriate non-routine labs are ordered dependent on drug ordered. . If applicable, additional medications reviewed and ordered per protocol based on lifetime cumulative doses and/or treatment regimen.   Plan for follow-up and/or issues identified: No . I-vent associated with next due treatment: No . MD and/or nursing notified: No  Margia Wiesen K 05/04/2019 8:41 AM

## 2019-05-04 NOTE — Consult Note (Signed)
[facility]  Date of admission:  05/03/2019  Inpatient day:  05/04/2019  Consulting physician: Dr Christel Mormon  Reason for Consultation:  Throat cancer on chemotherapy and radiotherapy coming with sore throat and odynophagia with fever.  Chief Complaint: Connie Cameron is a 59 y.o. female with stage I base of tongue carcinoma currently day 15 s/p cycle #2 cisplatin with concurrent radiation who was admitted through the emergency room with difficulty swallowing and a low grade fever.  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 Node was 4.0 x 2.3 x 1.8 cm.  PET scan on 02/04/2019 revealed prior left level II cervical node had been excised. There were no findings on PET to suggest a site of primary head/neck cancer. There was symmetric hypermetabolism along the base of the tongue bilaterally favored to be physiologic. There was no evidence of metastatic disease.   Microscopic direct laryngoscopy and biopsy on 02/22/2019 noted fullness at the left tongue base.  Pathology confirmed poorly differentiated squamous cell carcinoma.  Malignant cells were positive for p16, p40, and pancytokeratin.  She began radiation on 03/30/2019.  She has had 2 of 3 cycles of cisplatin (03/30/2019 - 04/20/2019).  She was admitted to [facility] from 04/02/2019 - 04/06/2019 with cellulitis involving the trapezius and right supraclavicular area.  Port-a-cath was uninvolved.  She received cefepime and vancomycin.  She was discharged on doxycycline.  She was last seen in the medical oncology clinic on 04/28/2019.  At that time, she noted a sore throat felt secondary to radiation.  Exam revealed no oral lesions.  Discomfort was managed with Magic mouthwash.  She had tinnitus.    Repeat audiogram revealed a significant decrease in high-frequency hearing.  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  Pain on swallowing progressed.  She was prescribed morphine liquid 10 mg q 4 hours prn pain on 05/02/2019.  She presented to the [facility] ER on 05/02/2019 with progressive throat pain unrelieved by liquid morphine. Oral intake had decreased.  She noted a temperature of 100.5 at home.  CBC revealed a hematocrit of 31.1, hemoglobin 10.2, platelets 126,000, WBC 4200 with an ANC of 3300.  Lactic acid and procalcitonin were normal.  CXR was negative.  Blood cultures were drawn and negative to date.  She was empirically started on Cefepime, vancomycin, and Flagyl.  Symptomatically, she notes burning with swallowing.  Pain is in the back of her tongue and down her esophagus to her mid neck.  Pain is less, but still persistent with IV and oral morphine.  She denies any fever.  Tinnitus has improved.   Past Medical History:  Diagnosis Date  . Throat cancer ([place]) 2021    Past Surgical History:  Procedure Laterality Date  . EXCISION MASS NECK Left 01/18/2019   Procedure: EXCISION MASS NECK/NODE;  Surgeon: Margaretha Sheffield, MD;  Location: [facility] ORS;  Service: ENT;  Laterality: Left;  . LARYNGOSCOPY Bilateral 02/22/2019   Procedure: MICROSCOPIC DIRECT LARYNGOSCOPY AND BIOPSY;  Surgeon: Margaretha Sheffield, MD;  Location: [facility] ORS;  Service: ENT;  Laterality: Bilateral;  . PORTA CATH INSERTION N/A 03/24/2019   Procedure: PORTA CATH INSERTION;  Surgeon: Katha Cabal, MD;  Location: [place] [facility];  Service: Cardiovascular;  Laterality: N/A;  . TONSILLECTOMY    . TUBAL  LIGATION      Family History  Problem Relation Age of Onset  . Breast cancer Sister   . Brain cancer Paternal Grandfather     Social History:  reports that she has never smoked. She has never used smokeless tobacco. She reports previous alcohol use. She reports that she does not use drugs.  She  is initially alone today then accompanied by her nurse.  Allergies: No Known Allergies  Medications Prior to Admission  Medication Sig Dispense Refill  . dexamethasone (DECADRON) 4 MG tablet Take 2 tablets by mouth once a day on the day after chemotherapy and then take 2 tablets two times a day for 2 days. Take with food. 30 tablet 1  . lidocaine-prilocaine (EMLA) cream Apply to affected area once 30 g 3  . magic mouthwash w/lidocaine SOLN Take 5 mLs by mouth 4 (four) times daily as needed for mouth pain. 240 mL 1  . Morphine Sulfate (MORPHINE CONCENTRATE) 10 mg / 0.5 ml concentrated solution Take 0.5 mLs (10 mg total) by mouth every 4 (four) hours as needed for severe pain. 30 mL 0  . ondansetron (ZOFRAN) 8 MG tablet Take 1 tablet (8 mg total) by mouth 2 (two) times daily as needed. Start on the third day after chemotherapy. 30 tablet 1  . sucralfate (CARAFATE) 1 g tablet Take 1 tablet (1 g total) by mouth 3 (three) times daily before meals. Dissolve tablet in warm water, swish and swallow. 90 tablet 6  . LORazepam (ATIVAN) 0.5 MG tablet Take 1 tablet (0.5 mg total) by mouth every 6 (six) hours as needed (Nausea or vomiting). 30 tablet 0    Review of Systems: GENERAL:  Feels good.  Fever of 100.5 at home and 99 in hospital.  No sweats or weight loss. PERFORMANCE STATUS (ECOG):  1-2 HEENT:  Sore throat.  No mouth sores.  Tinnitus, improved.  No visual changes, runny nose.  Lungs: No shortness of breath or cough.  No hemoptysis. Cardiac:  No chest pain, palpitations, orthopnea, or PND. GI:  Odynophagia.  No nausea, vomiting, diarrhea, constipation, melena or hematochezia. GU:  No urgency, frequency, dysuria, or hematuria. Musculoskeletal:  No back pain.  No joint pain.  No muscle tenderness. Extremities:  No pain or swelling. Skin:  No rashes or skin changes. Neuro:  No headache, numbness or weakness, balance or coordination issues. Endocrine:  No diabetes, thyroid issues, hot flashes or  night sweats. Psych:  No mood changes, depression or anxiety. Pain:  Pain on swallowing. Review of systems:  All other systems reviewed and found to be negative.  Physical Exam:  Blood pressure 136/76, pulse 72, temperature 98.4 F (36.9 C), temperature source Oral, resp. rate 18, height 5\' 3"  (1.6 m), weight 173 lb (78.5 kg), SpO2 97 %.  GENERAL:  Well developed, well nourished, woman lying comfortably on the medical unit in no acute distress. MENTAL STATUS:  Alert and oriented to person, place and time. HEAD:  Long hair.  Normocephalic, atraumatic, face symmetric, no Cushingoid features. EYES:  Pupils equal round and reactive to light and accomodation.  No conjunctivitis or scleral icterus. ENT:  Early oral changes without mucositis.  Tongue normal. Mucous membranes moist.  RESPIRATORY:  Clear to auscultation anteriorly without rales, wheezes or rhonchi. CARDIOVASCULAR:  Regular rate and rhythm without murmur, rub or gallop. CHEST:  Right sided port site unremarkable (not accessed). ABDOMEN:  Soft, non-tender, with active bowel sounds, and no hepatosplenomegaly.  No masses. SKIN:  No rashes, ulcers  or lesions. EXTREMITIES: No edema, no skin discoloration or tenderness.  No palpable cords. LYMPH NODES: No palpable cervical, supraclavicular, axillary or inguinal adenopathy  NEUROLOGICAL: Unremarkable. PSYCH:  Appropriate.   Results for orders placed or performed during the hospital encounter of 05/02/19 (from the past 48 hour(s))  Lactic acid, plasma     Status: None   Collection Time: 05/02/19  8:34 PM  Result Value Ref Range   Lactic Acid, Venous 1.1 0.5 - 1.9 mmol/L    Comment: Performed at [facility], [address], [place], [place] [address]  Comprehensive metabolic panel     Status: Abnormal   Collection Time: 05/02/19  8:34 PM  Result Value Ref Range   Sodium 137 135 - 145 mmol/L   Potassium 4.0 3.5 - 5.1 mmol/L   Chloride 104 98 - 111 mmol/L   CO2 23 22 - 32  mmol/L   Glucose, Bld 109 (H) 70 - 99 mg/dL    Comment: Glucose reference range applies only to samples taken after fasting for at least 8 hours.   BUN 16 6 - 20 mg/dL   Creatinine, Ser 0.72 0.44 - 1.00 mg/dL   Calcium 9.2 8.9 - 10.3 mg/dL   Total Protein 7.1 6.5 - 8.1 g/dL   Albumin 4.2 3.5 - 5.0 g/dL   AST 32 15 - 41 U/L   ALT 37 0 - 44 U/L   Alkaline Phosphatase 62 38 - 126 U/L   Total Bilirubin 0.9 0.3 - 1.2 mg/dL   GFR calc non Af Amer >60 >60 mL/min   GFR calc Af Amer >60 >60 mL/min   Anion gap 10 5 - 15    Comment: Performed at [facility], [address] [place], [place] [address]  CBC WITH DIFFERENTIAL     Status: Abnormal   Collection Time: 05/02/19  8:34 PM  Result Value Ref Range   WBC 4.2 4.0 - 10.5 K/uL   RBC 3.42 (L) 3.87 - 5.11 MIL/uL   Hemoglobin 10.2 (L) 12.0 - 15.0 g/dL   HCT 31.1 (L) 36.0 - 46.0 %   MCV 90.9 80.0 - 100.0 fL   MCH 29.8 26.0 - 34.0 pg   MCHC 32.8 30.0 - 36.0 g/dL   RDW 12.9 11.5 - 15.5 %   Platelets 126 (L) 150 - 400 K/uL   nRBC 0.0 0.0 - 0.2 %   Neutrophils Relative % 78 %   Neutro Abs 3.3 1.7 - 7.7 K/uL   Lymphocytes Relative 8 %   Lymphs Abs 0.3 (L) 0.7 - 4.0 K/uL   Monocytes Relative 14 %   Monocytes Absolute 0.6 0.1 - 1.0 K/uL   Eosinophils Relative 0 %   Eosinophils Absolute 0.0 0.0 - 0.5 K/uL   Basophils Relative 0 %   Basophils Absolute 0.0 0.0 - 0.1 K/uL   Immature Granulocytes 0 %   Abs Immature Granulocytes 0.01 0.00 - 0.07 K/uL    Comment: Performed at [facility], [address] [place], [place] [address]  Blood Culture (routine x 2)     Status: None (Preliminary result)   Collection Time: 05/02/19  8:34 PM   Specimen: BLOOD  Result Value Ref Range   Specimen Description BLOOD RIGHT FA    Special Requests      BOTTLES DRAWN AEROBIC AND ANAEROBIC Blood Culture adequate volume   Culture      NO GROWTH 2 DAYS Performed at [facility], [address] [place], [place] [address]  Report Status PENDING   Blood Culture (routine x 2)     Status: None (Preliminary result)   Collection Time: 05/02/19  8:34 PM   Specimen: BLOOD  Result Value Ref Range   Specimen Description BLOOD RIGHT HAND    Special Requests      BOTTLES DRAWN AEROBIC AND ANAEROBIC Blood Culture results may not be optimal due to an inadequate volume of blood received in culture bottles   Culture      NO GROWTH 2 DAYS Performed at [facility], [address], [place], [place] [address]    Report Status PENDING   Procalcitonin     Status: None   Collection Time: 05/02/19  8:34 PM  Result Value Ref Range   Procalcitonin <0.10 ng/mL    Comment:        Interpretation: PCT (Procalcitonin) <= 0.5 ng/mL: Systemic infection (sepsis) is not likely. Local bacterial infection is possible. (NOTE)       Sepsis PCT Algorithm           Lower Respiratory Tract                                      Infection PCT Algorithm    ----------------------------     ----------------------------         PCT < 0.25 ng/mL                PCT < 0.10 ng/mL         Strongly encourage             Strongly discourage   discontinuation of antibiotics    initiation of antibiotics    ----------------------------     -----------------------------       PCT 0.25 - 0.50 ng/mL            PCT 0.10 - 0.25 ng/mL               OR       >80% decrease in PCT            Discourage initiation of                                            antibiotics      Encourage discontinuation           of antibiotics    ----------------------------     -----------------------------         PCT >= 0.50 ng/mL              PCT 0.26 - 0.50 ng/mL               AND        <80% decrease in PCT             Encourage initiation of                                             antibiotics       Encourage continuation           of antibiotics    ----------------------------     -----------------------------        PCT >= 0.50  ng/mL                   PCT > 0.50 ng/mL               AND         increase in PCT                  Strongly encourage                                      initiation of antibiotics    Strongly encourage escalation           of antibiotics                                     -----------------------------                                           PCT <= 0.25 ng/mL                                                 OR                                        > 80% decrease in PCT                                     Discontinue / Do not initiate                                             antibiotics Performed at [facility], [address] [place], [place] [address]   Urinalysis, Complete w Microscopic     Status: Abnormal   Collection Time: 05/02/19  8:34 PM  Result Value Ref Range   Color, Urine YELLOW (A) YELLOW   APPearance HAZY (A) CLEAR   Specific Gravity, Urine 1.020 1.005 - 1.030   pH 5.0 5.0 - 8.0   Glucose, UA NEGATIVE NEGATIVE mg/dL   Hgb urine dipstick NEGATIVE NEGATIVE   Bilirubin Urine NEGATIVE NEGATIVE   Ketones, ur 5 (A) NEGATIVE mg/dL   Protein, ur NEGATIVE NEGATIVE mg/dL   Nitrite NEGATIVE NEGATIVE   Leukocytes,Ua TRACE (A) NEGATIVE   RBC / HPF 0-5 0 - 5 RBC/hpf   WBC, UA 0-5 0 - 5 WBC/hpf   Bacteria, UA NONE SEEN NONE SEEN   Squamous Epithelial / LPF 6-10 0 - 5   Mucus PRESENT     Comment: Performed at [facility], [address] [place], [place] [address]  Protime-INR     Status: None   Collection Time: 05/03/19  6:18 AM  Result Value Ref Range   Prothrombin Time 13.6 11.4 - 15.2 seconds   INR 1.1 0.8 - 1.2    Comment: (NOTE)  INR goal varies based on device and disease states. Performed at [facility], [address] [place], [place] [address]   Cortisol-am, blood     Status: None   Collection Time: 05/03/19  6:18 AM  Result Value Ref Range   Cortisol - AM 10.1 6.7 - 22.6 ug/dL    Comment: Performed at [facility], [place] [address],  [place], [place] [address]  Procalcitonin     Status: None   Collection Time: 05/03/19  6:18 AM  Result Value Ref Range   Procalcitonin <0.10 ng/mL    Comment:        Interpretation: PCT (Procalcitonin) <= 0.5 ng/mL: Systemic infection (sepsis) is not likely. Local bacterial infection is possible. (NOTE)       Sepsis PCT Algorithm           Lower Respiratory Tract                                      Infection PCT Algorithm    ----------------------------     ----------------------------         PCT < 0.25 ng/mL                PCT < 0.10 ng/mL         Strongly encourage             Strongly discourage   discontinuation of antibiotics    initiation of antibiotics    ----------------------------     -----------------------------       PCT 0.25 - 0.50 ng/mL            PCT 0.10 - 0.25 ng/mL               OR       >80% decrease in PCT            Discourage initiation of                                            antibiotics      Encourage discontinuation           of antibiotics    ----------------------------     -----------------------------         PCT >= 0.50 ng/mL              PCT 0.26 - 0.50 ng/mL               AND        <80% decrease in PCT             Encourage initiation of                                             antibiotics       Encourage continuation           of antibiotics    ----------------------------     -----------------------------        PCT >= 0.50 ng/mL                  PCT > 0.50 ng/mL  AND         increase in PCT                  Strongly encourage                                      initiation of antibiotics    Strongly encourage escalation           of antibiotics                                     -----------------------------                                           PCT <= 0.25 ng/mL                                                 OR                                        > 80% decrease in PCT                                      Discontinue / Do not initiate                                             antibiotics Performed at [facility], [address] [place], [place] [id number]   Basic metabolic panel     Status: Abnormal   Collection Time: 05/03/19  6:18 AM  Result Value Ref Range   Sodium 139 135 - 145 mmol/L   Potassium 3.9 3.5 - 5.1 mmol/L   Chloride 108 98 - 111 mmol/L   CO2 24 22 - 32 mmol/L   Glucose, Bld 91 70 - 99 mg/dL    Comment: Glucose reference range applies only to samples taken after fasting for at least 8 hours.   BUN 14 6 - 20 mg/dL   Creatinine, Ser 0.66 0.44 - 1.00 mg/dL   Calcium 8.4 (L) 8.9 - 10.3 mg/dL   GFR calc non Af Amer >60 >60 mL/min   GFR calc Af Amer >60 >60 mL/min   Anion gap 7 5 - 15    Comment: Performed at [facility], [address] [place], [place] [address]  CBC     Status: Abnormal   Collection Time: 05/03/19  6:18 AM  Result Value Ref Range   WBC 2.3 (L) 4.0 - 10.5 K/uL   RBC 3.19 (L) 3.87 - 5.11 MIL/uL   Hemoglobin 9.6 (L) 12.0 - 15.0 g/dL   HCT 29.3 (L) 36.0 - 46.0 %   MCV 91.8 80.0 - 100.0 fL   MCH 30.1 26.0 - 34.0 pg   MCHC 32.8 30.0 - 36.0 g/dL  RDW 13.0 11.5 - 15.5 %   Platelets 113 (L) 150 - 400 K/uL    Comment: Immature Platelet Fraction may be clinically indicated, consider ordering this additional test [id number]    nRBC 0.0 0.0 - 0.2 %    Comment: Performed at [facility], [address] [place], [place] [address]  Creatinine, serum     Status: None   Collection Time: 05/04/19  6:10 AM  Result Value Ref Range   Creatinine, Ser 0.72 0.44 - 1.00 mg/dL   GFR calc non Af Amer >60 >60 mL/min   GFR calc Af Amer >60 >60 mL/min    Comment: Performed at [facility], [address] [place], [place] [address]  CBC with Differential/Platelet     Status: Abnormal   Collection Time: 05/04/19  6:10 AM  Result Value Ref Range   WBC 2.3 (L) 4.0 - 10.5 K/uL   RBC 2.78 (L) 3.87 - 5.11 MIL/uL   Hemoglobin 8.3  (L) 12.0 - 15.0 g/dL   HCT 25.4 (L) 36.0 - 46.0 %   MCV 91.4 80.0 - 100.0 fL   MCH 29.9 26.0 - 34.0 pg   MCHC 32.7 30.0 - 36.0 g/dL   RDW 12.8 11.5 - 15.5 %   Platelets 109 (L) 150 - 400 K/uL    Comment: REPEATED TO VERIFY Immature Platelet Fraction may be clinically indicated, consider ordering this additional test [id number] CONSISTENT WITH PREVIOUS RESULT    nRBC 0.0 0.0 - 0.2 %   Neutrophils Relative % 67 %   Neutro Abs 1.5 (L) 1.7 - 7.7 K/uL   Lymphocytes Relative 12 %   Lymphs Abs 0.3 (L) 0.7 - 4.0 K/uL   Monocytes Relative 21 %   Monocytes Absolute 0.5 0.1 - 1.0 K/uL   Eosinophils Relative 0 %   Eosinophils Absolute 0.0 0.0 - 0.5 K/uL   Basophils Relative 0 %   Basophils Absolute 0.0 0.0 - 0.1 K/uL   Immature Granulocytes 0 %   Abs Immature Granulocytes 0.00 0.00 - 0.07 K/uL    Comment: Performed at [facility], [address] [place], [place] [address]   DG Chest Port 1 View  Result Date: 05/02/2019 CLINICAL DATA:  Fevers, history of throat carcinoma EXAM: PORTABLE CHEST 1 VIEW COMPARISON:  12/21/2018 plain film, CT from 04/02/2019 FINDINGS: Cardiac shadow is within normal limits. Aortic calcifications are noted. Right chest wall port is noted with the catheter tip in the proximal superior vena cava. No focal infiltrate or effusion is seen. No bony abnormality is noted. IMPRESSION: No acute abnormality seen. Electronically Signed   By: Inez Catalina M.D.   On: 05/02/2019 20:49    Assessment:  The patient is a 59 y.o. woman with clinical stage I base of tongue carcinoma currently day 15 s/p cycle #2 cisplatin with concurrent radiation.  She was admitted with odynophagia and a low grade fever.  She was empirically started on Cefepime, vancomycin, and Flagyl.  Cultures are negative to date.  Symptomatically, she notes a burning pain on swallowing.  She is interested in trying liquids and cereal this morning.  Plan:   1.  Stage I base of tongue carcinoma  She is  currently day 15 of cycle #2 cisplatin.  She received radiation yesterday.  Etiology of her throat pain likely secondary to radiation.   Discussed with Dr Baruch Gouty- consider break in therapy (radiation on hold now).    Dr Baruch Gouty recommends Decadron 4 mg po BID. 2.  Pancytopenia  Hematocrit 25.4.  Hemoglobin 8.3.  WBC 2300 ([place] 1500).  Platelets 109,000.  Etiology secondary to chemotherapy.  Anticipate count recovery over the next few days (monocyte count elevated).  Patient on Lovenox for DVT prophylaxis.  Monitor CBC daily while hospitalized. 3.   Low grade fever  Temperature curve has remained low (99.2).  Patient currently on Cefepime, vancomycin, and Flagyl.  Cultures negative to date.  Consider discontinuation of broad spectrum IV antibiotics. 4.  Odynophagia  Pain fairly well controlled with oral and IV morphine.  Adjust pain medications as needed. 5.  Fluids, electrolytes and nutrition  Patient on IVF.  She has had little by mouth.  She would like to try fluids, Ensure, and cereal this morning.  Patient will need to document ability to maintain hydration by mouth. 6.   Disposition  Patient interested in going home.  However, need to ensure adequate hydration by mouth and pain controlled on oral medications  Thank you for allowing me to participate in Connie Cameron 's care.  I will follow her closely with you while hospitalized and after discharge in the outpatient department.   Lequita Asal, MD  05/04/2019, 8:24 AM

## 2019-05-04 NOTE — Progress Notes (Signed)
PROGRESS NOTE    MKENZIE TACKER  [id number] DOB: 06-15-1960 DOA: 05/02/2019 PCP: Patient, No Pcp Per   Brief Narrative:   59 y.o. Caucasian female with a known history of throat cancer status post surgical resection, on daily radiotherapy and on chemotherapy since February, who presented to the emergency room with acute onset of fever of 100.5 today with associated sore throat that started on Saturday night with inability to drink fluids today secondary to pain.  She called her oncologist who recommended her come in to the ER.  She denies any chills.  No nausea or vomiting or abdominal pain.  No cough or wheezing.  No dysuria or hematuria or flank pain.  She admits to oliguria with decreased p.o. intake. Her last chemotherapy session was about a week ago.  Upon presentation to the emergency room, blood pressure was 158/94 with otherwise normal vital signs.  Labs revealed normal CMP and CBC showed anemia close to previous levels.  Lactic acid was 1.1 and procalcitonin was less than 0.1.  Blood cultures were drawn.  Portable chest x-ray showed no acute cardiopulmonary disease. EKG showed normal sinus rhythm with a rate of 92 with low voltage QRS and poor R wave progression.  The patient was given IV cefepime, vancomycin and Flagyl as well as 4 mg of IV morphine sulfate, and later IV normal saline bolus and 5 mm of Magic mouthwash.  The patient will be admitted to an observation medical monitored bed for further evaluation and management.  3/29: Patient seen and examined.  Remains in the emergency department.  Was transported to radiation from the ED.  Continues to endorse severe dysphagia and a odynophagia.  Etiology unclear.  Unclear if there is an infectious focus.  3/30: Patient seen and examined.  Transfer to the floor.  All cultures remain negative.  Remains on broad-spectrum antibiotics.  Dysphagia mildly improved.  Seen by oncology.  Etiology likely attributable to radiotherapy.  Oncology  recommending Decadron 4 mg p.o. twice daily.  Initiated.   Assessment & Plan:   Active Problems:   Acute pharyngitis   Bronchitis  Acute pharyngitis Odynophagia Decreased p.o. intake Patient had a acute change in her ability to swallow Thus far prescribed pain medications have been inadequate in controlling symptoms Patient started on empiric broad-spectrum antibiotics on admission All cultures no growth to date, negative procalcitonin Low suspicion for acute infection Plan: DC broad-spectrum antibiotics Multimodal pain regimen Scheduled Toradol Offered GI cocktail, patient states she cannot tolerate As needed Cepacol lozenge Decadron 4 mg p.o. twice daily per oncology recs Supplemental IV fluids  Throat cancer status post surgical resection Currently on chemotherapy and radiation Dr. Janese Banks aware patient admission For last oncology note radiation is on hold currently given recent odynophagia  GERD On Carafate   DVT prophylaxis: Lovenox Code Status: Full Family Communication: None today Disposition Plan: Anticipate return to previous home environment within 24 to 48 hours.  Patient will need improvement in her odynophagia and increased ability to tolerate p.o. currently p.o. intake is severely limited.  Patient is dependent on IV fluids.  Decadron started today.  Consider discharge tomorrow if p.o. intake improves   Consultants:   Oncology  Procedures:   None  Antimicrobials:   Vancomycin (05/02/2019-05/04/2019)  Cefepime (05/02/2019-05/04/2019)  Flagyl (05/02/2019-05/04/2019)   Subjective: Seen and examined Still endorsing dysphagia and odynophagia No fevers noted over interval  Objective: Vitals:   05/03/19 1932 05/03/19 2319 05/04/19 0758 05/04/19 1154  BP: 129/79 113/66 136/76 (!) 151/83  Pulse: 72 79 72 66  Resp: 17 17 18 18   Temp: 98.7 F (37.1 C) 98.7 F (37.1 C) 98.4 F (36.9 C) 98.6 F (37 C)  TempSrc: Oral Oral Oral Oral  SpO2: 96% 97% 97%  98%  Weight:      Height:        Intake/Output Summary (Last 24 hours) at 05/04/2019 1208 Last data filed at 05/04/2019 [id number] Gross per 24 hour  Intake 2608.37 ml  Output 200 ml  Net 2408.37 ml   Filed Weights   05/02/19 2011  Weight: 78.5 kg    Examination:  General exam: Appears calm and comfortable HEENT: Oropharynx no thrush or significant pharyngeal edema Bilateral submandibular tenderness, worse on the left Respiratory system: Clear to auscultation. Respiratory effort normal. Cardiovascular system: S1 & S2 heard, RRR. No JVD, murmurs, rubs, gallops or clicks. No pedal edema. Gastrointestinal system: Abdomen is nondistended, soft and nontender. No organomegaly or masses felt. Normal bowel sounds heard. Central nervous system: Alert and oriented. No focal neurological deficits. Extremities: Symmetric 5 x 5 power. Skin: No rashes, lesions or ulcers Psychiatry: Judgement and insight appear normal. Mood & affect appropriate.     Data Reviewed: I have personally reviewed following labs and imaging studies  CBC: Recent Labs  Lab 05/02/19 2034 05/03/19 0618 05/04/19 0610  WBC 4.2 2.3* 2.3*  NEUTROABS 3.3  --  1.5*  HGB 10.2* 9.6* 8.3*  HCT 31.1* 29.3* 25.4*  MCV 90.9 91.8 91.4  PLT 126* 113* 0000000*   Basic Metabolic Panel: Recent Labs  Lab 05/02/19 2034 05/03/19 0618 05/04/19 0610  NA 137 139  --   K 4.0 3.9  --   CL 104 108  --   CO2 23 24  --   GLUCOSE 109* 91  --   BUN 16 14  --   CREATININE 0.72 0.66 0.72  CALCIUM 9.2 8.4*  --    GFR: Estimated Creatinine Clearance: 76 mL/min (by C-G formula based on SCr of 0.72 mg/dL). Liver Function Tests: Recent Labs  Lab 05/02/19 2034  AST 32  ALT 37  ALKPHOS 62  BILITOT 0.9  PROT 7.1  ALBUMIN 4.2   No results for input(s): LIPASE, AMYLASE in the last 168 hours. No results for input(s): AMMONIA in the last 168 hours. Coagulation Profile: Recent Labs  Lab 05/03/19 0618  INR 1.1   Cardiac Enzymes: No  results for input(s): CKTOTAL, CKMB, CKMBINDEX, TROPONINI in the last 168 hours. BNP (last 3 results) No results for input(s): PROBNP in the last 8760 hours. HbA1C: No results for input(s): HGBA1C in the last 72 hours. CBG: No results for input(s): GLUCAP in the last 168 hours. Lipid Profile: No results for input(s): CHOL, HDL, LDLCALC, TRIG, CHOLHDL, LDLDIRECT in the last 72 hours. Thyroid Function Tests: No results for input(s): TSH, T4TOTAL, FREET4, T3FREE, THYROIDAB in the last 72 hours. Anemia Panel: No results for input(s): VITAMINB12, FOLATE, FERRITIN, TIBC, IRON, RETICCTPCT in the last 72 hours. Sepsis Labs: Recent Labs  Lab 05/02/19 2034 05/03/19 0618  PROCALCITON <0.10 <0.10  LATICACIDVEN 1.1  --     Recent Results (from the past 240 hour(s))  Blood Culture (routine x 2)     Status: None (Preliminary result)   Collection Time: 05/02/19  8:34 PM   Specimen: BLOOD  Result Value Ref Range Status   Specimen Description BLOOD RIGHT FA  Final   Special Requests   Final    BOTTLES DRAWN AEROBIC AND ANAEROBIC Blood Culture adequate volume  Culture   Final    NO GROWTH 2 DAYS Performed at [facility], [address] [place], [place] [address]    Report Status PENDING  Incomplete  Blood Culture (routine x 2)     Status: None (Preliminary result)   Collection Time: 05/02/19  8:34 PM   Specimen: BLOOD  Result Value Ref Range Status   Specimen Description BLOOD RIGHT HAND  Final   Special Requests   Final    BOTTLES DRAWN AEROBIC AND ANAEROBIC Blood Culture results may not be optimal due to an inadequate volume of blood received in culture bottles   Culture   Final    NO GROWTH 2 DAYS Performed at [facility], [address], [place], [place] [address]    Report Status PENDING  Incomplete         Radiology Studies: DG Chest Port 1 View  Result Date: 05/02/2019 CLINICAL DATA:  Fevers, history of throat carcinoma EXAM: PORTABLE CHEST 1 VIEW  COMPARISON:  12/21/2018 plain film, CT from 04/02/2019 FINDINGS: Cardiac shadow is within normal limits. Aortic calcifications are noted. Right chest wall port is noted with the catheter tip in the proximal superior vena cava. No focal infiltrate or effusion is seen. No bony abnormality is noted. IMPRESSION: No acute abnormality seen. Electronically Signed   By: Inez Catalina M.D.   On: 05/02/2019 20:49        Scheduled Meds: . dexamethasone  4 mg Oral BID  . enoxaparin (LOVENOX) injection  40 mg Subcutaneous Q24H  . ketorolac  15 mg Intravenous Q6H  . magic mouthwash  5 mL Oral Once  . sucralfate  1 g Oral TID AC   Continuous Infusions: . sodium chloride 100 mL/hr at 05/04/19 0317     LOS: 1 day    Time spent: 35 minutes    Sidney Ace, MD [facility] Pager [phone]  If 7PM-7AM, please contact night-coverage  05/04/2019, 12:08 PM

## 2019-05-04 NOTE — TOC Progression Note (Signed)
Transition of Care [facility]) - Progression Note    Patient Details  Name: DOROTHEE STEM MRN: [id number] Date of Birth: [birth year]  Transition of Care [facility]) CM/SW Contact  Brogan England, Gardiner Rhyme, LCSW Phone Number: 05/04/2019, 3:06 PM  Clinical Narrative:   Pt reports feeling better today and oncologist is letting her take a break this week from radiation treatments. She haas eaten and drank more so hopefully this will continue. She is concerned when it starts again on Monday the same thing will happen. Pt is keeping her head up. Smiling today.    Expected Discharge Plan: Home/Self Care Barriers to Discharge: Continued Medical Work up  Expected Discharge Plan and Services Expected Discharge Plan: Home/Self Care In-house Referral: Clinical Social Work     Living arrangements for the past 2 months: Single Family Home                                       Social Determinants of Health (SDOH) Interventions    Readmission Risk Interventions No flowsheet data found.

## 2019-05-05 ENCOUNTER — Ambulatory Visit: Payer: Medicaid Other

## 2019-05-05 DIAGNOSIS — R070 Pain in throat: Secondary | ICD-10-CM

## 2019-05-05 LAB — CBC WITH DIFFERENTIAL/PLATELET
Abs Immature Granulocytes: 0.02 10*3/uL (ref 0.00–0.07)
Basophils Absolute: 0 10*3/uL (ref 0.0–0.1)
Basophils Relative: 0 %
Eosinophils Absolute: 0 10*3/uL (ref 0.0–0.5)
Eosinophils Relative: 0 %
HCT: 28.5 % — ABNORMAL LOW (ref 36.0–46.0)
Hemoglobin: 9.7 g/dL — ABNORMAL LOW (ref 12.0–15.0)
Immature Granulocytes: 1 %
Lymphocytes Relative: 9 %
Lymphs Abs: 0.3 10*3/uL — ABNORMAL LOW (ref 0.7–4.0)
MCH: 29.9 pg (ref 26.0–34.0)
MCHC: 34 g/dL (ref 30.0–36.0)
MCV: 88 fL (ref 80.0–100.0)
Monocytes Absolute: 0.1 10*3/uL (ref 0.1–1.0)
Monocytes Relative: 3 %
Neutro Abs: 2.6 10*3/uL (ref 1.7–7.7)
Neutrophils Relative %: 87 %
Platelets: 143 10*3/uL — ABNORMAL LOW (ref 150–400)
RBC: 3.24 MIL/uL — ABNORMAL LOW (ref 3.87–5.11)
RDW: 12.8 % (ref 11.5–15.5)
WBC: 3 10*3/uL — ABNORMAL LOW (ref 4.0–10.5)
nRBC: 0 % (ref 0.0–0.2)

## 2019-05-05 MED ORDER — DEXAMETHASONE 4 MG PO TABS: 4.0000 mg | ORAL_TABLET | Freq: Two times a day (BID) | ORAL | 0 refills | Status: DC

## 2019-05-05 MED ORDER — FAMOTIDINE 20 MG PO TABS: 20.0000 mg | ORAL_TABLET | Freq: Every day | ORAL | 0 refills | Status: DC

## 2019-05-05 NOTE — Discharge Summary (Signed)
Connie Cameron [id number] DOB: 1960-05-12 DOA: 05/02/2019  PCP: Patient, No Pcp Per  Admit date: 05/02/2019 Discharge date: 05/05/2019  Admitted From: Home Disposition: Home  Recommendations for Outpatient Follow-up:  1. Follow up with PCP in 3 days 2. Please obtain BMP/CBC in 3 days     Discharge Condition:Stable CODE STATUS: Full Diet recommendation: Heart Healthy Brief/Interim Summary: Connie Cameron  is a 59 y.o. Caucasian female with a known history of throat cancer status post surgical resection, on daily radiotherapy and on chemotherapy since February, who presented to the emergency room with acute onset of fever of 100.5 today with associated sore throat that started on Saturday night with inability to drink fluids today secondary to pain.  She called her oncologist who recommended her come in to the ER.  She denied any chills.  No nausea or vomiting or abdominal pain.  No cough or wheezing.  No dysuria or hematuria or flank pain.  She admited to oliguria with decreased p.o. intake. Her last chemotherapy session was about a week ago.Lactic acid was 1.1 and procalcitonin was less than 0.1.Portable chest x-ray showed no acute cardiopulmonary disease.  Oncology was consulted.  She was started on IV fluids.  She was started on IV antibiotics.  Oncology recommended discontinuing broad-spectrum IV antibiotics.  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  They thought the etiology of her throat pain was likely secondary to radiation.  She was started on Decadron 4 mg p.o. twice daily per oncology's recommendation.  Pain control.  GI cocktail was started.  She was slowly started on p.o. intake which she tolerated soft diet and had no issues.  Oncology was okay with her being discharged home with follow-up with them on Monday.  Oncology also recommended to me patient should be discharged on current Decadron dose.  Home with pancytopenia which is likely secondary to  chemotherapy and her numbers actually improved today.  She is remained afebrile.   Discharge Diagnoses:  Active Problems:   Acute pharyngitis   Bronchitis    Discharge Instructions  Discharge Instructions    Call MD for:  temperature >100.4   Complete by: As directed    Diet - low sodium heart healthy   Complete by: As directed    Discharge instructions   Complete by: As directed    Eat soft food as tolerated, advance as tolerated. Take decadron twice a day dose until you see your oncologist on Monday, if any issues call and see earlier. Discuss what dose to take after being see by your oncologist.   Increase activity slowly   Complete by: As directed      Allergies as of 05/05/2019   No Known Allergies     Medication List    TAKE these medications   dexamethasone 4 MG tablet Commonly known as: DECADRON Take 1 tablet (4 mg total) by mouth 2 (two) times daily. Take this dose until you follow up with your oncologist What changed:   how much to take  how to take this  when to take this  additional instructions   famotidine 20 MG tablet Commonly known as: PEPCID Take 1 tablet (20 mg total) by mouth daily.   lidocaine-prilocaine cream Commonly known as: EMLA Apply to affected area once   LORazepam 0.5 MG tablet Commonly known as: Ativan Take 1 tablet (0.5 mg total) by mouth every 6 (six) hours as needed (Nausea or vomiting).   magic mouthwash w/lidocaine Soln Take 5  mLs by mouth 4 (four) times daily as needed for mouth pain.   morphine CONCENTRATE 10 mg / 0.5 ml concentrated solution Take 0.5 mLs (10 mg total) by mouth every 4 (four) hours as needed for severe pain.   ondansetron 8 MG tablet Commonly known as: Zofran Take 1 tablet (8 mg total) by mouth 2 (two) times daily as needed. Start on the third day after chemotherapy.   sucralfate 1 g tablet Commonly known as: Carafate Take 1 tablet (1 g total) by mouth 3 (three) times daily before meals.  Dissolve tablet in warm water, swish and swallow.      Follow-up Information    Morton Peters., MD Follow up.   Specialty: Family Medicine Contact information: [place] [address] [phone]        Lequita Asal, MD Follow up in 3 day(s).   Specialty: Hematology and Oncology Contact information: [place] [address] [phone]          No Known Allergies  Consultations:   Oncology  Procedures/Studies: DG Chest Port 1 View  Result Date: 05/02/2019 CLINICAL DATA:  Fevers, history of throat carcinoma EXAM: PORTABLE CHEST 1 VIEW COMPARISON:  12/21/2018 plain film, CT from 04/02/2019 FINDINGS: Cardiac shadow is within normal limits. Aortic calcifications are noted. Right chest wall port is noted with the catheter tip in the proximal superior vena cava. No focal infiltrate or effusion is seen. No bony abnormality is noted. IMPRESSION: No acute abnormality seen. Electronically Signed   By: Inez Catalina M.D.   On: 05/02/2019 20:49       Subjective: Has no complaints.  Tolerated p.o. intake.  Discharge Exam: Vitals:   05/04/19 2354 05/05/19 0822  BP: (!) 143/79 (!) 151/86  Pulse: 65 (!) 59  Resp: 16 20  Temp: 98.3 F (36.8 C) 98.2 F (36.8 C)  SpO2: 98% 98%   Vitals:   05/04/19 1154 05/04/19 1618 05/04/19 2354 05/05/19 0822  BP: (!) 151/83 (!) 152/84 (!) 143/79 (!) 151/86  Pulse: 66 72 65 (!) 59  Resp: 18 20 16 20   Temp: 98.6 F (37 C) 98.7 F (37.1 C) 98.3 F (36.8 C) 98.2 F (36.8 C)  TempSrc: Oral  Oral Oral  SpO2: 98% 98% 98% 98%  Weight:      Height:        General: Pt is alert, awake, not in acute distress Cardiovascular: RRR, S1/S2 +, no rubs, no gallops Respiratory: CTA bilaterally, no wheezing, no rhonchi Abdominal: Soft, NT, ND, bowel sounds + Extremities: no edema, no cyanosis    The results of significant diagnostics from this hospitalization (including imaging, microbiology,  ancillary and laboratory) are listed below for reference.     Microbiology: Recent Results (from the past 240 hour(s))  Blood Culture (routine x 2)     Status: None (Preliminary result)   Collection Time: 05/02/19  8:34 PM   Specimen: BLOOD  Result Value Ref Range Status   Specimen Description BLOOD RIGHT FA  Final   Special Requests   Final    BOTTLES DRAWN AEROBIC AND ANAEROBIC Blood Culture adequate volume   Culture   Final    NO GROWTH 3 DAYS Performed at [facility], [address], [place], [place] [address]    Report Status PENDING  Incomplete  Blood Culture (routine x 2)     Status: None (Preliminary result)   Collection Time: 05/02/19  8:34 PM   Specimen: BLOOD  Result Value Ref Range Status   Specimen Description BLOOD RIGHT HAND  Final   Special Requests   Final    BOTTLES DRAWN AEROBIC AND ANAEROBIC Blood Culture results may not be optimal due to an inadequate volume of blood received in culture bottles   Culture   Final    NO GROWTH 3 DAYS Performed at [facility], [address], [place], [place] [address]    Report Status PENDING  Incomplete  Urine culture     Status: None   Collection Time: 05/02/19  8:34 PM   Specimen: In/Out Cath Urine  Result Value Ref Range Status   Specimen Description   Final    IN/OUT CATH URINE Performed at [facility], [address] [place], [place] [address]    Special Requests   Final    NONE Performed at [facility], [address], [place], [place] [address]    Culture   Final    NO GROWTH Performed at [facility], [place] [address] [place], [place] [address]    Report Status 05/04/2019 FINAL  Final     Labs: BNP (last 3 results) No results for input(s): BNP in the last 8760 hours. Basic Metabolic Panel: Recent Labs  Lab 05/02/19 2034 05/03/19 0618 05/04/19 0610  NA 137 139  --   K 4.0 3.9  --   CL 104 108  --   CO2 23 24  --   GLUCOSE 109* 91  --   BUN 16 14   --   CREATININE 0.72 0.66 0.72  CALCIUM 9.2 8.4*  --    Liver Function Tests: Recent Labs  Lab 05/02/19 2034  AST 32  ALT 37  ALKPHOS 62  BILITOT 0.9  PROT 7.1  ALBUMIN 4.2   No results for input(s): LIPASE, AMYLASE in the last 168 hours. No results for input(s): AMMONIA in the last 168 hours. CBC: Recent Labs  Lab 05/02/19 2034 05/03/19 0618 05/04/19 0610 05/05/19 0834  WBC 4.2 2.3* 2.3* 3.0*  NEUTROABS 3.3  --  1.5* 2.6  HGB 10.2* 9.6* 8.3* 9.7*  HCT 31.1* 29.3* 25.4* 28.5*  MCV 90.9 91.8 91.4 88.0  PLT 126* 113* 109* 143*   Cardiac Enzymes: No results for input(s): CKTOTAL, CKMB, CKMBINDEX, TROPONINI in the last 168 hours. BNP: Invalid input(s): POCBNP CBG: No results for input(s): GLUCAP in the last 168 hours. D-Dimer No results for input(s): DDIMER in the last 72 hours. Hgb A1c No results for input(s): HGBA1C in the last 72 hours. Lipid Profile No results for input(s): CHOL, HDL, LDLCALC, TRIG, CHOLHDL, LDLDIRECT in the last 72 hours. Thyroid function studies No results for input(s): TSH, T4TOTAL, T3FREE, THYROIDAB in the last 72 hours.  Invalid input(s): FREET3 Anemia work up No results for input(s): VITAMINB12, FOLATE, FERRITIN, TIBC, IRON, RETICCTPCT in the last 72 hours. Urinalysis    Component Value Date/Time   COLORURINE YELLOW (A) 05/02/2019 2034   APPEARANCEUR HAZY (A) 05/02/2019 2034   APPEARANCEUR Hazy 06/24/2011 1709   LABSPEC 1.020 05/02/2019 2034   LABSPEC 1.010 06/24/2011 1709   PHURINE 5.0 05/02/2019 2034   GLUCOSEU NEGATIVE 05/02/2019 2034   GLUCOSEU Negative 06/24/2011 1709   HGBUR NEGATIVE 05/02/2019 2034   BILIRUBINUR NEGATIVE 05/02/2019 2034   BILIRUBINUR Negative 06/24/2011 1709   KETONESUR 5 (A) 05/02/2019 2034   PROTEINUR NEGATIVE 05/02/2019 2034   NITRITE NEGATIVE 05/02/2019 2034   LEUKOCYTESUR TRACE (A) 05/02/2019 2034   LEUKOCYTESUR 1+ 06/24/2011 1709  Sepsis Labs Invalid input(s): PROCALCITONIN,  WBC,   LACTICIDVEN Microbiology Recent Results (from the past 240 hour(s))  Blood Culture (routine x 2)     Status: None (Preliminary result)   Collection Time: 05/02/19  8:34 PM   Specimen: BLOOD  Result Value Ref Range Status   Specimen Description BLOOD RIGHT FA  Final   Special Requests   Final    BOTTLES DRAWN AEROBIC AND ANAEROBIC Blood Culture adequate volume   Culture   Final    NO GROWTH 3 DAYS Performed at [facility], [address], [place], [place] [address]    Report Status PENDING  Incomplete  Blood Culture (routine x 2)     Status: None (Preliminary result)   Collection Time: 05/02/19  8:34 PM   Specimen: BLOOD  Result Value Ref Range Status   Specimen Description BLOOD RIGHT HAND  Final   Special Requests   Final    BOTTLES DRAWN AEROBIC AND ANAEROBIC Blood Culture results may not be optimal due to an inadequate volume of blood received in culture bottles   Culture   Final    NO GROWTH 3 DAYS Performed at [facility], [address] [place], [place] [address]    Report Status PENDING  Incomplete  Urine culture     Status: None   Collection Time: 05/02/19  8:34 PM   Specimen: In/Out Cath Urine  Result Value Ref Range Status   Specimen Description   Final    IN/OUT CATH URINE Performed at [facility], [address], [place], [place] [address]    Special Requests   Final    NONE Performed at [facility], [address] [place], [place] [address]    Culture   Final    NO GROWTH Performed at [facility], [place] [address] [place], [place] [address]    Report Status 05/04/2019 FINAL  Final  Acute pharyngitis Odynophagia Decreased p.o. intake Patient had a acute change in her ability to swallow Thus far prescribed pain medications have been inadequate in controlling symptoms Patient started on empiric broad-spectrum antibiotics on admission All cultures no growth to date, negative procalcitonin Low suspicion for  acute infection Throat pain 2/2 radiation therapy abx d/c'd  Throat cancer status post surgical resection Currently on chemotherapy and radiationn For last oncology note radiation is on hold currently given recent odynophagia  GERD On Carafate    Time coordinating discharge: Over 30 minutes  SIGNED:   Nolberto Hanlon, MD  [facility] 05/05/2019, 12:55 PM Pager   If 7PM-7AM, please contact night-coverage [email or web address] Password [email or web address]

## 2019-05-05 NOTE — Plan of Care (Signed)
  Problem: Nutrition: Goal: Adequate nutrition will be maintained Outcome: Progressing Patient able to consume soft foods provided at breakfast.    Problem: Pain Managment: Goal: General experience of comfort will improve Outcome: Progressing

## 2019-05-05 NOTE — Progress Notes (Signed)
[facility] Hematology/Oncology Progress Note  Date of admission: 05/02/2019  Hospital day:  05/05/2019  Chief Complaint: Connie Cameron is a 59 y.o. female with stage I base of tongue carcinoma currently day 16 s/p cycle #2 cisplatin with concurrent radiation who was admitted through the emergency room with difficulty swallowing and a low grade fever.  Subjective: She is feeling much better.  She was able to eat mashed potatoes, macaroni and cheese and yogurt yesterday.  She is drinking fluids.  Pain is controlled with oral morphine.  Social History: The patient is alone today.  Allergies: No Known Allergies  Scheduled Medications: . dexamethasone  4 mg Oral BID  . enoxaparin (LOVENOX) injection  40 mg Subcutaneous Q24H  . ketorolac  15 mg Intravenous Q6H  . magic mouthwash  5 mL Oral Once  . sucralfate  1 g Oral TID AC    Review of Systems: GENERAL:  Feels better.  No fever. PERFORMANCE STATUS (ECOG):  1 HEENT:  Sore throat, improved.  Denies mouth sores.  Tinnitus, improved.  No visual changes or unny nose. Lungs: No shortness of breath or cough.  No hemoptysis. Cardiac:  No chest pain, palpitations, orthopnea, or PND. GI:  Odynophagia, controlled.  No nausea, vomiting, diarrhea, constipation, melena or hematochezia. GU:  No urgency, frequency, dysuria, or hematuria. Musculoskeletal:  No back pain.  No joint pain.  No muscle tenderness. Extremities:  No pain or swelling. Skin:  No rashes or skin changes. Neuro:  No headache, numbness or weakness, balance or coordination issues. Endocrine:  No diabetes, thyroid issues, hot flashes or night sweats. Psych:  No mood changes, depression or anxiety. Pain:  Controlled. Review of systems:  All other systems reviewed and found to be negative.  Physical Exam: Blood pressure (!) 143/79, pulse 65, temperature 98.3 F (36.8 C), temperature source Oral, resp. rate 16, height 5\' 3"  (1.6 m), weight 173 lb (78.5 kg),  SpO2 98 %.  GENERAL:  Well developed, well nourished, woman lying comfortably on the medical unit in no acute distress. MENTAL STATUS:  Alert and oriented to person, place and time. HEAD:  Shoulder length hair.  Normocephalic, atraumatic, face symmetric, no Cushingoid features. EYES:  Pupils equal round and reactive to light and accomodation.  No conjunctivitis or scleral icterus. ENT:  Oropharynx clear without lesion.  Tongue slightly white without thush. Mucous membranes moist.  RESPIRATORY:  Clear to auscultation without rales, wheezes or rhonchi. CARDIOVASCULAR:  Regular rate and rhythm without murmur, rub or gallop. CHEST WALL;  Right sided port-a-cath. ABDOMEN:  Soft, non-tender, with active bowel sounds, and no hepatosplenomegaly.  No masses. SKIN:  No rashes, ulcers or lesions. EXTREMITIES: No edema, no skin discoloration or tenderness.  No palpable cords. NEUROLOGICAL: Unremarkable. PSYCH:  Appropriate.  Results for orders placed or performed during the hospital encounter of 05/02/19 (from the past 48 hour(s))  Creatinine, serum     Status: None   Collection Time: 05/04/19  6:10 AM  Result Value Ref Range   Creatinine, Ser 0.72 0.44 - 1.00 mg/dL   GFR calc non Af Amer >60 >60 mL/min   GFR calc Af Amer >60 >60 mL/min    Comment: Performed at [facility], [address] [place], [place] [address]  CBC with Differential/Platelet     Status: Abnormal   Collection Time: 05/04/19  6:10 AM  Result Value Ref Range   WBC 2.3 (L) 4.0 - 10.5 K/uL   RBC 2.78 (L) 3.87 - 5.11 MIL/uL  Hemoglobin 8.3 (L) 12.0 - 15.0 g/dL   HCT 25.4 (L) 36.0 - 46.0 %   MCV 91.4 80.0 - 100.0 fL   MCH 29.9 26.0 - 34.0 pg   MCHC 32.7 30.0 - 36.0 g/dL   RDW 12.8 11.5 - 15.5 %   Platelets 109 (L) 150 - 400 K/uL    Comment: REPEATED TO VERIFY Immature Platelet Fraction may be clinically indicated, consider ordering this additional test [id number] CONSISTENT WITH PREVIOUS RESULT    nRBC 0.0 0.0  - 0.2 %   Neutrophils Relative % 67 %   Neutro Abs 1.5 (L) 1.7 - 7.7 K/uL   Lymphocytes Relative 12 %   Lymphs Abs 0.3 (L) 0.7 - 4.0 K/uL   Monocytes Relative 21 %   Monocytes Absolute 0.5 0.1 - 1.0 K/uL   Eosinophils Relative 0 %   Eosinophils Absolute 0.0 0.0 - 0.5 K/uL   Basophils Relative 0 %   Basophils Absolute 0.0 0.0 - 0.1 K/uL   Immature Granulocytes 0 %   Abs Immature Granulocytes 0.00 0.00 - 0.07 K/uL    Comment: Performed at [facility], [address] [place], [place] [address]   No results found.  Assessment:  Connie Cameron is a 59 y.o. female with with clinical stage I base of tongue carcinoma currently day 15 s/p cycle #2 cisplatin with concurrent radiation.  She was admitted with odynophagia and a low grade fever.  She was empirically started on Cefepime, vancomycin, and Flagyl.  Cultures are negative to date.  Antibiotics discontinued yesterday.  Symptomatically, she is doing well.  She is able to eat soft fluids and drink fluids.  Exam is stable.  Plan:   1.  Stage I base of tongue carcinoma             She is currently day 16 of cycle #2 cisplatin.             Radiation on hold with reassessment next week.   Patient started on Decadron BID.             Discuss plan for possible reinitiation of therapy next week.   Tinnitus improved.  Patient with high frequency hearing loss.   Patient wishes to continue cisplatin.    Discuss consideration of weekly cisplatin x 3. 2.   Pancytopenia             CBC pending today.  Hematocrit 25.4.  Hemoglobin 8.3.  WBC 2300 ([place] 1500).  Platelets 109,000 yesterday.             Etiology secondary to chemotherapy.             Anticipate count recovery over the next few days (monocyte count elevated).             Patient on Lovenox for DVT prophylaxis. 3.   Low grade fever             Patient afebrile on steroids.             Cefepime, vancomycin, and Flagyl discontinued yesterday.             Cultures negative  to date. 4.  Odynophagia             Pain well controlled with oral morphine.             Patient started on Decadron BID yesterday.   Continue after discharge. 5.  Fluids, electrolytes and nutrition  Patient on NS 100 cc/hr.  She is now drinking fluids and eating soft foods. 6.   Disposition             Anticipate discharge today.   Lequita Asal, MD  05/05/2019, 8:08 AM

## 2019-05-05 NOTE — Progress Notes (Signed)
Discussed AVS including medications & need for follow up appointments with the patient and all questioned fully answered.  

## 2019-05-06 ENCOUNTER — Inpatient Hospital Stay: Payer: Medicaid Other | Attending: Oncology

## 2019-05-06 ENCOUNTER — Ambulatory Visit: Payer: Medicaid Other

## 2019-05-06 DIAGNOSIS — K208 Other esophagitis without bleeding: Secondary | ICD-10-CM | POA: Insufficient documentation

## 2019-05-06 DIAGNOSIS — R5383 Other fatigue: Secondary | ICD-10-CM | POA: Insufficient documentation

## 2019-05-06 DIAGNOSIS — D72819 Decreased white blood cell count, unspecified: Secondary | ICD-10-CM | POA: Insufficient documentation

## 2019-05-06 DIAGNOSIS — N390 Urinary tract infection, site not specified: Secondary | ICD-10-CM | POA: Insufficient documentation

## 2019-05-06 DIAGNOSIS — Z5111 Encounter for antineoplastic chemotherapy: Secondary | ICD-10-CM | POA: Insufficient documentation

## 2019-05-06 DIAGNOSIS — R531 Weakness: Secondary | ICD-10-CM | POA: Insufficient documentation

## 2019-05-06 DIAGNOSIS — K1233 Oral mucositis (ulcerative) due to radiation: Secondary | ICD-10-CM | POA: Insufficient documentation

## 2019-05-06 DIAGNOSIS — Y842 Radiological procedure and radiotherapy as the cause of abnormal reaction of the patient, or of later complication, without mention of misadventure at the time of the procedure: Secondary | ICD-10-CM | POA: Insufficient documentation

## 2019-05-06 DIAGNOSIS — H9042 Sensorineural hearing loss, unilateral, left ear, with unrestricted hearing on the contralateral side: Secondary | ICD-10-CM | POA: Insufficient documentation

## 2019-05-06 DIAGNOSIS — Z79899 Other long term (current) drug therapy: Secondary | ICD-10-CM | POA: Insufficient documentation

## 2019-05-06 DIAGNOSIS — E876 Hypokalemia: Secondary | ICD-10-CM | POA: Insufficient documentation

## 2019-05-06 DIAGNOSIS — T451X5A Adverse effect of antineoplastic and immunosuppressive drugs, initial encounter: Secondary | ICD-10-CM | POA: Insufficient documentation

## 2019-05-06 DIAGNOSIS — C01 Malignant neoplasm of base of tongue: Secondary | ICD-10-CM | POA: Insufficient documentation

## 2019-05-06 DIAGNOSIS — Z7952 Long term (current) use of systemic steroids: Secondary | ICD-10-CM | POA: Insufficient documentation

## 2019-05-06 NOTE — Progress Notes (Signed)
Nutrition Follow-up:  Patient with base of the tongue cancer.  Patient receiving radiation and chemotherapy.  Followed by Dr Colbert Coyer.  Spoke with patient via phone as radiation treatments have been cancelled for this week due to recent hospital admission.  Admission due to fever, painful swallowing.  Patient reports that she is feeling better.  Last night was able to blend some broccoli and rice.  She is drinking ensure and water.  Has been taking liquid morphine but not every 4 hours but every 6-8 hours.  "I am scared of that stuff."  Reports that she has been able to take yogurt, mashed potatoes and cereal soaked in milk.  Patient reports that she is going to experiment with blending foods today as she does not want to go back to the hospital    Medications: morphine, carafate  Labs: reviewed  Anthropometrics:   Weight 3/28 173 lb (hospital admission)   3/23 173 lb 8 oz per [facility] 180 lb 6 oz on 3/9 per [facility] 184 lb on 02/22/19  NUTRITION DIAGNOSIS: Predicted suboptimal energy intake continues   INTERVENTION:  Patient declined needing oral supplements today. If patient unable to eat solid foods can drink 5 ensure enlive/day to better meet nutritional needs. Encouraged blending foods including good sources of protein.    Consider feeding tube placement with already hospital admission for painful swallowing, reduced po intake, treatment to continue and side effects to linger after treatment is over.   Patient has contact information    MONITORING, EVALUATION, GOAL: Patient will consume adequate calories and protein to maintain weight during treatment   NEXT VISIT: April 15th, phone f/u    Connie Cameron B. Zenia Resides, [place], [place] Registered Dietitian [phone] (pager)

## 2019-05-07 ENCOUNTER — Ambulatory Visit: Payer: Medicaid Other

## 2019-05-07 LAB — CULTURE, BLOOD (ROUTINE X 2)
Culture: NO GROWTH
Culture: NO GROWTH
Special Requests: ADEQUATE

## 2019-05-08 ENCOUNTER — Ambulatory Visit: Payer: Medicaid Other

## 2019-05-10 ENCOUNTER — Ambulatory Visit
Admission: RE | Admit: 2019-05-10 | Discharge: 2019-05-10 | Disposition: A | Discharge status: Home / Self Care | Payer: Medicaid Other | Source: Ambulatory Visit | Attending: Radiation Oncology | Admitting: Radiation Oncology

## 2019-05-10 DIAGNOSIS — Z510 Encounter for antineoplastic radiation therapy: Secondary | ICD-10-CM | POA: Insufficient documentation

## 2019-05-10 DIAGNOSIS — C01 Malignant neoplasm of base of tongue: Secondary | ICD-10-CM | POA: Insufficient documentation

## 2019-05-10 NOTE — Progress Notes (Signed)
Called patient unable to leave message mailbox is full.

## 2019-05-11 ENCOUNTER — Encounter: Payer: Self-pay | Admitting: Oncology

## 2019-05-11 ENCOUNTER — Inpatient Hospital Stay (HOSPITAL_BASED_OUTPATIENT_CLINIC_OR_DEPARTMENT_OTHER): Payer: Medicaid Other | Admitting: Oncology

## 2019-05-11 ENCOUNTER — Other Ambulatory Visit: Payer: Self-pay

## 2019-05-11 ENCOUNTER — Inpatient Hospital Stay: Payer: Medicaid Other

## 2019-05-11 ENCOUNTER — Ambulatory Visit
Admission: RE | Admit: 2019-05-11 | Discharge: 2019-05-11 | Disposition: A | Discharge status: Home / Self Care | Payer: Medicaid Other | Source: Ambulatory Visit | Attending: Radiation Oncology | Admitting: Radiation Oncology

## 2019-05-11 VITALS — BP 150/91 | HR 79 | Temp 97.1°F | Ht 63.0 in | Wt 169.0 lb

## 2019-05-11 VITALS — BP 155/89 | HR 64 | Resp 18

## 2019-05-11 DIAGNOSIS — K208 Other esophagitis without bleeding: Secondary | ICD-10-CM

## 2019-05-11 DIAGNOSIS — K1233 Oral mucositis (ulcerative) due to radiation: Secondary | ICD-10-CM | POA: Diagnosis not present

## 2019-05-11 DIAGNOSIS — E876 Hypokalemia: Secondary | ICD-10-CM

## 2019-05-11 DIAGNOSIS — N390 Urinary tract infection, site not specified: Secondary | ICD-10-CM | POA: Diagnosis not present

## 2019-05-11 DIAGNOSIS — Y842 Radiological procedure and radiotherapy as the cause of abnormal reaction of the patient, or of later complication, without mention of misadventure at the time of the procedure: Secondary | ICD-10-CM | POA: Diagnosis not present

## 2019-05-11 DIAGNOSIS — H905 Unspecified sensorineural hearing loss: Secondary | ICD-10-CM

## 2019-05-11 DIAGNOSIS — D72819 Decreased white blood cell count, unspecified: Secondary | ICD-10-CM | POA: Diagnosis not present

## 2019-05-11 DIAGNOSIS — Z95828 Presence of other vascular implants and grafts: Secondary | ICD-10-CM

## 2019-05-11 DIAGNOSIS — H9042 Sensorineural hearing loss, unilateral, left ear, with unrestricted hearing on the contralateral side: Secondary | ICD-10-CM | POA: Diagnosis not present

## 2019-05-11 DIAGNOSIS — C01 Malignant neoplasm of base of tongue: Secondary | ICD-10-CM

## 2019-05-11 DIAGNOSIS — Z5111 Encounter for antineoplastic chemotherapy: Secondary | ICD-10-CM

## 2019-05-11 DIAGNOSIS — T66XXXA Radiation sickness, unspecified, initial encounter: Secondary | ICD-10-CM

## 2019-05-11 DIAGNOSIS — Z79899 Other long term (current) drug therapy: Secondary | ICD-10-CM | POA: Diagnosis not present

## 2019-05-11 DIAGNOSIS — T451X5A Adverse effect of antineoplastic and immunosuppressive drugs, initial encounter: Secondary | ICD-10-CM | POA: Diagnosis not present

## 2019-05-11 DIAGNOSIS — D701 Agranulocytosis secondary to cancer chemotherapy: Secondary | ICD-10-CM | POA: Insufficient documentation

## 2019-05-11 DIAGNOSIS — Z510 Encounter for antineoplastic radiation therapy: Secondary | ICD-10-CM | POA: Diagnosis not present

## 2019-05-11 DIAGNOSIS — Z7952 Long term (current) use of systemic steroids: Secondary | ICD-10-CM | POA: Diagnosis not present

## 2019-05-11 DIAGNOSIS — R531 Weakness: Secondary | ICD-10-CM | POA: Diagnosis not present

## 2019-05-11 DIAGNOSIS — R5383 Other fatigue: Secondary | ICD-10-CM | POA: Diagnosis not present

## 2019-05-11 LAB — BASIC METABOLIC PANEL
Anion gap: 7 (ref 5–15)
BUN: 19 mg/dL (ref 6–20)
CO2: 27 mmol/L (ref 22–32)
Calcium: 8.9 mg/dL (ref 8.9–10.3)
Chloride: 107 mmol/L (ref 98–111)
Creatinine, Ser: 0.69 mg/dL (ref 0.44–1.00)
GFR calc Af Amer: >60 mL/min (ref >60)
GFR calc non Af Amer: >60 mL/min (ref >60)
Glucose, Bld: 98 mg/dL (ref 70–99)
Potassium: 3.1 mmol/L — ABNORMAL LOW (ref 3.5–5.1)
Sodium: 141 mmol/L (ref 135–145)

## 2019-05-11 LAB — CBC WITH DIFFERENTIAL/PLATELET
Abs Immature Granulocytes: 0.03 10*3/uL (ref 0.00–0.07)
Basophils Absolute: 0 10*3/uL (ref 0.0–0.1)
Basophils Relative: 0 %
Eosinophils Absolute: 0 10*3/uL (ref 0.0–0.5)
Eosinophils Relative: 1 %
HCT: 27.7 % — ABNORMAL LOW (ref 36.0–46.0)
Hemoglobin: 9.2 g/dL — ABNORMAL LOW (ref 12.0–15.0)
Immature Granulocytes: 1 %
Lymphocytes Relative: 15 %
Lymphs Abs: 0.4 10*3/uL — ABNORMAL LOW (ref 0.7–4.0)
MCH: 30.3 pg (ref 26.0–34.0)
MCHC: 33.2 g/dL (ref 30.0–36.0)
MCV: 91.1 fL (ref 80.0–100.0)
Monocytes Absolute: 0.4 10*3/uL (ref 0.1–1.0)
Monocytes Relative: 15 %
Neutro Abs: 1.9 10*3/uL (ref 1.7–7.7)
Neutrophils Relative %: 68 %
Platelets: 168 10*3/uL (ref 150–400)
RBC: 3.04 MIL/uL — ABNORMAL LOW (ref 3.87–5.11)
RDW: 14.8 % (ref 11.5–15.5)
WBC: 2.8 10*3/uL — ABNORMAL LOW (ref 4.0–10.5)
nRBC: 0 % (ref 0.0–0.2)

## 2019-05-11 LAB — MAGNESIUM: Magnesium: 2.1 mg/dL (ref 1.7–2.4)

## 2019-05-11 MED ORDER — SODIUM CHLORIDE 0.9 % IV SOLN
150.0000 mg | Freq: Once | INTRAVENOUS | Status: AC
  Administered 2019-05-11: 150 mg via INTRAVENOUS
  Filled 2019-05-11: qty 150

## 2019-05-11 MED ORDER — MORPHINE SULFATE (CONCENTRATE) 10 MG /0.5 ML PO SOLN: 10.0000 mg | ORAL | 0 refills | Status: DC | PRN

## 2019-05-11 MED ORDER — POTASSIUM CHLORIDE 2 MEQ/ML IV SOLN
Freq: Once | INTRAVENOUS | Status: AC
  Filled 2019-05-11: qty 20

## 2019-05-11 MED ORDER — HEPARIN SOD (PORK) LOCK FLUSH 100 UNIT/ML IV SOLN
INTRAVENOUS | Status: AC
  Filled 2019-05-11: qty 5

## 2019-05-11 MED ORDER — PALONOSETRON HCL INJECTION 0.25 MG/5ML
0.2500 mg | Freq: Once | INTRAVENOUS | Status: AC
  Administered 2019-05-11: 0.25 mg via INTRAVENOUS
  Filled 2019-05-11: qty 5

## 2019-05-11 MED ORDER — SODIUM CHLORIDE 0.9 % IV SOLN
10.0000 mg | Freq: Once | INTRAVENOUS | Status: AC
  Administered 2019-05-11: 10 mg via INTRAVENOUS
  Filled 2019-05-11: qty 10

## 2019-05-11 MED ORDER — HEPARIN SOD (PORK) LOCK FLUSH 100 UNIT/ML IV SOLN
500.0000 [IU] | Freq: Once | INTRAVENOUS | Status: AC | PRN
  Administered 2019-05-11: 500 [IU]
  Filled 2019-05-11: qty 5

## 2019-05-11 MED ORDER — SODIUM CHLORIDE 0.9 % IV SOLN
30.0000 mg/m2 | Freq: Once | INTRAVENOUS | Status: AC
  Administered 2019-05-11: 58 mg via INTRAVENOUS
  Filled 2019-05-11: qty 58

## 2019-05-11 MED ORDER — SODIUM CHLORIDE 0.9% FLUSH
10.0000 mL | Freq: Once | INTRAVENOUS | Status: AC
  Administered 2019-05-11: 10 mL via INTRAVENOUS
  Filled 2019-05-11: qty 10

## 2019-05-11 MED ORDER — SODIUM CHLORIDE 0.9 % IV SOLN
Freq: Once | INTRAVENOUS | Status: AC
  Filled 2019-05-11: qty 250

## 2019-05-11 MED ORDER — LORAZEPAM 0.5 MG PO TABS: 0.5000 mg | ORAL_TABLET | Freq: Four times a day (QID) | ORAL | 0 refills | Status: DC | PRN

## 2019-05-11 NOTE — Progress Notes (Signed)
Patient stated that she has had some nausea but her medications take care of it. Patient would like a refill on her lorazepam 0.5 MG.

## 2019-05-12 ENCOUNTER — Inpatient Hospital Stay: Payer: Medicaid Other

## 2019-05-12 ENCOUNTER — Ambulatory Visit
Admission: RE | Admit: 2019-05-12 | Discharge: 2019-05-12 | Disposition: A | Discharge status: Home / Self Care | Payer: Medicaid Other | Source: Ambulatory Visit | Attending: Radiation Oncology | Admitting: Radiation Oncology

## 2019-05-12 ENCOUNTER — Ambulatory Visit: Payer: Self-pay

## 2019-05-12 DIAGNOSIS — Z5111 Encounter for antineoplastic chemotherapy: Secondary | ICD-10-CM | POA: Diagnosis not present

## 2019-05-12 DIAGNOSIS — D701 Agranulocytosis secondary to cancer chemotherapy: Secondary | ICD-10-CM

## 2019-05-12 DIAGNOSIS — Z510 Encounter for antineoplastic radiation therapy: Secondary | ICD-10-CM | POA: Diagnosis not present

## 2019-05-12 DIAGNOSIS — T451X5A Adverse effect of antineoplastic and immunosuppressive drugs, initial encounter: Secondary | ICD-10-CM

## 2019-05-12 MED ORDER — FILGRASTIM-SNDZ 480 MCG/0.8ML IJ SOSY
480.0000 ug | PREFILLED_SYRINGE | Freq: Every day | INTRAMUSCULAR | Status: DC
  Administered 2019-05-12: 480 ug via SUBCUTANEOUS
  Filled 2019-05-12: qty 0.8

## 2019-05-13 ENCOUNTER — Inpatient Hospital Stay: Payer: Medicaid Other

## 2019-05-13 ENCOUNTER — Ambulatory Visit
Admission: RE | Admit: 2019-05-13 | Discharge: 2019-05-13 | Disposition: A | Discharge status: Home / Self Care | Payer: Medicaid Other | Source: Ambulatory Visit | Attending: Radiation Oncology | Admitting: Radiation Oncology

## 2019-05-13 ENCOUNTER — Ambulatory Visit: Payer: Self-pay

## 2019-05-13 ENCOUNTER — Other Ambulatory Visit: Payer: Self-pay

## 2019-05-13 DIAGNOSIS — D701 Agranulocytosis secondary to cancer chemotherapy: Secondary | ICD-10-CM

## 2019-05-13 DIAGNOSIS — Z5111 Encounter for antineoplastic chemotherapy: Secondary | ICD-10-CM | POA: Diagnosis not present

## 2019-05-13 DIAGNOSIS — Z510 Encounter for antineoplastic radiation therapy: Secondary | ICD-10-CM | POA: Diagnosis not present

## 2019-05-13 MED ORDER — FILGRASTIM-SNDZ 480 MCG/0.8ML IJ SOSY
480.0000 ug | PREFILLED_SYRINGE | Freq: Every day | INTRAMUSCULAR | Status: DC
  Administered 2019-05-13: 16:00:00 480 ug via SUBCUTANEOUS
  Filled 2019-05-13: qty 0.8

## 2019-05-13 NOTE — Progress Notes (Signed)
[facility]  [address], [address] [place], [place] [address] Phone: [phone]  Fax: [phone]   Clinic Day:  05/17/2019  Referring physician: No ref. provider found  Chief Complaint: Connie Cameron is a 59 y.o. 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  HPI: The patient was last seen in the medical oncology clinic on 04/28/2019. At that time, she had a sore throat secondary to radiation. Discomfort was managed with Magic mouthwash. She had tinnitus since cycle #2 cisplatin. She denied any nausea. Exam was unremarkable. Patient continued radiation (plan to complete by 05/19/2019). We discussed plan for cycle #3 cisplatin on 05/11/2019. Patient was set to have a audiogram on 05/07/2019.   She had a follow up with Gladstone Pih, RD on 04/29/2019. Her appetite was decreased. Magic mouthwash showed mild improvement. She was drinking nutrition supplements 2 per day. She was encouraged to drink supplements TID.  She was instructed to eat soft moist high calorie, high protein foods.  Audiogram on 04/30/2019 revealed significant decrease in high frequency hearing. 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   Patient was admitted to [facility] from 05/02/2019 to 05/05/2019. She had a fever of 100.5 with associated sore throat. Patient was unable to eat or drink fluids secondary to sore throat. She denied any chills. She received IVF and antibiotics.  Radiation was held. She was started on Decadron 4 mg BID.  Pain was controlled.    She had a follow up with Jennet Maduro, RD on 05/06/2019 via telephone. She was feeling better since admission. She was drinking Ensure and water. She was taking morphine every 6 to 8 hours. If unable to eat solid foods patient was instructed to drink 5 Ensure Enlive/day. Follow up scheduled for 05/20/2019.   Patient was seen in the medical  oncology clinic by Dr. Janese Banks on 05/11/2019. Patient felt better after having a break from treatment. She denied any difficulty swallowing. She was using less pain medication. She felt fatigued. Hematocrit was 27.7, hemoglobin 9.2, platelets 168,000, WBC 2,800 (ANC 1,900). Potassium was 3.1 and magnesium 2.1. She received cisplatin 30 mg/m2 on 05/11/2019. Patient received Zarxio (filgrastim-sndz) 480 mcg on 05/12/2019 and 05/13/2019.   During the interim, she has felt "good". Patient had her radiation prior to her visit today. She will complete radiation on 05/25/2019. She reports having a total of 5 days off from radiation prior to restarting on 05/11/2019.  Her weight is down 6 pounds. She is drinking Ensure. She has no taste. She is eating a soft food diet. She denies any diarrhea. She has mild nausea after treatment which is well controlled.   Tinnitus is slightly better. She denies having any pain or aching following  Zarxio (filgrastim-sndz); she did take he Claritin. She completed Decadron approximately 1 week ago.  She consents to continuation of cisplatin tomorrow.   Past Medical History:  Diagnosis Date  . 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 EXCISION MASS NECK Left 01/18/2019   Procedure: EXCISION MASS NECK/NODE;  Surgeon: Margaretha Sheffield, MD;  Location: [facility] ORS;  Service: ENT;  Laterality: Left;  . LARYNGOSCOPY Bilateral 02/22/2019   Procedure: MICROSCOPIC DIRECT LARYNGOSCOPY AND BIOPSY;  Surgeon: Margaretha Sheffield, MD;  Location: [facility] ORS;  Service: ENT;  Laterality: Bilateral;  . PORTA CATH INSERTION N/A 03/24/2019   Procedure: PORTA CATH INSERTION;  Surgeon: Katha Cabal, MD;  Location: [facility];  Service: Cardiovascular;  Laterality: N/A;  . TONSILLECTOMY    . TUBAL LIGATION      Family History  Problem Relation Age of Onset  . Breast cancer Sister   . Brain cancer Paternal Grandfather     Social History:  reports that  she has never smoked. She has never used smokeless tobacco. She reports previous alcohol use. She reports that she does not use drugs. She use to drink years ago. She denies any exposure to radiation or toxins. 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 She lives in [place]. The patient is alone today.  Allergies: No Known Allergies  Current Medications: Current Outpatient Medications  Medication Sig Dispense Refill  . famotidine (PEPCID) 20 MG tablet Take 1 tablet (20 mg total) by mouth daily. 30 tablet 0  . lidocaine-prilocaine (EMLA) cream Apply to affected area once 30 g 3  . LORazepam (ATIVAN) 0.5 MG tablet Take 1 tablet (0.5 mg total) by mouth every 6 (six) hours as needed (Nausea or vomiting). 30 tablet 0  . magic mouthwash w/lidocaine SOLN Take 5 mLs by mouth 4 (four) times daily as needed for mouth pain. 240 mL 1  . Morphine Sulfate (MORPHINE CONCENTRATE) 10 mg / 0.5 ml concentrated solution Take 0.5 mLs (10 mg total) by mouth every 4 (four) hours as needed for severe pain. 30 mL 0  . ondansetron (ZOFRAN) 8 MG tablet Take 1 tablet (8 mg total) by mouth 2 (two) times daily as needed. Start on the third day after chemotherapy. 30 tablet 1  . sucralfate (CARAFATE) 1 g tablet Take 1 tablet (1 g total) by mouth 3 (three) times daily before meals. Dissolve tablet in warm water, swish and swallow. 90 tablet 6  . dexamethasone (DECADRON) 4 MG tablet Take 1 tablet (4 mg total) by mouth 2 (two) times daily. Take this dose until you follow up with your oncologist (Patient not taking: Reported on 05/17/2019) 14 tablet 0   No current facility-administered medications for this visit.    Review of Systems  Constitutional: Positive for weight loss (6 lbs). Negative for chills, diaphoresis, fever and malaise/fatigue.       Feels "good".  HENT: Positive for hearing loss (high frequency) and tinnitus (improved). Negative for congestion, ear discharge, ear pain, nosebleeds,  sinus pain and sore throat.        No taste.  Eyes: Negative for blurred vision (stable), double vision and photophobia.  Respiratory: Negative.  Negative for cough, hemoptysis, sputum production and shortness of breath.   Cardiovascular: Negative.  Negative for chest pain, palpitations and leg swelling.  Gastrointestinal: Positive for nausea (mild, usually after treatment). Negative for abdominal pain, blood in stool, constipation, diarrhea, heartburn, melena and vomiting.       Drinking Ensure.  Genitourinary: Negative.  Negative for dysuria, frequency, hematuria and urgency.  Musculoskeletal: Negative.  Negative for back pain, joint pain, myalgias and neck pain.  Skin: Negative.  Negative for itching and rash.  Neurological: Negative.  Negative for dizziness, tingling, tremors, sensory change, speech change, focal weakness, weakness and headaches.  Endo/Heme/Allergies: Negative.  Does not bruise/bleed easily.  Psychiatric/Behavioral: Negative for depression and memory loss. The patient is not nervous/anxious and does not have insomnia.   All other systems reviewed and are negative.  Performance status (ECOG): 1  Vitals Blood pressure 136/87, pulse 64, temperature (!) 97.4 F (36.3 C), temperature  source Tympanic, resp. rate 16, weight 163 lb 9.3 oz (74.2 kg), SpO2 100 %.   Physical Exam  Constitutional: She is oriented to person, place, and time. She appears well-developed and well-nourished. No distress.  HENT:  Head: Normocephalic.  Mouth/Throat: No oropharyngeal exudate.  Long brown/blonde hair.  No mucositis.  Mask.  Eyes: Pupils are equal, round, and reactive to light. Conjunctivae and EOM are normal. No scleral icterus.  Blue eyes.  Neck: No JVD present.  Cardiovascular: Normal rate, regular rhythm and normal heart sounds. Exam reveals no gallop.  No murmur heard. Pulmonary/Chest: Effort normal and breath sounds normal. No respiratory distress. She has no wheezes. She has no  rales. She exhibits no tenderness.  Abdominal: Soft. Bowel sounds are normal. She exhibits no distension and no mass. There is no abdominal tenderness. There is no rebound and no guarding.  Musculoskeletal:        General: No tenderness or edema. Normal range of motion.     Cervical back: Normal range of motion and neck supple.  Lymphadenopathy:       Head (right side): No preauricular, no posterior auricular and no occipital adenopathy present.       Head (left side): No preauricular, no posterior auricular and no occipital adenopathy present.    She has no cervical adenopathy.    She has no axillary adenopathy.       Right: No inguinal and no supraclavicular adenopathy present.       Left: No inguinal and no supraclavicular adenopathy present.  Neurological: She is alert and oriented to person, place, and time.  Skin: Skin is warm and dry. No rash noted. She is not diaphoretic. No erythema. No pallor.  Psychiatric: She has a normal mood and affect. Her behavior is normal. Judgment and thought content normal.  Nursing note and vitals reviewed.   Appointment on 05/17/2019  Component Date Value Ref Range Status  . Magnesium 05/17/2019 2.0  1.7 - 2.4 mg/dL Final   Performed at [facility], [address], [place], [place] [address]  . Sodium 05/17/2019 140  135 - 145 mmol/L Final  . Potassium 05/17/2019 4.3  3.5 - 5.1 mmol/L Final  . Chloride 05/17/2019 103  98 - 111 mmol/L Final  . CO2 05/17/2019 29  22 - 32 mmol/L Final  . Glucose, Bld 05/17/2019 102* 70 - 99 mg/dL Final   Glucose reference range applies only to samples taken after fasting for at least 8 hours.  . BUN 05/17/2019 19  6 - 20 mg/dL Final  . Creatinine, Ser 05/17/2019 0.76  0.44 - 1.00 mg/dL Final  . Calcium 05/17/2019 9.1  8.9 - 10.3 mg/dL Final  . Total Protein 05/17/2019 6.3* 6.5 - 8.1 g/dL Final  . Albumin 05/17/2019 4.0  3.5 - 5.0 g/dL Final  . AST 05/17/2019 18  15 - 41 U/L Final  . ALT 05/17/2019 23   0 - 44 U/L Final  . Alkaline Phosphatase 05/17/2019 69  38 - 126 U/L Final  . Total Bilirubin 05/17/2019 0.4  0.3 - 1.2 mg/dL Final  . GFR calc non Af Amer 05/17/2019 >60  >60 mL/min Final  . GFR calc Af Amer 05/17/2019 >60  >60 mL/min Final  . Anion gap 05/17/2019 8  5 - 15 Final   Performed at [facility], [address], [place], [place] [address]  . WBC 05/17/2019 4.2  4.0 - 10.5 K/uL Final  . RBC 05/17/2019 3.58* 3.87 - 5.11  MIL/uL Final  . Hemoglobin 05/17/2019 10.8* 12.0 - 15.0 g/dL Final  . HCT 05/17/2019 33.5* 36.0 - 46.0 % Final  . MCV 05/17/2019 93.6  80.0 - 100.0 fL Final  . MCH 05/17/2019 30.2  26.0 - 34.0 pg Final  . MCHC 05/17/2019 32.2  30.0 - 36.0 g/dL Final  . RDW 05/17/2019 15.9* 11.5 - 15.5 % Final  . Platelets 05/17/2019 164  150 - 400 K/uL Final  . nRBC 05/17/2019 0.0  0.0 - 0.2 % Final  . Neutrophils Relative % 05/17/2019 63  % Final  . Neutro Abs 05/17/2019 2.7  1.7 - 7.7 K/uL Final  . Lymphocytes Relative 05/17/2019 12  % Final  . Lymphs Abs 05/17/2019 0.5* 0.7 - 4.0 K/uL Final  . Monocytes Relative 05/17/2019 19  % Final  . Monocytes Absolute 05/17/2019 0.8  0.1 - 1.0 K/uL Final  . Eosinophils Relative 05/17/2019 1  % Final  . Eosinophils Absolute 05/17/2019 0.0  0.0 - 0.5 K/uL Final  . Basophils Relative 05/17/2019 0  % Final  . Basophils Absolute 05/17/2019 0.0  0.0 - 0.1 K/uL Final  . Immature Granulocytes 05/17/2019 5  % Final  . Abs Immature Granulocytes 05/17/2019 0.22* 0.00 - 0.07 K/uL Final   Performed at [facility], [address] [place], [place] [address]    Assessment:  Connie Cameron is a 59 y.o. female with clinical stage T1N1M0 left base of tongue poorly differentiated squamous cell carcinoma s/p biopsy on 02/22/2019. Pathologyrevealed poorly differentiated squamous cell carcinoma. Malignant cells are positive for p16, p40, and pancytokeratin.   She presented with left cervical adenopathy. 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 Node was 4.0 x 2.3 x 1.8 cm.  Neck CTon 12/21/2018 revealed a 2.4 x 2.1 x 3.4 cm lymph node in the left upper neck. The lymph node showed heterogeneous enhancement and stranding in the surrounding fat. There was a 6 mm left upper posterior lymph node and a 7 mm right level 3 lymph node. Imaging was concerning for malignancy. There were no other enlarged lymph nodes. There was no pharyngeal mass.   PET scan on 02/04/2019 revealed prior left level II cervical node had been excised. There were no findings on PET to suggest a site of primary head/neck cancer. There was symmetric hypermetabolism along the base of the tongue bilaterally favored to be physiologic. There was no evidence of metastatic disease.   Audiogram on 03/11/2019 revealed normal hearing in the right ear through 8 kHz with mild hearing loss at 12 kHz only.  Hearing was within normal limits in the left ear through 3 kHz with mild-severe hearing loss from 4-12 kHz.  Audiogram on 04/30/2019 revealed significant decrease in high-frequency hearing.  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  Radiation began 03/30/2019.  She is s/p 2 cycles of cisplatin (03/30/2019 - 04/20/2019).  She received cisplatin 30 mg/m2 on 05/11/2019 with filgrastim-sndz support on 05/12/2019 and 05/13/2019.   She was admitted to [facility] from 04/02/2019 - 04/06/2019 with cellulitis involving the trapezius and right supraclavicular area.  Port-a-cath was uninvolved.  She received cefepime and vancomycin.  She was discharged on doxycycline.  She was admitted to [facility] from 05/02/2019 to 05/05/2019 with fever of 100.5 and radiation mucositis. Patient was unable to eat or drink fluids secondary to sore throat.  Radiation was held. She was started on Decadron  4 mg BID.    Symptomatically, she is doing well.   She is swallowing without difficulty.  Tinnitus has not progressed.  Exam is stable.  WBC is 4200 ([place] 2700).  Plan: 1.   Labs today: CBC with diff, CMP, Mg. 2.   Stage I left base of tongue carcinoma  Clinically, she is doing well.             She is s/p 2 cycles of cisplatin 100 mg/2.  She is s/p 1 week of cisplatin 30 mg/m2.  Discuss plan for 1-2 more weeks of cisplatin 30 mg/m2.   Patient in agreement.  Radiation mucositis has dramatically improved.  She has minimal nausea.  Tinnitus has improved.  Labs reviewed.  Cisplatin 30 mg/m2 tomorrow in [place].   Orders signed.  No GCSF support needed.             Radiation due to complete on 05/25/2019.  Discuss symptom management.  She has antiemetics and pain medications at home to use on a prn bases.  Interventions are adequate.    3.   High frequency hearing loss and tinnitus  Review change in hearing after cycle #2 cisplatin 100 mg/m2.  Patient notes no progression of symptoms after 1 week of cisplatin 30 mg/m2.  Discuss hearing loss and tinnitus may be permanent.  Patient consents to treatment tomorrow. 4.   RTC tomorrow in [place] for cisplatin. 5.   RTC in 1 week in [place] for labs (CBC with diff, CMP, Mg). 6.   RTC in 1 week for MD assessment in [place]. 7.   RTC on 05/25/2019 in [place] for cisplatin.  I discussed the assessment and treatment plan with the patient.  The patient was provided an opportunity to ask questions and all were answered.  The patient agreed with the plan and demonstrated an understanding of the instructions.  The patient was advised to call back if the symptoms worsen or if the condition fails to improve as anticipated.   Lequita Asal, MD, PhD    05/17/2019, 9:59 AM  I, Selena Batten, am acting as scribe for [employer]. Mike Gip, MD, PhD.  I, Shaunie Boehm C. Mike Gip, MD, have reviewed the above documentation for accuracy and completeness, and I agree with the above.

## 2019-05-13 NOTE — Progress Notes (Signed)
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 Mike Gip. 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 Size of the neck node was greater than 3 cm and therefore concurrent chemoradiation was recommended. Plan is to give her 3 doses of cisplatin 100 mg per metered squared IV every 3 weeks  After cycle 1 of treatment patient was hospitalized for right-sided neck and upper chest wall cellulitis unrelated to port infection and received IV antibiotics  Patient noted to have tinnitus after 2 cycles of cisplatin.  Repeat audiogram showed significant sensorineural hearing loss.  Patient also admitted after 2 cycles of chemotherapy for worsening pain and mucositis  Interval history-patient has not received radiation for a week now and feels much better after receiving a break from treatment.  She denies any difficulty swallowing.  She is not using much of her pain meds but is running out of morphine soon.  She was also prescribed steroids when she was hospitalized for her mucositis.  ECOG PS- 1 Pain scale- 0 Opioid associated constipation- no  Review of systems- Review of Systems  Constitutional: Positive for malaise/fatigue. Negative for chills, fever and weight loss.  HENT: Negative for congestion, ear discharge and nosebleeds.   Eyes: Negative for blurred vision.  Respiratory: Negative for cough, hemoptysis, sputum  production, shortness of breath and wheezing.   Cardiovascular: Negative for chest pain, palpitations, orthopnea and claudication.  Gastrointestinal: Negative for abdominal pain, blood in stool, constipation, diarrhea, heartburn, melena, nausea and vomiting.  Genitourinary: Negative for dysuria, flank pain, frequency, hematuria and urgency.  Musculoskeletal: Negative for back pain, joint pain and myalgias.  Skin: Negative for rash.  Neurological: Negative for dizziness, tingling, focal weakness, seizures, weakness and headaches.  Endo/Heme/Allergies: Does not bruise/bleed easily.  Psychiatric/Behavioral: Negative for depression and suicidal ideas. The patient does not have insomnia.       No Known Allergies   Past Medical History:  Diagnosis Date  . 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 EXCISION MASS NECK Left 01/18/2019   Procedure: EXCISION MASS NECK/NODE;  Surgeon: Margaretha Sheffield, MD;  Location: [facility] ORS;  Service: ENT;  Laterality: Left;  . LARYNGOSCOPY Bilateral 02/22/2019   Procedure: MICROSCOPIC DIRECT LARYNGOSCOPY AND BIOPSY;  Surgeon: Margaretha Sheffield, MD;  Location: [facility] ORS;  Service: ENT;  Laterality: Bilateral;  . PORTA CATH INSERTION N/A 03/24/2019   Procedure: PORTA CATH INSERTION;  Surgeon: Katha Cabal, MD;  Location: [facility];  Service: Cardiovascular;  Laterality: N/A;  . TONSILLECTOMY    . TUBAL LIGATION      Social History   Socioeconomic History  . Marital status: Single    Spouse name: Not on file  . Number of children: Not on file  . Years of education: Not on file  . Highest education level: Not on file  Occupational History  . Not on file  Tobacco Use  . Smoking status: Never Smoker  .  Smokeless tobacco: Never Used  Substance and Sexual Activity  . Alcohol use: Not Currently  . Drug use: No  . Sexual activity: Not on file  Other Topics Concern  . Not on file  Social History Narrative    . Not on file   Social Determinants of Health   Financial Resource Strain:   . Difficulty of Paying Living Expenses:   Food Insecurity:   . Worried About Charity fundraiser in the Last Year:   . Arboriculturist in the Last Year:   Transportation Needs:   . Film/video editor (Medical):   Marland Kitchen Lack of Transportation (Non-Medical):   Physical Activity:   . Days of Exercise per Week:   . Minutes of Exercise per Session:   Stress:   . Feeling of Stress :   Social Connections:   . Frequency of Communication with Friends and Family:   . Frequency of Social Gatherings with Friends and Family:   . Attends Religious Services:   . Active Member of Clubs or Organizations:   . Attends Archivist Meetings:   Marland Kitchen Marital Status:   Intimate Partner Violence:   . Fear of Current or Ex-Partner:   . Emotionally Abused:   Marland Kitchen Physically Abused:   . Sexually Abused:     Family History  Problem Relation Age of Onset  . Breast cancer Sister   . Brain cancer Paternal Grandfather      Current Outpatient Medications:  .  dexamethasone (DECADRON) 4 MG tablet, Take 1 tablet (4 mg total) by mouth 2 (two) times daily. Take this dose until you follow up with your oncologist, Disp: 14 tablet, Rfl: 0 .  famotidine (PEPCID) 20 MG tablet, Take 1 tablet (20 mg total) by mouth daily., Disp: 30 tablet, Rfl: 0 .  lidocaine-prilocaine (EMLA) cream, Apply to affected area once, Disp: 30 g, Rfl: 3 .  magic mouthwash w/lidocaine SOLN, Take 5 mLs by mouth 4 (four) times daily as needed for mouth pain., Disp: 240 mL, Rfl: 1 .  ondansetron (ZOFRAN) 8 MG tablet, Take 1 tablet (8 mg total) by mouth 2 (two) times daily as needed. Start on the third day after chemotherapy., Disp: 30 tablet, Rfl: 1 .  sucralfate (CARAFATE) 1 g tablet, Take 1 tablet (1 g total) by mouth 3 (three) times daily before meals. Dissolve tablet in warm water, swish and swallow., Disp: 90 tablet, Rfl: 6 .  LORazepam (ATIVAN) 0.5 MG  tablet, Take 1 tablet (0.5 mg total) by mouth every 6 (six) hours as needed (Nausea or vomiting)., Disp: 30 tablet, Rfl: 0 .  Morphine Sulfate (MORPHINE CONCENTRATE) 10 mg / 0.5 ml concentrated solution, Take 0.5 mLs (10 mg total) by mouth every 4 (four) hours as needed for severe pain., Disp: 30 mL, Rfl: 0  Physical exam:  Vitals:   05/11/19 0858 05/11/19 0900  BP:  (!) 150/91  Pulse:  79  Temp: (!) 97.1 F (36.2 C) (!) 97.1 F (36.2 C)  TempSrc: Tympanic Tympanic  Weight: 169 lb (76.7 kg) 169 lb (76.7 kg)  Height: 5\' 3"  (1.6 m) 5\' 3"  (1.6 m)   Physical Exam Constitutional:      General: She is not in acute distress. HENT:     Mouth/Throat:     Mouth: Mucous membranes are moist.     Pharynx: Oropharynx is clear.  Cardiovascular:     Rate and Rhythm: Normal rate and regular rhythm.     Heart sounds: Normal  heart sounds.  Pulmonary:     Effort: Pulmonary effort is normal.     Breath sounds: Normal breath sounds.  Abdominal:     General: Bowel sounds are normal.     Palpations: Abdomen is soft.  Skin:    General: Skin is warm and dry.  Neurological:     Mental Status: She is alert and oriented to person, place, and time.      CMP Latest Ref Rng & Units 05/11/2019  Glucose 70 - 99 mg/dL 98  BUN 6 - 20 mg/dL 19  Creatinine 0.44 - 1.00 mg/dL 0.69  Sodium 135 - 145 mmol/L 141  Potassium 3.5 - 5.1 mmol/L 3.1(L)  Chloride 98 - 111 mmol/L 107  CO2 22 - 32 mmol/L 27  Calcium 8.9 - 10.3 mg/dL 8.9  Total Protein 6.5 - 8.1 g/dL -  Total Bilirubin 0.3 - 1.2 mg/dL -  Alkaline Phos 38 - 126 U/L -  AST 15 - 41 U/L -  ALT 0 - 44 U/L -   CBC Latest Ref Rng & Units 05/11/2019  WBC 4.0 - 10.5 K/uL 2.8(L)  Hemoglobin 12.0 - 15.0 g/dL 9.2(L)  Hematocrit 36.0 - 46.0 % 27.7(L)  Platelets 150 - 400 K/uL 168    No images are attached to the encounter.  DG Chest Port 1 View  Result Date: 05/02/2019 CLINICAL DATA:  Fevers, history of throat carcinoma EXAM: PORTABLE CHEST 1 VIEW  COMPARISON:  12/21/2018 plain film, CT from 04/02/2019 FINDINGS: Cardiac shadow is within normal limits. Aortic calcifications are noted. Right chest wall port is noted with the catheter tip in the proximal superior vena cava. No focal infiltrate or effusion is seen. No bony abnormality is noted. IMPRESSION: No acute abnormality seen. Electronically Signed   By: Inez Catalina M.D.   On: 05/02/2019 20:49     Assessment and plan- Patient is a 59 y.o. 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   He is here for on treatment assessment prior to cycle 3 of cisplatin chemotherapy  Patient noted to have significant sensorineural hearing loss after 2 doses of high-dose cisplatin.  I discussed her case with Dr. Mike Gip who is her primary oncologist and plan is to proceed with weekly cisplatin chemotherapy at this time instead of 100 mg per metered squared dose.  Also patient has leukopenia today with a white cell count of 2.8 and an ANC of 1.9.  I will therefore be giving her cisplatin at 30 mg per metered squared.    She will come back on day 2 and day 3 to receive zarxio  Patient will see Dr. Mike Gip on 05/17/2019 with labs CBC with differential and CMP and possible fluids.  She will return on 05/18/2019 and directly proceed with cisplatin chemotherapy  Patient completes radiation treatment next week.  Her future follow-up appointments will be determined by Dr. 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  No evidence of mucositis on today's exam  Cisplatin induced sensorineural hearing loss: Continue to monitor.  She is able to hear normal conversations in the room without the need for hearing aid.  Hypokalemia: We will give her extra IV potassium in her cisplatin today.  Magnesium levels are normal   Visit Diagnosis 1. Encounter for antineoplastic chemotherapy   2. Chemotherapy induced neutropenia (HCC)   3. Radiation esophagitis     4. Sensorineural hearing loss (SNHL) of left ear, unspecified hearing status on  contralateral side   5. Hypokalemia      Dr. Randa Evens, MD, MPH [facility] at [facility] [id number] 05/13/2019 8:11 AM

## 2019-05-14 ENCOUNTER — Ambulatory Visit
Admission: RE | Admit: 2019-05-14 | Discharge: 2019-05-14 | Disposition: A | Discharge status: Home / Self Care | Payer: Medicaid Other | Source: Ambulatory Visit | Attending: Radiation Oncology | Admitting: Radiation Oncology

## 2019-05-14 DIAGNOSIS — Z510 Encounter for antineoplastic radiation therapy: Secondary | ICD-10-CM | POA: Diagnosis not present

## 2019-05-17 ENCOUNTER — Ambulatory Visit
Admission: RE | Admit: 2019-05-17 | Discharge: 2019-05-17 | Disposition: A | Discharge status: Home / Self Care | Payer: Medicaid Other | Source: Ambulatory Visit | Attending: Radiation Oncology | Admitting: Radiation Oncology

## 2019-05-17 ENCOUNTER — Ambulatory Visit: Payer: Medicaid Other

## 2019-05-17 ENCOUNTER — Other Ambulatory Visit: Payer: Self-pay

## 2019-05-17 ENCOUNTER — Ambulatory Visit: Payer: Self-pay

## 2019-05-17 ENCOUNTER — Other Ambulatory Visit: Payer: Self-pay | Admitting: Hematology and Oncology

## 2019-05-17 ENCOUNTER — Inpatient Hospital Stay (HOSPITAL_BASED_OUTPATIENT_CLINIC_OR_DEPARTMENT_OTHER): Payer: Medicaid Other | Admitting: Hematology and Oncology

## 2019-05-17 ENCOUNTER — Encounter: Payer: Self-pay | Admitting: Hematology and Oncology

## 2019-05-17 ENCOUNTER — Inpatient Hospital Stay: Payer: Medicaid Other

## 2019-05-17 VITALS — BP 136/87 | HR 64 | Temp 97.4°F | Resp 16 | Wt 163.6 lb

## 2019-05-17 DIAGNOSIS — C01 Malignant neoplasm of base of tongue: Secondary | ICD-10-CM

## 2019-05-17 DIAGNOSIS — Z5111 Encounter for antineoplastic chemotherapy: Secondary | ICD-10-CM | POA: Diagnosis not present

## 2019-05-17 DIAGNOSIS — H9313 Tinnitus, bilateral: Secondary | ICD-10-CM

## 2019-05-17 DIAGNOSIS — Z7189 Other specified counseling: Secondary | ICD-10-CM

## 2019-05-17 DIAGNOSIS — Z510 Encounter for antineoplastic radiation therapy: Secondary | ICD-10-CM | POA: Diagnosis not present

## 2019-05-17 LAB — CBC WITH DIFFERENTIAL/PLATELET
Abs Immature Granulocytes: 0.22 10*3/uL — ABNORMAL HIGH (ref 0.00–0.07)
Basophils Absolute: 0 10*3/uL (ref 0.0–0.1)
Basophils Relative: 0 %
Eosinophils Absolute: 0 10*3/uL (ref 0.0–0.5)
Eosinophils Relative: 1 %
HCT: 33.5 % — ABNORMAL LOW (ref 36.0–46.0)
Hemoglobin: 10.8 g/dL — ABNORMAL LOW (ref 12.0–15.0)
Immature Granulocytes: 5 %
Lymphocytes Relative: 12 %
Lymphs Abs: 0.5 10*3/uL — ABNORMAL LOW (ref 0.7–4.0)
MCH: 30.2 pg (ref 26.0–34.0)
MCHC: 32.2 g/dL (ref 30.0–36.0)
MCV: 93.6 fL (ref 80.0–100.0)
Monocytes Absolute: 0.8 10*3/uL (ref 0.1–1.0)
Monocytes Relative: 19 %
Neutro Abs: 2.7 10*3/uL (ref 1.7–7.7)
Neutrophils Relative %: 63 %
Platelets: 164 10*3/uL (ref 150–400)
RBC: 3.58 MIL/uL — ABNORMAL LOW (ref 3.87–5.11)
RDW: 15.9 % — ABNORMAL HIGH (ref 11.5–15.5)
WBC: 4.2 10*3/uL (ref 4.0–10.5)
nRBC: 0 % (ref 0.0–0.2)

## 2019-05-17 LAB — COMPREHENSIVE METABOLIC PANEL
ALT: 23 U/L (ref 0–44)
AST: 18 U/L (ref 15–41)
Albumin: 4 g/dL (ref 3.5–5.0)
Alkaline Phosphatase: 69 U/L (ref 38–126)
Anion gap: 8 (ref 5–15)
BUN: 19 mg/dL (ref 6–20)
CO2: 29 mmol/L (ref 22–32)
Calcium: 9.1 mg/dL (ref 8.9–10.3)
Chloride: 103 mmol/L (ref 98–111)
Creatinine, Ser: 0.76 mg/dL (ref 0.44–1.00)
GFR calc Af Amer: >60 mL/min (ref >60)
GFR calc non Af Amer: >60 mL/min (ref >60)
Glucose, Bld: 102 mg/dL — ABNORMAL HIGH (ref 70–99)
Potassium: 4.3 mmol/L (ref 3.5–5.1)
Sodium: 140 mmol/L (ref 135–145)
Total Bilirubin: 0.4 mg/dL (ref 0.3–1.2)
Total Protein: 6.3 g/dL — ABNORMAL LOW (ref 6.5–8.1)

## 2019-05-17 LAB — MAGNESIUM: Magnesium: 2 mg/dL (ref 1.7–2.4)

## 2019-05-17 NOTE — Progress Notes (Signed)
Patient here for follow up. Denies any concerns.  

## 2019-05-18 ENCOUNTER — Inpatient Hospital Stay: Payer: Medicaid Other

## 2019-05-18 ENCOUNTER — Ambulatory Visit: Payer: Self-pay | Admitting: Oncology

## 2019-05-18 ENCOUNTER — Other Ambulatory Visit: Payer: Self-pay

## 2019-05-18 ENCOUNTER — Ambulatory Visit
Admission: RE | Admit: 2019-05-18 | Discharge: 2019-05-18 | Disposition: A | Discharge status: Home / Self Care | Payer: Medicaid Other | Source: Ambulatory Visit | Attending: Radiation Oncology | Admitting: Radiation Oncology

## 2019-05-18 ENCOUNTER — Ambulatory Visit: Payer: Self-pay

## 2019-05-18 VITALS — BP 142/86 | HR 70 | Temp 97.7°F | Resp 18 | Wt 162.8 lb

## 2019-05-18 DIAGNOSIS — C01 Malignant neoplasm of base of tongue: Secondary | ICD-10-CM

## 2019-05-18 DIAGNOSIS — Z510 Encounter for antineoplastic radiation therapy: Secondary | ICD-10-CM | POA: Diagnosis not present

## 2019-05-18 DIAGNOSIS — Z5111 Encounter for antineoplastic chemotherapy: Secondary | ICD-10-CM | POA: Insufficient documentation

## 2019-05-18 DIAGNOSIS — Z7189 Other specified counseling: Secondary | ICD-10-CM

## 2019-05-18 MED ORDER — SODIUM CHLORIDE 0.9 % IV SOLN
10.0000 mg | Freq: Once | INTRAVENOUS | Status: AC
  Administered 2019-05-18: 10 mg via INTRAVENOUS
  Filled 2019-05-18: qty 10

## 2019-05-18 MED ORDER — SODIUM CHLORIDE 0.9 % IV SOLN
INTRAVENOUS | Status: DC
  Filled 2019-05-18: qty 250

## 2019-05-18 MED ORDER — SODIUM CHLORIDE 0.9 % IV SOLN
150.0000 mg | Freq: Once | INTRAVENOUS | Status: AC
  Administered 2019-05-18: 150 mg via INTRAVENOUS
  Filled 2019-05-18: qty 5

## 2019-05-18 MED ORDER — SODIUM CHLORIDE 0.9 % IV SOLN
30.0000 mg/m2 | Freq: Once | INTRAVENOUS | Status: AC
  Administered 2019-05-18: 58 mg via INTRAVENOUS
  Filled 2019-05-18: qty 58

## 2019-05-18 MED ORDER — HEPARIN SOD (PORK) LOCK FLUSH 100 UNIT/ML IV SOLN
500.0000 [IU] | Freq: Once | INTRAVENOUS | Status: AC | PRN
  Administered 2019-05-18: 500 [IU]
  Filled 2019-05-18: qty 5

## 2019-05-18 MED ORDER — HEPARIN SOD (PORK) LOCK FLUSH 100 UNIT/ML IV SOLN
INTRAVENOUS | Status: AC
  Filled 2019-05-18: qty 5

## 2019-05-18 MED ORDER — PALONOSETRON HCL INJECTION 0.25 MG/5ML
0.2500 mg | Freq: Once | INTRAVENOUS | Status: AC
  Administered 2019-05-18: 0.25 mg via INTRAVENOUS
  Filled 2019-05-18: qty 5

## 2019-05-18 MED ORDER — POTASSIUM CHLORIDE 2 MEQ/ML IV SOLN
Freq: Once | INTRAVENOUS | Status: AC
  Filled 2019-05-18: qty 1000

## 2019-05-18 NOTE — Progress Notes (Signed)
Pharmacist Chemotherapy Monitoring - Follow Up Assessment    I verify that I have reviewed each item in the below checklist:  . Regimen for the patient is scheduled for the appropriate day and plan matches scheduled date. Marland Kitchen Appropriate non-routine labs are ordered dependent on drug ordered. . If applicable, additional medications reviewed and ordered per protocol based on lifetime cumulative doses and/or treatment regimen.   Plan for follow-up and/or issues identified: No . I-vent associated with next due treatment: No . MD and/or nursing notified: No  Connie Cameron 05/18/2019 8:37 AM

## 2019-05-19 ENCOUNTER — Inpatient Hospital Stay: Payer: Medicaid Other

## 2019-05-19 ENCOUNTER — Ambulatory Visit
Admission: RE | Admit: 2019-05-19 | Discharge: 2019-05-19 | Disposition: A | Discharge status: Home / Self Care | Payer: Medicaid Other | Source: Ambulatory Visit | Attending: Radiation Oncology | Admitting: Radiation Oncology

## 2019-05-19 ENCOUNTER — Ambulatory Visit: Payer: Medicaid Other

## 2019-05-19 DIAGNOSIS — Z510 Encounter for antineoplastic radiation therapy: Secondary | ICD-10-CM | POA: Diagnosis not present

## 2019-05-20 ENCOUNTER — Inpatient Hospital Stay: Payer: MEDICAID | Admitting: Hematology and Oncology

## 2019-05-20 ENCOUNTER — Inpatient Hospital Stay: Payer: Medicaid Other

## 2019-05-20 ENCOUNTER — Inpatient Hospital Stay: Payer: MEDICAID

## 2019-05-20 ENCOUNTER — Ambulatory Visit
Admission: RE | Admit: 2019-05-20 | Discharge: 2019-05-20 | Disposition: A | Discharge status: Home / Self Care | Payer: Medicaid Other | Source: Ambulatory Visit | Attending: Radiation Oncology | Admitting: Radiation Oncology

## 2019-05-20 DIAGNOSIS — Z510 Encounter for antineoplastic radiation therapy: Secondary | ICD-10-CM | POA: Diagnosis not present

## 2019-05-20 NOTE — Progress Notes (Signed)
Nutrition Follow-up:  Patient with base of the tongue cancer.  Patient receiving chemo and radiation.  Followed by Dr. Colbert Coyer.  Met with patient following radiation this am.  Patient reports pain on swallowing is much better.  Reports that she is drinking 3 ensure/boost daily.  Tried chicken but to grainy and difficulty to swallow.  Avoiding most meats except for fish.  Eating eggs, dairy products with protein.   Medications: reviewed  Labs: reviewed  Anthropometrics:   Weight 162 lb 12.8 oz on 4/13 decreased from 173 lb on 3/28 (hospital weight) 3/23 173 lb 8 oz per [facility] 180 lb 6 oz on 3/9 per [facility] 184 lb on 02/22/19   12% weight loss in the last 3 months, significant   NUTRITION DIAGNOSIS: Predicted suboptimal energy intake continues   INTERVENTION:  Recommend patient drink up to 5 ensure enlive per day with continued weight loss. Patient denied needing ensure today.  Reviewed foods high in protein as patient currently avoiding meats.  Also discussed daily protein goal and ways to meet that in diet.  Patient has contact information    MONITORING, EVALUATION, GOAL: Patient will consume adequate calories and protein to maintain weight during treatment.   NEXT VISIT: May 6 phone f/u  Connie Cameron, [place], [place] Registered Dietitian [phone] (pager)

## 2019-05-21 ENCOUNTER — Ambulatory Visit
Admission: RE | Admit: 2019-05-21 | Discharge: 2019-05-21 | Disposition: A | Discharge status: Home / Self Care | Payer: Medicaid Other | Source: Ambulatory Visit | Attending: Radiation Oncology | Admitting: Radiation Oncology

## 2019-05-21 ENCOUNTER — Other Ambulatory Visit: Payer: Self-pay

## 2019-05-21 ENCOUNTER — Encounter: Payer: Self-pay | Admitting: Hematology and Oncology

## 2019-05-21 DIAGNOSIS — Z510 Encounter for antineoplastic radiation therapy: Secondary | ICD-10-CM | POA: Diagnosis not present

## 2019-05-21 NOTE — Progress Notes (Signed)
No new changes noted today. The patient Name and DOB has been verified by phone today. 

## 2019-05-22 NOTE — Progress Notes (Signed)
[facility]  [address] [place], [place] [address] Phone: [phone]  Fax: [phone]   Clinic Day:  05/24/2019  Referring physician: No ref. provider found  Chief Complaint: Connie Cameron is a 59 y.o. 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   HPI: The patient was last seen in the medical oncology clinic on 05/17/2019. At that time, she was doing well. She was swallowing without difficulty.  Tinnitus had not progressed.  Exam was stable. Hematocrit was 33.5, hemoglobin 10.78, platelets 164,000, WBC 4,200 (ANC 2,700). Total protein 6.3. Magnesium 2.0.   She received cisplatin 30 mg/m2 in [place] on 05/18/2019.  Patient continued to receive radiation treatment.   She spoke with Jennet Maduro, RD on 05/20/2019. Pain with swallowing was much better. She as drinking 3 Ensure/Boost daily.  She was recommended to drink up with 5 Ensure per day since she continued to lose weight. She was advised to eat food high in protein. Follow up was planned for 06/10/2019.   During the interim, she has felt "good". She reports on Saturday and Sunday she had a big appetite. She has been eating a lot of vegetables and fruit. She was drinking a lot of shakes. She has been drinking 4-5 Ensure a day. She is not on any potassium. She denies any tinnitus. Her hearing is better. Her energy is good. Her vision is about the same. She denies any bruising or bleeding.    Past Medical History:  Diagnosis Date  . Throat cancer ([place]) 2021    Past Surgical History:  Procedure Laterality Date  . EXCISION MASS NECK Left 01/18/2019   Procedure: EXCISION MASS NECK/NODE;  Surgeon: Margaretha Sheffield, MD;  Location: [facility] ORS;  Service: ENT;  Laterality: Left;  . LARYNGOSCOPY Bilateral 02/22/2019   Procedure: MICROSCOPIC DIRECT LARYNGOSCOPY AND BIOPSY;  Surgeon: Margaretha Sheffield, MD;  Location:  [facility] ORS;  Service: ENT;  Laterality: Bilateral;  . PORTA CATH INSERTION N/A 03/24/2019   Procedure: PORTA CATH INSERTION;  Surgeon: Katha Cabal, MD;  Location: [facility];  Service: Cardiovascular;  Laterality: N/A;  . TONSILLECTOMY    . TUBAL LIGATION      Family History  Problem Relation Age of Onset  . Breast cancer Sister   . Brain cancer Paternal Grandfather     Social History:  reports that she has never smoked. She has never used smokeless tobacco. She reports previous alcohol use. She reports that she does not use drugs. She use to drink years ago. She denies any exposure to radiation or toxins. 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 She lives in [place]. The patient is alone today.  Allergies: No Known Allergies  Current Medications: Current Outpatient Medications  Medication Sig Dispense Refill  . dexamethasone (DECADRON) 4 MG tablet Take 1 tablet (4 mg total) by mouth 2 (two) times daily. Take this dose until you follow up with your oncologist 14 tablet 0  . famotidine (PEPCID) 20 MG tablet Take 1 tablet (20 mg total) by mouth daily. 30 tablet 0  . lidocaine-prilocaine (EMLA) cream Apply to affected area once 30 g 3  . LORazepam (ATIVAN) 0.5 MG tablet Take 1 tablet (0.5 mg total) by mouth every 6 (six) hours as needed (Nausea or vomiting). 30 tablet 0  . Morphine Sulfate (MORPHINE CONCENTRATE) 10 mg / 0.5  ml concentrated solution Take 0.5 mLs (10 mg total) by mouth every 4 (four) hours as needed for severe pain. 30 mL 0  . ondansetron (ZOFRAN) 8 MG tablet Take 1 tablet (8 mg total) by mouth 2 (two) times daily as needed. Start on the third day after chemotherapy. 30 tablet 1  . sucralfate (CARAFATE) 1 g tablet Take 1 tablet (1 g total) by mouth 3 (three) times daily before meals. Dissolve tablet in warm water, swish and swallow. 90 tablet 6  . magic mouthwash w/lidocaine SOLN Take 5 mLs by mouth 4 (four) times daily as needed  for mouth pain. (Patient not taking: Reported on 05/21/2019) 240 mL 1   No current facility-administered medications for this visit.    Review of Systems  Constitutional: Positive for weight loss (7 lbs). Negative for chills, diaphoresis, fever and malaise/fatigue.       Feels "good".  HENT: Positive for hearing loss (high frequency; better). Negative for congestion, ear discharge, ear pain, nosebleeds, sinus pain, sore throat and tinnitus (resolved).   Eyes: Negative for blurred vision (stable), double vision and photophobia.  Respiratory: Negative.  Negative for cough, hemoptysis, sputum production and shortness of breath.   Cardiovascular: Negative.  Negative for chest pain, palpitations and leg swelling.  Gastrointestinal: Negative for abdominal pain, blood in stool, constipation, diarrhea, heartburn, melena, nausea (mild, usually after treatment) and vomiting.       Eating better. Drinkning 4-5 Ensure a day.  Genitourinary: Negative.  Negative for dysuria, frequency, hematuria and urgency.  Musculoskeletal: Negative.  Negative for back pain, joint pain, myalgias and neck pain.  Skin: Negative.  Negative for itching and rash.  Neurological: Negative.  Negative for dizziness, tingling, tremors, sensory change, speech change, focal weakness, weakness and headaches.  Endo/Heme/Allergies: Negative.  Does not bruise/bleed easily.  Psychiatric/Behavioral: Negative.  Negative for depression and memory loss. The patient is not nervous/anxious and does not have insomnia.   All other systems reviewed and are negative.  Performance status (ECOG): 1  Vitals Blood pressure 122/76, pulse 69, temperature (!) 97.5 F (36.4 C), temperature source Tympanic, resp. rate 16, weight 156 lb 3.1 oz (70.8 kg), SpO2 100 %.   Physical Exam  Constitutional: She is oriented to person, place, and time. She appears well-developed and well-nourished. No distress.  HENT:  Head: Normocephalic.  Mouth/Throat: No  oropharyngeal exudate.  Long brown/blonde hair.  No mucositis.  Mask.  Eyes: Pupils are equal, round, and reactive to light. Conjunctivae and EOM are normal. No scleral icterus.  Blue eyes.  Neck: No JVD present.  Cardiovascular: Normal rate, regular rhythm and normal heart sounds. Exam reveals no gallop.  No murmur heard. Pulmonary/Chest: Effort normal and breath sounds normal. No respiratory distress. She has no wheezes. She has no rales. She exhibits no tenderness.  Abdominal: Soft. Bowel sounds are normal. She exhibits no distension and no mass. There is no abdominal tenderness. There is no rebound and no guarding.  Musculoskeletal:        General: No tenderness or edema. Normal range of motion.     Cervical back: Normal range of motion and neck supple.  Lymphadenopathy:       Head (right side): No preauricular, no posterior auricular and no occipital adenopathy present.       Head (left side): No preauricular, no posterior auricular and no occipital adenopathy present.    She has no cervical adenopathy.    She has no axillary adenopathy.       Right:  No inguinal and no supraclavicular adenopathy present.       Left: No inguinal and no supraclavicular adenopathy present.  Neurological: She is alert and oriented to person, place, and time.  Skin: Skin is warm and dry. No rash noted. She is not diaphoretic. There is erythema (s/p radiation). No pallor.  Psychiatric: She has a normal mood and affect. Her behavior is normal. Judgment and thought content normal.  Nursing note and vitals reviewed.   Appointment on 05/24/2019  Component Date Value Ref Range Status  . Magnesium 05/24/2019 2.1  1.7 - 2.4 mg/dL Final   Performed at [facility], [address], [place], [place] [address]  . Sodium 05/24/2019 138  135 - 145 mmol/L Final  . Potassium 05/24/2019 5.0  3.5 - 5.1 mmol/L Final  . Chloride 05/24/2019 102  98 - 111 mmol/L Final  . CO2 05/24/2019 27  22 - 32 mmol/L Final  .  Glucose, Bld 05/24/2019 126* 70 - 99 mg/dL Final   Glucose reference range applies only to samples taken after fasting for at least 8 hours.  . BUN 05/24/2019 23* 6 - 20 mg/dL Final  . Creatinine, Ser 05/24/2019 0.84  0.44 - 1.00 mg/dL Final  . Calcium 05/24/2019 9.3  8.9 - 10.3 mg/dL Final  . Total Protein 05/24/2019 6.6  6.5 - 8.1 g/dL Final  . Albumin 05/24/2019 4.1  3.5 - 5.0 g/dL Final  . AST 05/24/2019 24  15 - 41 U/L Final  . ALT 05/24/2019 31  0 - 44 U/L Final  . Alkaline Phosphatase 05/24/2019 65  38 - 126 U/L Final  . Total Bilirubin 05/24/2019 0.7  0.3 - 1.2 mg/dL Final  . GFR calc non Af Amer 05/24/2019 >60  >60 mL/min Final  . GFR calc Af Amer 05/24/2019 >60  >60 mL/min Final  . Anion gap 05/24/2019 9  5 - 15 Final   Performed at [facility], [address], [place], [place] [address]  . WBC 05/24/2019 2.5* 4.0 - 10.5 K/uL Final  . RBC 05/24/2019 3.31* 3.87 - 5.11 MIL/uL Final  . Hemoglobin 05/24/2019 10.2* 12.0 - 15.0 g/dL Final  . HCT 05/24/2019 30.6* 36.0 - 46.0 % Final  . MCV 05/24/2019 92.4  80.0 - 100.0 fL Final  . MCH 05/24/2019 30.8  26.0 - 34.0 pg Final  . MCHC 05/24/2019 33.3  30.0 - 36.0 g/dL Final  . RDW 05/24/2019 15.4  11.5 - 15.5 % Final  . Platelets 05/24/2019 146* 150 - 400 K/uL Final  . nRBC 05/24/2019 0.0  0.0 - 0.2 % Final  . Neutrophils Relative % 05/24/2019 73  % Final  . Neutro Abs 05/24/2019 1.8  1.7 - 7.7 K/uL Final  . Lymphocytes Relative 05/24/2019 11  % Final  . Lymphs Abs 05/24/2019 0.3* 0.7 - 4.0 K/uL Final  . Monocytes Relative 05/24/2019 15  % Final  . Monocytes Absolute 05/24/2019 0.4  0.1 - 1.0 K/uL Final  . Eosinophils Relative 05/24/2019 0  % Final  . Eosinophils Absolute 05/24/2019 0.0  0.0 - 0.5 K/uL Final  . Basophils Relative 05/24/2019 0  % Final  . Basophils Absolute 05/24/2019 0.0  0.0 - 0.1 K/uL Final  . Immature Granulocytes 05/24/2019 1  % Final  . Abs Immature Granulocytes 05/24/2019 0.02  0.00 - 0.07 K/uL Final    Performed at [facility], [address], [place], [place] [address]    Assessment:  LYNNOX PROFIT is a 59 y.o. female  with clinical stage T1N1M0 left base of tongue poorly differentiated squamous cell carcinoma s/p biopsy on 02/22/2019. Pathologyrevealed poorly differentiated squamous cell carcinoma. Malignant cells are positive for p16, p40, and pancytokeratin.   She presented with left cervical adenopathy. 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 Node was 4.0 x 2.3 x 1.8 cm.  Neck CTon 12/21/2018 revealed a 2.4 x 2.1 x 3.4 cm lymph node in the left upper neck. The lymph node showed heterogeneous enhancement and stranding in the surrounding fat. There was a 6 mm left upper posterior lymph node and a 7 mm right level 3 lymph node. Imaging was concerning for malignancy. There were no other enlarged lymph nodes. There was no pharyngeal mass.   PET scan on 02/04/2019 revealed prior left level II cervical node had been excised. There were no findings on PET to suggest a site of primary head/neck cancer. There was symmetric hypermetabolism along the base of the tongue bilaterally favored to be physiologic. There was no evidence of metastatic disease.   Audiogram on 03/11/2019 revealed normal hearing in the right ear through 8 kHz with mild hearing loss at 12 kHz only.  Hearing was within normal limits in the left ear through 3 kHz with mild-severe hearing loss from 4-12 kHz.  Audiogram on 04/30/2019 revealed significant decrease in high-frequency hearing.  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  Radiation began 03/30/2019.  She is s/p 2 cycles of cisplatin 100 mg/m2 (03/30/2019 - 04/20/2019).  She received weekly cisplatin 30 mg/m2 x2 (05/11/2019 and 04/13//2021).  She received filgrastim-sndz support on 05/12/2019 and 05/13/2019.   She was  admitted to [facility] from 04/02/2019 - 04/06/2019 with cellulitis involving the trapezius and right supraclavicular area.  Port-a-cath was uninvolved.  She received cefepime and vancomycin.  She was discharged on doxycycline.  She was admitted to [facility] from 05/02/2019 to 05/05/2019 with fever of 100.5 and radiation mucositis. Patient was unable to eat or drink fluids secondary to sore throat.  Radiation was held. She was started on Decadron 4 mg BID.    Symptomatically, she feels good.  She is eating soft food, fruits and vegetables and drinking Ensure.  WBC is 2500 (ANC 1800).  Plan: 1.   Labs today: CBC with diff, CMP, Mg. 2.   Stage I left base of tongue carcinoma  Clinically, she continues to do well.             She is s/p 2 cycles of cisplatin 100 mg/2.  She is s/p 2 weeks of cisplatin 30 mg/m2.  Cumulative dose of cisplatin is 260 mg/m2.  Discuss plan for 1 additional week of cisplatin 30 mg/m2 if counts acceptable.   Patient denies any worsening of tinnitus/hearing loss.   Patient in agreement.             Radiation due to complete on 05/25/2019.  Plan additional time for improvement in WBC prior to last weekly cycle of cisplatin but within 7 day radiation window.  Discuss symptom management.  She has antiemetics and pain medications at home to use on a prn bases.  Interventions are adequate.    3.   High frequency hearing loss and tinnitus  Discuss changes in hearing after cycle #2 cisplatin 100 mg/m2.  Patient notes no progression of symptoms after 2 weeks of cisplatin 30 mg/m2.  Review hearing loss may be permanent.  Patient consents to  treatment next week. 4.   No cisplatin in [employer]. 5.   RTC in [place] on 05/31/2019 for labs (CBC with diff, CMP, Mg) and last dose of weekly cisplatin. 6.   RTC 06/07/2019 in [place] for MD assessment and labs (CBC with diff, CMP, Mg).  I discussed the assessment and treatment plan with the patient.  The patient was  provided an opportunity to ask questions and all were answered.  The patient agreed with the plan and demonstrated an understanding of the instructions.  The patient was advised to call back if the symptoms worsen or if the condition fails to improve as anticipated.   Lequita Asal, MD, PhD    05/24/2019, 9:45 AM  I, Selena Batten, am acting as scribe for [employer]. Mike Gip, MD, PhD.  I, Melissa C. Mike Gip, MD, have reviewed the above documentation for accuracy and completeness, and I agree with the above.

## 2019-05-24 ENCOUNTER — Ambulatory Visit
Admission: RE | Admit: 2019-05-24 | Discharge: 2019-05-24 | Disposition: A | Discharge status: Home / Self Care | Payer: Medicaid Other | Source: Ambulatory Visit | Attending: Radiation Oncology | Admitting: Radiation Oncology

## 2019-05-24 ENCOUNTER — Inpatient Hospital Stay (HOSPITAL_BASED_OUTPATIENT_CLINIC_OR_DEPARTMENT_OTHER): Payer: Medicaid Other | Admitting: Hematology and Oncology

## 2019-05-24 ENCOUNTER — Inpatient Hospital Stay: Payer: Medicaid Other

## 2019-05-24 ENCOUNTER — Other Ambulatory Visit: Payer: Self-pay

## 2019-05-24 ENCOUNTER — Encounter: Payer: Self-pay | Admitting: Hematology and Oncology

## 2019-05-24 VITALS — BP 122/76 | HR 69 | Temp 97.5°F | Resp 16 | Wt 156.2 lb

## 2019-05-24 DIAGNOSIS — Z5111 Encounter for antineoplastic chemotherapy: Secondary | ICD-10-CM

## 2019-05-24 DIAGNOSIS — C01 Malignant neoplasm of base of tongue: Secondary | ICD-10-CM

## 2019-05-24 DIAGNOSIS — Z7189 Other specified counseling: Secondary | ICD-10-CM

## 2019-05-24 DIAGNOSIS — Z510 Encounter for antineoplastic radiation therapy: Secondary | ICD-10-CM | POA: Diagnosis not present

## 2019-05-24 DIAGNOSIS — H9313 Tinnitus, bilateral: Secondary | ICD-10-CM

## 2019-05-24 LAB — CBC WITH DIFFERENTIAL/PLATELET
Abs Immature Granulocytes: 0.02 10*3/uL (ref 0.00–0.07)
Basophils Absolute: 0 10*3/uL (ref 0.0–0.1)
Basophils Relative: 0 %
Eosinophils Absolute: 0 10*3/uL (ref 0.0–0.5)
Eosinophils Relative: 0 %
HCT: 30.6 % — ABNORMAL LOW (ref 36.0–46.0)
Hemoglobin: 10.2 g/dL — ABNORMAL LOW (ref 12.0–15.0)
Immature Granulocytes: 1 %
Lymphocytes Relative: 11 %
Lymphs Abs: 0.3 10*3/uL — ABNORMAL LOW (ref 0.7–4.0)
MCH: 30.8 pg (ref 26.0–34.0)
MCHC: 33.3 g/dL (ref 30.0–36.0)
MCV: 92.4 fL (ref 80.0–100.0)
Monocytes Absolute: 0.4 10*3/uL (ref 0.1–1.0)
Monocytes Relative: 15 %
Neutro Abs: 1.8 10*3/uL (ref 1.7–7.7)
Neutrophils Relative %: 73 %
Platelets: 146 10*3/uL — ABNORMAL LOW (ref 150–400)
RBC: 3.31 MIL/uL — ABNORMAL LOW (ref 3.87–5.11)
RDW: 15.4 % (ref 11.5–15.5)
WBC: 2.5 10*3/uL — ABNORMAL LOW (ref 4.0–10.5)
nRBC: 0 % (ref 0.0–0.2)

## 2019-05-24 LAB — COMPREHENSIVE METABOLIC PANEL
ALT: 31 U/L (ref 0–44)
AST: 24 U/L (ref 15–41)
Albumin: 4.1 g/dL (ref 3.5–5.0)
Alkaline Phosphatase: 65 U/L (ref 38–126)
Anion gap: 9 (ref 5–15)
BUN: 23 mg/dL — ABNORMAL HIGH (ref 6–20)
CO2: 27 mmol/L (ref 22–32)
Calcium: 9.3 mg/dL (ref 8.9–10.3)
Chloride: 102 mmol/L (ref 98–111)
Creatinine, Ser: 0.84 mg/dL (ref 0.44–1.00)
GFR calc Af Amer: >60 mL/min (ref >60)
GFR calc non Af Amer: >60 mL/min (ref >60)
Glucose, Bld: 126 mg/dL — ABNORMAL HIGH (ref 70–99)
Potassium: 5 mmol/L (ref 3.5–5.1)
Sodium: 138 mmol/L (ref 135–145)
Total Bilirubin: 0.7 mg/dL (ref 0.3–1.2)
Total Protein: 6.6 g/dL (ref 6.5–8.1)

## 2019-05-24 LAB — MAGNESIUM: Magnesium: 2.1 mg/dL (ref 1.7–2.4)

## 2019-05-24 NOTE — Progress Notes (Signed)
Patient here for follow up. Denies any concerns.  

## 2019-05-24 NOTE — Progress Notes (Signed)

## 2019-05-25 ENCOUNTER — Ambulatory Visit
Admission: RE | Admit: 2019-05-25 | Discharge: 2019-05-25 | Disposition: A | Discharge status: Home / Self Care | Payer: Medicaid Other | Source: Ambulatory Visit | Attending: Radiation Oncology | Admitting: Radiation Oncology

## 2019-05-25 ENCOUNTER — Inpatient Hospital Stay: Payer: Medicaid Other

## 2019-05-25 ENCOUNTER — Encounter: Payer: Self-pay | Admitting: Pharmacy Technician

## 2019-05-25 DIAGNOSIS — Z510 Encounter for antineoplastic radiation therapy: Secondary | ICD-10-CM | POA: Diagnosis not present

## 2019-05-25 NOTE — Progress Notes (Signed)
Patient has been approved for drug assistance by [employer] for [employer]. The enrollment period is from 05/21/19-05/20/20 based on self pay. First DOS covered is 05/12/19 with retro period.

## 2019-05-27 ENCOUNTER — Emergency Department
Admission: EM | Admit: 2019-05-27 | Discharge: 2019-05-28 | Disposition: A | Discharge status: Home / Self Care | Payer: Medicaid Other | Attending: Emergency Medicine | Admitting: Emergency Medicine

## 2019-05-27 ENCOUNTER — Other Ambulatory Visit: Payer: Self-pay

## 2019-05-27 ENCOUNTER — Encounter: Payer: Self-pay | Admitting: Emergency Medicine

## 2019-05-27 ENCOUNTER — Emergency Department: Payer: Medicaid Other

## 2019-05-27 DIAGNOSIS — Z85828 Personal history of other malignant neoplasm of skin: Secondary | ICD-10-CM | POA: Diagnosis not present

## 2019-05-27 DIAGNOSIS — J029 Acute pharyngitis, unspecified: Secondary | ICD-10-CM | POA: Diagnosis not present

## 2019-05-27 DIAGNOSIS — Z9221 Personal history of antineoplastic chemotherapy: Secondary | ICD-10-CM | POA: Insufficient documentation

## 2019-05-27 DIAGNOSIS — Z923 Personal history of irradiation: Secondary | ICD-10-CM | POA: Insufficient documentation

## 2019-05-27 DIAGNOSIS — R6510 Systemic inflammatory response syndrome (SIRS) of non-infectious origin without acute organ dysfunction: Secondary | ICD-10-CM | POA: Diagnosis not present

## 2019-05-27 DIAGNOSIS — N390 Urinary tract infection, site not specified: Secondary | ICD-10-CM | POA: Diagnosis not present

## 2019-05-27 DIAGNOSIS — Z79899 Other long term (current) drug therapy: Secondary | ICD-10-CM | POA: Insufficient documentation

## 2019-05-27 DIAGNOSIS — C140 Malignant neoplasm of pharynx, unspecified: Secondary | ICD-10-CM | POA: Diagnosis not present

## 2019-05-27 LAB — COMPREHENSIVE METABOLIC PANEL
ALT: 27 U/L (ref 0–44)
AST: 25 U/L (ref 15–41)
Albumin: 3.8 g/dL (ref 3.5–5.0)
Alkaline Phosphatase: 55 U/L (ref 38–126)
Anion gap: 13 (ref 5–15)
BUN: 21 mg/dL — ABNORMAL HIGH (ref 6–20)
CO2: 24 mmol/L (ref 22–32)
Calcium: 8.8 mg/dL — ABNORMAL LOW (ref 8.9–10.3)
Chloride: 99 mmol/L (ref 98–111)
Creatinine, Ser: 0.81 mg/dL (ref 0.44–1.00)
GFR calc Af Amer: >60 mL/min (ref >60)
GFR calc non Af Amer: >60 mL/min (ref >60)
Glucose, Bld: 97 mg/dL (ref 70–99)
Potassium: 3.5 mmol/L (ref 3.5–5.1)
Sodium: 136 mmol/L (ref 135–145)
Total Bilirubin: 0.8 mg/dL (ref 0.3–1.2)
Total Protein: 6.5 g/dL (ref 6.5–8.1)

## 2019-05-27 LAB — LACTIC ACID, PLASMA: Lactic Acid, Venous: 1.1 mmol/L (ref 0.5–1.9)

## 2019-05-27 LAB — CBC WITH DIFFERENTIAL/PLATELET
Abs Immature Granulocytes: 0.02 10*3/uL (ref 0.00–0.07)
Basophils Absolute: 0 10*3/uL (ref 0.0–0.1)
Basophils Relative: 0 %
Eosinophils Absolute: 0 10*3/uL (ref 0.0–0.5)
Eosinophils Relative: 0 %
HCT: 29.7 % — ABNORMAL LOW (ref 36.0–46.0)
Hemoglobin: 10 g/dL — ABNORMAL LOW (ref 12.0–15.0)
Immature Granulocytes: 1 %
Lymphocytes Relative: 9 %
Lymphs Abs: 0.3 10*3/uL — ABNORMAL LOW (ref 0.7–4.0)
MCH: 31 pg (ref 26.0–34.0)
MCHC: 33.7 g/dL (ref 30.0–36.0)
MCV: 92 fL (ref 80.0–100.0)
Monocytes Absolute: 0.5 10*3/uL (ref 0.1–1.0)
Monocytes Relative: 16 %
Neutro Abs: 2.5 10*3/uL (ref 1.7–7.7)
Neutrophils Relative %: 74 %
Platelets: 192 10*3/uL (ref 150–400)
RBC: 3.23 MIL/uL — ABNORMAL LOW (ref 3.87–5.11)
RDW: 15.6 % — ABNORMAL HIGH (ref 11.5–15.5)
WBC: 3.4 10*3/uL — ABNORMAL LOW (ref 4.0–10.5)
nRBC: 0 % (ref 0.0–0.2)

## 2019-05-27 LAB — URINALYSIS, COMPLETE (UACMP) WITH MICROSCOPIC
Bilirubin Urine: NEGATIVE
Glucose, UA: NEGATIVE mg/dL
Hgb urine dipstick: NEGATIVE
Ketones, ur: 20 mg/dL — AB
Nitrite: NEGATIVE
Protein, ur: NEGATIVE mg/dL
Specific Gravity, Urine: 1.014 (ref 1.005–1.030)
pH: 5 (ref 5.0–8.0)

## 2019-05-27 LAB — PROCALCITONIN: Procalcitonin: <0.1 ng/mL

## 2019-05-27 LAB — PROTIME-INR
INR: 1 (ref 0.8–1.2)
Prothrombin Time: 13.4 seconds (ref 11.4–15.2)

## 2019-05-27 LAB — LIPASE, BLOOD: Lipase: 21 U/L (ref 11–51)

## 2019-05-27 LAB — APTT: aPTT: 28 seconds (ref 24–36)

## 2019-05-27 MED ORDER — ACETAMINOPHEN 325 MG PO TABS
650.0000 mg | ORAL_TABLET | Freq: Once | ORAL | Status: DC
  Filled 2019-05-27: qty 2

## 2019-05-27 MED ORDER — KETOROLAC TROMETHAMINE 30 MG/ML IJ SOLN
15.0000 mg | INTRAMUSCULAR | Status: AC
  Administered 2019-05-27: 15 mg via INTRAVENOUS
  Filled 2019-05-27: qty 1

## 2019-05-27 MED ORDER — MORPHINE SULFATE (PF) 4 MG/ML IV SOLN
4.0000 mg | Freq: Once | INTRAVENOUS | Status: AC
  Administered 2019-05-27: 4 mg via INTRAVENOUS

## 2019-05-27 MED ORDER — SODIUM CHLORIDE 0.9 % IV SOLN
2.0000 g | INTRAVENOUS | Status: DC
  Administered 2019-05-27: 2 g via INTRAVENOUS
  Filled 2019-05-27: qty 20

## 2019-05-27 MED ORDER — SODIUM CHLORIDE 0.9 % IV BOLUS
1000.0000 mL | Freq: Once | INTRAVENOUS | Status: AC
  Administered 2019-05-27: 20:00:00 1000 mL via INTRAVENOUS

## 2019-05-27 MED ORDER — SODIUM CHLORIDE 0.9 % IV SOLN
500.0000 mg | INTRAVENOUS | Status: DC
  Administered 2019-05-27: 500 mg via INTRAVENOUS
  Filled 2019-05-27: qty 500

## 2019-05-27 MED ORDER — MORPHINE SULFATE (PF) 4 MG/ML IV SOLN
INTRAVENOUS | Status: AC
  Filled 2019-05-27: qty 1

## 2019-05-27 MED ORDER — MORPHINE SULFATE (PF) 4 MG/ML IV SOLN
4.0000 mg | Freq: Once | INTRAVENOUS | Status: AC
  Administered 2019-05-27: 4 mg via INTRAVENOUS
  Filled 2019-05-27: qty 1

## 2019-05-27 NOTE — Consult Note (Signed)
PHARMACY -  BRIEF ANTIBIOTIC NOTE   Pharmacy has received consult(s) for CAP from an ED provider.  The patient's profile has been reviewed for ht/wt/allergies/indication/available labs.    One time order(s) placed for ceftriaxone and azithromycin  Further antibiotics/pharmacy consults should be ordered by admitting physician if indicated.                       Thank you, Oswald Hillock 05/27/2019  8:04 PM

## 2019-05-27 NOTE — Consult Note (Signed)
CODE SEPSIS - PHARMACY COMMUNICATION  **Broad Spectrum Antibiotics should be administered within 1 hour of Sepsis diagnosis**  Time Code Sepsis Called/Page Received: 2006  Antibiotics Ordered: ceftriaxone and azithromycin   Time of 1st antibiotic administration: 2018    Oswald Hillock ,PharmD Clinical Pharmacist  05/27/2019  8:22 PM

## 2019-05-27 NOTE — Discharge Instructions (Addendum)
1.  Take antibiotic as prescribed (Keflex 500 mg 3 times daily x7 days). 2.  Return to the ER for worsening symptoms, persistent vomiting, difficulty breathing or other concerns.

## 2019-05-27 NOTE — ED Triage Notes (Signed)
Pt presents to ED c/o worsening sore throat. Pt has hx tongue cancer, is currently getting chemo and radiation. Pt is febrile and tachycardic, has a port. Pt brought straight back to room 1.

## 2019-05-27 NOTE — ED Provider Notes (Signed)
[facility] Emergency Department Provider Note  ____________________________________________  Time seen: Approximately 11:28 PM  I have reviewed the triage vital signs and the nursing notes.   HISTORY  Chief Complaint Sore Throat    HPI Connie Cameron is a 59 y.o. female with a history of throat cancer on chemotherapy and radiation therapy who comes ED complaining of worsening sore throat over the past 2 days since her most recent radiation treatment.  Denies fever chills body aches sweats chest pain shortness of breath difficulty swallowing or breathing.  Normal energy level, no vomiting or diarrhea.  Otherwise feels fine except for her sore throat which is constant, severe, no aggravating or alleviating factors..      Past Medical History:  Diagnosis Date  . Throat cancer [facility]) 2021     Patient Active Problem List   Diagnosis Date Noted  . Encounter for antineoplastic chemotherapy 05/18/2019  . Chemotherapy induced neutropenia ([place]) 05/11/2019  . Bronchitis 05/03/2019  . 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 Acute pharyngitis 05/02/2019  . Cellulitis 04/02/2019  . Goals of care, counseling/discussion 02/28/2019  . 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 Lymphadenopathy, cervical 12/28/2018  . Acute leg pain, left 04/22/2014  . Atypical chest pain 04/22/2014  . Hx of syncope 04/22/2014  . SOB (shortness of breath) 04/22/2014     Past Surgical History:  Procedure Laterality Date  . EXCISION MASS NECK Left 01/18/2019   Procedure: EXCISION MASS NECK/NODE;  Surgeon: Margaretha Sheffield, MD;  Location: [facility] ORS;  Service: ENT;  Laterality: Left;  . LARYNGOSCOPY Bilateral 02/22/2019   Procedure: MICROSCOPIC DIRECT LARYNGOSCOPY AND BIOPSY;  Surgeon: Margaretha Sheffield, MD;  Location: [facility] ORS;  Service: ENT;  Laterality: Bilateral;  . PORTA CATH INSERTION N/A 03/24/2019   Procedure: PORTA CATH  INSERTION;  Surgeon: Katha Cabal, MD;  Location: [facility];  Service: Cardiovascular;  Laterality: N/A;  . TONSILLECTOMY    . TUBAL LIGATION       Prior to Admission medications   Medication Sig Start Date End Date Taking? Authorizing Provider  famotidine (PEPCID) 20 MG tablet Take 1 tablet (20 mg total) by mouth daily. 05/05/19 05/04/20 Yes Nolberto Hanlon, MD  LORazepam (ATIVAN) 0.5 MG tablet Take 1 tablet (0.5 mg total) by mouth every 6 (six) hours as needed (Nausea or vomiting). 05/11/19  Yes Sindy Guadeloupe, MD  Morphine Sulfate (MORPHINE CONCENTRATE) 10 mg / 0.5 ml concentrated solution Take 0.5 mLs (10 mg total) by mouth every 4 (four) hours as needed for severe pain. 05/11/19  Yes Sindy Guadeloupe, MD  ondansetron (ZOFRAN) 8 MG tablet Take 1 tablet (8 mg total) by mouth 2 (two) times daily as needed. Start on the third day after chemotherapy. 03/23/19  Yes Corcoran, Drue Second, MD  sucralfate (CARAFATE) 1 g tablet Take 1 tablet (1 g total) by mouth 3 (three) times daily before meals. Dissolve tablet in warm water, swish and swallow. 04/13/19  Yes Chrystal, Eulas Post, MD  lidocaine-prilocaine (EMLA) cream Apply to affected area once 03/23/19   Lequita Asal, MD     Allergies Patient has no known allergies.   Family History  Problem Relation Age of Onset  . Breast cancer Sister   . Brain cancer Paternal Grandfather     Social History Social History   Tobacco Use  . Smoking status: Never Smoker  . Smokeless tobacco: Never Used  Substance  Use Topics  . Alcohol use: Not Currently  . Drug use: No    Review of Systems  Constitutional:   No fever or chills.  ENT:   Positive sore throat. No rhinorrhea. Cardiovascular:   No chest pain or syncope. Respiratory:   No dyspnea or cough. Gastrointestinal:   Negative for abdominal pain, vomiting and diarrhea.  Musculoskeletal:   Negative for focal pain or swelling All other systems reviewed and are negative except as  documented above in ROS and HPI.  ____________________________________________   PHYSICAL EXAM:  VITAL SIGNS: ED Triage Vitals  Enc Vitals Group     BP 05/27/19 1946 (!) 151/78     Pulse Rate 05/27/19 1946 (!) 106     Resp 05/27/19 1946 18     Temp 05/27/19 1946 (!) 100.9 F (38.3 C)     Temp Source 05/27/19 1946 Oral     SpO2 05/27/19 1946 97 %     Weight 05/27/19 1953 156 lb 8.4 oz (71 kg)     Height 05/27/19 1953 5\' 3"  (1.6 m)     Head Circumference --      Peak Flow --      Pain Score 05/27/19 1947 10     Pain Loc --      Pain Edu? --      Excl. in [place]? --     Vital signs reviewed, nursing assessments reviewed.   Constitutional:   Alert and oriented. Non-toxic appearance. Eyes:   Conjunctivae are normal. EOMI. PERRL. ENT      Head:   Normocephalic and atraumatic.      Nose: Normal.      Mouth/Throat:   Moist mucous membranes, no peritonsillar mass or uvular deviation.  Normal-appearing oropharynx      Neck:   No meningismus. Full ROM.  Thyroid nonpalpable Hematological/Lymphatic/Immunilogical:   No cervical lymphadenopathy. Cardiovascular:   Tachycardia heart rate 105. Symmetric bilateral radial and DP pulses.  No murmurs. Cap refill less than 2 seconds. Respiratory:   Normal respiratory effort without tachypnea/retractions. Breath sounds are clear and equal bilaterally. No wheezes/rales/rhonchi. Gastrointestinal:   Soft and nontender. Non distended. There is no CVA tenderness.  No rebound, rigidity, or guarding. Musculoskeletal:   Normal range of motion in all extremities. No joint effusions.  No lower extremity tenderness.  No edema. Neurologic:   Normal speech and language.  Motor grossly intact. No acute focal neurologic deficits are appreciated.  Skin:    Skin is warm, dry and intact. No rash noted.  No petechiae, purpura, or bullae.  ____________________________________________    LABS (pertinent positives/negatives) (all labs ordered are listed, but only  abnormal results are displayed) Labs Reviewed  CBC WITH DIFFERENTIAL/PLATELET - Abnormal; Notable for the following components:      Result Value   WBC 3.4 (*)    RBC 3.23 (*)    Hemoglobin 10.0 (*)    HCT 29.7 (*)    RDW 15.6 (*)    Lymphs Abs 0.3 (*)    All other components within normal limits  COMPREHENSIVE METABOLIC PANEL - Abnormal; Notable for the following components:   BUN 21 (*)    Calcium 8.8 (*)    All other components within normal limits  CULTURE, BLOOD (ROUTINE X 2)  CULTURE, BLOOD (ROUTINE X 2)  URINE CULTURE  LACTIC ACID, PLASMA  PROTIME-INR  LIPASE, BLOOD  PROCALCITONIN  APTT  URINALYSIS, COMPLETE (UACMP) WITH MICROSCOPIC  POC URINE PREG, ED   ____________________________________________   EKG  ____________________________________________    RADIOLOGY  DG Chest Port 1 View  Result Date: 05/27/2019 CLINICAL DATA:  Fever in the setting of carcinoma of the tongue EXAM: PORTABLE CHEST 1 VIEW COMPARISON:  05/02/2019 FINDINGS: The heart size and mediastinal contours are within normal limits. Both lungs are clear. The visualized skeletal structures are unremarkable. Unchanged position of right chest wall Port-A-Cath with tip in the mid SVC via an internal jugular vein approach. IMPRESSION: No active disease. Electronically Signed   By: Ulyses Jarred M.D.   On: 05/27/2019 20:23    ____________________________________________   PROCEDURES Procedures  ____________________________________________  DIFFERENTIAL DIAGNOSIS   Radiation mucositis, immunosuppression with sepsis due to UTI or pneumonia, dehydration, SIRS secondary to radiation treatment  CLINICAL IMPRESSION / ASSESSMENT AND PLAN / ED COURSE  Medications ordered in the ED: Medications  cefTRIAXone (ROCEPHIN) 2 g in sodium chloride 0.9 % 100 mL IVPB (0 g Intravenous Stopped 05/27/19 2054)  azithromycin (ZITHROMAX) 500 mg in sodium chloride 0.9 % 250 mL IVPB (0 mg Intravenous Stopped 05/27/19  2153)  sodium chloride 0.9 % bolus 1,000 mL (0 mLs Intravenous Stopped 05/27/19 2138)  morphine 4 MG/ML injection 4 mg (4 mg Intravenous Given 05/27/19 2052)  morphine 4 MG/ML injection 4 mg (4 mg Intravenous Given 05/27/19 2301)  ketorolac (TORADOL) 30 MG/ML injection 15 mg (15 mg Intravenous Given 05/27/19 2301)    Pertinent labs & imaging results that were available during my care of the patient were reviewed by me and considered in my medical decision making (see chart for details).  Connie Cameron was evaluated in Emergency Department on 05/27/2019 for the symptoms described in the history of present illness. She was evaluated in the context of the global COVID-19 pandemic, which necessitated consideration that the patient might be at risk for infection with the SARS-CoV-2 virus that causes COVID-19. Institutional protocols and algorithms that pertain to the evaluation of patients at risk for COVID-19 are in a state of rapid change based on information released by regulatory bodies including the CDC and federal and state organizations. These policies and algorithms were followed during the patient's care in the ED.   Patient presents with fever tachycardia in the setting of chemo and radiation treatment for throat cancer.  Concern for immunosuppression causing opportunistic infection or sepsis.  Code sepsis initiated, will give ceftriaxone and follow-up lab panel.  1 L IV saline bolus for hydration.  Clinical Course as of May 26 2332  Thu May 27, 2019  2041 Lactate normal, bp normal. No signs of shock. ANC normal, not immunosuppressed.  Lactic Acid, Venous: 1.1 [PS]  2042 Cxr unremarkable.    [PS]  2326 Lab workup all reassuring. Awaiting UA, will rx abx as indicated. Pt is stable for DC home, lab results not c/w sepsis. Will DC code sepsis.   [PS]    Clinical Course User Index [PS] Carrie Mew, MD     ____________________________________________   FINAL CLINICAL IMPRESSION(S) /  ED DIAGNOSES    Final diagnoses:  SIRS (systemic inflammatory response syndrome) ([place])  Throat cancer [facility])     ED Discharge Orders    None      Portions of this note were generated with dragon dictation software. Dictation errors may occur despite best attempts at proofreading.   Carrie Mew, MD 05/27/19 [phone]

## 2019-05-28 ENCOUNTER — Inpatient Hospital Stay (HOSPITAL_BASED_OUTPATIENT_CLINIC_OR_DEPARTMENT_OTHER): Payer: Medicaid Other | Admitting: Hematology and Oncology

## 2019-05-28 VITALS — BP 124/80 | HR 95 | Temp 98.2°F | Resp 16 | Wt 156.4 lb

## 2019-05-28 DIAGNOSIS — K1230 Oral mucositis (ulcerative), unspecified: Secondary | ICD-10-CM

## 2019-05-28 DIAGNOSIS — H9313 Tinnitus, bilateral: Secondary | ICD-10-CM

## 2019-05-28 DIAGNOSIS — Z7189 Other specified counseling: Secondary | ICD-10-CM

## 2019-05-28 DIAGNOSIS — Z5111 Encounter for antineoplastic chemotherapy: Secondary | ICD-10-CM | POA: Diagnosis not present

## 2019-05-28 DIAGNOSIS — C01 Malignant neoplasm of base of tongue: Secondary | ICD-10-CM

## 2019-05-28 MED ORDER — CEPHALEXIN 250 MG/5ML PO SUSR: 500.0000 mg | Freq: Three times a day (TID) | ORAL | 0 refills | Status: AC

## 2019-05-28 NOTE — Progress Notes (Signed)
Patient here for follow up. States "my throat and mouth feel raw" since yesterday AM and increased last night. Patient dx. With UTI and is prescribed Keflex. Reports she is picking it up today. Denies any other concerns.

## 2019-05-28 NOTE — ED Provider Notes (Signed)
-----------------------------------------   12:24 AM on 05/28/2019 -----------------------------------------  Updated patient of urinalysis results.  Sepsis work-up including lactic acid and procalcitonin negative.  She has already been dosed with IV Rocephin and azithromycin.  Will discharge on Keflex and patient will follow up closely with her PCP.  States she has Magic mouthwash and Morphine at home.  Prefers liquid medications.  Strict return precautions given.  Patient verbalizes understanding agrees with plan of care.   Paulette Blanch, MD 05/28/19 [phone]

## 2019-05-28 NOTE — Progress Notes (Signed)
[facility] [place]  [address], [address] [place], [place] [address] Phone: [phone]  Fax: [phone]   Clinic Day:  05/28/2019  Referring physician: No ref. provider found  Chief Complaint: Connie Cameron is a 59 y.o. 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   HPI: The patient was last seen in the medical oncology clinic on 05/24/2019. At that time, she felt "good".  She is eating soft food, fruits and vegetables and drinking Ensure.  WBC is 2500 (ANC 1800).  We decided to postpone treatment to allow additional time for count recovery.  She continued radiation.  Radiation completed on 05/25/2019.  She was seen at [facility] in the ED on 05/27/2019 with complaints of a worsening sore throat over 2 days.  She presented with fever and tachycardia. Code sepsis was initiated. She received ceftriaxone and 1 L IV saline bolus. CXR  revealed no active disease. She was discharged with Keflex.  Hematocrit was 29.7, hemoglobin 10.0, MCV 92.0, platelets 192,000, WBC 3400 with an ANC 2500.   During the interim, she has felt "ok".  She states that she woke up feeling unwell on Wednesday morning (05/26/2019). She had a fever of 100.6 yesterday. She was diagnosed with a UTI yesterday but she says she denied any symptoms of a UTI.  Today she feels fine other than her throat being sore. She has been drinking water in small sips. She has also been drinking Ensure.  Her hearing is about the same. She has trouble talking due to the ulcers in her mouth and gum inflammation. She states that the last time she felt like this, it took her 3-4 days to improve.    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    Past Surgical History:  Procedure Laterality Date   EXCISION MASS NECK Left 01/18/2019   Procedure: EXCISION MASS NECK/NODE;  Surgeon: Margaretha Sheffield, MD;  Location: [facility] ORS;   Service: ENT;  Laterality: Left;   LARYNGOSCOPY Bilateral 02/22/2019   Procedure: MICROSCOPIC DIRECT LARYNGOSCOPY AND BIOPSY;  Surgeon: Margaretha Sheffield, MD;  Location: [facility] ORS;  Service: ENT;  Laterality: Bilateral;   PORTA CATH INSERTION N/A 03/24/2019   Procedure: PORTA CATH INSERTION;  Surgeon: Katha Cabal, MD;  Location: [facility];  Service: Cardiovascular;  Laterality: N/A;   TONSILLECTOMY     TUBAL LIGATION      Family History  Problem Relation Age of Onset   Breast cancer Sister    Brain cancer Paternal Grandfather     Social History:  reports that she has never smoked. She has never used smokeless tobacco. She reports previous alcohol use. She reports that she does not use drugs. She use to drink years ago. She denies any exposure to radiation or toxins. 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 She lives in [place]. The patient is alone  today.  Allergies: No Known Allergies  Current Medications: Current Outpatient Medications  Medication Sig Dispense Refill   famotidine (PEPCID) 20 MG tablet Take 1 tablet (20 mg total) by mouth daily. 30 tablet 0   lidocaine-prilocaine (EMLA) cream Apply to affected area once 30 g 3   LORazepam (ATIVAN) 0.5 MG tablet Take 1 tablet (0.5 mg total) by mouth every 6 (six) hours as needed (Nausea or vomiting). 30 tablet 0   Morphine Sulfate (MORPHINE CONCENTRATE)  10 mg / 0.5 ml concentrated solution Take 0.5 mLs (10 mg total) by mouth every 4 (four) hours as needed for severe pain. 30 mL 0   sucralfate (CARAFATE) 1 g tablet Take 1 tablet (1 g total) by mouth 3 (three) times daily before meals. Dissolve tablet in warm water, swish and swallow. 90 tablet 6   cephALEXin (KEFLEX) 250 MG/5ML suspension Take 10 mLs (500 mg total) by mouth 3 (three) times daily for 7 days. (Patient not taking: Reported on 05/28/2019) 210 mL 0   ondansetron (ZOFRAN) 8 MG tablet Take 1 tablet (8 mg total) by mouth 2  (two) times daily as needed. Start on the third day after chemotherapy. (Patient not taking: Reported on 05/28/2019) 30 tablet 1   No current facility-administered medications for this visit.    Review of Systems  Constitutional: Negative for chills, diaphoresis, fever, malaise/fatigue and weight loss (stable).  HENT: Positive for hearing loss (high frequency, improved), sore throat and tinnitus. Negative for congestion, ear pain, nosebleeds and sinus pain.   Eyes: Negative for blurred vision (stable) and double vision.  Respiratory: Negative.  Negative for cough, hemoptysis, sputum production and shortness of breath.   Cardiovascular: Negative.  Negative for chest pain, palpitations and leg swelling.  Gastrointestinal: Negative for abdominal pain, blood in stool, constipation, diarrhea, heartburn, melena, nausea (mild, usually after treatment) and vomiting.       Drinking sips of water and Ensure.   Genitourinary: Negative for dysuria, frequency and urgency.       UTI on Keflex.  Musculoskeletal: Negative.  Negative for back pain, falls, joint pain and myalgias.  Skin: Negative for itching and rash.  Neurological: Positive for speech change (secondary to mouth and tongue tenderness). Negative for dizziness, sensory change, weakness and headaches.  Endo/Heme/Allergies: Negative.  Does not bruise/bleed easily.  Psychiatric/Behavioral: Negative for depression and memory loss. The patient is not nervous/anxious and does not have insomnia.   All other systems reviewed and are negative.  Performance status (ECOG): 1 - Symptomatic but completely ambulatory  Vitals Blood pressure 124/80, pulse 95, temperature 98.2 F (36.8 C), temperature source Tympanic, resp. rate 16, weight 156 lb 6.7 oz (70.9 kg), SpO2 100 %.   Physical Exam  Constitutional: She is oriented to person, place, and time. She appears well-developed and well-nourished. No distress. Face mask in place.  Speech has been affected  by oral ulceration.  HENT:  Head: Normocephalic and atraumatic.  Mouth/Throat: Oropharynx is clear and moist and mucous membranes are normal. No oral lesions.  Long brown/blonde hair.  Inflamed gums (right > left side). Ulcer on right side of the tongue. Mask.  Eyes: Pupils are equal, round, and reactive to light. Conjunctivae and EOM are normal. Right eye exhibits no discharge. Left eye exhibits no discharge. No scleral icterus.  Blue eyes.  Neck: No JVD present.  Cardiovascular: Normal rate, regular rhythm and normal heart sounds. Exam reveals no gallop and no friction rub.  No murmur heard. Pulmonary/Chest: Effort normal and breath sounds normal. She has no wheezes. She has no rhonchi. She has no rales.  Abdominal: Soft. Normal appearance and bowel sounds are normal. She exhibits no distension and no mass. There is no hepatosplenomegaly. There is no abdominal tenderness. There is no rebound, no guarding and no CVA tenderness.  Musculoskeletal:        General: No tenderness or edema. Normal range of motion.     Cervical back: Normal range of motion and neck supple.  Lymphadenopathy:  Head (right side): No preauricular, no posterior auricular and no occipital adenopathy present.       Head (left side): No preauricular, no posterior auricular and no occipital adenopathy present.    She has no cervical adenopathy.       Right cervical: No superficial cervical adenopathy present.   She has no axillary adenopathy.       Right: No inguinal and no supraclavicular adenopathy present.       Left: No inguinal and no supraclavicular adenopathy present.  Neurological: She is alert and oriented to person, place, and time.  Skin: Skin is warm, dry and intact. No bruising, no lesion and no rash noted. She is not diaphoretic. There is erythema (neck s/p radiation). No pallor.  Psychiatric: She has a normal mood and affect. Her behavior is normal. Judgment and thought content normal.  Nursing note  and vitals reviewed.   Admission on 05/27/2019, Discharged on 05/28/2019  Component Date Value Ref Range Status   Lactic Acid, Venous 05/27/2019 1.1  0.5 - 1.9 mmol/L Final   Performed at [facility], [address] [place], [place] [address]   WBC 05/27/2019 3.4* 4.0 - 10.5 K/uL Final   RBC 05/27/2019 3.23* 3.87 - 5.11 MIL/uL Final   Hemoglobin 05/27/2019 10.0* 12.0 - 15.0 g/dL Final   HCT 05/27/2019 29.7* 36.0 - 46.0 % Final   MCV 05/27/2019 92.0  80.0 - 100.0 fL Final   MCH 05/27/2019 31.0  26.0 - 34.0 pg Final   MCHC 05/27/2019 33.7  30.0 - 36.0 g/dL Final   RDW 05/27/2019 15.6* 11.5 - 15.5 % Final   Platelets 05/27/2019 192  150 - 400 K/uL Final   nRBC 05/27/2019 0.0  0.0 - 0.2 % Final   Neutrophils Relative % 05/27/2019 74  % Final   Neutro Abs 05/27/2019 2.5  1.7 - 7.7 K/uL Final   Lymphocytes Relative 05/27/2019 9  % Final   Lymphs Abs 05/27/2019 0.3* 0.7 - 4.0 K/uL Final   Monocytes Relative 05/27/2019 16  % Final   Monocytes Absolute 05/27/2019 0.5  0.1 - 1.0 K/uL Final   Eosinophils Relative 05/27/2019 0  % Final   Eosinophils Absolute 05/27/2019 0.0  0.0 - 0.5 K/uL Final   Basophils Relative 05/27/2019 0  % Final   Basophils Absolute 05/27/2019 0.0  0.0 - 0.1 K/uL Final   Immature Granulocytes 05/27/2019 1  % Final   Abs Immature Granulocytes 05/27/2019 0.02  0.00 - 0.07 K/uL Final   Performed at [facility], [address] [place], [place] [address]   Prothrombin Time 05/27/2019 13.4  11.4 - 15.2 seconds Final   INR 05/27/2019 1.0  0.8 - 1.2 Final   Comment: (NOTE) INR goal varies based on device and disease states. Performed at [facility], [address] [place], [place] [address]    Specimen Description 05/27/2019 BLOOD Blood Culture adequate volume   Final   Special Requests 05/27/2019 BOTTLES DRAWN AEROBIC AND ANAEROBIC LEFT ANTECUBITAL   Final   Culture 05/27/2019    Final                    Value:NO GROWTH < 12 HOURS Performed at [facility], [address] [place], [place] [address]    Report Status 05/27/2019 PENDING   Incomplete   Specimen Description 05/27/2019 BLOOD Blood Culture adequate volume   Final   Special Requests 05/27/2019 BOTTLES DRAWN AEROBIC AND ANAEROBIC LEFT ANTECUBITAL   Final  Culture 05/27/2019    Final                   Value:NO GROWTH < 12 HOURS Performed at [facility], [address] [place], [place] [address]    Report Status 05/27/2019 PENDING   Incomplete   Lipase 05/27/2019 21  11 - 51 U/L Final   Performed at [facility], [address] [place],  [address]   Procalcitonin 05/27/2019 <0.10  ng/mL Final   Comment:        Interpretation: PCT (Procalcitonin) <= 0.5 ng/mL: Systemic infection (sepsis) is not likely. Local bacterial infection is possible. (NOTE)       Sepsis PCT Algorithm           Lower Respiratory Tract                                      Infection PCT Algorithm    ----------------------------     ----------------------------         PCT < 0.25 ng/mL                PCT < 0.10 ng/mL         Strongly encourage             Strongly discourage   discontinuation of antibiotics    initiation of antibiotics    ----------------------------     -----------------------------       PCT 0.25 - 0.50 ng/mL            PCT 0.10 - 0.25 ng/mL               OR       >80% decrease in PCT            Discourage initiation of                                            antibiotics      Encourage discontinuation           of antibiotics    ----------------------------     -----------------------------         PCT >= 0.50 ng/mL              PCT 0.26 - 0.50 ng/mL               AND                                 <80% decrease in PCT             Encourage initiation of                                             antibiotics       Encourage continuation           of antibiotics     ----------------------------     -----------------------------        PCT >= 0.50 ng/mL                  PCT > 0.50 ng/mL  AND         increase in PCT                  Strongly encourage                                      initiation of antibiotics    Strongly encourage escalation           of antibiotics                                     -----------------------------                                           PCT <= 0.25 ng/mL                                                 OR                                        > 80% decrease in PCT                                     Discontinue / Do not initiate                                             antibiotics Performed at [facility], [address] [place], [place] [address]    aPTT 05/27/2019 28  24 - 36 seconds Final   Performed at [facility], [place], [place] [address]   Color, Urine 05/27/2019 YELLOW* YELLOW Final   APPearance 05/27/2019 HAZY* CLEAR Final   Specific Gravity, Urine 05/27/2019 1.014  1.005 - 1.030 Final   pH 05/27/2019 5.0  5.0 - 8.0 Final   Glucose, UA 05/27/2019 NEGATIVE  NEGATIVE mg/dL Final   Hgb urine dipstick 05/27/2019 NEGATIVE  NEGATIVE Final   Bilirubin Urine 05/27/2019 NEGATIVE  NEGATIVE Final   Ketones, ur 05/27/2019 20* NEGATIVE mg/dL Final   Protein, ur 05/27/2019 NEGATIVE  NEGATIVE mg/dL Final   Nitrite 05/27/2019 NEGATIVE  NEGATIVE Final   Leukocytes,Ua 05/27/2019 MODERATE* NEGATIVE Final   RBC / HPF 05/27/2019 0-5  0 - 5 RBC/hpf Final   WBC, UA 05/27/2019 0-5  0 - 5 WBC/hpf Final   Bacteria, UA 05/27/2019 RARE* NONE SEEN Final   Squamous Epithelial / LPF 05/27/2019 0-5  0 - 5 Final   Mucus 05/27/2019 PRESENT   Final   Performed at [facility], [address] [place], [place] [address]   Sodium 05/27/2019 136  135 - 145 mmol/L Final   Potassium 05/27/2019 3.5  3.5 - 5.1 mmol/L Final   Chloride 05/27/2019 99  98  - 111 mmol/L Final   CO2 05/27/2019 24  22 -  32 mmol/L Final   Glucose, Bld 05/27/2019 97  70 - 99 mg/dL Final   Glucose reference range applies only to samples taken after fasting for at least 8 hours.   BUN 05/27/2019 21* 6 - 20 mg/dL Final   Creatinine, Ser 05/27/2019 0.81  0.44 - 1.00 mg/dL Final   Calcium 05/27/2019 8.8* 8.9 - 10.3 mg/dL Final   Total Protein 05/27/2019 6.5  6.5 - 8.1 g/dL Final   Albumin 05/27/2019 3.8  3.5 - 5.0 g/dL Final   AST 05/27/2019 25  15 - 41 U/L Final   ALT 05/27/2019 27  0 - 44 U/L Final   Alkaline Phosphatase 05/27/2019 55  38 - 126 U/L Final   Total Bilirubin 05/27/2019 0.8  0.3 - 1.2 mg/dL Final   GFR calc non Af Amer 05/27/2019 >60  >60 mL/min Final   GFR calc Af Amer 05/27/2019 >60  >60 mL/min Final   Anion gap 05/27/2019 13  5 - 15 Final   Performed at [facility], [address] [place], [place] [address]    Assessment:  MAJESTA SEVERN is a 59 y.o. 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 Pathologyrevealed poorly differentiated squamous cell carcinoma. Malignant cells are positive for p16, p40, and pancytokeratin.   She presented with left cervical adenopathy. 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 Node was 4.0 x 2.3 x 1.8 cm.  Neck CTon 12/21/2018 revealed a 2.4 x 2.1 x 3.4 cm lymph node in the left upper neck. The lymph node showed heterogeneous enhancement and stranding in the surrounding fat. There was a 6 mm left upper posterior lymph node and a 7 mm right level 3 lymph node. Imaging was concerning for malignancy. There were no other enlarged lymph nodes. There was no pharyngeal mass.   PET scan on 02/04/2019 revealed prior left level II cervical node had been excised. There were no findings on PET to suggest a  site of primary head/neck cancer. There was symmetric hypermetabolism along the base of the tongue bilaterally favored to be physiologic. There was no evidence of metastatic disease.   Audiogramon 03/11/2019 revealed normal hearing in the right earthrough 8 kHz with mild hearing loss at 12 kHz only. Hearing was within normal limits in the left earthrough 3 kHz with mild-severe hearing loss from 4-12 kHz. Audiogram on 04/30/2019 revealed significant decrease in high-frequency hearing. 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  She received radiation from 03/30/2019 - 05/25/2019. She is s/p 2 cycles of cisplatin100 mg/m2 (03/30/2019 - 04/20/2019).  She received weekly cisplatin 30 mg/m2 x2 (05/11/2019 and 05/18/2019).  She received filgrastim-sndz support on 05/12/2019 and 05/13/2019.   She was admitted to [place] 04/02/2019 -04/06/2019 with cellulitis involving the trapezius and right supraclavicular area. Port-a-cath was uninvolved. She received cefepime and vancomycin. She was discharged on doxycycline.  She was admitted to [facility] from 05/02/2019 to 05/05/2019 with fever of 100.5 and radiation mucositis. Patient was unable to eat or drink fluids secondary to sore throat.  Radiation was held. She was started on Decadron 4 mg BID.   Symptomatically, she painful mouth and recent fever.  She is on Keflex for a UTI.  Exam reveals right tongue ulceration and inflamed gums.  WBC is 3400 ([place] 2500).  Plan: 1.   Review labs from the ER. 2.Stage I left  base of tongue carcinoma  Clinically, she has sequelae from radiation. She is s/p 2 cycles of cisplatin 100 mg/2.             She is s/p 1 week of cisplatin 30 mg/m2.  Cumulative dose of cisplatin is 260 mg/m2.             Discuss original plan for 1 additional week of cisplatin 30 mg/m2 if counts acceptable.   Patient denies any worsening of tinnitus/hearing loss.  Patient has  recurrent oral ulceration. Radiation completed on 05/25/2019.             Discuss allowing additional time for recovery and possible last dose of weekly cisplatin.             Discuss symptom management.  She has antiemetics and pain medications at home to use on a prn bases.  Interventions are adequate.    3.   High frequency hearing loss and tinnitus             Discuss changes in hearing after cycle #2 cisplatin 100 mg/m2.             Patient notes no progression of symptoms after 2 weeks of cisplatin 30 mg/m2.             Hearing has improved.  Consideration will be made next week for possible last weekly dose of cisplatin. 4.   Mucositis and oral ulceration  Etiology secondary to radiation.  Patient has Magic mouthwash.  She is able to drink fluids.  Patient to contact clinic if symptoms worsen and unable to maintain hydration.  No cisplatin in [employer]. 5.   Cancel chemotherapy in [place] on Monday. 6.   RTC on 06/02/2019 in [place] (1st appt) for MD assessment, labs (CBC with diff, CMP, Mg), and +/- cisplatin.  I discussed the assessment and treatment plan with the patient.  The patient was provided an opportunity to ask questions and all were answered.  The patient agreed with the plan and demonstrated an understanding of the instructions.  The patient was advised to call back if the symptoms worsen or if the condition fails to improve as anticipated.   Lequita Asal, MD, PhD    05/28/2019, 3:22 PM  I, Heywood Footman, am acting as Education administrator for [employer]. Mike Gip, MD, PhD.  I, Zayden Hahne C. Mike Gip, MD, have reviewed the above documentation for accuracy and completeness, and I agree with the above.

## 2019-05-29 LAB — URINE CULTURE: Culture: NO GROWTH

## 2019-05-31 ENCOUNTER — Inpatient Hospital Stay: Payer: Medicaid Other

## 2019-05-31 DIAGNOSIS — K1230 Oral mucositis (ulcerative), unspecified: Secondary | ICD-10-CM | POA: Insufficient documentation

## 2019-05-31 NOTE — Progress Notes (Signed)
[facility]  [address] [place], [place] [address] Phone: [phone]  Fax: [phone]   Clinic Day:  06/02/2019  Referring physician: No ref. provider found  Chief Complaint: Connie Cameron is a 59 y.o. 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    HPI: The patient was last seen in the medical oncology clinic on 05/28/2019. At that time, she had a painful mouth and recent fever. She was on Keflex for a UTI.  Exam revealed right tongue ulceration and inflamed gums. WBC was 3400 ([place] 2500).  Symptomatically, she says she is doing of kind of good today. The past week has been good for her. She was able to eat oatmeal this morning and her gums are not as inflamed as they were during her last visit. Her hearing is about the same. She notes she has a follow up with Dr. Baruch Gouty sometime in 06/2019. She denies any blood in her stool or urine.    Past Medical History:  Diagnosis Date  . Throat cancer ([place]) 2021    Past Surgical History:  Procedure Laterality Date  . EXCISION MASS NECK Left 01/18/2019   Procedure: EXCISION MASS NECK/NODE;  Surgeon: Margaretha Sheffield, MD;  Location: [facility];  Service: ENT;  Laterality: Left;  . LARYNGOSCOPY Bilateral 02/22/2019   Procedure: MICROSCOPIC DIRECT LARYNGOSCOPY AND BIOPSY;  Surgeon: Margaretha Sheffield, MD;  Location: [facility];  Service: ENT;  Laterality: Bilateral;  . PORTA CATH INSERTION N/A 03/24/2019   Procedure: PORTA CATH INSERTION;  Surgeon: Katha Cabal, MD;  Location: [facility];  Service: Cardiovascular;  Laterality: N/A;  . TONSILLECTOMY    . TUBAL LIGATION      Family History  Problem Relation Age of Onset  . Breast cancer Sister   . Brain cancer Paternal Grandfather     Social History:  reports that she has never smoked. She has never used smokeless tobacco. She reports previous alcohol  use. She reports that she does not use drugs. She use to drink years ago. She denies any exposure to radiation or toxins. 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 She lives in [place]. The patient is alone  today.  Allergies: No Known Allergies  Current Medications: Current Outpatient Medications  Medication Sig Dispense Refill  . cephALEXin (KEFLEX) 250 MG/5ML suspension Take 10 mLs (500 mg total) by mouth 3 (three) times daily for 7 days. 210 mL 0  . famotidine (PEPCID) 20 MG tablet Take 1 tablet (20 mg total) by mouth daily. 30 tablet 0  . lidocaine-prilocaine (EMLA) cream Apply to affected area once 30 g 3  . LORazepam (ATIVAN) 0.5 MG tablet Take 1 tablet (0.5 mg total) by mouth every 6 (six) hours as needed (Nausea or vomiting). 30 tablet 0  . Morphine Sulfate (MORPHINE CONCENTRATE) 10 mg / 0.5 ml concentrated solution Take 0.5 mLs (10 mg total) by mouth every 4 (four) hours as needed for severe pain. 30 mL 0  . sucralfate (CARAFATE) 1 g tablet Take 1 tablet (1 g total) by mouth 3 (three) times daily before meals. Dissolve tablet in warm water, swish and swallow. 90 tablet 6  . ondansetron (ZOFRAN) 8 MG tablet Take 1 tablet (8 mg total) by mouth 2 (two) times daily as needed. Start on the third day after chemotherapy. (Patient not taking: Reported on 05/28/2019)  30 tablet 1   No current facility-administered medications for this visit.   Facility-Administered Medications Ordered in Other Visits  Medication Dose Route Frequency Provider Last Rate Last Admin  . heparin lock flush 100 unit/mL  500 Units Intravenous Once Corcoran, Melissa C, MD      . sodium chloride flush (NS) 0.9 % injection 10 mL  10 mL Intravenous PRN Lequita Asal, MD        Review of Systems  Constitutional: Positive for weight loss (6 lb). Negative for chills, diaphoresis, fever and malaise/fatigue.       " I am good"  HENT: Positive for hearing loss (high frequency,  improved), sore throat (tongue better) and tinnitus. Negative for congestion, ear pain, nosebleeds and sinus pain.   Eyes: Negative for blurred vision (stable) and double vision.  Respiratory: Negative.  Negative for cough, hemoptysis, sputum production and shortness of breath.   Cardiovascular: Negative.  Negative for chest pain, palpitations and leg swelling.  Gastrointestinal: Negative for abdominal pain, blood in stool, constipation, diarrhea, heartburn, melena, nausea (mild) and vomiting.       Drinking sips of water and Ensure.   Genitourinary: Negative for dysuria, frequency and urgency.  Musculoskeletal: Negative.  Negative for back pain, falls, joint pain and myalgias.  Skin: Negative for itching and rash.  Neurological: Negative for dizziness, sensory change, speech change, weakness and headaches.  Endo/Heme/Allergies: Negative.  Does not bruise/bleed easily.  Psychiatric/Behavioral: Negative for depression and memory loss. The patient is not nervous/anxious and does not have insomnia.   All other systems reviewed and are negative.  Performance status (ECOG): 1 - Symptomatic but completely ambulatory  Vitals Blood pressure 114/80, pulse 71, temperature (!) 97.3 F (36.3 C), temperature source Tympanic, resp. rate 18, height 5\' 3"  (1.6 m), weight 150 lb 9.2 oz (68.3 kg), SpO2 100 %.   Physical Exam  Constitutional: She is oriented to person, place, and time. She appears well-developed. No distress. Face mask in place.  HENT:  Head: Normocephalic and atraumatic.  Mouth/Throat: Oropharynx is clear and moist and mucous membranes are normal. No oral lesions.  Long brown/blonde hair.  Gums have improved. Ulcer on right side of the tongue, resolved. Mask.  Eyes: Pupils are equal, round, and reactive to light. Conjunctivae and EOM are normal. Right eye exhibits no discharge. Left eye exhibits no discharge. No scleral icterus.  Blue eyes.  Neck: No JVD present.  Cardiovascular: Normal  rate, regular rhythm and normal heart sounds. Exam reveals no gallop and no friction rub.  No murmur heard. Pulmonary/Chest: Effort normal and breath sounds normal. She has no wheezes. She has no rhonchi. She has no rales.  Abdominal: Soft. Normal appearance and bowel sounds are normal. She exhibits no distension and no mass. There is no hepatosplenomegaly. There is no abdominal tenderness. There is no rebound and no guarding.  Musculoskeletal:        General: No tenderness or edema. Normal range of motion.     Cervical back: Normal range of motion and neck supple.  Lymphadenopathy:       Head (right side): No preauricular, no posterior auricular and no occipital adenopathy present.       Head (left side): No preauricular, no posterior auricular and no occipital adenopathy present.    She has no cervical adenopathy.    She has no axillary adenopathy.       Right: No inguinal and no supraclavicular adenopathy present.       Left: No inguinal  and no supraclavicular adenopathy present.  Neurological: She is alert and oriented to person, place, and time.  Skin: Skin is warm, dry and intact. No bruising, no lesion and no rash noted. She is not diaphoretic. No erythema. No pallor.  Psychiatric: She has a normal mood and affect. Her behavior is normal. Judgment and thought content normal.  Nursing note and vitals reviewed.   Infusion on 06/02/2019  Component Date Value Ref Range Status  . Magnesium 06/02/2019 1.9  1.7 - 2.4 mg/dL Final   Performed at [facility], [address], [place], [place] [address]  . Sodium 06/02/2019 137  135 - 145 mmol/L Final  . Potassium 06/02/2019 4.0  3.5 - 5.1 mmol/L Final  . Chloride 06/02/2019 101  98 - 111 mmol/L Final  . CO2 06/02/2019 23  22 - 32 mmol/L Final  . Glucose, Bld 06/02/2019 70  70 - 99 mg/dL Final   Glucose reference range applies only to samples taken after fasting for at least 8 hours.  . BUN 06/02/2019 18  6 - 20 mg/dL Final  .  Creatinine, Ser 06/02/2019 0.77  0.44 - 1.00 mg/dL Final  . Calcium 06/02/2019 9.3  8.9 - 10.3 mg/dL Final  . Total Protein 06/02/2019 6.9  6.5 - 8.1 g/dL Final  . Albumin 06/02/2019 3.9  3.5 - 5.0 g/dL Final  . AST 06/02/2019 56* 15 - 41 U/L Final  . ALT 06/02/2019 43  0 - 44 U/L Final  . Alkaline Phosphatase 06/02/2019 56  38 - 126 U/L Final  . Total Bilirubin 06/02/2019 1.1  0.3 - 1.2 mg/dL Final  . GFR calc non Af Amer 06/02/2019 >60  >60 mL/min Final  . GFR calc Af Amer 06/02/2019 >60  >60 mL/min Final  . Anion gap 06/02/2019 13  5 - 15 Final   Performed at [facility], [address], [place], [place] [address]  . WBC 06/02/2019 2.2* 4.0 - 10.5 K/uL Final  . RBC 06/02/2019 2.74* 3.87 - 5.11 MIL/uL Final  . Hemoglobin 06/02/2019 8.4* 12.0 - 15.0 g/dL Final  . HCT 06/02/2019 25.5* 36.0 - 46.0 % Final  . MCV 06/02/2019 93.1  80.0 - 100.0 fL Final  . MCH 06/02/2019 30.7  26.0 - 34.0 pg Final  . MCHC 06/02/2019 32.9  30.0 - 36.0 g/dL Final  . RDW 06/02/2019 16.1* 11.5 - 15.5 % Final  . Platelets 06/02/2019 161  150 - 400 K/uL Final  . nRBC 06/02/2019 0.0  0.0 - 0.2 % Final  . Neutrophils Relative % 06/02/2019 65  % Final  . Neutro Abs 06/02/2019 1.4* 1.7 - 7.7 K/uL Final  . Lymphocytes Relative 06/02/2019 15  % Final  . Lymphs Abs 06/02/2019 0.3* 0.7 - 4.0 K/uL Final  . Monocytes Relative 06/02/2019 18  % Final  . Monocytes Absolute 06/02/2019 0.4  0.1 - 1.0 K/uL Final  . Eosinophils Relative 06/02/2019 0  % Final  . Eosinophils Absolute 06/02/2019 0.0  0.0 - 0.5 K/uL Final  . Basophils Relative 06/02/2019 1  % Final  . Basophils Absolute 06/02/2019 0.0  0.0 - 0.1 K/uL Final  . Immature Granulocytes 06/02/2019 1  % Final  . Abs Immature Granulocytes 06/02/2019 0.03  0.00 - 0.07 K/uL Final   Performed at [facility], [address], [place], [place] [address]    Assessment:  BREELYN BUTCHART is a 59 y.o. female with clinical stage T1N1M0 left base of  tongue poorly differentiated  squamous cell carcinomas/p biopsy on 02/22/2019. Pathologyrevealed poorly differentiated squamous cell carcinoma. Malignant cells are positive for p16, p40, and pancytokeratin.   She presented with left cervical adenopathy. 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 Node was 4.0 x 2.3 x 1.8 cm.  Neck CTon 12/21/2018 revealed a 2.4 x 2.1 x 3.4 cm lymph node in the left upper neck. The lymph node showed heterogeneous enhancement and stranding in the surrounding fat. There was a 6 mm left upper posterior lymph node and a 7 mm right level 3 lymph node. Imaging was concerning for malignancy. There were no other enlarged lymph nodes. There was no pharyngeal mass.   PET scan on 02/04/2019 revealed prior left level II cervical node had been excised. There were no findings on PET to suggest a site of primary head/neck cancer. There was symmetric hypermetabolism along the base of the tongue bilaterally favored to be physiologic. There was no evidence of metastatic disease.   Audiogramon 03/11/2019 revealed normal hearing in the right earthrough 8 kHz with mild hearing loss at 12 kHz only. Hearing was within normal limits in the left earthrough 3 kHz with mild-severe hearing loss from 4-12 kHz. Audiogram on 04/30/2019 revealed significant decrease in high-frequency hearing. 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  She received radiation from 03/30/2019 - 05/25/2019. She is s/p 2 cycles of cisplatin100 mg/m2 (03/30/2019 - 04/20/2019).  She received weekly cisplatin 30 mg/m2 x2 (05/11/2019 and 05/18/2019).  She received filgrastim-sndz support on 05/12/2019 and 05/13/2019.   She was admitted to [facility] 04/02/2019 -04/06/2019 with cellulitis involving the trapezius and right supraclavicular area. Port-a-cath was uninvolved.  She received cefepime and vancomycin. She was discharged on doxycycline.  She was admitted to [facility] from 05/02/2019 to 05/05/2019 with fever of 100.5 and radiation mucositis. Patient was unable to eat or drink fluids secondary to sore throat.  Radiation was held. She was started on Decadron 4 mg BID.   Symptomatically, she continues to improve.  Oral ulceration has resolved.  She is eating soft foods.  Hemoglobin is 8.1, platelets 161,000, WBC 2200 with an ANC of 1400.  Plan: 1.   Labs today: CBC with diff, CMP, ferritin, iron studies, B12, folate, retic.  2.Stage I left base of tongue carcinoma  Clinically, she is recovering from radiation. She is s/p 2 cycles of cisplatin 100 mg/2.             She is s/p 2 weeks of cisplatin 30 mg/m2.  Cumulative dose of cisplatin is 260 mg/m2.             Radiation completed on 05/25/2019.             Given current counts and symptomatology, discuss no further chemotherapy.             Discuss symptom management.  She has antiemetics and pain medications at home to use on a prn bases.  Interventions are adequate.   .    3.   High frequency hearing loss and tinnitus             High-frequency hearing loss and tinnitus appears to be stable.  No further cis-platinum. 4.   Mucositis and oral ulceration  Radiation changes have improved.  Continue Magic mouthwash.  She is able to drink fluids and eat soft foods. 5.   Anemia  Etiology felt secondary to chemotherapy possibly poor  nutrition.    Anemia work-up today.   6.   Leukopenia   Etiology secondary to chemotherapy.    Re-review neutropenic precautions.   7.   No chemotherapy today. 8.   RTC in 1 week for labs (CBC, hold tube). 9.   RTC in 4 weeks for MD assessment and labs (CBC with diff, BMP, Mg).  I discussed the assessment and treatment plan with the patient.  The patient was provided an opportunity to ask questions and all were answered.  The patient agreed with the  plan and demonstrated an understanding of the instructions.  The patient was advised to call back if the symptoms worsen or if the condition fails to improve as anticipated.   Lequita Asal, MD, PhD    06/02/2019, 9:30 AM  I, Heywood Footman, am acting as a Education administrator for Lequita Asal, MD.  I, [place] Mike Gip, MD, have reviewed the above documentation for accuracy and completeness, and I agree with the above.

## 2019-06-01 LAB — CULTURE, BLOOD (ROUTINE X 2)
Culture: NO GROWTH
Culture: NO GROWTH
Specimen Description: ADEQUATE
Specimen Description: ADEQUATE

## 2019-06-02 ENCOUNTER — Other Ambulatory Visit: Payer: Self-pay

## 2019-06-02 ENCOUNTER — Inpatient Hospital Stay: Payer: Medicaid Other

## 2019-06-02 ENCOUNTER — Inpatient Hospital Stay (HOSPITAL_BASED_OUTPATIENT_CLINIC_OR_DEPARTMENT_OTHER): Payer: Medicaid Other | Admitting: Hematology and Oncology

## 2019-06-02 ENCOUNTER — Encounter: Payer: Self-pay | Admitting: Hematology and Oncology

## 2019-06-02 VITALS — BP 114/80 | HR 71 | Temp 97.3°F | Resp 18 | Ht 63.0 in | Wt 150.6 lb

## 2019-06-02 DIAGNOSIS — Z5111 Encounter for antineoplastic chemotherapy: Secondary | ICD-10-CM

## 2019-06-02 DIAGNOSIS — D649 Anemia, unspecified: Secondary | ICD-10-CM

## 2019-06-02 DIAGNOSIS — T451X5A Adverse effect of antineoplastic and immunosuppressive drugs, initial encounter: Secondary | ICD-10-CM

## 2019-06-02 DIAGNOSIS — D701 Agranulocytosis secondary to cancer chemotherapy: Secondary | ICD-10-CM

## 2019-06-02 DIAGNOSIS — Z7189 Other specified counseling: Secondary | ICD-10-CM

## 2019-06-02 DIAGNOSIS — C01 Malignant neoplasm of base of tongue: Secondary | ICD-10-CM

## 2019-06-02 DIAGNOSIS — H9313 Tinnitus, bilateral: Secondary | ICD-10-CM

## 2019-06-02 LAB — CBC WITH DIFFERENTIAL/PLATELET
Abs Immature Granulocytes: 0.03 10*3/uL (ref 0.00–0.07)
Basophils Absolute: 0 10*3/uL (ref 0.0–0.1)
Basophils Relative: 1 %
Eosinophils Absolute: 0 10*3/uL (ref 0.0–0.5)
Eosinophils Relative: 0 %
HCT: 25.5 % — ABNORMAL LOW (ref 36.0–46.0)
Hemoglobin: 8.4 g/dL — ABNORMAL LOW (ref 12.0–15.0)
Immature Granulocytes: 1 %
Lymphocytes Relative: 15 %
Lymphs Abs: 0.3 10*3/uL — ABNORMAL LOW (ref 0.7–4.0)
MCH: 30.7 pg (ref 26.0–34.0)
MCHC: 32.9 g/dL (ref 30.0–36.0)
MCV: 93.1 fL (ref 80.0–100.0)
Monocytes Absolute: 0.4 10*3/uL (ref 0.1–1.0)
Monocytes Relative: 18 %
Neutro Abs: 1.4 10*3/uL — ABNORMAL LOW (ref 1.7–7.7)
Neutrophils Relative %: 65 %
Platelets: 161 10*3/uL (ref 150–400)
RBC: 2.74 MIL/uL — ABNORMAL LOW (ref 3.87–5.11)
RDW: 16.1 % — ABNORMAL HIGH (ref 11.5–15.5)
WBC: 2.2 10*3/uL — ABNORMAL LOW (ref 4.0–10.5)
nRBC: 0 % (ref 0.0–0.2)

## 2019-06-02 LAB — COMPREHENSIVE METABOLIC PANEL
ALT: 43 U/L (ref 0–44)
AST: 56 U/L — ABNORMAL HIGH (ref 15–41)
Albumin: 3.9 g/dL (ref 3.5–5.0)
Alkaline Phosphatase: 56 U/L (ref 38–126)
Anion gap: 13 (ref 5–15)
BUN: 18 mg/dL (ref 6–20)
CO2: 23 mmol/L (ref 22–32)
Calcium: 9.3 mg/dL (ref 8.9–10.3)
Chloride: 101 mmol/L (ref 98–111)
Creatinine, Ser: 0.77 mg/dL (ref 0.44–1.00)
GFR calc Af Amer: >60 mL/min (ref >60)
GFR calc non Af Amer: >60 mL/min (ref >60)
Glucose, Bld: 70 mg/dL (ref 70–99)
Potassium: 4 mmol/L (ref 3.5–5.1)
Sodium: 137 mmol/L (ref 135–145)
Total Bilirubin: 1.1 mg/dL (ref 0.3–1.2)
Total Protein: 6.9 g/dL (ref 6.5–8.1)

## 2019-06-02 LAB — IRON AND TIBC
Iron: 76 ug/dL (ref 28–170)
Saturation Ratios: 39 % — ABNORMAL HIGH (ref 10.4–31.8)
TIBC: 197 ug/dL — ABNORMAL LOW (ref 250–450)
UIBC: 121 ug/dL

## 2019-06-02 LAB — RETICULOCYTES
Immature Retic Fract: 28.1 % — ABNORMAL HIGH (ref 2.3–15.9)
RBC.: 2.67 MIL/uL — ABNORMAL LOW (ref 3.87–5.11)
Retic Count, Absolute: 72.6 10*3/uL (ref 19.0–186.0)
Retic Ct Pct: 2.7 % (ref 0.4–3.1)

## 2019-06-02 LAB — FOLATE: Folate: 26 ng/mL (ref >5.9)

## 2019-06-02 LAB — MAGNESIUM: Magnesium: 1.9 mg/dL (ref 1.7–2.4)

## 2019-06-02 LAB — FERRITIN: Ferritin: 597 ng/mL — ABNORMAL HIGH (ref 11–307)

## 2019-06-02 LAB — VITAMIN B12: Vitamin B-12: 1209 pg/mL — ABNORMAL HIGH (ref 180–914)

## 2019-06-02 MED ORDER — HEPARIN SOD (PORK) LOCK FLUSH 100 UNIT/ML IV SOLN
500.0000 [IU] | Freq: Once | INTRAVENOUS | Status: AC
  Administered 2019-06-02: 500 [IU] via INTRAVENOUS
  Filled 2019-06-02: qty 5

## 2019-06-02 MED ORDER — SODIUM CHLORIDE 0.9% FLUSH
10.0000 mL | INTRAVENOUS | Status: DC | PRN
  Administered 2019-06-02: 10 mL via INTRAVENOUS
  Filled 2019-06-02: qty 10

## 2019-06-02 NOTE — Progress Notes (Signed)
Still on antibiotics at this time for prior UTI. No c/o today.

## 2019-06-03 ENCOUNTER — Other Ambulatory Visit: Payer: Self-pay

## 2019-06-03 ENCOUNTER — Ambulatory Visit: Payer: Self-pay | Admitting: Hematology and Oncology

## 2019-06-03 ENCOUNTER — Ambulatory Visit: Payer: Self-pay

## 2019-06-07 ENCOUNTER — Inpatient Hospital Stay: Payer: MEDICAID | Admitting: Hematology and Oncology

## 2019-06-07 ENCOUNTER — Inpatient Hospital Stay: Payer: MEDICAID

## 2019-06-09 ENCOUNTER — Telehealth: Payer: Self-pay

## 2019-06-09 ENCOUNTER — Other Ambulatory Visit: Payer: Self-pay

## 2019-06-09 ENCOUNTER — Inpatient Hospital Stay: Payer: Medicaid Other | Attending: Hematology and Oncology

## 2019-06-09 DIAGNOSIS — C01 Malignant neoplasm of base of tongue: Secondary | ICD-10-CM | POA: Insufficient documentation

## 2019-06-09 LAB — CBC WITH DIFFERENTIAL/PLATELET
Abs Immature Granulocytes: 0.01 10*3/uL (ref 0.00–0.07)
Basophils Absolute: 0 10*3/uL (ref 0.0–0.1)
Basophils Relative: 1 %
Eosinophils Absolute: 0 10*3/uL (ref 0.0–0.5)
Eosinophils Relative: 1 %
HCT: 27.4 % — ABNORMAL LOW (ref 36.0–46.0)
Hemoglobin: 9.1 g/dL — ABNORMAL LOW (ref 12.0–15.0)
Immature Granulocytes: 1 %
Lymphocytes Relative: 23 %
Lymphs Abs: 0.5 10*3/uL — ABNORMAL LOW (ref 0.7–4.0)
MCH: 31.7 pg (ref 26.0–34.0)
MCHC: 33.2 g/dL (ref 30.0–36.0)
MCV: 95.5 fL (ref 80.0–100.0)
Monocytes Absolute: 0.3 10*3/uL (ref 0.1–1.0)
Monocytes Relative: 14 %
Neutro Abs: 1.4 10*3/uL — ABNORMAL LOW (ref 1.7–7.7)
Neutrophils Relative %: 60 %
Platelets: 126 10*3/uL — ABNORMAL LOW (ref 150–400)
RBC: 2.87 MIL/uL — ABNORMAL LOW (ref 3.87–5.11)
RDW: 17.3 % — ABNORMAL HIGH (ref 11.5–15.5)
WBC: 2.2 10*3/uL — ABNORMAL LOW (ref 4.0–10.5)
nRBC: 0 % (ref 0.0–0.2)

## 2019-06-09 LAB — COMPREHENSIVE METABOLIC PANEL
ALT: 42 U/L (ref 0–44)
AST: 54 U/L — ABNORMAL HIGH (ref 15–41)
Albumin: 4 g/dL (ref 3.5–5.0)
Alkaline Phosphatase: 47 U/L (ref 38–126)
Anion gap: 13 (ref 5–15)
BUN: 16 mg/dL (ref 6–20)
CO2: 27 mmol/L (ref 22–32)
Calcium: 9.5 mg/dL (ref 8.9–10.3)
Chloride: 100 mmol/L (ref 98–111)
Creatinine, Ser: 0.96 mg/dL (ref 0.44–1.00)
GFR calc Af Amer: >60 mL/min (ref >60)
GFR calc non Af Amer: >60 mL/min (ref >60)
Glucose, Bld: 90 mg/dL (ref 70–99)
Potassium: 4.7 mmol/L (ref 3.5–5.1)
Sodium: 140 mmol/L (ref 135–145)
Total Bilirubin: 1.2 mg/dL (ref 0.3–1.2)
Total Protein: 6.2 g/dL — ABNORMAL LOW (ref 6.5–8.1)

## 2019-06-09 LAB — MAGNESIUM: Magnesium: 2 mg/dL (ref 1.7–2.4)

## 2019-06-09 NOTE — Telephone Encounter (Signed)
Spoke with the patient to inform her that her counts are stable, Per Dr Mike Gip continue neutropenic precautions. The patient was understanding and agreeable

## 2019-06-10 ENCOUNTER — Telehealth: Payer: Self-pay

## 2019-06-10 ENCOUNTER — Inpatient Hospital Stay: Payer: Medicaid Other

## 2019-06-10 NOTE — Telephone Encounter (Signed)
Nutrition  Called patient at scheduled nutrition phone visit (9am) today but no answer.  Left message with call back number.   Called patient again at 1:15 pm and no answer.    Will try and follow-up with patient as able.  Has RD contact information.   Connie Cameron, Connie Cameron, Connie Cameron Registered Dietitian [phone] (pager)

## 2019-06-27 DIAGNOSIS — D649 Anemia, unspecified: Secondary | ICD-10-CM | POA: Insufficient documentation

## 2019-06-29 ENCOUNTER — Other Ambulatory Visit: Payer: Self-pay | Admitting: *Deleted

## 2019-06-29 DIAGNOSIS — C01 Malignant neoplasm of base of tongue: Secondary | ICD-10-CM

## 2019-06-29 NOTE — Progress Notes (Signed)
[facility]  [address] [place], [place] [address] Phone: [phone]  Fax: [phone]   Clinic Day:  06/30/2019  Referring physician: No ref. provider found  Chief Complaint: Connie Cameron is a 59 y.o. 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   HPI: The patient was last seen in the medical oncology clinic on 06/02/2019.  At that time, she continued to improve. Oral ulceration had resolved. She was eating soft foods.  Hemoglobin was 8.4, platelets 161,000, WBC 2200 with an ANC of 1400.  Ferritin was 597 with an iron saturation of 39% and a TIBC 197.  B12 was 1209 and folate 26.0.  Given her counts and symptomatology, decision was made for no further chemotherapy (total cisplatin dose 260 mg/m2).  Labs on 06/09/2019 revealed a hematocrit of 27.4, hemoglobin 9.1, MCV 95.5, platelets 126,000, WBC 2200, ANC 1400.    During the interim, she has felt ok. She is finding it difficult to eat. She experiences nausea and heartburn whenever she eats. She sometimes takes Zofran. She has been taking Pepcid. She has not tried omeprazole, however agrees to getting it over the counter. She has recently been feeling cold most of the time. She complains of left inner ear pain. She describes it as a 'click' that has been ongoing for the last week.  Her sore throat and tongue tenderness has resolved. She is still drinking Boost and Ensure three times a day daily. She says everything tastes bland and bad to her. She has a dry mouth.   She has a follow-up with Dr. Baruch Gouty on 07/07/2019.    Past Medical History:  Diagnosis Date  . 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 EXCISION MASS NECK Left 01/18/2019   Procedure: EXCISION MASS NECK/NODE;  Surgeon: Margaretha Sheffield, MD;  Location: [facility] ORS;  Service: ENT;  Laterality: Left;   . LARYNGOSCOPY Bilateral 02/22/2019   Procedure: MICROSCOPIC DIRECT LARYNGOSCOPY AND BIOPSY;  Surgeon: Margaretha Sheffield, MD;  Location: [facility] OR[facility];  Service: ENT;  Laterality: Bilateral;  . PORTA CATH INSERTION N/A 03/24/2019   Procedure: PORTA CATH INSERTION;  Surgeon: Katha Cabal, MD;  Location: [facility];  Service: Cardiovascular;  Laterality: N/A;  . TONSILLECTOMY    . TUBAL LIGATION      Family History  Problem Relation Age of Onset  . Breast cancer Sister   . Brain cancer Paternal Grandfather     Social History:  reports that she has never smoked. She has never used smokeless tobacco. She reports previous alcohol use. She reports that she does not use drugs. She use to drink years ago. She denies any exposure to radiation or toxins. 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 She lives in [place]. The patient is alone today.  Allergies: No Known Allergies  Current Medications: Current Outpatient Medications  Medication Sig Dispense Refill  . famotidine (PEPCID) 20 MG tablet Take 1 tablet (20 mg total) by mouth daily. 30 tablet 0  . lidocaine-prilocaine (EMLA) cream Apply to affected area once 30 g 3  . LORazepam (ATIVAN) 0.5 MG tablet Take 1 tablet (0.5 mg total) by mouth every 6 (six) hours as needed (Nausea or vomiting). 30 tablet 0  . Morphine Sulfate (MORPHINE CONCENTRATE) 10 mg / 0.5 ml concentrated solution Take 0.5  mLs (10 mg total) by mouth every 4 (four) hours as needed for severe pain. 30 mL 0  . ondansetron (ZOFRAN) 8 MG tablet Take 1 tablet (8 mg total) by mouth 2 (two) times daily as needed. Start on the third day after chemotherapy. 30 tablet 1  . sucralfate (CARAFATE) 1 g tablet Take 1 tablet (1 g total) by mouth 3 (three) times daily before meals. Dissolve tablet in warm water, swish and swallow. 90 tablet 6   No current facility-administered medications for this visit.    Review of Systems  Constitutional: Negative for  chills, diaphoresis, fever, malaise/fatigue and weight loss (up 3 lb).       Doing 'ok'. Feeling cold most of the time.   HENT: Positive for ear pain (left ear ) and hearing loss (high frequency, improved). Negative for congestion, nosebleeds, sinus pain, sore throat (tongue better) and tinnitus.   Eyes: Negative.  Negative for blurred vision, double vision and photophobia.  Respiratory: Negative.  Negative for cough, hemoptysis, sputum production and shortness of breath.   Cardiovascular: Negative.  Negative for chest pain, palpitations and leg swelling.  Gastrointestinal: Positive for nausea (mild; after eating). Negative for abdominal pain, blood in stool, constipation, diarrhea, heartburn, melena and vomiting.       Having to force herself to eat but everything tastes bad to her. Drinking Boost and Ensure.  Genitourinary: Negative for dysuria, frequency and urgency.  Musculoskeletal: Negative.  Negative for back pain, falls, joint pain and myalgias.  Skin: Negative for itching and rash.  Neurological: Negative.  Negative for dizziness, tingling, sensory change, speech change, focal weakness, weakness and headaches.  Endo/Heme/Allergies: Negative.  Negative for environmental allergies. Does not bruise/bleed easily.  Psychiatric/Behavioral: Negative.  Negative for depression and memory loss. The patient is not nervous/anxious and does not have insomnia.   All other systems reviewed and are negative.  Performance status (ECOG): 1 - Symptomatic but completely ambulatory  Vitals Blood pressure 137/84, pulse 76, temperature 97.8 F (36.6 C), temperature source Tympanic, resp. rate 16, weight 153 lb 5.3 oz (69.6 kg), SpO2 100 %.   Physical Exam  Constitutional: She is oriented to person, place, and time. She appears well-developed and well-nourished. No distress. Face mask in place.  HENT:  Head: Normocephalic and atraumatic.  Right Ear: External ear and ear canal normal. Tympanic membrane is  not erythematous, not retracted and not bulging. No middle ear effusion.  Left Ear: External ear and ear canal normal. Tympanic membrane is not erythematous, not retracted and not bulging.  No middle ear effusion.  Mouth/Throat: Oropharynx is clear and moist and mucous membranes are normal. No oral lesions. No oropharyngeal exudate.  Long brown hair with blonde highlights.  Eyes: Pupils are equal, round, and reactive to light. Conjunctivae and EOM are normal. Right eye exhibits no discharge. Left eye exhibits no discharge. No scleral icterus.  Blue eyes.  Neck: No JVD present.  Cardiovascular: Normal rate, regular rhythm, normal heart sounds and intact distal pulses. Exam reveals no gallop and no friction rub.  No murmur heard. Pulmonary/Chest: Effort normal and breath sounds normal. She has no wheezes. She has no rhonchi. She has no rales.  Abdominal: Soft. Normal appearance and bowel sounds are normal. She exhibits no distension and no mass. There is no hepatosplenomegaly. There is no abdominal tenderness. There is no rebound and no guarding.  Musculoskeletal:        General: No tenderness or edema. Normal range of motion.  Cervical back: Normal range of motion and neck supple.  Lymphadenopathy:       Head (right side): No preauricular, no posterior auricular and no occipital adenopathy present.       Head (left side): No preauricular, no posterior auricular and no occipital adenopathy present.    She has no cervical adenopathy.    She has no axillary adenopathy.       Right: No inguinal and no supraclavicular adenopathy present.       Left: No inguinal and no supraclavicular adenopathy present.  Neurological: She is alert and oriented to person, place, and time.  Skin: Skin is warm, dry and intact. No bruising, no lesion and no rash noted. She is not diaphoretic. No erythema. No pallor.  Psychiatric: She has a normal mood and affect. Her behavior is normal. Judgment and thought content  normal.  Nursing note and vitals reviewed.   Appointment on 06/30/2019  Component Date Value Ref Range Status  . Sodium 06/30/2019 141  135 - 145 mmol/L Final  . Potassium 06/30/2019 3.9  3.5 - 5.1 mmol/L Final  . Chloride 06/30/2019 108  98 - 111 mmol/L Final  . CO2 06/30/2019 25  22 - 32 mmol/L Final  . Glucose, Bld 06/30/2019 128* 70 - 99 mg/dL Final   Glucose reference range applies only to samples taken after fasting for at least 8 hours.  . BUN 06/30/2019 10  6 - 20 mg/dL Final  . Creatinine, Ser 06/30/2019 0.70  0.44 - 1.00 mg/dL Final  . Calcium 06/30/2019 8.9  8.9 - 10.3 mg/dL Final  . GFR calc non Af Amer 06/30/2019 >60  >60 mL/min Final  . GFR calc Af Amer 06/30/2019 >60  >60 mL/min Final  . Anion gap 06/30/2019 8  5 - 15 Final   Performed at [facility], [address] [place], [place] [address]  . Magnesium 06/30/2019 2.2  1.7 - 2.4 mg/dL Final   Performed at [facility], [address], [place], [place] [address]  . WBC 06/30/2019 2.8* 4.0 - 10.5 K/uL Final  . RBC 06/30/2019 2.63* 3.87 - 5.11 MIL/uL Final  . Hemoglobin 06/30/2019 8.5* 12.0 - 15.0 g/dL Final  . HCT 06/30/2019 25.9* 36.0 - 46.0 % Final  . MCV 06/30/2019 98.5  80.0 - 100.0 fL Final  . MCH 06/30/2019 32.3  26.0 - 34.0 pg Final  . MCHC 06/30/2019 32.8  30.0 - 36.0 g/dL Final  . RDW 06/30/2019 15.7* 11.5 - 15.5 % Final  . Platelets 06/30/2019 187  150 - 400 K/uL Final  . nRBC 06/30/2019 0.0  0.0 - 0.2 % Final  . Neutrophils Relative % 06/30/2019 46  % Final  . Neutro Abs 06/30/2019 1.3* 1.7 - 7.7 K/uL Final  . Lymphocytes Relative 06/30/2019 35  % Final  . Lymphs Abs 06/30/2019 1.0  0.7 - 4.0 K/uL Final  . Monocytes Relative 06/30/2019 15  % Final  . Monocytes Absolute 06/30/2019 0.4  0.1 - 1.0 K/uL Final  . Eosinophils Relative 06/30/2019 3  % Final  . Eosinophils Absolute 06/30/2019 0.1  0.0 - 0.5 K/uL Final  . Basophils Relative 06/30/2019 1  % Final  . Basophils  Absolute 06/30/2019 0.0  0.0 - 0.1 K/uL Final  . Immature Granulocytes 06/30/2019 0  % Final  . Abs Immature Granulocytes 06/30/2019 0.01  0.00 - 0.07 K/uL Final   Performed at [facility], [address], [place], [place] [address]  Assessment:  PENNI KABACINSKI is a 59 y.o. 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 Pathologyrevealed poorly differentiated squamous cell carcinoma. Malignant cells are positive for p16, p40, and pancytokeratin.   She presented with left cervical adenopathy. 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 Node was 4.0 x 2.3 x 1.8 cm.  Neck CTon 12/21/2018 revealed a 2.4 x 2.1 x 3.4 cm lymph node in the left upper neck. The lymph node showed heterogeneous enhancement and stranding in the surrounding fat. There was a 6 mm left upper posterior lymph node and a 7 mm right level 3 lymph node. Imaging was concerning for malignancy. There were no other enlarged lymph nodes. There was no pharyngeal mass.   PET scan on 02/04/2019 revealed prior left level II cervical node had been excised. There were no findings on PET to suggest a site of primary head/neck cancer. There was symmetric hypermetabolism along the base of the tongue bilaterally favored to be physiologic. There was no evidence of metastatic disease.   Audiogramon 03/11/2019 revealed normal hearing in the right earthrough 8 kHz with mild hearing loss at 12 kHz only. Hearing was within normal limits in the left earthrough 3 kHz with mild-severe hearing loss from 4-12 kHz. Audiogram on 04/30/2019 revealed significant decrease in high-frequency hearing. 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  She received radiation from 03/30/2019 - 05/25/2019. She is s/p 2 cycles of  cisplatin100 mg/m2 (03/30/2019 - 04/20/2019).  She received weekly cisplatin 30 mg/m2 x2 (05/11/2019 and 05/18/2019).  She received filgrastim-sndz support on 05/12/2019 and 05/13/2019.   She was admitted to [facility] 04/02/2019 -04/06/2019 with cellulitis involving the trapezius and right supraclavicular area. Port-a-cath was uninvolved. She received cefepime and vancomycin. She was discharged on doxycycline.  She was admitted to [facility] from 05/02/2019 to 05/05/2019 with fever of 100.5 and radiation mucositis. Patient was unable to eat or drink fluids secondary to sore throat.  Radiation was held. She was started on Decadron 4 mg BID.   Symptomatically, she is doing "ok".  Food does not taste good.  Mouth is dry.  Oral lesions have resolved.  Weight has improved.    Plan: 1.   Labs today: CBC with diff, BMP, Mg, TSH, free T4. 2.Stage I left base of tongue carcinoma  Clinically, she is doing fairly well. She completed concurrent cisplatin and radiation .  Cumulative dose of cisplatin was 260 mg/m2.             Radiation completed on 05/25/2019.             She has xerostomia and change in taste affecting eating.             Discuss symptom management.  She has antiemetics at home to use on a prn bases.  Interventions are adequate.   .    3.   High frequency hearing loss and tinnitus             High-frequency hearing loss and tinnitus has improved.  She notes left ear symptoms with normal exam.  4.   Normocytic anemia  Hematocrit 25.9.  Hemoglobin 8.5.  MCV 98.5.  Normal labs on 06/02/2019 included ferritin, iron studies, B12, and folate.  No bleeding.  Etiology felt secondary to nutrition and possibly Pepcid.   Switch Pepcid to omeprazole.  Check thyroid  function studies.  6.   Leukopenia   WBC 2800 with an ANC of 1300.  WBC slightly improved from last month.  Etiology secondary to chemotherapy and possibly Pepcid.    Continue neutropenic precautions.    7.   Samples of Boost/Ensure. 8.   RTC in 4 weeks for MD assessment and labs (CBC with diff, CMP, copper, retic).  I discussed the assessment and treatment plan with the patient. The patient was provided an opportunity to ask questions and all were answered. The patient agreed with the plan and demonstrated an understanding of the instructions. The patient was advised to call back if the symptoms worsen or if the condition fails to improve as anticipated.   Lequita Asal, MD, PhD    06/30/2019, 9:33 AM  I, Heywood Footman, am acting as a Education administrator for Lequita Asal, MD.  I, [place] Mike Gip, MD, have reviewed the above documentation for accuracy and completeness, and I agree with the above.

## 2019-06-30 ENCOUNTER — Inpatient Hospital Stay: Payer: Medicaid Other

## 2019-06-30 ENCOUNTER — Other Ambulatory Visit: Payer: Self-pay

## 2019-06-30 ENCOUNTER — Inpatient Hospital Stay (HOSPITAL_BASED_OUTPATIENT_CLINIC_OR_DEPARTMENT_OTHER): Payer: Medicaid Other | Admitting: Hematology and Oncology

## 2019-06-30 ENCOUNTER — Encounter: Payer: Self-pay | Admitting: Hematology and Oncology

## 2019-06-30 VITALS — BP 137/84 | HR 76 | Temp 97.8°F | Resp 16 | Wt 153.3 lb

## 2019-06-30 DIAGNOSIS — C01 Malignant neoplasm of base of tongue: Secondary | ICD-10-CM | POA: Diagnosis not present

## 2019-06-30 DIAGNOSIS — D649 Anemia, unspecified: Secondary | ICD-10-CM

## 2019-06-30 DIAGNOSIS — H9313 Tinnitus, bilateral: Secondary | ICD-10-CM

## 2019-06-30 DIAGNOSIS — D72819 Decreased white blood cell count, unspecified: Secondary | ICD-10-CM

## 2019-06-30 LAB — CBC WITH DIFFERENTIAL/PLATELET
Abs Immature Granulocytes: 0.01 10*3/uL (ref 0.00–0.07)
Basophils Absolute: 0 10*3/uL (ref 0.0–0.1)
Basophils Relative: 1 %
Eosinophils Absolute: 0.1 10*3/uL (ref 0.0–0.5)
Eosinophils Relative: 3 %
HCT: 25.9 % — ABNORMAL LOW (ref 36.0–46.0)
Hemoglobin: 8.5 g/dL — ABNORMAL LOW (ref 12.0–15.0)
Immature Granulocytes: 0 %
Lymphocytes Relative: 35 %
Lymphs Abs: 1 10*3/uL (ref 0.7–4.0)
MCH: 32.3 pg (ref 26.0–34.0)
MCHC: 32.8 g/dL (ref 30.0–36.0)
MCV: 98.5 fL (ref 80.0–100.0)
Monocytes Absolute: 0.4 10*3/uL (ref 0.1–1.0)
Monocytes Relative: 15 %
Neutro Abs: 1.3 10*3/uL — ABNORMAL LOW (ref 1.7–7.7)
Neutrophils Relative %: 46 %
Platelets: 187 10*3/uL (ref 150–400)
RBC: 2.63 MIL/uL — ABNORMAL LOW (ref 3.87–5.11)
RDW: 15.7 % — ABNORMAL HIGH (ref 11.5–15.5)
WBC: 2.8 10*3/uL — ABNORMAL LOW (ref 4.0–10.5)
nRBC: 0 % (ref 0.0–0.2)

## 2019-06-30 LAB — BASIC METABOLIC PANEL
Anion gap: 8 (ref 5–15)
BUN: 10 mg/dL (ref 6–20)
CO2: 25 mmol/L (ref 22–32)
Calcium: 8.9 mg/dL (ref 8.9–10.3)
Chloride: 108 mmol/L (ref 98–111)
Creatinine, Ser: 0.7 mg/dL (ref 0.44–1.00)
GFR calc Af Amer: >60 mL/min (ref >60)
GFR calc non Af Amer: >60 mL/min (ref >60)
Glucose, Bld: 128 mg/dL — ABNORMAL HIGH (ref 70–99)
Potassium: 3.9 mmol/L (ref 3.5–5.1)
Sodium: 141 mmol/L (ref 135–145)

## 2019-06-30 LAB — TSH: TSH: 0.648 u[IU]/mL (ref 0.350–4.500)

## 2019-06-30 LAB — MAGNESIUM: Magnesium: 2.2 mg/dL (ref 1.7–2.4)

## 2019-06-30 LAB — T4, FREE: Free T4: 0.69 ng/dL (ref 0.61–1.12)

## 2019-06-30 MED ORDER — LORAZEPAM 0.5 MG PO TABS: 0.5000 mg | ORAL_TABLET | Freq: Four times a day (QID) | ORAL | 0 refills | Status: DC | PRN

## 2019-06-30 NOTE — Progress Notes (Signed)
Would like refill on ativan and pepcid. Ear (on the inside) starting hurting her off and on about a week ago. No ringing. She feels cold all the time. Patient has to force herself to eat then she gets nauseous and heartburn. She is doing boost and ensure.

## 2019-07-01 ENCOUNTER — Telehealth: Payer: Self-pay

## 2019-07-01 NOTE — Telephone Encounter (Signed)
Nutrition Follow-up:  Patient with base of the tongue cancer.  Patient has completed chemotherapy and radiation therapy.  Followed by Dr. Mike Gip.   Spoke with patient via phone for nutrition follow-up.  Patient reports that her appetite is better.  Reports soreness in mouth and throat has resolved.  Struggling with dry mouth and taste of foods.  "I am still eating though."  Reports yesterday she ate a hamburger and drank 3 ensures.     Medications: reviewed  Labs: reviewed  Anthropometrics:   Weight 153 lb 5.3 oz decreased from 162 lb 12.8 oz on 4/13 but increased from 150 lb 9.2 oz on 4/28 173 lb 8 oz per [facility] 180 lb 6 oz on 3/9 per [facility]   NUTRITION DIAGNOSIS: Predicted suboptimal energy intake continues  INTERVENTION:  Patient to continue to choose high calorie, high protein foods to prevent further weight loss.  Encouraged patient to continue 350 calorie shakes for added nutrition Patient has contact information    MONITORING, EVALUATION, GOAL: weight trends, intake   NEXT VISIT: July 1 phone f/u  Chasity Outten B. Zenia Resides, [place], [place] Registered Dietitian [phone] (pager)

## 2019-07-06 ENCOUNTER — Encounter: Payer: Self-pay | Admitting: Radiation Oncology

## 2019-07-06 ENCOUNTER — Other Ambulatory Visit: Payer: Self-pay

## 2019-07-07 ENCOUNTER — Other Ambulatory Visit: Payer: Self-pay | Admitting: *Deleted

## 2019-07-07 ENCOUNTER — Ambulatory Visit
Admission: RE | Admit: 2019-07-07 | Discharge: 2019-07-07 | Disposition: A | Discharge status: Home / Self Care | Payer: Medicaid Other | Source: Ambulatory Visit | Attending: Radiation Oncology | Admitting: Radiation Oncology

## 2019-07-07 VITALS — BP 142/90 | HR 84 | Temp 96.7°F | Resp 16 | Wt 150.4 lb

## 2019-07-07 DIAGNOSIS — Z9221 Personal history of antineoplastic chemotherapy: Secondary | ICD-10-CM | POA: Insufficient documentation

## 2019-07-07 DIAGNOSIS — Z923 Personal history of irradiation: Secondary | ICD-10-CM | POA: Diagnosis not present

## 2019-07-07 DIAGNOSIS — C01 Malignant neoplasm of base of tongue: Secondary | ICD-10-CM | POA: Diagnosis present

## 2019-07-07 NOTE — Progress Notes (Signed)
Radiation Oncology Follow up Note  Name: Connie Cameron   Date:   07/07/2019 MRN:  [id number] DOB: 1960-12-31    This 59 y.o. female presents to the clinic today for 1 month follow-up status post concurrent chemoradiation therapy for p16 positive squamous cell carcinoma the base of tongue.  REFERRING PROVIDER: No ref. provider found  HPI: Patient is a 59 year old female now at 1 month having completed concurrent chemoradiation for squamous cell carcinoma the base of tongue p16 positive.  Seen today in routine follow-up she is doing well she specifically denies head and neck pain or dysphagia.  She still has altered taste.  Her weight is stable.  She has not yet seen Dr. Farrel Conners and has had no follow-up imaging at this time..  COMPLICATIONS OF TREATMENT: none  FOLLOW UP COMPLIANCE: keeps appointments   PHYSICAL EXAM:  BP (!) 142/90   Pulse 84   Temp (!) 96.7 F (35.9 C)   Resp 16   Wt 150 lb 6.4 oz (68.2 kg)   SpO2 99%   BMI 26.64 kg/m  Neck is clear without evidence of cervical or supraclavicular adenopathy.  Well-developed well-nourished patient in NAD. HEENT reveals PERLA, EOMI, discs not visualized.  Oral cavity is clear. No oral mucosal lesions are identified. Neck is clear without evidence of cervical or supraclavicular adenopathy. Lungs are clear to A&P. Cardiac examination is essentially unremarkable with regular rate and rhythm without murmur rub or thrill. Abdomen is benign with no organomegaly or masses noted. Motor sensory and DTR levels are equal and symmetric in the upper and lower extremities. Cranial nerves II through XII are grossly intact. Proprioception is intact. No peripheral adenopathy or edema is identified. No motor or sensory levels are noted. Crude visual fields are within normal range.  RADIOLOGY RESULTS: CT scan of soft tissue head and neck ordered  PLAN: Present time patient is doing nice recovering well from her radiation and chemotherapy.  And pleased with her  overall progress.  I have asked to see her on 3 to 4 months and have a CT scan of her head and neck prior to that visit.  I also asked her to contact Dr. Farrel Conners for continuation of overall follow-up.  Patient comprehends my recommendations well.  I would like to take this opportunity to thank you for allowing me to participate in the care of your patient.[name], MD

## 2019-07-14 ENCOUNTER — Other Ambulatory Visit: Payer: Self-pay

## 2019-07-14 ENCOUNTER — Other Ambulatory Visit: Payer: Self-pay | Admitting: Emergency Medicine

## 2019-07-14 ENCOUNTER — Encounter: Payer: Self-pay | Admitting: Oncology

## 2019-07-14 ENCOUNTER — Ambulatory Visit
Admission: RE | Admit: 2019-07-14 | Discharge: 2019-07-14 | Disposition: A | Discharge status: Home / Self Care | Payer: Medicaid Other | Source: Ambulatory Visit | Attending: Oncology | Admitting: Oncology

## 2019-07-14 ENCOUNTER — Telehealth: Payer: Self-pay | Admitting: *Deleted

## 2019-07-14 ENCOUNTER — Other Ambulatory Visit: Payer: Self-pay | Admitting: Oncology

## 2019-07-14 ENCOUNTER — Inpatient Hospital Stay (HOSPITAL_BASED_OUTPATIENT_CLINIC_OR_DEPARTMENT_OTHER): Payer: Medicaid Other | Admitting: Oncology

## 2019-07-14 ENCOUNTER — Other Ambulatory Visit: Payer: Self-pay | Admitting: Hematology and Oncology

## 2019-07-14 ENCOUNTER — Inpatient Hospital Stay: Payer: Medicaid Other | Attending: Oncology

## 2019-07-14 VITALS — BP 149/95 | HR 75 | Temp 98.4°F | Resp 18 | Wt 147.4 lb

## 2019-07-14 DIAGNOSIS — K117 Disturbances of salivary secretion: Secondary | ICD-10-CM | POA: Diagnosis not present

## 2019-07-14 DIAGNOSIS — Z79899 Other long term (current) drug therapy: Secondary | ICD-10-CM | POA: Diagnosis not present

## 2019-07-14 DIAGNOSIS — C760 Malignant neoplasm of head, face and neck: Secondary | ICD-10-CM

## 2019-07-14 DIAGNOSIS — D649 Anemia, unspecified: Secondary | ICD-10-CM | POA: Diagnosis not present

## 2019-07-14 DIAGNOSIS — G4700 Insomnia, unspecified: Secondary | ICD-10-CM | POA: Diagnosis not present

## 2019-07-14 DIAGNOSIS — M79601 Pain in right arm: Secondary | ICD-10-CM | POA: Insufficient documentation

## 2019-07-14 DIAGNOSIS — C01 Malignant neoplasm of base of tongue: Secondary | ICD-10-CM | POA: Diagnosis present

## 2019-07-14 DIAGNOSIS — Z800 Family history of malignant neoplasm of digestive organs: Secondary | ICD-10-CM | POA: Diagnosis not present

## 2019-07-14 DIAGNOSIS — M79605 Pain in left leg: Secondary | ICD-10-CM

## 2019-07-14 DIAGNOSIS — M79602 Pain in left arm: Secondary | ICD-10-CM | POA: Insufficient documentation

## 2019-07-14 DIAGNOSIS — M25519 Pain in unspecified shoulder: Secondary | ICD-10-CM | POA: Diagnosis not present

## 2019-07-14 DIAGNOSIS — Z803 Family history of malignant neoplasm of breast: Secondary | ICD-10-CM | POA: Diagnosis not present

## 2019-07-14 DIAGNOSIS — R7989 Other specified abnormal findings of blood chemistry: Secondary | ICD-10-CM | POA: Diagnosis not present

## 2019-07-14 LAB — CBC WITH DIFFERENTIAL/PLATELET
Abs Immature Granulocytes: 0.01 10*3/uL (ref 0.00–0.07)
Basophils Absolute: 0 10*3/uL (ref 0.0–0.1)
Basophils Relative: 1 %
Eosinophils Absolute: 0.1 10*3/uL (ref 0.0–0.5)
Eosinophils Relative: 2 %
HCT: 30 % — ABNORMAL LOW (ref 36.0–46.0)
Hemoglobin: 10 g/dL — ABNORMAL LOW (ref 12.0–15.0)
Immature Granulocytes: 0 %
Lymphocytes Relative: 27 %
Lymphs Abs: 0.8 10*3/uL (ref 0.7–4.0)
MCH: 32.5 pg (ref 26.0–34.0)
MCHC: 33.3 g/dL (ref 30.0–36.0)
MCV: 97.4 fL (ref 80.0–100.0)
Monocytes Absolute: 0.4 10*3/uL (ref 0.1–1.0)
Monocytes Relative: 14 %
Neutro Abs: 1.7 10*3/uL (ref 1.7–7.7)
Neutrophils Relative %: 56 %
Platelets: 204 10*3/uL (ref 150–400)
RBC: 3.08 MIL/uL — ABNORMAL LOW (ref 3.87–5.11)
RDW: 13.2 % (ref 11.5–15.5)
WBC: 3.1 10*3/uL — ABNORMAL LOW (ref 4.0–10.5)
nRBC: 0 % (ref 0.0–0.2)

## 2019-07-14 LAB — TROPONIN I (HIGH SENSITIVITY): Troponin I (High Sensitivity): 6 ng/L (ref <18)

## 2019-07-14 LAB — COMPREHENSIVE METABOLIC PANEL
ALT: 11 U/L (ref 0–44)
AST: 18 U/L (ref 15–41)
Albumin: 4.3 g/dL (ref 3.5–5.0)
Alkaline Phosphatase: 50 U/L (ref 38–126)
Anion gap: 11 (ref 5–15)
BUN: 9 mg/dL (ref 6–20)
CO2: 25 mmol/L (ref 22–32)
Calcium: 9.4 mg/dL (ref 8.9–10.3)
Chloride: 107 mmol/L (ref 98–111)
Creatinine, Ser: 0.86 mg/dL (ref 0.44–1.00)
GFR calc Af Amer: >60 mL/min (ref >60)
GFR calc non Af Amer: >60 mL/min (ref >60)
Glucose, Bld: 108 mg/dL — ABNORMAL HIGH (ref 70–99)
Potassium: 3.6 mmol/L (ref 3.5–5.1)
Sodium: 143 mmol/L (ref 135–145)
Total Bilirubin: 0.6 mg/dL (ref 0.3–1.2)
Total Protein: 6.7 g/dL (ref 6.5–8.1)

## 2019-07-14 LAB — FIBRIN DERIVATIVES D-DIMER (ARMC ONLY): Fibrin derivatives D-dimer (ARMC): 880.2 ng/mL (FEU) — ABNORMAL HIGH (ref 0.00–499.00)

## 2019-07-14 MED ORDER — GABAPENTIN 300 MG PO CAPS
300.0000 mg | ORAL_CAPSULE | Freq: Once | ORAL | 0 refills | Status: DC
Start: 2019-07-14 — End: 2019-07-15

## 2019-07-14 NOTE — Progress Notes (Signed)
Pt comes in reporting that she's having increasingly worse left arm and leg pain for the last 3 days. Reports that the pain in the arm is from her neck down to past her elbow and that the pain in her leg is from her knee area down. Describes the pain as stabbing. Denies any known injury. Nothing makes the pain better or worse. Also reports that last night her left foot was colder than her right.

## 2019-07-14 NOTE — Progress Notes (Addendum)
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  Chief complaint/ Reason for visit- Left arm and left leg pain  Heme/Onc history:  Oncology History  Carcinoma of base of tongue ([place])  02/04/2019 Initial Diagnosis   Carcinoma of base of tongue ([place])   03/30/2019 -  Chemotherapy   The patient had palonosetron (ALOXI) injection 0.25 mg, 0.25 mg, Intravenous,  Once, 3 of 3 cycles Administration: 0.25 mg (03/30/2019), 0.25 mg (04/20/2019), 0.25 mg (05/11/2019), 0.25 mg (05/18/2019) CISplatin (PLATINOL) 192 mg in sodium chloride 0.9 % 500 mL chemo infusion, 100 mg/m2 = 176 mg, Intravenous,  Once, 3 of 3 cycles Dose modification: 30 mg/m2 (original dose 100 mg/m2, Cycle 3, Reason: Other (see comments), Comment: switching to weekly dosing) Administration: 192 mg (03/30/2019), 192 mg (04/20/2019), 58 mg (05/11/2019), 58 mg (05/18/2019) fosaprepitant (EMEND) 150 mg in sodium chloride 0.9 % 145 mL IVPB, 150 mg, Intravenous,  Once, 3 of 3 cycles Administration: 150 mg (03/30/2019), 150 mg (04/20/2019), 150 mg (05/11/2019), 150 mg (05/18/2019)  for chemotherapy treatment.     Interval history-Connie Cameron is a 59 year old female with past medical history significant for throat cancer being treated by Dr. Mike Gip.  She received 3 cycles of cisplatin along with radiation which was completed at the end of April 2021.  She was admitted to [facility] from 04/02/19-04/06/2019 with cellulitis involving the trapezius and right supraclavicular area.  Port-A-Cath was uninvolved.  She received cefepime and vancomycin.  She was  discharged on doxycycline.  She was admitted to [facility] from 05/02/2019-05/05/2019 with fever of 100.5 and radiation mucositis.  Patient was able to eat and drink fluids secondary to sore throat.  Radiation was held.  She started on Decadron 4 mg twice daily.  Since completion of treatment she has done well.  Admits to feeling somewhat back to normal.  Approximately 3 to 4 days ago she developed left arm and leg pain.  She denies any injury or recent fall to the left side.  She denies any side effects from chemotherapy or radiation.  She has tried elevation of her feet, icy hot to her left shoulder and a heating pad.  States she cannot get comfortable at bedtime due to pain if she lies on her left side.  It is causing insomnia.  She denies any recent fever or illness, easy bleeding or bruising.  Reports a good appetite and denies weight loss.  She denies chest pain, nausea, vomiting, diarrhea or constipation.  She denies any urinary concerns.  ECOG FS:1 - Symptomatic but completely ambulatory  Review of systems- Review of Systems  Constitutional: Negative.  Negative for chills, fever, malaise/fatigue and weight loss.  HENT: Negative for congestion, ear pain and tinnitus.   Eyes: Negative.  Negative for blurred vision and double vision.  Respiratory: Negative.  Negative for cough, sputum production and shortness of breath.   Cardiovascular: Positive for leg swelling (left). Negative for chest pain and palpitations.  Gastrointestinal: Negative.  Negative for abdominal pain, constipation, diarrhea, nausea and vomiting.  Genitourinary: Negative for dysuria, frequency, hematuria and urgency.  Musculoskeletal: Negative for back pain and falls.  Left arm and left leg pain  Skin: Negative.  Negative for rash.  Neurological: Negative.  Negative for weakness and headaches.  Endo/Heme/Allergies: Negative.  Does not bruise/bleed easily.  Psychiatric/Behavioral: Negative.  Negative for depression. The  patient is not nervous/anxious and does not have insomnia.     Current treatment- Observation  No Known Allergies   Past Medical History:  Diagnosis Date  . Throat cancer ([place]) 2021     Past Surgical History:  Procedure Laterality Date  . EXCISION MASS NECK Left 01/18/2019   Procedure: EXCISION MASS NECK/NODE;  Surgeon: Margaretha Sheffield, MD;  Location: [facility] ORS;  Service: ENT;  Laterality: Left;  . LARYNGOSCOPY Bilateral 02/22/2019   Procedure: MICROSCOPIC DIRECT LARYNGOSCOPY AND BIOPSY;  Surgeon: Margaretha Sheffield, MD;  Location: [facility] ORS;  Service: ENT;  Laterality: Bilateral;  . PORTA CATH INSERTION N/A 03/24/2019   Procedure: PORTA CATH INSERTION;  Surgeon: Katha Cabal, MD;  Location: [facility] [place];  Service: Cardiovascular;  Laterality: N/A;  . TONSILLECTOMY    . TUBAL LIGATION      Social History   Socioeconomic History  . Marital status: Single    Spouse name: Not on file  . Number of children: Not on file  . Years of education: Not on file  . Highest education level: Not on file  Occupational History  . Not on file  Tobacco Use  . Smoking status: Never Smoker  . Smokeless tobacco: Never Used  Vaping Use  . Vaping Use: Never used  Substance and Sexual Activity  . Alcohol use: Not Currently  . Drug use: No  . Sexual activity: Not on file  Other Topics Concern  . Not on file  Social History Narrative  . Not on file   Social Determinants of Health   Financial Resource Strain:   . Difficulty of Paying Living Expenses:   Food Insecurity:   . Worried About Charity fundraiser in the Last Year:   . Arboriculturist in the Last Year:   Transportation Needs:   . Film/video editor (Medical):   Marland Kitchen Lack of Transportation (Non-Medical):   Physical Activity:   . Days of Exercise per Week:   . Minutes of Exercise per Session:   Stress:   . Feeling of Stress :   Social Connections:   . Frequency of Communication with Friends and Family:   . Frequency  of Social Gatherings with Friends and Family:   . Attends Religious Services:   . Active Member of Clubs or Organizations:   . Attends Archivist Meetings:   Marland Kitchen Marital Status:   Intimate Partner Violence:   . Fear of Current or Ex-Partner:   . Emotionally Abused:   Marland Kitchen Physically Abused:   . Sexually Abused:     Family History  Problem Relation Age of Onset  . Breast cancer Sister   . Brain cancer Paternal Grandfather      Current Outpatient Medications:  .  famotidine (PEPCID) 20 MG tablet, Take 1 tablet (20 mg total) by mouth daily., Disp: 30 tablet, Rfl: 0 .  lidocaine-prilocaine (EMLA) cream, Apply to affected area once, Disp: 30 g, Rfl: 3 .  LORazepam (ATIVAN) 0.5 MG tablet, Take 1 tablet (0.5 mg total) by mouth every 6 (six) hours as needed (Nausea or vomiting)., Disp: 30 tablet, Rfl: 0 .  Morphine Sulfate (MORPHINE CONCENTRATE) 10 mg / 0.5 ml concentrated solution, Take 0.5 mLs (10 mg total) by mouth every 4 (four)  hours as needed for severe pain., Disp: 30 mL, Rfl: 0 .  ondansetron (ZOFRAN) 8 MG tablet, Take 1 tablet (8 mg total) by mouth 2 (two) times daily as needed. Start on the third day after chemotherapy., Disp: 30 tablet, Rfl: 1 .  sucralfate (CARAFATE) 1 g tablet, Take 1 tablet (1 g total) by mouth 3 (three) times daily before meals. Dissolve tablet in warm water, swish and swallow., Disp: 90 tablet, Rfl: 6 .  traMADol (ULTRAM) 50 MG tablet, Take 1-2 tablets (50-100 mg total) by mouth every 6 (six) hours as needed., Disp: 45 tablet, Rfl: 0  Physical exam:  Vitals:   07/14/19 1437  BP: (!) 149/95  Pulse: 75  Resp: 18  Temp: 98.4 F (36.9 C)  TempSrc: Oral  SpO2: 100%  Weight: 147 lb 6.4 oz (66.9 kg)   Physical Exam Constitutional:      Appearance: Normal appearance.  HENT:     Head: Normocephalic and atraumatic.  Eyes:     Pupils: Pupils are equal, round, and reactive to light.  Cardiovascular:     Rate and Rhythm: Normal rate and regular rhythm.      Heart sounds: Normal heart sounds. No murmur.  Pulmonary:     Effort: Pulmonary effort is normal.     Breath sounds: Normal breath sounds. No wheezing.  Abdominal:     General: Bowel sounds are normal. There is no distension.     Palpations: Abdomen is soft.     Tenderness: There is no abdominal tenderness.  Musculoskeletal:        General: Tenderness (Left shoulder) present. Normal range of motion.     Cervical back: Normal range of motion.     Left lower leg: Edema present.  Skin:    General: Skin is warm and dry.     Findings: No rash.  Neurological:     Mental Status: She is alert and oriented to person, place, and time.  Psychiatric:        Judgment: Judgment normal.      CMP Latest Ref Rng & Units 07/14/2019  Glucose 70 - 99 mg/dL 108(H)  BUN 6 - 20 mg/dL 9  Creatinine 0.44 - 1.00 mg/dL 0.86  Sodium 135 - 145 mmol/L 143  Potassium 3.5 - 5.1 mmol/L 3.6  Chloride 98 - 111 mmol/L 107  CO2 22 - 32 mmol/L 25  Calcium 8.9 - 10.3 mg/dL 9.4  Total Protein 6.5 - 8.1 g/dL 6.7  Total Bilirubin 0.3 - 1.2 mg/dL 0.6  Alkaline Phos 38 - 126 U/L 50  AST 15 - 41 U/L 18  ALT 0 - 44 U/L 11   CBC Latest Ref Rng & Units 07/14/2019  WBC 4.0 - 10.5 K/uL 3.1(L)  Hemoglobin 12.0 - 15.0 g/dL 10.0(L)  Hematocrit 36 - 46 % 30.0(L)  Platelets 150 - 400 K/uL 204    No images are attached to the encounter.  US Venous Img Lower Unilateral Left (DVT)  Result Date: 07/14/2019 CLINICAL DATA:  Left lower extremity pain and edema. EXAM: LEFT LOWER EXTREMITY VENOUS DOPPLER ULTRASOUND TECHNIQUE: Gray-scale sonography with graded compression, as well as color Doppler and duplex ultrasound were performed to evaluate the lower extremity deep venous systems from the level of the common femoral vein and including the common femoral, femoral, profunda femoral, popliteal and calf veins including the posterior tibial, peroneal and gastrocnemius veins when visible. The superficial great saphenous vein was also  interrogated. Spectral Doppler was utilized to evaluate flow at rest  and with distal augmentation maneuvers in the common femoral, femoral and popliteal veins. COMPARISON:  None. FINDINGS: Contralateral Common Femoral Vein: Respiratory phasicity is normal and symmetric with the symptomatic side. No evidence of thrombus. Normal compressibility. Common Femoral Vein: No evidence of thrombus. Normal compressibility, respiratory phasicity and response to augmentation. Saphenofemoral Junction: No evidence of thrombus. Normal compressibility and flow on color Doppler imaging. Profunda Femoral Vein: No evidence of thrombus. Normal compressibility and flow on color Doppler imaging. Femoral Vein: No evidence of thrombus. Normal compressibility, respiratory phasicity and response to augmentation. Popliteal Vein: No evidence of thrombus. Normal compressibility, respiratory phasicity and response to augmentation. Calf Veins: No evidence of thrombus. Normal compressibility and flow on color Doppler imaging. Superficial Great Saphenous Vein: No evidence of thrombus. Normal compressibility. Venous Reflux:  None. Other Findings: No evidence of superficial thrombophlebitis or abnormal fluid collection. IMPRESSION: No evidence of left lower extremity deep venous thrombosis. Electronically Signed   By: Aletta Edouard M.D.   On: 07/14/2019 16:48    Assessment and plan- Patient is a 59 y.o. female who presents to symptom management for concerns of left shoulder pain and left leg swelling and discomfort.  Given history of cancer and recent treatment with radiation and chemotherapy, will rule out cardiac concerns and blood clot in left lower extremity.  EKG reads normal sinus.  D-dimer slightly elevated but also can be falsely elevated and nonspecific given malignancy.  Troponin was negative.  She denies chest pain or shortness of breath.  Vital signs are stable.  She is not hypoxic.  She is not tachycardic.  Will get imaging of the  left shoulder to rule out acute abnormality.  May need MRI of shoulder along with an orthopedic referral.  Other lab work unremarkable.  Plan: Stat ultrasound LLE- Negative Stat CTA Xray of left shoulder Labs (D-dimer, cbc, troponin) Possible referral to orthopedics.  Rx tramadol 50 to 100 mg every 6 hours as needed for shoulder pain.  Disposition: Patient sent for ultrasound of left lower extremity and left shoulder x-ray.  Will call patient with results. Unclear etiology of left lower extremity swelling.  Recommend TED hose and elevation for now.  If persistent would recommend referral to vascular.   Visit Diagnosis 1. Left arm pain   2. Left leg pain     Patient expressed understanding and was in agreement with this plan. She also understands that She can call clinic at any time with any questions, concerns, or complaints.   Greater than 50% was spent in counseling and coordination of care with this patient including but not limited to discussion of the relevant topics above (See A&P) including, but not limited to diagnosis and management of acute and chronic medical conditions.   Thank you for allowing me to participate in the care of this very pleasant patient.    Jacquelin Hawking, NP [place] [facility] Cell - [phone] Pager- [phone] 07/15/2019 3:04 PM

## 2019-07-14 NOTE — Telephone Encounter (Signed)
Patient called reporting that she is having pain 9/10 in her left arm and left leg, states it is more than an ache and is making sleep impossible. She completed radiation therapy 4 weeks ago. She does report that she had low grade fever 99.8 Sunday and took Tylenol for ir, but has no shortness of breath or cough. She is asking that something be done for her. Please advise

## 2019-07-14 NOTE — Telephone Encounter (Signed)
Called patient to get some more details. Pt reports that she's been having the pain for about 3 days, and it's steadily been getting worse. States that it's a stabbing pain, denies any swelling to arm or leg. No known injury.

## 2019-07-14 NOTE — Telephone Encounter (Signed)
Yes sounds good- Labs and EKG for starters and possible doppler. Thanks

## 2019-07-14 NOTE — Telephone Encounter (Signed)
Called and spoke with patient. Patient accepted appointment for labs at 2, and [facility] at 2:15.

## 2019-07-14 NOTE — Telephone Encounter (Signed)
Connie Baller, do you want her to come in for [facility]?

## 2019-07-14 NOTE — Telephone Encounter (Signed)
  Etiology unclear especially involving left arm and leg.   Patient needs to be seen (? symptom management). Consider cardiac issues/MI or PE for left chest and upper arm pain. Unclear if left leg pain could be a DVT.  She will need imaging and labs (including D-dimer and troponins).  M

## 2019-07-15 ENCOUNTER — Other Ambulatory Visit: Payer: Self-pay | Admitting: Oncology

## 2019-07-15 DIAGNOSIS — R7989 Other specified abnormal findings of blood chemistry: Secondary | ICD-10-CM

## 2019-07-15 MED ORDER — TRAMADOL HCL 50 MG PO TABS: 50.0000 mg | ORAL_TABLET | Freq: Four times a day (QID) | ORAL | 0 refills | Status: DC | PRN

## 2019-07-16 ENCOUNTER — Telehealth: Payer: Self-pay | Admitting: Oncology

## 2019-07-16 ENCOUNTER — Ambulatory Visit: Admission: RE | Admit: 2019-07-16 | Payer: Self-pay | Source: Ambulatory Visit

## 2019-07-16 NOTE — Telephone Encounter (Signed)
Re: Appointments  Connie Cameron was unable to have her CT scan to rule out PE due to transportation concerns.  It was arranged for her to have it completed on 07/16/2019 at 2 PM.  Called and left a voicemail for patient reminding her of this appointment as well as giving her directions.  We will touch base with results.  Faythe Casa, NP 07/16/2019 1:13 PM

## 2019-07-26 NOTE — Progress Notes (Signed)
[facility]  [address] [place], [place] [address] Phone: [phone]  Fax: [phone]   Clinic Day:  07/28/2019  Referring physician: No ref. provider found  Chief Complaint: [name] is a 59 y.o. 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   HPI: The patient was last seen in the medical oncology clinic on 06/30/2019.  At that time, she was doing "ok". Food did not taste good. Mouth was dry. Oral lesions had resolved. Weight had improved. Hematocrit was 25.9, hemoglobin 8.5, MCV 98.5, platelets 187,000, WBC 2,800 (ANC 1,300). Magnesium was 2.2.  The patient saw Dr. Baruch Gouty on 07/07/2019 for a 1 month follow up since completing chemoradiation. She reported altered taste; she denied head and neck pain or dysphagia. She is scheduled for neck CT on 11/03/2019 and follow-up on 11/10/2019.  The patient saw Faythe Casa, NP, on 07/14/2019 for worsening left arm and left leg pain x 3 days. Hematocrit was 30.0, hemoglobin 10.0, MCV 97.4, platelets 204,000, WBC 3,100 (ANC 1,700). Troponin was 6. D-Dimer was 880.20.  LLE ultrasound, CTA, and left shoulder x ray were ordered. She was prescribed Tramadol 50 to 100 mg every 6 hours prn.  Left lower extremity duplex on 07/14/2019 revealed no evidence of DVT.  Chest CT angiogram on 07/27/2019 revealed no definite evidence of pulmonary embolus.  There was a stable 5 mm linear density in the anterior portion of the left lower lobe. Follow-up CT scan in 12 months was recommended to ensure stability.  During the interim, she has been okay. Her mouth is still dry and her taste is "off." Her neck has improved. She no longer has to force herself to eat and is drinking Boost/Ensure. She denies shortness of breath and cough.   Past Medical History:  Diagnosis Date  . Throat cancer ([place]) 2021    Past Surgical  History:  Procedure Laterality Date  . EXCISION MASS NECK Left 01/18/2019   Procedure: EXCISION MASS NECK/NODE;  Surgeon: Margaretha Sheffield, MD;  Location: [facility] ORS;  Service: ENT;  Laterality: Left;  . LARYNGOSCOPY Bilateral 02/22/2019   Procedure: MICROSCOPIC DIRECT LARYNGOSCOPY AND BIOPSY;  Surgeon: Margaretha Sheffield, MD;  Location: [facility] ORS;  Service: ENT;  Laterality: Bilateral;  . PORTA CATH INSERTION N/A 03/24/2019   Procedure: PORTA CATH INSERTION;  Surgeon: Katha Cabal, MD;  Location: [facility];  Service: Cardiovascular;  Laterality: N/A;  . TONSILLECTOMY    . TUBAL LIGATION      Family History  Problem Relation Age of Onset  . Breast cancer Sister   . Brain cancer Paternal Grandfather     Social History:  reports that she has never smoked. She has never used smokeless tobacco. She reports previous alcohol use. She reports that she does not use drugs. She use to drink years ago. She denies any exposure to radiation or toxins. 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 She lives in [place]. The patient is alone today.  Allergies: No Known Allergies  Current Medications: Current Outpatient Medications  Medication Sig Dispense Refill  . famotidine (PEPCID) 20 MG tablet Take 1 tablet (20 mg total) by mouth daily. 30 tablet 0  . lidocaine-prilocaine (EMLA) cream Apply to affected area once 30 g 3  . Morphine Sulfate (MORPHINE CONCENTRATE) 10 mg / 0.5 ml concentrated solution Take 0.5 mLs (10  mg total) by mouth every 4 (four) hours as needed for severe pain. 30 mL 0  . sucralfate (CARAFATE) 1 g tablet Take 1 tablet (1 g total) by mouth 3 (three) times daily before meals. Dissolve tablet in warm water, swish and swallow. 90 tablet 6  . traMADol (ULTRAM) 50 MG tablet Take 1-2 tablets (50-100 mg total) by mouth every 6 (six) hours as needed. 45 tablet 0  . LORazepam (ATIVAN) 0.5 MG tablet Take 1 tablet (0.5 mg total) by mouth every 6 (six) hours  as needed (Nausea or vomiting). (Patient not taking: Reported on 07/28/2019) 30 tablet 0  . ondansetron (ZOFRAN) 8 MG tablet Take 1 tablet (8 mg total) by mouth 2 (two) times daily as needed. Start on the third day after chemotherapy. (Patient not taking: Reported on 07/28/2019) 30 tablet 1   No current facility-administered medications for this visit.    Review of Systems  Constitutional: Positive for weight loss (10 lbs). Negative for chills, diaphoresis, fever and malaise/fatigue.       Doing 'ok'.  HENT: Positive for hearing loss (left ear). Negative for congestion, ear discharge, ear pain, nosebleeds, sinus pain, sore throat and tinnitus.        Hears "chirping."  Eyes: Negative.  Negative for blurred vision, double vision and photophobia.  Respiratory: Negative.  Negative for cough, hemoptysis, sputum production and shortness of breath.   Cardiovascular: Negative.  Negative for chest pain, palpitations and leg swelling.  Gastrointestinal: Negative for abdominal pain, blood in stool, constipation, diarrhea, heartburn, melena, nausea and vomiting.       Her taste is "off" s/p radiation. She is drinking Boost/Ensure.  Genitourinary: Negative for dysuria, frequency and urgency.  Musculoskeletal: Negative.  Negative for back pain, falls, joint pain and myalgias.  Skin: Negative for itching and rash.  Neurological: Negative.  Negative for dizziness, tingling, sensory change, speech change, focal weakness, weakness and headaches.  Endo/Heme/Allergies: Negative.  Negative for environmental allergies. Does not bruise/bleed easily.  Psychiatric/Behavioral: Negative.  Negative for depression and memory loss. The patient is not nervous/anxious and does not have insomnia.   All other systems reviewed and are negative.  Performance status (ECOG): 1  Vitals Blood pressure 134/86, pulse 77, temperature 98.1 F (36.7 C), temperature source Tympanic, resp. rate 18, height 5\' 3"  (1.6 m), weight 137 lb  3.8 oz (62.2 kg), SpO2 100 %.   Physical Exam Vitals and nursing note reviewed.  Constitutional:      General: She is not in acute distress.    Appearance: She is well-developed. She is not diaphoretic.     Interventions: Face mask in place.  HENT:     Head: Normocephalic and atraumatic.     Right Ear: Ear canal and external ear normal. No middle ear effusion. Tympanic membrane is not erythematous, retracted or bulging.     Left Ear: Ear canal and external ear normal.  No middle ear effusion. Tympanic membrane is not erythematous, retracted or bulging.     Mouth/Throat:     Mouth: No oral lesions.     Pharynx: Oropharynx is clear. No oropharyngeal exudate.  Eyes:     General: No scleral icterus.    Extraocular Movements: Extraocular movements intact.     Conjunctiva/sclera: Conjunctivae normal.     Pupils: Pupils are equal, round, and reactive to light.     Comments: Blue eyes.  Neck:     Vascular: No JVD.  Cardiovascular:     Rate and Rhythm: Normal rate and  regular rhythm.     Pulses: Normal pulses.     Heart sounds: Normal heart sounds. No murmur heard.  No friction rub. No gallop.   Pulmonary:     Effort: Pulmonary effort is normal. No respiratory distress.     Breath sounds: Normal breath sounds. No wheezing, rhonchi or rales.  Chest:     Chest wall: No tenderness.  Abdominal:     General: Bowel sounds are normal. There is no distension.     Palpations: Abdomen is soft. There is no mass.     Tenderness: There is no abdominal tenderness. There is no guarding or rebound.  Musculoskeletal:        General: No swelling or tenderness. Normal range of motion.     Cervical back: Normal range of motion and neck supple.  Lymphadenopathy:     Head:     Right side of head: No preauricular, posterior auricular or occipital adenopathy.     Left side of head: No preauricular, posterior auricular or occipital adenopathy.     Cervical: No cervical adenopathy.     Upper Body:      Right upper body: No supraclavicular or axillary adenopathy.     Left upper body: No supraclavicular or axillary adenopathy.     Lower Body: No right inguinal adenopathy. No left inguinal adenopathy.  Skin:    General: Skin is warm and dry.     Coloration: Skin is not pale.     Findings: No bruising, erythema, lesion or rash.  Neurological:     Mental Status: She is alert and oriented to person, place, and time.  Psychiatric:        Behavior: Behavior normal.        Thought Content: Thought content normal.        Judgment: Judgment normal.    Infusion on 07/28/2019  Component Date Value Ref Range Status  . Sodium 07/28/2019 139  135 - 145 mmol/L Final  . Potassium 07/28/2019 3.5  3.5 - 5.1 mmol/L Final  . Chloride 07/28/2019 104  98 - 111 mmol/L Final  . CO2 07/28/2019 26  22 - 32 mmol/L Final  . Glucose, Bld 07/28/2019 106* 70 - 99 mg/dL Final   Glucose reference range applies only to samples taken after fasting for at least 8 hours.  . BUN 07/28/2019 8  6 - 20 mg/dL Final  . Creatinine, Ser 07/28/2019 0.70  0.44 - 1.00 mg/dL Final  . Calcium 07/28/2019 9.5  8.9 - 10.3 mg/dL Final  . Total Protein 07/28/2019 6.7  6.5 - 8.1 g/dL Final  . Albumin 07/28/2019 4.1  3.5 - 5.0 g/dL Final  . AST 07/28/2019 18  15 - 41 U/L Final  . ALT 07/28/2019 12  0 - 44 U/L Final  . Alkaline Phosphatase 07/28/2019 39  38 - 126 U/L Final  . Total Bilirubin 07/28/2019 0.8  0.3 - 1.2 mg/dL Final  . GFR calc non Af Amer 07/28/2019 >60  >60 mL/min Final  . GFR calc Af Amer 07/28/2019 >60  >60 mL/min Final  . Anion gap 07/28/2019 9  5 - 15 Final   Performed at [facility], [address] [place], [place] [address]  . WBC 07/28/2019 3.5* 4.0 - 10.5 K/uL Final  . RBC 07/28/2019 3.34* 3.87 - 5.11 MIL/uL Final  . Hemoglobin 07/28/2019 10.5* 12.0 - 15.0 g/dL Final  . HCT 07/28/2019 31.2* 36 - 46 % Final  . MCV 07/28/2019 93.4  80.0 -  100.0 fL Final  . MCH 07/28/2019 31.4  26.0 - 34.0 pg Final   . MCHC 07/28/2019 33.7  30.0 - 36.0 g/dL Final  . RDW 07/28/2019 11.9  11.5 - 15.5 % Final  . Platelets 07/28/2019 190  150 - 400 K/uL Final  . nRBC 07/28/2019 0.0  0.0 - 0.2 % Final  . Neutrophils Relative % 07/28/2019 67  % Final  . Neutro Abs 07/28/2019 2.4  1.7 - 7.7 K/uL Final  . Lymphocytes Relative 07/28/2019 18  % Final  . Lymphs Abs 07/28/2019 0.6* 0.7 - 4.0 K/uL Final  . Monocytes Relative 07/28/2019 11  % Final  . Monocytes Absolute 07/28/2019 0.4  0 - 1 K/uL Final  . Eosinophils Relative 07/28/2019 3  % Final  . Eosinophils Absolute 07/28/2019 0.1  0 - 0 K/uL Final  . Basophils Relative 07/28/2019 1  % Final  . Basophils Absolute 07/28/2019 0.0  0 - 0 K/uL Final  . Immature Granulocytes 07/28/2019 0  % Final  . Abs Immature Granulocytes 07/28/2019 0.01  0.00 - 0.07 K/uL Final   Performed at [facility], [address], [place], [place] [address]    Assessment:  MISTI TOWLE is a 59 y.o. 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 Pathologyrevealed poorly differentiated squamous cell carcinoma. Malignant cells are positive for p16, p40, and pancytokeratin.   She presented with left cervical adenopathy. 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 Node was 4.0 x 2.3 x 1.8 cm.  Neck CTon 12/21/2018 revealed a 2.4 x 2.1 x 3.4 cm lymph node in the left upper neck. The lymph node showed heterogeneous enhancement and stranding in the surrounding fat. There was a 6 mm left upper posterior lymph node and a 7 mm right level 3 lymph node. Imaging was concerning for malignancy. There were no other enlarged lymph nodes. There was no pharyngeal mass.   PET scan on 02/04/2019 revealed prior left level II cervical node had been excised. There were no findings on PET to suggest a  site of primary head/neck cancer. There was symmetric hypermetabolism along the base of the tongue bilaterally favored to be physiologic. There was no evidence of metastatic disease.   Audiogramon 03/11/2019 revealed normal hearing in the right earthrough 8 kHz with mild hearing loss at 12 kHz only. Hearing was within normal limits in the left earthrough 3 kHz with mild-severe hearing loss from 4-12 kHz. Audiogram on 04/30/2019 revealed significant decrease in high-frequency hearing. 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  She received radiation from 03/30/2019 - 05/25/2019. She is s/p 2 cycles of cisplatin100 mg/m2 (03/30/2019 - 04/20/2019).  She received weekly cisplatin 30 mg/m2 x2 (05/11/2019 and 05/18/2019).  She received filgrastim-sndz support on 05/12/2019 and 05/13/2019.   Left lower extremity duplex on 07/14/2019 revealed no evidence of DVT.  Chest CT angiogram on 07/27/2019 revealed no definite evidence of pulmonary embolus.  There was a stable 5 mm linear density in the anterior portion of the left lower lobe. Follow-up CT scan in 12 months was recommended to ensure stability.  She was admitted to [facility] 04/02/2019 -04/06/2019 with cellulitis involving the trapezius and right supraclavicular area. Port-a-cath was uninvolved. She received cefepime and vancomycin. She was discharged on doxycycline.  She was admitted to [facility] from 05/02/2019 to 05/05/2019 with fever of 100.5  and radiation mucositis. Patient was unable to eat or drink fluids secondary to sore throat.  Radiation was held. She was started on Decadron 4 mg BID.   Symptomatically, she is doing well.  She has shoulder pain.  Her mouth is dry.  She has lost 10 pounds.  Exam is stable.  Plan: 1.   Labs today: CBC with diff, CMP, copper, retic. 2.Stage I left base of tongue carcinoma  Clinically, he appears to be doing well She completed concurrent  cisplatin and radiation .             Radiation completed on 05/25/2019.             Follow-up restaging CT.  She has ongoing xerostomia and change in taste affecting eating. 3.   High frequency hearing loss and tinnitus             High-frequency hearing loss and tinnitus has improved.  Continue to monitor. 4.   Normocytic anemia  Hematocrit 25.9.  Hemoglobin 8.5.    MCV 98.5 on 06/30/2019.  Hematocrit 31.2.  Hemoglobin 10.5.  MCV 93.4 on 07/28/2019.  Counts are improving following completion of chemotherapy.  Continue to monitor.  5.   Shoulder pain  Etiology is unclear.  She denies any trauma.  Shoulder films today. 6.  Xerostomia  Etiology secondary to radiation.  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 Contact Nuala Alpha. 7.   RTC on 02/08/2019 for MD assessment, labs (CBC with diff, CMP, Mg), and review of CT scan (already ordered).  I discussed the assessment and treatment plan with the patient. The patient was provided an opportunity to ask questions and all were answered. The patient agreed with the plan and demonstrated an understanding of the instructions. The patient was advised to call back if the symptoms worsen or if the condition fails to improve as anticipated.   Lequita Asal, MD, PhD    07/28/2019, 11:38 AM  I, De Burrs, am acting as a Education administrator for Lequita Asal, MD.  I, [place] Mike Gip, MD, have reviewed the above documentation for accuracy and completeness, and I agree with the above.

## 2019-07-27 ENCOUNTER — Other Ambulatory Visit: Payer: Self-pay

## 2019-07-27 ENCOUNTER — Ambulatory Visit
Admission: RE | Admit: 2019-07-27 | Discharge: 2019-07-27 | Disposition: A | Discharge status: Home / Self Care | Payer: Self-pay | Source: Ambulatory Visit | Attending: Oncology | Admitting: Oncology

## 2019-07-27 DIAGNOSIS — R7989 Other specified abnormal findings of blood chemistry: Secondary | ICD-10-CM | POA: Insufficient documentation

## 2019-07-27 MED ORDER — IOHEXOL 350 MG/ML SOLN
75.0000 mL | Freq: Once | INTRAVENOUS | Status: AC | PRN
  Administered 2019-07-27: 75 mL via INTRAVENOUS

## 2019-07-28 ENCOUNTER — Inpatient Hospital Stay: Payer: Medicaid Other

## 2019-07-28 ENCOUNTER — Ambulatory Visit
Admission: RE | Admit: 2019-07-28 | Discharge: 2019-07-28 | Disposition: A | Discharge status: Home / Self Care | Payer: Medicaid Other | Source: Ambulatory Visit | Attending: Oncology | Admitting: Oncology

## 2019-07-28 ENCOUNTER — Encounter: Payer: Self-pay | Admitting: Hematology and Oncology

## 2019-07-28 ENCOUNTER — Ambulatory Visit
Admission: RE | Admit: 2019-07-28 | Discharge: 2019-07-28 | Disposition: A | Discharge status: Home / Self Care | Payer: Medicaid Other | Attending: Oncology | Admitting: Oncology

## 2019-07-28 ENCOUNTER — Inpatient Hospital Stay (HOSPITAL_BASED_OUTPATIENT_CLINIC_OR_DEPARTMENT_OTHER): Payer: Medicaid Other | Admitting: Hematology and Oncology

## 2019-07-28 VITALS — BP 134/86 | HR 77 | Temp 98.1°F | Resp 18 | Ht 63.0 in | Wt 137.2 lb

## 2019-07-28 DIAGNOSIS — M79602 Pain in left arm: Secondary | ICD-10-CM | POA: Diagnosis not present

## 2019-07-28 DIAGNOSIS — H9313 Tinnitus, bilateral: Secondary | ICD-10-CM

## 2019-07-28 DIAGNOSIS — D649 Anemia, unspecified: Secondary | ICD-10-CM

## 2019-07-28 DIAGNOSIS — C01 Malignant neoplasm of base of tongue: Secondary | ICD-10-CM

## 2019-07-28 DIAGNOSIS — M25519 Pain in unspecified shoulder: Secondary | ICD-10-CM

## 2019-07-28 DIAGNOSIS — D72819 Decreased white blood cell count, unspecified: Secondary | ICD-10-CM

## 2019-07-28 LAB — CBC WITH DIFFERENTIAL/PLATELET
Abs Immature Granulocytes: 0.01 10*3/uL (ref 0.00–0.07)
Basophils Absolute: 0 10*3/uL (ref 0.0–0.1)
Basophils Relative: 1 %
Eosinophils Absolute: 0.1 10*3/uL (ref 0.0–0.5)
Eosinophils Relative: 3 %
HCT: 31.2 % — ABNORMAL LOW (ref 36.0–46.0)
Hemoglobin: 10.5 g/dL — ABNORMAL LOW (ref 12.0–15.0)
Immature Granulocytes: 0 %
Lymphocytes Relative: 18 %
Lymphs Abs: 0.6 10*3/uL — ABNORMAL LOW (ref 0.7–4.0)
MCH: 31.4 pg (ref 26.0–34.0)
MCHC: 33.7 g/dL (ref 30.0–36.0)
MCV: 93.4 fL (ref 80.0–100.0)
Monocytes Absolute: 0.4 10*3/uL (ref 0.1–1.0)
Monocytes Relative: 11 %
Neutro Abs: 2.4 10*3/uL (ref 1.7–7.7)
Neutrophils Relative %: 67 %
Platelets: 190 10*3/uL (ref 150–400)
RBC: 3.34 MIL/uL — ABNORMAL LOW (ref 3.87–5.11)
RDW: 11.9 % (ref 11.5–15.5)
WBC: 3.5 10*3/uL — ABNORMAL LOW (ref 4.0–10.5)
nRBC: 0 % (ref 0.0–0.2)

## 2019-07-28 LAB — COMPREHENSIVE METABOLIC PANEL
ALT: 12 U/L (ref 0–44)
AST: 18 U/L (ref 15–41)
Albumin: 4.1 g/dL (ref 3.5–5.0)
Alkaline Phosphatase: 39 U/L (ref 38–126)
Anion gap: 9 (ref 5–15)
BUN: 8 mg/dL (ref 6–20)
CO2: 26 mmol/L (ref 22–32)
Calcium: 9.5 mg/dL (ref 8.9–10.3)
Chloride: 104 mmol/L (ref 98–111)
Creatinine, Ser: 0.7 mg/dL (ref 0.44–1.00)
GFR calc Af Amer: >60 mL/min (ref >60)
GFR calc non Af Amer: >60 mL/min (ref >60)
Glucose, Bld: 106 mg/dL — ABNORMAL HIGH (ref 70–99)
Potassium: 3.5 mmol/L (ref 3.5–5.1)
Sodium: 139 mmol/L (ref 135–145)
Total Bilirubin: 0.8 mg/dL (ref 0.3–1.2)
Total Protein: 6.7 g/dL (ref 6.5–8.1)

## 2019-07-28 LAB — RETICULOCYTES
Immature Retic Fract: 5.2 % (ref 2.3–15.9)
RBC.: 3.33 MIL/uL — ABNORMAL LOW (ref 3.87–5.11)
Retic Count, Absolute: 29 10*3/uL (ref 19.0–186.0)
Retic Ct Pct: 0.9 % (ref 0.4–3.1)

## 2019-07-28 MED ORDER — HEPARIN SOD (PORK) LOCK FLUSH 100 UNIT/ML IV SOLN
500.0000 [IU] | Freq: Once | INTRAVENOUS | Status: AC
  Administered 2019-07-28: 500 [IU] via INTRAVENOUS
  Filled 2019-07-28: qty 5

## 2019-07-28 NOTE — Progress Notes (Signed)
The patient c/o right arm pain ( 4) today

## 2019-07-30 LAB — COPPER, SERUM: Copper: 88 ug/dL (ref 80–158)

## 2019-08-05 ENCOUNTER — Inpatient Hospital Stay: Payer: Self-pay | Attending: Hematology and Oncology

## 2019-08-05 NOTE — Progress Notes (Signed)
Nutrition  Called patient for nutrition follow-up.  No answer.  Left message on voice mail with call back number.  Marialy Urbanczyk B. Janelys Glassner, RD, LDN Registered Dietitian [phone] (pager)   

## 2019-08-18 ENCOUNTER — Telehealth: Payer: Self-pay

## 2019-08-18 DIAGNOSIS — C01 Malignant neoplasm of base of tongue: Secondary | ICD-10-CM

## 2019-08-18 NOTE — Telephone Encounter (Signed)
Survivorship Care Plan visit completed.  Treatment summary reviewed and mailed to patient.  ASCO answers booklet reviewed and mailed to patient.  CARE program and Cancer Transitions discussed with patient along with other resources cancer center offers to patients and caregivers.  Patient verbalized understanding.  SCP packet mailed.  Patient in agreement for APP to have a Virtual visit to introduce them to the Survivorship Clinic.  Encouraged patient to call for any questions or concerns. 

## 2019-08-25 ENCOUNTER — Inpatient Hospital Stay (HOSPITAL_BASED_OUTPATIENT_CLINIC_OR_DEPARTMENT_OTHER): Payer: Self-pay | Admitting: Oncology

## 2019-08-25 DIAGNOSIS — C01 Malignant neoplasm of base of tongue: Secondary | ICD-10-CM

## 2019-08-25 NOTE — Progress Notes (Signed)
[place] note [facility]  Telephone:([phone] [phone]  Patient Care Team: Patient, No Pcp Per as PCP - General (General Practice) Margaretha Sheffield, MD (Otolaryngology) [name], MD as Referring Physician (Radiation Oncology) Lequita Asal, MD as Referring Physician (Hematology and Oncology)   Name of the patient: Connie Cameron  [id number]  [birth year]   Date of visit: 08/25/19  CLINIC:  Survivorship   REASON FOR VISIT:  Survivorship Care Plan visit & follow-up post-treatment for a recent history of head & neck cancer.  I connected with Connie Cameron on 08/25/2019 at  2:15 PM EDT by telephone visit and verified that I am speaking with the correct person using two identifiers.   I discussed the limitations, risks, security and privacy concerns of performing an evaluation and management service by telemedicine and the availability of in-person appointments. I also discussed with the patient that there may be a patient responsible charge related to this service. The patient expressed understanding and agreed to proceed.   Other persons participating in the visit and their role in the encounter: None  Patient's location: Home Provider's location: Office  BRIEF ONCOLOGIC HISTORY:  Oncology History  Carcinoma of base of tongue ([place])  02/04/2019 Initial Diagnosis   Carcinoma of base of tongue ([place])   03/30/2019 -  Chemotherapy   The patient had palonosetron (ALOXI) injection 0.25 mg, 0.25 mg, Intravenous,  Once, 3 of 3 cycles Administration: 0.25 mg (03/30/2019), 0.25 mg (04/20/2019), 0.25 mg (05/11/2019), 0.25 mg (05/18/2019) CISplatin (PLATINOL) 192 mg in sodium chloride 0.9 % 500 mL chemo infusion, 100 mg/m2 = 176 mg, Intravenous,  Once, 3 of 3 cycles Dose modification: 30 mg/m2 (original dose 100 mg/m2, Cycle 3, Reason: Other (see comments), Comment: switching to weekly dosing) Administration: 192 mg (03/30/2019), 192  mg (04/20/2019), 58 mg (05/11/2019), 58 mg (05/18/2019) fosaprepitant (EMEND) 150 mg in sodium chloride 0.9 % 145 mL IVPB, 150 mg, Intravenous,  Once, 3 of 3 cycles Administration: 150 mg (03/30/2019), 150 mg (04/20/2019), 150 mg (05/11/2019), 150 mg (05/18/2019)  for chemotherapy treatment.      INTERVAL HISTORY:  Connie Cameron presents to the Survivorship Clinic today for our initial meeting to review the survivorship care plan detailing her treatment course for head & neck cancer, as well as monitoring long-term side effects of that treatment, education regarding health maintenance, screening, and overall wellness and health promotion.     Overall, Connie Cameron reports feeling  quite well since completing her concurrent radiation/chemotherapy with Cisplatin completed April 2021.  She denies any complaints at this time.  How often I will see my providers: Survivor Year 1-2                                                       (Survivor Years defined by length of time since end of treatment (EOT)           When?              Responsible Provider  Otolaryngologist (ENT) Every 4-8 months (alternating appts with Radiation or Medical Oncologist) Dr. Kathyrn Sheriff  Radiation and/or Medical Oncologist  Every 4-8 months (alternating appts with ENT) Dr. Baruch Gouty  Primary Care Dentist Every 3-4 months   Lab Test - TSH Every 6-12 months [facility] provider or PCP  Lab Tests -  Other As needed [facility] provider  Scans/Imaging  As needed     ADDITIONAL REVIEW OF SYSTEMS:  Review of Systems  Constitutional: Negative.  Negative for chills, fever, malaise/fatigue and weight loss.  HENT: Negative for congestion, ear pain and tinnitus.   Eyes: Negative.  Negative for blurred vision and double vision.  Respiratory: Negative.  Negative for cough, sputum production and shortness of breath.   Cardiovascular: Negative.  Negative for chest pain, palpitations and leg swelling.  Gastrointestinal: Negative.  Negative for abdominal pain,  constipation, diarrhea, nausea and vomiting.  Genitourinary: Negative for dysuria, frequency and urgency.  Musculoskeletal: Negative for back pain and falls.  Skin: Negative.  Negative for rash.  Neurological: Negative.  Negative for weakness and headaches.  Endo/Heme/Allergies: Negative.  Does not bruise/bleed easily.  Psychiatric/Behavioral: Negative.  Negative for depression. The patient is not nervous/anxious and does not have insomnia.     PAST MEDICAL/SURGICAL HISTORY:  Past Medical History:  Diagnosis Date  . Throat cancer ([place]) 2021   Past Surgical History:  Procedure Laterality Date  . EXCISION MASS NECK Left 01/18/2019   Procedure: EXCISION MASS NECK/NODE;  Surgeon: Margaretha Sheffield, MD;  Location: [facility] ORS;  Service: ENT;  Laterality: Left;  . LARYNGOSCOPY Bilateral 02/22/2019   Procedure: MICROSCOPIC DIRECT LARYNGOSCOPY AND BIOPSY;  Surgeon: Margaretha Sheffield, MD;  Location: [facility] ORS;  Service: ENT;  Laterality: Bilateral;  . PORTA CATH INSERTION N/A 03/24/2019   Procedure: PORTA CATH INSERTION;  Surgeon: Katha Cabal, MD;  Location: [place] [facility];  Service: Cardiovascular;  Laterality: N/A;  . TONSILLECTOMY    . TUBAL LIGATION       ALLERGIES:  No Known Allergies    CURRENT MEDICATIONS:  Current Outpatient Medications on File Prior to Visit  Medication Sig Dispense Refill  . famotidine (PEPCID) 20 MG tablet Take 1 tablet (20 mg total) by mouth daily. 30 tablet 0  . lidocaine-prilocaine (EMLA) cream Apply to affected area once 30 g 3  . LORazepam (ATIVAN) 0.5 MG tablet Take 1 tablet (0.5 mg total) by mouth every 6 (six) hours as needed (Nausea or vomiting). (Patient not taking: Reported on 07/28/2019) 30 tablet 0  . Morphine Sulfate (MORPHINE CONCENTRATE) 10 mg / 0.5 ml concentrated solution Take 0.5 mLs (10 mg total) by mouth every 4 (four) hours as needed for severe pain. 30 mL 0  . ondansetron (ZOFRAN) 8 MG tablet Take 1 tablet (8 mg total) by mouth 2 (two)  times daily as needed. Start on the third day after chemotherapy. (Patient not taking: Reported on 07/28/2019) 30 tablet 1  . sucralfate (CARAFATE) 1 g tablet Take 1 tablet (1 g total) by mouth 3 (three) times daily before meals. Dissolve tablet in warm water, swish and swallow. 90 tablet 6  . traMADol (ULTRAM) 50 MG tablet Take 1-2 tablets (50-100 mg total) by mouth every 6 (six) hours as needed. 45 tablet 0   No current facility-administered medications on file prior to visit.       ONCOLOGIC FAMILY HISTORY:  Family History  Problem Relation Age of Onset  . Breast cancer Sister   . Brain cancer Paternal Grandfather       SOCIAL HISTORY:  Connie Cameron. Ingram is not married and lives alone in [place], [place].    PHYSICAL EXAMINATION:  Vital Signs: Limited due to telephone visit  There were no vitals filed for this visit.  LABORATORY DATA:  Lab Results  Component Value Date   WBC 3.5 (L) 07/28/2019  HGB 10.5 (L) 07/28/2019   HCT 31.2 (L) 07/28/2019   MCV 93.4 07/28/2019   PLT 190 07/28/2019     Chemistry      Component Value Date/Time   NA 139 07/28/2019 1048   NA 140 06/24/2011 1709   K 3.5 07/28/2019 1048   K 4.3 06/24/2011 1709   CL 104 07/28/2019 1048   CL 105 06/24/2011 1709   CO2 26 07/28/2019 1048   CO2 30 06/24/2011 1709   BUN 8 07/28/2019 1048   BUN 17 06/24/2011 1709   CREATININE 0.70 07/28/2019 1048   CREATININE 0.91 06/24/2011 1709      Component Value Date/Time   CALCIUM 9.5 07/28/2019 1048   CALCIUM 9.4 06/24/2011 1709   ALKPHOS 39 07/28/2019 1048   ALKPHOS 99 06/24/2011 1709   AST 18 07/28/2019 1048   AST 23 06/24/2011 1709   ALT 12 07/28/2019 1048   ALT 18 06/24/2011 1709   BILITOT 0.8 07/28/2019 1048   BILITOT 0.4 06/24/2011 1709       DIAGNOSTIC IMAGING:   CT chest from 07/27/2019 IMPRESSION: 1. No definite evidence of pulmonary embolus. 2. Stable 5 mm linear density is noted in the anterior portion of the left lower lobe. Follow-up CT scan in  12 months is recommended to ensure stability.   ASSESSMENT AND PLAN: Connie Cameron. 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  She presents to survivorship clinic today for routine follow-up after finishing treatment.  1.  Stage I tongue cancer:  Connie Cameron. [place] is continuing to recover from the effects of cancer treatment.  Per surveillance protocol, she will follow-up with her ENT physician in every 3 to 6 months with history and physical exam and laryngoscopy.  Today, a comprehensive survivorship care plan and treatment summary was reviewed with the patient today detailing her head & neck cancer diagnosis, treatment course, potential late/long-term effects of treatment, appropriate follow-up care with recommendations for the future, and patient education resources.  A copy of this summary, along with a letter will be sent to the patient's primary care provider via mail/fax/In Basket message after today's visit.  Connie Cameron. Sie[place] is welcome to return to the Survivorship Clinic in the future, as needed.  When she is approximately 2 years out from his diagnosis and treatment, she may be referred back to Survivorship for continued surveillance until her 5 year "graduation" from follow-up at the cancer center.   Upcoming appointments:   Upcoming Appointments:    Appointment Date & Time Who am I seeing?    Otolaryngologist (ENT)  [facility] Dr. Kathyrn Sheriff   Radiation Oncologist   11/10/2019 at 10:30 Dr. Baruch Gouty   Medical Oncologist  02/08/2020 at 10:30 Dr. Mike Gip   #. Problem(s) at Visit: None at this time  #. Nutritional status: Patient is followed by oncology RD Cyndi Bender.   #. At risk for dysphagia: Given Connie Cameron. Law's treatment for tongue cancer, which included surgery and radiation therapy, she is at risk for chronic dysphagia. Currently, the patient's reported swallowing concerns are to be expected and stable.    #.  At  risk for neck lymphedema:  When patients with head & neck cancers are treated with surgery and/or radiation therapy, there is an associated increased risk of neck lymphedema.   #.  At risk for hypothyroidism: The thyroid gland is often affected after treatment for head & neck cancer.  Connie Cameron. Rappa most recent TSH was normal at  [facility]  If appropriate, she will be prescribed thyroid supplement with Levothyroxine. she will continue to have serial TSH monitoring for at least the next 5 years as part of his routine follow-up and post-cancer treatment care.   #. At risk for tooth decay/dental concerns: After treatment with radiation for head & neck cancers, patients often experience xerostomia which increases their risk of dental caries. Connie Cameron. [place] was encouraged to see his dentist 3-4 times per year.  she should also continue to use her fluoride trays daily, as directed by dentistry.  I encouraged her to reach out to either Dr. Enrique Sack (oncology dentist) or her primary care dentist with additional questions or concerns.   #. Health maintenance and wellness promotion: Cancer patients who consume a diet rich in fruits and vegetables have better overall health and decreased risk of cancer recurrence. Connie Cameron. [place] was encouraged to consume 5-7 servings of fruits and vegetables per day, as tolerated. We reviewed the "Nutrition Rainbow" handout. she was also encouraged to engage in moderate to vigorous exercise for 30 minutes per day most days of the week. We discussed the CARE program, which is designed for cancer survivors to help them become more physically fit after cancer treatments.   #. Support services/counseling: It is not uncommon for this period of the patient's cancer care trajectory to be one of many emotions and stressors.  We discussed an opportunity for her to participate in the next session of Cancer Transitions support group series, designed for patients after they have completed treatment.   Connie Cameron. [place] was  encouraged to take advantage of our many other support services programs, support groups, and/or counseling in coping with her new life as a cancer survivor after completing anti-cancer treatment.  she was offered support today through active listening and expressive supportive counseling.  she was given information regarding our available services and encouraged to contact me with any questions or for help enrolling in any of our support group/programs.     Dispo:  -RTC to ENT Dr. Ladene Artist as scheduled -RTC to radiation oncology on 11/10/2019 -RTC as scheduled on 02/08/2020 for follow-up with Dr. Mike Gip -Consider referring the patient back to survivorship for long-term surveillance, when clinically appropriate.    I provided 20 minutes of non face-to-face telephone visit time during this encounter, and > 50% was spent counseling as documented under my assessment & plan.  Rulon Abide, NP, AGNP-C [place] at [facility]   Note: PRIMARY CARE PROVIDER Patient, No Pcp Per Phone:None Fax: None

## 2019-09-29 ENCOUNTER — Other Ambulatory Visit: Payer: Self-pay

## 2019-09-29 ENCOUNTER — Inpatient Hospital Stay: Payer: Medicaid Other | Attending: Hematology and Oncology

## 2019-09-29 DIAGNOSIS — C01 Malignant neoplasm of base of tongue: Secondary | ICD-10-CM

## 2019-09-29 DIAGNOSIS — Z452 Encounter for adjustment and management of vascular access device: Secondary | ICD-10-CM | POA: Diagnosis not present

## 2019-09-29 DIAGNOSIS — Z95828 Presence of other vascular implants and grafts: Secondary | ICD-10-CM

## 2019-09-29 MED ORDER — SODIUM CHLORIDE 0.9% FLUSH
10.0000 mL | INTRAVENOUS | Status: DC | PRN
  Administered 2019-09-29: 10 mL via INTRAVENOUS
  Filled 2019-09-29: qty 10

## 2019-09-29 MED ORDER — HEPARIN SOD (PORK) LOCK FLUSH 100 UNIT/ML IV SOLN
500.0000 [IU] | Freq: Once | INTRAVENOUS | Status: AC
  Administered 2019-09-29: 500 [IU] via INTRAVENOUS
  Filled 2019-09-29: qty 5

## 2019-10-06 ENCOUNTER — Other Ambulatory Visit: Payer: Self-pay

## 2019-10-08 ENCOUNTER — Telehealth: Payer: Self-pay

## 2019-10-08 NOTE — Telephone Encounter (Signed)
Faxed New patient referral to Dr Ronnald Ramp office fax conformation received. I have informed the patient she was understanding and agreeable.

## 2019-11-03 ENCOUNTER — Ambulatory Visit
Admission: RE | Admit: 2019-11-03 | Discharge: 2019-11-03 | Disposition: A | Discharge status: Home / Self Care | Payer: Medicaid Other | Source: Ambulatory Visit | Attending: Radiation Oncology | Admitting: Radiation Oncology

## 2019-11-03 ENCOUNTER — Other Ambulatory Visit: Payer: Self-pay

## 2019-11-03 DIAGNOSIS — C01 Malignant neoplasm of base of tongue: Secondary | ICD-10-CM

## 2019-11-03 MED ORDER — IOHEXOL 300 MG/ML  SOLN
75.0000 mL | Freq: Once | INTRAMUSCULAR | Status: AC | PRN
  Administered 2019-11-03: 75 mL via INTRAVENOUS

## 2019-11-09 ENCOUNTER — Encounter: Payer: Self-pay | Admitting: Radiation Oncology

## 2019-11-10 ENCOUNTER — Encounter: Payer: Self-pay | Admitting: Radiation Oncology

## 2019-11-10 ENCOUNTER — Ambulatory Visit
Admission: RE | Admit: 2019-11-10 | Discharge: 2019-11-10 | Disposition: A | Discharge status: Home / Self Care | Payer: Medicaid Other | Source: Ambulatory Visit | Attending: Radiation Oncology | Admitting: Radiation Oncology

## 2019-11-10 ENCOUNTER — Encounter (INDEPENDENT_AMBULATORY_CARE_PROVIDER_SITE_OTHER): Payer: Self-pay

## 2019-11-10 ENCOUNTER — Other Ambulatory Visit: Payer: Self-pay

## 2019-11-10 VITALS — BP 158/93 | HR 86 | Temp 96.2°F | Wt 128.0 lb

## 2019-11-10 DIAGNOSIS — C01 Malignant neoplasm of base of tongue: Secondary | ICD-10-CM

## 2019-11-10 NOTE — Progress Notes (Signed)
Radiation Oncology Follow up Note  Name: Connie Cameron   Date:   11/10/2019 MRN:  [id number] DOB: Sep 15, 1960    This 59 y.o. 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   REFERRING PROVIDER: No ref. 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  She still has some loss of taste.  She specifically denies dysphagia or head and neck pain.  She does have some radicular type pain into her left lower extremity from possible lumbar spine problems.  That has yet to be investigated.  She had a recent CT scan of the head and neck showing no evidence of recurrent tumor or adenopathy.  She has some post radiation changes in the anterior neck and larynx.  I have also reviewed her prior PET CT scan back in 2020 showing no evidence of disease or lumbar spine.  She may need imaging at some point.   COMPLICATIONS OF TREATMENT: none  FOLLOW UP COMPLIANCE: keeps appointments   PHYSICAL EXAM:  BP (!) 158/93   Pulse 86   Temp (!) 96.2 F (35.7 C) (Tympanic)   Wt 128 lb (58.1 kg)   BMI 22.67 kg/m  No evidence of cervical or supraclavicular adenopathy.  Well-developed well-nourished patient in NAD. HEENT reveals PERLA, EOMI, discs not visualized.  Oral cavity is clear. No oral mucosal lesions are identified. Neck is clear without evidence of cervical or supraclavicular adenopathy. Lungs are clear to A&P. Cardiac examination is essentially unremarkable with regular rate and rhythm without murmur rub or thrill. Abdomen is benign with no organomegaly or masses noted. Motor sensory and DTR levels are equal and symmetric in the upper and lower extremities. Cranial nerves II through XII are grossly intact. Proprioception  is intact. No peripheral adenopathy or edema is identified. No motor or sensory levels are noted. Crude visual fields are within normal range.  RADIOLOGY RESULTS: CT scan and PET CT scans reviewed compatible with above-stated findings  PLAN: At the present time patient is doing well with no evidence of disease.Recent CT scan at [facility] showed increased no evidence of cervical or supraclavicular adenopathy.  I am pleased with her overall progress.  I have asked to see her back in 6 months for follow-up.  I have also asked her to establish care with a primary care physician and possibly orthopedic consultation should her radicular type left lower extremity pain worsen.  Patient is to call with any concerns.  I would like to take this opportunity to thank you for allowing me to participate in the care of your patient.[name], MD

## 2019-12-01 ENCOUNTER — Other Ambulatory Visit: Payer: Self-pay

## 2019-12-01 ENCOUNTER — Inpatient Hospital Stay: Payer: Medicaid Other | Attending: Hematology and Oncology

## 2019-12-01 DIAGNOSIS — C01 Malignant neoplasm of base of tongue: Secondary | ICD-10-CM | POA: Insufficient documentation

## 2019-12-01 DIAGNOSIS — Z452 Encounter for adjustment and management of vascular access device: Secondary | ICD-10-CM | POA: Insufficient documentation

## 2019-12-01 DIAGNOSIS — Z95828 Presence of other vascular implants and grafts: Secondary | ICD-10-CM

## 2019-12-01 MED ORDER — HEPARIN SOD (PORK) LOCK FLUSH 100 UNIT/ML IV SOLN
500.0000 [IU] | Freq: Once | INTRAVENOUS | Status: AC
  Administered 2019-12-01: 500 [IU] via INTRAVENOUS
  Filled 2019-12-01: qty 5

## 2019-12-01 MED ORDER — SODIUM CHLORIDE 0.9% FLUSH
10.0000 mL | INTRAVENOUS | Status: DC | PRN
  Administered 2019-12-01: 10 mL via INTRAVENOUS
  Filled 2019-12-01: qty 10

## 2020-02-07 DIAGNOSIS — K117 Disturbances of salivary secretion: Secondary | ICD-10-CM | POA: Insufficient documentation

## 2020-02-07 NOTE — Progress Notes (Signed)
Connie Cameron  [address] [place], [place] [address] Phone: [phone]  Fax: [phone]   Clinic Day:  02/08/2020  Referring physician: No ref. provider found  Chief Complaint: Connie Cameron is a 60 y.o. 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   HPI: The patient was last seen in the medical oncology clinic on 07/28/2019.  At that time, she was doing well.  She had shoulder pain.  Her mouth was dry.  She had lost 10 pounds unintentionally.  Exam was stable. Left shoulder films revealed possible narrowing of the acromial humeral interval with associated degenerative change.  There were no worrisome lytic or sclerotic changes.  Hematocrit was 31.2, hemoglobin 10.5, MCV 93.4, platelets 190,000, WBC 3,500 (ANC 2,400). CMP was normal. Retic count was 0.9%. Copper was 88.  Connie Cameron, RD called the patient on 08/05/2019 but there was no answer.  The patient spoke to Faythe Casa, NP by phone on 08/25/2019 for a survivorship visit. She was feeling well and denied any complaints.  Neck CT with contrast on 11/03/2019 revealed no evidence of recurrent tumor or adenopathy. There were post radiation changes in the anterior neck and larynx.  The patient saw Dr. Baruch Gouty on 11/10/2019. She was doing well with no evidence of disease. She reported some loss of taste. Follow-up was planned for 6 months.  During the interim, she has been "fine". She continues to lose weight and is not sure why. She thought she was gaining weight, though she notes that everyone around her says she is losing weight. Yesterday, she ate an egg sandwich, baked potato, mashed potato, pork chop, and turnip greens. She is eating normal portion sizes. She denies blood in the stool, black stool, nausea, vomiting, and diarrhea. She does not have an ideal weight.  The patient has been having  episodes of middle chest discomfort accompanied by dizziness. The worst episode occurred 2 days ago while she was using the bathroom; she stood up and felt like her legs didn't work. She had numb numbness on the left side of her face. She layed on the couch for an hour and felt better. She denies known cardiac disease. If she gets stressed or angry, she gets short of breath.  She still hears chirping. Her sense of taste has returned to normal.  She does not have a PCP. She has never had a mammogram or colonoscopy. She agrees to a mammogram and GI evaluation.  She agrees to CT scans to evaluate her unintentional weight loss (25 pounds).   Past Medical History:  Diagnosis Date  . Throat cancer ([place]) 2021    Past Surgical History:  Procedure Laterality Date  . EXCISION MASS NECK Left 01/18/2019   Procedure: EXCISION MASS NECK/NODE;  Surgeon: Margaretha Sheffield, MD;  Location: [facility] ORS;  Service: ENT;  Laterality: Left;  . LARYNGOSCOPY Bilateral 02/22/2019   Procedure: MICROSCOPIC DIRECT LARYNGOSCOPY AND BIOPSY;  Surgeon: Margaretha Sheffield, MD;  Location: [facility] ORS;  Service: ENT;  Laterality: Bilateral;  . PORTA CATH INSERTION N/A 03/24/2019   Procedure: PORTA CATH INSERTION;  Surgeon: Katha Cabal, MD;  Location: [facility];  Service: Cardiovascular;  Laterality: N/A;  . TONSILLECTOMY    . TUBAL LIGATION      Family History  Problem Relation Age of Onset  . Breast cancer Sister   . Brain cancer Paternal Grandfather  Social History:  reports that she has never smoked. She has never used smokeless tobacco. She reports previous alcohol use. She reports that she does not use drugs. She use to drink years ago. She denies any exposure to radiation or toxins. 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 She lives in [place]. The patient is alone today.  Allergies: No Known Allergies  Current Medications: Current Outpatient Medications  Medication Sig  Dispense Refill  . lidocaine-prilocaine (EMLA) cream Apply to affected area once 30 g 3  . ondansetron (ZOFRAN) 8 MG tablet Take 1 tablet (8 mg total) by mouth 2 (two) times daily as needed. Start on the third day after chemotherapy. 30 tablet 1  . famotidine (PEPCID) 20 MG tablet Take 1 tablet (20 mg total) by mouth daily. (Patient not taking: No sig reported) 30 tablet 0  . LORazepam (ATIVAN) 0.5 MG tablet Take 1 tablet (0.5 mg total) by mouth every 6 (six) hours as needed (Nausea or vomiting). (Patient not taking: No sig reported) 30 tablet 0  . Morphine Sulfate (MORPHINE CONCENTRATE) 10 mg / 0.5 ml concentrated solution Take 0.5 mLs (10 mg total) by mouth every 4 (four) hours as needed for severe pain. (Patient not taking: No sig reported) 30 mL 0  . traMADol (ULTRAM) 50 MG tablet Take 1-2 tablets (50-100 mg total) by mouth every 6 (six) hours as needed. (Patient not taking: No sig reported) 45 tablet 0   No current facility-administered medications for this visit.   Facility-Administered Medications Ordered in Other Visits  Medication Dose Route Frequency Provider Last Rate Last Admin  . sodium chloride flush (NS) 0.9 % injection 10 mL  10 mL Intravenous PRN Nolon Stalls C, MD   10 mL at 02/08/20 1008    Review of Systems  Constitutional: Positive for weight loss (9 lbs since last visit; 25 pounds since 06/30/2019). Negative for chills, diaphoresis, fever and malaise/fatigue.  HENT: Positive for hearing loss (left ear). Negative for congestion, ear discharge, ear pain, nosebleeds, sinus pain, sore throat and tinnitus.        Hears "chirping."  Eyes: Negative.  Negative for blurred vision, double vision and photophobia.  Respiratory: Negative.  Negative for cough, hemoptysis, sputum production and shortness of breath.   Cardiovascular: Positive for chest pain (discomfort). Negative for palpitations and leg swelling.  Gastrointestinal: Negative for abdominal pain, blood in stool,  constipation, diarrhea, heartburn, melena, nausea and vomiting.       Eating well.  Genitourinary: Negative for dysuria, frequency and urgency.  Musculoskeletal: Negative.  Negative for back pain, falls, joint pain and myalgias.  Skin: Negative for itching and rash.  Neurological: Positive for dizziness (with chest pain). Negative for tingling, sensory change, speech change, focal weakness, weakness and headaches.       Episode of numbness on left side of face.  Endo/Heme/Allergies: Negative.  Negative for environmental allergies. Does not bruise/bleed easily.  Psychiatric/Behavioral: Negative.  Negative for depression and memory loss. The patient is not nervous/anxious and does not have insomnia.   All other systems reviewed and are negative.  Performance status (ECOG): 1  Vitals Blood pressure (!) 145/88, pulse 72, temperature (!) 97.2 F (36.2 C), temperature source Tympanic, resp. rate 16, weight 128 lb 15.5 oz (58.5 kg), SpO2 100 %.   Physical Exam Vitals and nursing note reviewed.  Constitutional:      General: She is not in acute distress.    Appearance: She is well-developed.  She is not diaphoretic.     Interventions: Face mask in place.  HENT:     Head: Normocephalic and atraumatic.     Comments: Long blonde hair.    Mouth/Throat:     Mouth: No oral lesions.     Pharynx: Oropharynx is clear. No oropharyngeal exudate.  Eyes:     General: No scleral icterus.    Extraocular Movements: Extraocular movements intact.     Conjunctiva/sclera: Conjunctivae normal.     Pupils: Pupils are equal, round, and reactive to light.     Comments: Blue eyes.  Neck:     Vascular: No JVD.  Cardiovascular:     Rate and Rhythm: Normal rate and regular rhythm.     Pulses: Normal pulses.     Heart sounds: Normal heart sounds. No murmur heard. No friction rub. No gallop.   Pulmonary:     Effort: Pulmonary effort is normal. No respiratory distress.     Breath sounds: Normal breath sounds. No  wheezing, rhonchi or rales.  Chest:     Chest wall: No tenderness.  Breasts:     Right: No axillary adenopathy or supraclavicular adenopathy.     Left: No axillary adenopathy or supraclavicular adenopathy.    Abdominal:     General: Bowel sounds are normal. There is no distension.     Palpations: Abdomen is soft. There is no hepatomegaly, splenomegaly or mass.     Tenderness: There is no abdominal tenderness. There is no guarding or rebound.  Musculoskeletal:        General: No swelling or tenderness. Normal range of motion.     Cervical back: Normal range of motion and neck supple.  Lymphadenopathy:     Head:     Right side of head: No preauricular, posterior auricular or occipital adenopathy.     Left side of head: No preauricular, posterior auricular or occipital adenopathy.     Cervical: No cervical adenopathy.     Upper Body:     Right upper body: No supraclavicular or axillary adenopathy.     Left upper body: No supraclavicular or axillary adenopathy.     Lower Body: No right inguinal adenopathy. No left inguinal adenopathy.  Skin:    General: Skin is warm and dry.     Coloration: Skin is not pale.     Findings: No bruising, erythema, lesion or rash.  Neurological:     Mental Status: She is alert and oriented to person, place, and time.  Psychiatric:        Behavior: Behavior normal.        Thought Content: Thought content normal.        Judgment: Judgment normal.    Infusion on 02/08/2020  Component Date Value Ref Range Status  . Magnesium 02/08/2020 2.0  1.7 - 2.4 mg/dL Final   Performed at [facility] [address], [place], [place] [address]  . Sodium 02/08/2020 138  135 - 145 mmol/L Final  . Potassium 02/08/2020 3.7  3.5 - 5.1 mmol/L Final  . Chloride 02/08/2020 104  98 - 111 mmol/L Final  . CO2 02/08/2020 25  22 - 32 mmol/L Final  . Glucose, Bld 02/08/2020 116* 70 - 99 mg/dL Final   Glucose reference range applies only to samples taken after  fasting for at least 8 hours.  . BUN 02/08/2020 16  6 - 20 mg/dL Final  . Creatinine, Ser 02/08/2020 0.91  0.44 - 1.00 mg/dL Final  . Calcium 35/00/9381  9.5  8.9 - 10.3 mg/dL Final  . Total Protein 02/08/2020 7.1  6.5 - 8.1 g/dL Final  . Albumin 02/08/2020 4.3  3.5 - 5.0 g/dL Final  . AST 02/08/2020 22  15 - 41 U/L Final  . ALT 02/08/2020 16  0 - 44 U/L Final  . Alkaline Phosphatase 02/08/2020 66  38 - 126 U/L Final  . Total Bilirubin 02/08/2020 1.0  0.3 - 1.2 mg/dL Final  . GFR, Estimated 02/08/2020 >60  >60 mL/min Final   Comment: (NOTE) Calculated using the CKD-EPI Creatinine Equation (2021)   . Anion gap 02/08/2020 9  5 - 15 Final   Performed at [facility], [address] [place], [place] [address]  . WBC 02/08/2020 4.3  4.0 - 10.5 K/uL Final  . RBC 02/08/2020 3.70* 3.87 - 5.11 MIL/uL Final  . Hemoglobin 02/08/2020 11.1* 12.0 - 15.0 g/dL Final  . HCT 02/08/2020 33.2* 36.0 - 46.0 % Final  . MCV 02/08/2020 89.7  80.0 - 100.0 fL Final  . MCH 02/08/2020 30.0  26.0 - 34.0 pg Final  . MCHC 02/08/2020 33.4  30.0 - 36.0 g/dL Final  . RDW 02/08/2020 12.6  11.5 - 15.5 % Final  . Platelets 02/08/2020 213  150 - 400 K/uL Final  . nRBC 02/08/2020 0.0  0.0 - 0.2 % Final  . Neutrophils Relative % 02/08/2020 69  % Final  . Neutro Abs 02/08/2020 3.0  1.7 - 7.7 K/uL Final  . Lymphocytes Relative 02/08/2020 18  % Final  . Lymphs Abs 02/08/2020 0.8  0.7 - 4.0 K/uL Final  . Monocytes Relative 02/08/2020 9  % Final  . Monocytes Absolute 02/08/2020 0.4  0.1 - 1.0 K/uL Final  . Eosinophils Relative 02/08/2020 2  % Final  . Eosinophils Absolute 02/08/2020 0.1  0.0 - 0.5 K/uL Final  . Basophils Relative 02/08/2020 1  % Final  . Basophils Absolute 02/08/2020 0.0  0.0 - 0.1 K/uL Final  . Immature Granulocytes 02/08/2020 1  % Final  . Abs Immature Granulocytes 02/08/2020 0.02  0.00 - 0.07 K/uL Final   Performed at [facility], [address], [place], [place] [address]     Assessment:  MISHAL CUSHMAN is a 60 y.o. 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 Pathologyrevealed poorly differentiated squamous cell carcinoma. Malignant cells are positive for p16, p40, and pancytokeratin.   She presented with left cervical adenopathy. 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 Node was 4.0 x 2.3 x 1.8 cm.  Neck CTon 12/21/2018 revealed a 2.4 x 2.1 x 3.4 cm lymph node in the left upper neck. The lymph node showed heterogeneous enhancement and stranding in the surrounding fat. There was a 6 mm left upper posterior lymph node and a 7 mm right level 3 lymph node. Imaging was concerning for malignancy. There were no other enlarged lymph nodes. There was no pharyngeal mass. Neck CT with contrast on 11/03/2019 revealed no evidence of recurrent tumor or adenopathy. There were post radiation changes in the anterior neck and larynx.  PET scan on 02/04/2019 revealed prior left level II cervical node had been excised. There were no findings on PET to suggest a site of primary head/neck cancer. There was symmetric hypermetabolism along the base of the tongue bilaterally favored to be physiologic. There was no evidence of metastatic disease.  Audiogramon 03/11/2019 revealed normal hearing in the right earthrough 8 kHz with mild hearing loss at 12 kHz only. Hearing was within normal limits in the left earthrough 3 kHz with mild-severe hearing loss from 4-12 kHz. Audiogram on 04/30/2019 revealed significant decrease in high-frequency hearing. 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  She received radiation from 03/30/2019 - 05/25/2019. She is s/p 2 cycles of cisplatin100 mg/m2 (03/30/2019 - 04/20/2019).  She received weekly cisplatin 30 mg/m2 x2  (05/11/2019 and 05/18/2019).  She received filgrastim-sndz support on 05/12/2019 and 05/13/2019.   Left lower extremity duplex on 07/14/2019 revealed no evidence of DVT.  Chest CT angiogram on 07/27/2019 revealed no definite evidence of pulmonary embolus.  There was a stable 5 mm linear density in the anterior portion of the left lower lobe. Follow-up CT scan in 12 months was recommended to ensure stability.  She was admitted to [facility] 04/02/2019 -04/06/2019 with cellulitis involving the trapezius and right supraclavicular area. Port-a-cath was uninvolved. She received cefepime and vancomycin. She was discharged on doxycycline.  She was admitted to [facility] from 05/02/2019 to 05/05/2019 with fever of 100.5 and radiation mucositis. Patient was unable to eat or drink fluids secondary to sore throat.  Radiation was held. She was started on Decadron 4 mg BID.   Symptomatically, she has lost 19 pounds unintentionally since 06/2019. She denies any nausea, vomiting or diarrhea.  She is eating well.  She experienced an isolated episode of mid chest discomfort and transient left facial numbness.  Neurologic exam is normal.  Plan: 1.   Labs today: CBC with diff, CMP, Mg. 2.Stage I left base of tongue carcinoma  Clinically, she is doing fair.   She has unexpected weight loss.   She is eating well; denies taste alteration.  Exam reveals no evidence of recurrent disease. She completed concurrent cisplatin and radiation (05/25/2019).             Neck CT on 11/03/2019 personally reviewed.  Agree with radiology interpretation.   No evidence of locally recurrent disease. 3.   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 4.   Normocytic anemia  Hematocrit 25.9.  Hemoglobin 8.5.    MCV 98.5 on 06/30/2019.  Hematocrit 31.2.  Hemoglobin 10.5.  MCV 93.4 on 07/28/2019.  Hematocrit 33.2.   Hemoglobin 11.1.  MCV 89.7 on 02/08/2020.  CBC continues to improve following completion of treatment.  5.   Weight loss  Patient previously weighed 153 pounds on 06/30/2019.    She currently weighs 128 pounds.  Patient has lost 25 pounds.  Weight loss is unintentional.  She is eating well.  No nausea, vomiting or diarrhea.  Check TSH and free T4 (previously normal on 06/30/2019).  Discuss plans for chest, abdomen and pelvis CT.   Patient in agreement. 6.  Chest pain  Patient notes history of transient midsternal chest pain of unclear etiology.  She also notes a transient left-sided facial/jaw numbness.  Encourage patient to follow-up with PCP or ER for acute symptoms 7.   Health maintenance  Patient has never had a mammogram.  Patient has never had a colonoscopy.  Schedule mammogram and GI consult.. 8.   Mammogram 02/14/2020. 9.   Chest, abdomen, and pelvis CT on 02/14/2020. 10.   GI consult. 11.   RN:  Assist with obtaining PCP (provider noted on her card).  12.   RTC after imaging studies for MD assessment and discussion regarding direction of therapy.  I discussed the assessment and treatment plan with the patient. The patient was provided an opportunity to ask questions and all were answered. The patient agreed with the plan and demonstrated an understanding of the instructions. The patient was advised to call back if the symptoms worsen or if the condition fails to improve as anticipated.   Lequita Asal, MD, PhD    02/08/2020, 11:30 AM  I, De Burrs, am acting as a Education administrator for Lequita Asal, MD.  I, Par[place] Mike Gip, MD, have reviewed the above documentation for accuracy and completeness, and I agree with the above.

## 2020-02-08 ENCOUNTER — Other Ambulatory Visit: Payer: Self-pay

## 2020-02-08 ENCOUNTER — Telehealth: Payer: Self-pay

## 2020-02-08 ENCOUNTER — Encounter: Payer: Self-pay | Admitting: Hematology and Oncology

## 2020-02-08 ENCOUNTER — Inpatient Hospital Stay: Payer: Medicaid Other

## 2020-02-08 ENCOUNTER — Inpatient Hospital Stay: Payer: Medicaid Other | Attending: Hematology and Oncology | Admitting: Hematology and Oncology

## 2020-02-08 VITALS — BP 145/88 | HR 72 | Temp 97.2°F | Resp 16 | Wt 129.0 lb

## 2020-02-08 DIAGNOSIS — R634 Abnormal weight loss: Secondary | ICD-10-CM | POA: Insufficient documentation

## 2020-02-08 DIAGNOSIS — R0789 Other chest pain: Secondary | ICD-10-CM | POA: Insufficient documentation

## 2020-02-08 DIAGNOSIS — R200 Anesthesia of skin: Secondary | ICD-10-CM | POA: Insufficient documentation

## 2020-02-08 DIAGNOSIS — Z808 Family history of malignant neoplasm of other organs or systems: Secondary | ICD-10-CM | POA: Insufficient documentation

## 2020-02-08 DIAGNOSIS — C01 Malignant neoplasm of base of tongue: Secondary | ICD-10-CM | POA: Diagnosis present

## 2020-02-08 DIAGNOSIS — R439 Unspecified disturbances of smell and taste: Secondary | ICD-10-CM | POA: Diagnosis not present

## 2020-02-08 DIAGNOSIS — H9042 Sensorineural hearing loss, unilateral, left ear, with unrestricted hearing on the contralateral side: Secondary | ICD-10-CM | POA: Insufficient documentation

## 2020-02-08 DIAGNOSIS — Z0000 Encounter for general adult medical examination without abnormal findings: Secondary | ICD-10-CM | POA: Insufficient documentation

## 2020-02-08 DIAGNOSIS — Z79899 Other long term (current) drug therapy: Secondary | ICD-10-CM | POA: Insufficient documentation

## 2020-02-08 DIAGNOSIS — Z923 Personal history of irradiation: Secondary | ICD-10-CM | POA: Diagnosis not present

## 2020-02-08 DIAGNOSIS — Z1239 Encounter for other screening for malignant neoplasm of breast: Secondary | ICD-10-CM | POA: Insufficient documentation

## 2020-02-08 DIAGNOSIS — R42 Dizziness and giddiness: Secondary | ICD-10-CM | POA: Insufficient documentation

## 2020-02-08 DIAGNOSIS — D649 Anemia, unspecified: Secondary | ICD-10-CM | POA: Diagnosis not present

## 2020-02-08 DIAGNOSIS — D72819 Decreased white blood cell count, unspecified: Secondary | ICD-10-CM

## 2020-02-08 DIAGNOSIS — Z803 Family history of malignant neoplasm of breast: Secondary | ICD-10-CM | POA: Diagnosis not present

## 2020-02-08 DIAGNOSIS — Z1231 Encounter for screening mammogram for malignant neoplasm of breast: Secondary | ICD-10-CM | POA: Insufficient documentation

## 2020-02-08 DIAGNOSIS — J029 Acute pharyngitis, unspecified: Secondary | ICD-10-CM | POA: Diagnosis not present

## 2020-02-08 DIAGNOSIS — K117 Disturbances of salivary secretion: Secondary | ICD-10-CM

## 2020-02-08 DIAGNOSIS — Z95828 Presence of other vascular implants and grafts: Secondary | ICD-10-CM

## 2020-02-08 DIAGNOSIS — M25519 Pain in unspecified shoulder: Secondary | ICD-10-CM | POA: Insufficient documentation

## 2020-02-08 LAB — CBC WITH DIFFERENTIAL/PLATELET
Abs Immature Granulocytes: 0.02 10*3/uL (ref 0.00–0.07)
Basophils Absolute: 0 10*3/uL (ref 0.0–0.1)
Basophils Relative: 1 %
Eosinophils Absolute: 0.1 10*3/uL (ref 0.0–0.5)
Eosinophils Relative: 2 %
HCT: 33.2 % — ABNORMAL LOW (ref 36.0–46.0)
Hemoglobin: 11.1 g/dL — ABNORMAL LOW (ref 12.0–15.0)
Immature Granulocytes: 1 %
Lymphocytes Relative: 18 %
Lymphs Abs: 0.8 10*3/uL (ref 0.7–4.0)
MCH: 30 pg (ref 26.0–34.0)
MCHC: 33.4 g/dL (ref 30.0–36.0)
MCV: 89.7 fL (ref 80.0–100.0)
Monocytes Absolute: 0.4 10*3/uL (ref 0.1–1.0)
Monocytes Relative: 9 %
Neutro Abs: 3 10*3/uL (ref 1.7–7.7)
Neutrophils Relative %: 69 %
Platelets: 213 10*3/uL (ref 150–400)
RBC: 3.7 MIL/uL — ABNORMAL LOW (ref 3.87–5.11)
RDW: 12.6 % (ref 11.5–15.5)
WBC: 4.3 10*3/uL (ref 4.0–10.5)
nRBC: 0 % (ref 0.0–0.2)

## 2020-02-08 LAB — COMPREHENSIVE METABOLIC PANEL
ALT: 16 U/L (ref 0–44)
AST: 22 U/L (ref 15–41)
Albumin: 4.3 g/dL (ref 3.5–5.0)
Alkaline Phosphatase: 66 U/L (ref 38–126)
Anion gap: 9 (ref 5–15)
BUN: 16 mg/dL (ref 6–20)
CO2: 25 mmol/L (ref 22–32)
Calcium: 9.5 mg/dL (ref 8.9–10.3)
Chloride: 104 mmol/L (ref 98–111)
Creatinine, Ser: 0.91 mg/dL (ref 0.44–1.00)
GFR, Estimated: >60 mL/min (ref >60)
Glucose, Bld: 116 mg/dL — ABNORMAL HIGH (ref 70–99)
Potassium: 3.7 mmol/L (ref 3.5–5.1)
Sodium: 138 mmol/L (ref 135–145)
Total Bilirubin: 1 mg/dL (ref 0.3–1.2)
Total Protein: 7.1 g/dL (ref 6.5–8.1)

## 2020-02-08 LAB — T4, FREE: Free T4: 0.92 ng/dL (ref 0.61–1.12)

## 2020-02-08 LAB — MAGNESIUM: Magnesium: 2 mg/dL (ref 1.7–2.4)

## 2020-02-08 MED ORDER — HEPARIN SOD (PORK) LOCK FLUSH 100 UNIT/ML IV SOLN
500.0000 [IU] | Freq: Once | INTRAVENOUS | Status: AC
  Administered 2020-02-08: 500 [IU] via INTRAVENOUS
  Filled 2020-02-08: qty 5

## 2020-02-08 MED ORDER — SODIUM CHLORIDE 0.9% FLUSH
10.0000 mL | INTRAVENOUS | Status: DC | PRN
  Administered 2020-02-08: 10 mL via INTRAVENOUS
  Filled 2020-02-08: qty 10

## 2020-02-08 NOTE — Progress Notes (Signed)
Patient states she is having episodes of chest pains with dizziness.

## 2020-02-08 NOTE — Telephone Encounter (Signed)
I called to speak with patient in regards to patient wanting her PCP changed. A representative from [employer] (sharon) stated that Connie Cameron her self has to be the one to call.

## 2020-02-10 LAB — TSH: TSH: 3.906 u[IU]/mL (ref 0.350–4.500)

## 2020-02-14 NOTE — Progress Notes (Signed)
..  The following Assist/Replace Program for [place] from [employer] has been terminated due to Medicaid starting 01/05/2020.  Last DOS:05/13/2019

## 2020-02-23 ENCOUNTER — Other Ambulatory Visit: Payer: Self-pay

## 2020-02-23 ENCOUNTER — Encounter: Payer: Self-pay | Admitting: Gastroenterology

## 2020-02-23 ENCOUNTER — Telehealth (INDEPENDENT_AMBULATORY_CARE_PROVIDER_SITE_OTHER): Payer: Medicaid Other | Admitting: Gastroenterology

## 2020-02-23 ENCOUNTER — Telehealth: Payer: Medicaid Other

## 2020-02-23 DIAGNOSIS — R1319 Other dysphagia: Secondary | ICD-10-CM

## 2020-02-23 DIAGNOSIS — Z1211 Encounter for screening for malignant neoplasm of colon: Secondary | ICD-10-CM

## 2020-02-23 DIAGNOSIS — R634 Abnormal weight loss: Secondary | ICD-10-CM

## 2020-02-23 MED ORDER — NA SULFATE-K SULFATE-MG SULF 17.5-3.13-1.6 GM/177ML PO SOLN: ORAL | 0 refills | Status: DC

## 2020-02-24 NOTE — Progress Notes (Signed)
Connie Cameron [address]  [place]  [place], [place] [address]  Main: [phone]  Fax: [phone]   Gastroenterology Consultation  Referring Provider:     Lequita Asal, MD Primary Care Physician:  Patient, No Pcp Per Reason for Consultation:    Weight loss        HPI:   Virtual Visit via Telephone Note  I connected with patient on 02/24/20 at  2:00 PM EST by telephone and verified that I am speaking with the correct person using two identifiers.   I discussed the limitations, risks, security and privacy concerns of performing an evaluation and management service by telephone and the availability of in person appointments. I also discussed with the patient that there may be a patient responsible charge related to this service. The patient expressed understanding and agreed to proceed.  Location of the patient: Home Location of provider: Home Participating persons: Patient and provider only   History of Present Illness: Chief complaint: Weight loss  Connie Cameron is a 60 y.o. y/o female referred for consultation & management  by Dr. Patient, No Pcp Per.  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  No prior EGD and colonoscopy.  Patient reports eating well but despite this continues to lose weight.  Does report some solid food dysphagia intermittently for the last 3 to 6 months.  No blood in stool, no altered bowel habits.  No family history of colon cancer.  No abdominal pain.  Past Medical History:  Diagnosis Date  . Throat cancer ([place]) 2021    Past Surgical History:  Procedure Laterality Date  . EXCISION MASS NECK Left 01/18/2019   Procedure: EXCISION MASS NECK/NODE;  Surgeon: Margaretha Sheffield, MD;  Location: [facility] ORS;  Service: ENT;  Laterality: Left;  . LARYNGOSCOPY Bilateral 02/22/2019   Procedure: MICROSCOPIC DIRECT  LARYNGOSCOPY AND BIOPSY;  Surgeon: Margaretha Sheffield, MD;  Location: [facility] ORS;  Service: ENT;  Laterality: Bilateral;  . PORTA CATH INSERTION N/A 03/24/2019   Procedure: PORTA CATH INSERTION;  Surgeon: Katha Cabal, MD;  Location: [facility];  Service: Cardiovascular;  Laterality: N/A;  . TONSILLECTOMY    . TUBAL LIGATION      Prior to Admission medications   Medication Sig Start Date End Date Taking? Authorizing Provider  Na Sulfate-K Sulfate-Mg Sulf 17.5-3.13-1.6 GM/177ML SOLN At 5 PM the day before procedure take 1 bottle and 5 hours before procedure take 1 bottle. 02/23/20  Yes Virgel Manifold, MD    Family History  Problem Relation Age of Onset  . Breast cancer Sister   . Brain cancer Paternal Grandfather      Social History   Tobacco Use  . Smoking status: Never Smoker  . Smokeless tobacco: Never Used  Vaping Use  . Vaping Use: Never used  Substance Use Topics  . Alcohol use: Not Currently  . Drug use: No    Allergies as of 02/23/2020  . (No Known Allergies)    Review of Systems:    All systems reviewed and negative except where noted in HPI.   Observations/Objective:  Labs: CBC    Component Value Date/Time   WBC 4.3 02/08/2020 1006   RBC 3.70 (L) 02/08/2020 1006   HGB 11.1 (L) 02/08/2020 1006   HGB 13.7 06/24/2011 1709   HCT 33.2 (L) 02/08/2020 1006   HCT 41.4 06/24/2011  1709   PLT 213 02/08/2020 1006   PLT 304 06/24/2011 1709   MCV 89.7 02/08/2020 1006   MCV 87 06/24/2011 1709   MCH 30.0 02/08/2020 1006   MCHC 33.4 02/08/2020 1006   RDW 12.6 02/08/2020 1006   RDW 14.2 06/24/2011 1709   LYMPHSABS 0.8 02/08/2020 1006   MONOABS 0.4 02/08/2020 1006   EOSABS 0.1 02/08/2020 1006   BASOSABS 0.0 02/08/2020 1006   CMP     Component Value Date/Time   NA 138 02/08/2020 1006   NA 140 06/24/2011 1709   K 3.7 02/08/2020 1006   K 4.3 06/24/2011 1709   CL 104 02/08/2020 1006   CL 105 06/24/2011 1709   CO2 25 02/08/2020 1006   CO2 30  06/24/2011 1709   GLUCOSE 116 (H) 02/08/2020 1006   GLUCOSE 101 (H) 06/24/2011 1709   BUN 16 02/08/2020 1006   BUN 17 06/24/2011 1709   CREATININE 0.91 02/08/2020 1006   CREATININE 0.91 06/24/2011 1709   CALCIUM 9.5 02/08/2020 1006   CALCIUM 9.4 06/24/2011 1709   PROT 7.1 02/08/2020 1006   PROT 8.4 (H) 06/24/2011 1709   ALBUMIN 4.3 02/08/2020 1006   ALBUMIN 4.1 06/24/2011 1709   AST 22 02/08/2020 1006   AST 23 06/24/2011 1709   ALT 16 02/08/2020 1006   ALT 18 06/24/2011 1709   ALKPHOS 66 02/08/2020 1006   ALKPHOS 99 06/24/2011 1709   BILITOT 1.0 02/08/2020 1006   BILITOT 0.4 06/24/2011 1709   GFRNONAA >60 02/08/2020 1006   GFRNONAA >60 06/24/2011 1709   GFRAA >60 07/28/2019 1048   GFRAA >60 06/24/2011 1709    Imaging Studies: No results found.  Assessment and Plan:   Connie Cameron is a 59 y.o. y/o female has been referred for weight loss  Assessment and Plan: Given the patient has never had a colonoscopy and is well beyond her screening age, and is now also having weight loss, colonoscopy indicated for screening and evaluation of any underlying lesions  Patient is also reporting dysphagia and EGD would help rule out any underlying lesions, radiation-induced strictures etc.  I have discussed alternative options, risks & benefits,  which include, but are not limited to, bleeding, infection, perforation,respiratory complication & drug reaction.  The patient agrees with this plan & written consent will be obtained.     Follow Up Instructions:   I discussed the assessment and treatment plan with the patient. The patient was provided an opportunity to ask questions and all were answered. The patient agreed with the plan and demonstrated an understanding of the instructions.   The patient was advised to call back or seek an in-person evaluation if the symptoms worsen or if the condition fails to improve as anticipated.  I provided 15 minutes of non-face-to-face time during  this encounter.   Virgel Manifold, MD  Speech recognition software was used to dictate the above note.

## 2020-02-29 ENCOUNTER — Other Ambulatory Visit: Payer: Self-pay

## 2020-02-29 ENCOUNTER — Other Ambulatory Visit
Admission: RE | Admit: 2020-02-29 | Discharge: 2020-02-29 | Disposition: A | Discharge status: Home / Self Care | Payer: Medicaid Other | Source: Ambulatory Visit | Attending: Gastroenterology | Admitting: Gastroenterology

## 2020-02-29 DIAGNOSIS — U071 COVID-19: Secondary | ICD-10-CM | POA: Diagnosis not present

## 2020-02-29 DIAGNOSIS — Z01812 Encounter for preprocedural laboratory examination: Secondary | ICD-10-CM | POA: Insufficient documentation

## 2020-02-29 LAB — SARS CORONAVIRUS 2 (TAT 6-24 HRS): SARS Coronavirus 2: POSITIVE — AB

## 2020-03-01 ENCOUNTER — Telehealth: Payer: Self-pay | Admitting: Gastroenterology

## 2020-03-01 ENCOUNTER — Telehealth: Payer: Self-pay | Admitting: *Deleted

## 2020-03-01 NOTE — Telephone Encounter (Signed)
Patient tested positive for Covid today, needs to reschedule procedure scheduled for 03/02/20.

## 2020-03-01 NOTE — Telephone Encounter (Signed)
Called to discuss with patient about COVID-19 symptoms and the use of one of the available treatments for those with mild to moderate Covid symptoms and at a high risk of hospitalization.  Pt appears to qualify for outpatient treatment due to co-morbid conditions and/or a member of an at-risk group in accordance with the FDA Emergency Use Authorization.  Patient has not had any symptoms. She showed up yesterday for procedure screening and came back positive today. Reviewed isolation directions with/without symptoms. Drink plenty to stay hydrated. If you should notice wheezing/chest tightness call your pcp or seek immediate treatment at the UC/ED. This was the 1st call explaining she is Covid positive. She will call the office where her procedure is schedule for more information regarding cancellation of procedure.       Connie Cameron

## 2020-03-02 ENCOUNTER — Encounter: Admission: RE | Payer: Self-pay | Source: Home / Self Care

## 2020-03-02 ENCOUNTER — Ambulatory Visit: Admission: RE | Admit: 2020-03-02 | Payer: Medicaid Other | Source: Home / Self Care | Admitting: Gastroenterology

## 2020-03-02 SURGERY — ESOPHAGOGASTRODUODENOSCOPY (EGD) WITH PROPOFOL
Anesthesia: General

## 2020-03-02 NOTE — Telephone Encounter (Signed)
Called patient and had to leave her a voicemail letting her know that I was contacting her to reschedule her procedure.

## 2020-03-08 ENCOUNTER — Other Ambulatory Visit: Payer: Self-pay

## 2020-03-08 DIAGNOSIS — R1319 Other dysphagia: Secondary | ICD-10-CM

## 2020-03-08 DIAGNOSIS — Z1211 Encounter for screening for malignant neoplasm of colon: Secondary | ICD-10-CM

## 2020-03-08 DIAGNOSIS — R634 Abnormal weight loss: Secondary | ICD-10-CM

## 2020-03-28 ENCOUNTER — Other Ambulatory Visit: Payer: Medicaid Other | Attending: Gastroenterology

## 2020-03-30 ENCOUNTER — Ambulatory Visit: Payer: Medicaid Other | Admitting: Anesthesiology

## 2020-03-30 ENCOUNTER — Encounter: Payer: Self-pay | Admitting: Gastroenterology

## 2020-03-30 ENCOUNTER — Encounter
Admission: RE | Disposition: A | Discharge status: Home / Self Care | Payer: Self-pay | Source: Home / Self Care | Attending: Gastroenterology

## 2020-03-30 ENCOUNTER — Ambulatory Visit
Admission: RE | Admit: 2020-03-30 | Discharge: 2020-03-30 | Disposition: A | Discharge status: Home / Self Care | Payer: Medicaid Other | Attending: Gastroenterology | Admitting: Gastroenterology

## 2020-03-30 DIAGNOSIS — K2950 Unspecified chronic gastritis without bleeding: Secondary | ICD-10-CM | POA: Insufficient documentation

## 2020-03-30 DIAGNOSIS — R1319 Other dysphagia: Secondary | ICD-10-CM

## 2020-03-30 DIAGNOSIS — K209 Esophagitis, unspecified without bleeding: Secondary | ICD-10-CM | POA: Diagnosis not present

## 2020-03-30 DIAGNOSIS — K2289 Other specified disease of esophagus: Secondary | ICD-10-CM | POA: Insufficient documentation

## 2020-03-30 DIAGNOSIS — R634 Abnormal weight loss: Secondary | ICD-10-CM

## 2020-03-30 DIAGNOSIS — K200 Eosinophilic esophagitis: Secondary | ICD-10-CM | POA: Diagnosis not present

## 2020-03-30 DIAGNOSIS — K648 Other hemorrhoids: Secondary | ICD-10-CM | POA: Diagnosis not present

## 2020-03-30 DIAGNOSIS — Z808 Family history of malignant neoplasm of other organs or systems: Secondary | ICD-10-CM | POA: Insufficient documentation

## 2020-03-30 DIAGNOSIS — Z803 Family history of malignant neoplasm of breast: Secondary | ICD-10-CM | POA: Insufficient documentation

## 2020-03-30 DIAGNOSIS — R1310 Dysphagia, unspecified: Secondary | ICD-10-CM | POA: Diagnosis not present

## 2020-03-30 DIAGNOSIS — D130 Benign neoplasm of esophagus: Secondary | ICD-10-CM | POA: Diagnosis not present

## 2020-03-30 DIAGNOSIS — K5730 Diverticulosis of large intestine without perforation or abscess without bleeding: Secondary | ICD-10-CM | POA: Diagnosis not present

## 2020-03-30 DIAGNOSIS — K222 Esophageal obstruction: Secondary | ICD-10-CM

## 2020-03-30 DIAGNOSIS — Z8512 Personal history of malignant neoplasm of trachea: Secondary | ICD-10-CM | POA: Insufficient documentation

## 2020-03-30 DIAGNOSIS — Z1211 Encounter for screening for malignant neoplasm of colon: Secondary | ICD-10-CM | POA: Insufficient documentation

## 2020-03-30 HISTORY — PX: ESOPHAGOGASTRODUODENOSCOPY (EGD) WITH PROPOFOL: SHX5813

## 2020-03-30 HISTORY — PX: COLONOSCOPY WITH PROPOFOL: SHX5780

## 2020-03-30 SURGERY — COLONOSCOPY WITH PROPOFOL
Anesthesia: General

## 2020-03-30 MED ORDER — PROPOFOL 500 MG/50ML IV EMUL
INTRAVENOUS | Status: DC | PRN
  Administered 2020-03-30: 200 ug/kg/min via INTRAVENOUS

## 2020-03-30 MED ORDER — MIDAZOLAM HCL 2 MG/2ML IJ SOLN
INTRAMUSCULAR | Status: DC | PRN
  Administered 2020-03-30: 2 mg via INTRAVENOUS

## 2020-03-30 MED ORDER — OMEPRAZOLE 20 MG PO CPDR: 20.0000 mg | DELAYED_RELEASE_CAPSULE | Freq: Every day | ORAL | 0 refills | Status: DC

## 2020-03-30 MED ORDER — MIDAZOLAM HCL 2 MG/2ML IJ SOLN
INTRAMUSCULAR | Status: AC
  Filled 2020-03-30: qty 2

## 2020-03-30 MED ORDER — LIDOCAINE HCL (PF) 2 % IJ SOLN
INTRAMUSCULAR | Status: DC | PRN
  Administered 2020-03-30: 100 mg via INTRADERMAL

## 2020-03-30 MED ORDER — PROPOFOL 10 MG/ML IV BOLUS
INTRAVENOUS | Status: DC | PRN
  Administered 2020-03-30: 60 mg via INTRAVENOUS

## 2020-03-30 MED ORDER — SODIUM CHLORIDE 0.9 % IV SOLN: INTRAVENOUS | Status: DC

## 2020-03-30 NOTE — Transfer of Care (Signed)
Immediate Anesthesia Transfer of Care Note  Patient: Connie Cameron  Procedure(s) Performed: COLONOSCOPY WITH PROPOFOL (N/A ) ESOPHAGOGASTRODUODENOSCOPY (EGD) WITH PROPOFOL (N/A )  Patient Location: PACU  Anesthesia Type:General  Level of Consciousness: awake and sedated  Airway & Oxygen Therapy: Patient Spontanous Breathing and Patient connected to nasal cannula oxygen  Post-op Assessment: Report given to RN and Post -op Vital signs reviewed and stable  Post vital signs: Reviewed and stable  Last Vitals:  Vitals Value Taken Time  BP 129/89 03/30/20 1059  Temp    Pulse 88 03/30/20 1059  Resp 16 03/30/20 1059  SpO2 100 % 03/30/20 1059  Vitals shown include unvalidated device data.  Last Pain:  Vitals:   03/30/20 0853  TempSrc: Temporal  PainSc: 0-No pain         Complications: No complications documented.

## 2020-03-30 NOTE — Anesthesia Postprocedure Evaluation (Signed)
Anesthesia Post Note  Patient: Connie Cameron  Procedure(s) Performed: COLONOSCOPY WITH PROPOFOL (N/A ) ESOPHAGOGASTRODUODENOSCOPY (EGD) WITH PROPOFOL (N/A )  Patient location during evaluation: Endoscopy Anesthesia Type: General Level of consciousness: awake and alert Pain management: pain level controlled Vital Signs Assessment: post-procedure vital signs reviewed and stable Respiratory status: spontaneous breathing, nonlabored ventilation and respiratory function stable Cardiovascular status: blood pressure returned to baseline and stable Postop Assessment: no apparent nausea or vomiting Anesthetic complications: no   No complications documented.   Last Vitals:  Vitals:   03/30/20 1110 03/30/20 1120  BP: (!) 136/98 (!) 148/95  Pulse: 88 80  Resp: 16 12  Temp: (!) 35.6 C (!) 35.9 C  SpO2: 100% 100%    Last Pain:  Vitals:   03/30/20 1120  TempSrc: Temporal  PainSc: 0-No pain                 Alphonsus Sias

## 2020-03-30 NOTE — Op Note (Signed)
[facility] Patient Name: Bryna Razavi Procedure Date: 03/30/2020 9:44 AM MRN: [id number] Account #: [id number] Date of Birth: 10/14/1960 Admit Type: Outpatient Age: 60 Room: [facility] ENDO ROOM 3 Gender: Female Note Status: Finalized Procedure:             Upper GI endoscopy Indications:           Dysphagia Providers:             Johntay Doolen B. Bonna Gains MD, MD Referring MD:          Forest Gleason Md, MD (Referring MD) Medicines:             Monitored Anesthesia Care Complications:         No immediate complications. Procedure:             Pre-Anesthesia Assessment:                        - Prior to the procedure, a History and Physical was                         performed, and patient medications, allergies and                         sensitivities were reviewed. The patient's tolerance                         of previous anesthesia was reviewed.                        - The risks and benefits of the procedure and the                         sedation options and risks were discussed with the                         patient. All questions were answered and informed                         consent was obtained.                        - Patient identification and proposed procedure were                         verified prior to the procedure by the physician, the                         nurse, the anesthesiologist, the anesthetist and the                         technician. The procedure was verified in the                         procedure room.                        - ASA Grade Assessment: II - A patient with mild                         systemic disease.  After obtaining informed consent, the endoscope was                         passed under direct vision. Throughout the procedure,                         the patient's blood pressure, pulse, and oxygen                         saturations were monitored continuously. The Endoscope                          was introduced through the mouth, and advanced to the                         second part of duodenum. The upper GI endoscopy was                         accomplished with ease. The patient tolerated the                         procedure well. Findings:      Mucosal changes including circumferential folds were found in the distal       esophagus. Biopsies were obtained from the proximal and distal esophagus       with cold forceps for histology of suspected eosinophilic esophagitis.      A mild Schatzki ring was found at the gastroesophageal junction. A TTS       dilator was passed through the scope. Dilation with an 18-19-20 mm       balloon dilator was performed to 20 mm. The dilation site was examined       and showed complete resolution of luminal narrowing. Heme effect was not       noted after dilation to 18 and 28mm. Good and expected heme effect noted       only after 20 mm.      White nummular lesions were noted in the distal esophagus. Biopsies were       taken with a cold forceps for histology.      A single 4 mm mucosal nodule with a localized distribution was found in       the proximal esophagus, 20 cm from the incisors. Biopsies were taken       with a cold forceps for histology.      The exam of the esophagus was otherwise normal.      The entire examined stomach was normal. Biopsies were obtained in the       gastric body, at the incisura and in the gastric antrum with cold       forceps for histology. Biopsies were taken with a cold forceps for       Helicobacter pylori testing.      The duodenal bulb, second portion of the duodenum and examined duodenum       were normal. Impression:            - Esophageal mucosal changes suggestive of                         eosinophilic esophagitis. Biopsied.                        -  Mild Schatzki ring. Dilated.                        - White nummular lesions in esophageal mucosa.                          Biopsied.                        - Mucosal nodule found in the esophagus. Biopsied.                        - Normal stomach. Biopsied.                        - Normal duodenal bulb, second portion of the duodenum                         and examined duodenum.                        - Biopsies were obtained in the gastric body, at the                         incisura and in the gastric antrum. Recommendation:        - Await pathology results.                        - Discharge patient to home (with escort).                        - Advance diet as tolerated.                        - Continue present medications.                        - Patient has a contact number available for                         emergencies. The signs and symptoms of potential                         delayed complications were discussed with the patient.                         Return to normal activities tomorrow. Written                         discharge instructions were provided to the patient.                        - Discharge patient to home (with escort).                        - The findings and recommendations were discussed with                         the patient.                        -  The findings and recommendations were discussed with                         the patient's family. Procedure Code(s):     --- Professional ---                        [phone], Esophagogastroduodenoscopy, flexible,                         transoral; with transendoscopic balloon dilation of                         esophagus (less than 30 mm diameter)                        43239, 59, Esophagogastroduodenoscopy, flexible,                         transoral; with biopsy, single or multiple Diagnosis Code(s):     --- Professional ---                        K22.8, Other specified diseases of esophagus                        K22.2, Esophageal obstruction                        R13.10, Dysphagia, unspecified CPT copyright 2019  American Medical Association. All rights reserved. The codes documented in this report are preliminary and upon coder review may  be revised to meet current compliance requirements.  [name], MD Margretta Sidle B. Bonna Gains MD, MD 03/30/2020 10:31:35 AM This report has been signed electronically. Number of Addenda: 0 Note Initiated On: 03/30/2020 9:44 AM Estimated Blood Loss:  Estimated blood loss: none.      [facility]

## 2020-03-30 NOTE — H&P (Signed)
Connie Antigua, MD [address] [place], [place], [place], [address] [address], [place], [place], [place], [address] Phone: [phone]  Fax: [phone]  Primary Care Physician:  Patient, No Pcp Per   Pre-Procedure History & Physical: HPI:  Connie Cameron is a 60 y.o. female is here for a colonoscopy and EGD.   Past Medical History:  Diagnosis Date  . 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 EXCISION MASS NECK Left 01/18/2019   Procedure: EXCISION MASS NECK/NODE;  Surgeon: Margaretha Sheffield, MD;  Location: [facility] ORS;  Service: ENT;  Laterality: Left;  . LARYNGOSCOPY Bilateral 02/22/2019   Procedure: MICROSCOPIC DIRECT LARYNGOSCOPY AND BIOPSY;  Surgeon: Margaretha Sheffield, MD;  Location: [facility] ORS;  Service: ENT;  Laterality: Bilateral;  . PORTA CATH INSERTION N/A 03/24/2019   Procedure: PORTA CATH INSERTION;  Surgeon: Katha Cabal, MD;  Location: [place] [facility];  Service: Cardiovascular;  Laterality: N/A;  . TONSILLECTOMY    . TUBAL LIGATION      Prior to Admission medications   Medication Sig Start Date End Date Taking? Authorizing Provider  Na Sulfate-K Sulfate-Mg Sulf 17.5-3.13-1.6 GM/177ML SOLN At 5 PM the day before procedure take 1 bottle and 5 hours before procedure take 1 bottle. 02/23/20   Virgel Manifold, MD    Allergies as of 03/08/2020  . (No Known Allergies)    Family History  Problem Relation Age of Onset  . Breast cancer Sister   . Brain cancer Paternal Grandfather     Social History   Socioeconomic History  . Marital status: Single    Spouse name: Not on file  . Number of children: Not on file  . Years of education: Not on file  . Highest education level: Not on file  Occupational History  . Not on file  Tobacco Use  . Smoking status: Never Smoker  . Smokeless tobacco: Never Used  Vaping Use  . Vaping Use: Never used  Substance and Sexual Activity  . Alcohol use: Not Currently  .  Drug use: No  . Sexual activity: Not on file  Other Topics Concern  . Not on file  Social History Narrative  . Not on file   Social Determinants of Health   Financial Resource Strain: Not on file  Food Insecurity: Not on file  Transportation Needs: Not on file  Physical Activity: Not on file  Stress: Not on file  Social Connections: Not on file  Intimate Partner Violence: Not on file    Review of Systems: See HPI, otherwise negative ROS  Physical Exam: BP (!) 144/102   Pulse 78   Temp (!) 97.1 F (36.2 C) (Temporal)   Resp 17   Ht 5\' 3"  (1.6 m)   Wt 58.1 kg   SpO2 100%   BMI 22.67 kg/m  General:   Alert,  pleasant and cooperative in NAD Head:  Normocephalic and atraumatic. Neck:  Supple; no masses or thyromegaly. Lungs:  Clear throughout to auscultation, normal respiratory effort.    Heart:  +S1, +S2, Regular rate and rhythm, No edema. Abdomen:  Soft, nontender and nondistended. Normal bowel sounds, without guarding, and without rebound.   Neurologic:  Alert and  oriented x4;  grossly normal neurologically.  Impression/Plan: Connie Cameron is here for a colonoscopy to be performed for average risk screening and EGD for dysphagia  Risks, benefits, limitations, and alternatives regarding the procedures have been reviewed with the patient.  Questions have been answered.  All parties agreeable.   Virgel Manifold, MD  03/30/2020, 10:01 AM

## 2020-03-30 NOTE — Anesthesia Preprocedure Evaluation (Addendum)
Anesthesia Evaluation  Patient identified by MRN, date of birth, ID band Patient awake    Reviewed: Allergy & Precautions, H&P , NPO status , reviewed documented beta blocker date and time   Airway Mallampati: II   Neck ROM: full    Dental  (+) Caps, Teeth Intact   Pulmonary    Pulmonary exam normal        Cardiovascular Normal cardiovascular exam     Neuro/Psych    GI/Hepatic neg GERD  ,  Endo/Other    Renal/GU      Musculoskeletal   Abdominal   Peds  Hematology  (+) Blood dyscrasia, anemia ,   Anesthesia Other Findings Past Medical History: 2021: Throat cancer [facility])  Past Surgical History: 01/18/2019: EXCISION MASS NECK; Left     Comment:  Procedure: EXCISION MASS NECK/NODE;  Surgeon: Margaretha Sheffield, MD;  Location: [facility] ORS;  Service: ENT;                Laterality: Left; 02/22/2019: LARYNGOSCOPY; Bilateral     Comment:  Procedure: MICROSCOPIC DIRECT LARYNGOSCOPY AND BIOPSY;                Surgeon: Margaretha Sheffield, MD;  Location: [facility] ORS;                Service: ENT;  Laterality: Bilateral; 03/24/2019: PORTA CATH INSERTION; N/A     Comment:  Procedure: PORTA CATH INSERTION;  Surgeon: Katha Cabal, MD;  Location: [place] [facility];  Service:              Cardiovascular;  Laterality: N/A; No date: TONSILLECTOMY No date: TUBAL LIGATION  BMI    Body Mass Index: 22.67 kg/m      Reproductive/Obstetrics                            Anesthesia Physical Anesthesia Plan  ASA: II  Anesthesia Plan: General   Post-op Pain Management:    Induction: Intravenous  PONV Risk Score and Plan: Treatment may vary due to age or medical condition and TIVA  Airway Management Planned: Nasal Cannula and Natural Airway  Additional Equipment:   Intra-op Plan:   Post-operative Plan:   Informed Consent: I have reviewed the patients History and Physical,  chart, labs and discussed the procedure including the risks, benefits and alternatives for the proposed anesthesia with the patient or authorized representative who has indicated his/her understanding and acceptance.     Dental Advisory Given  Plan Discussed with: CRNA  Anesthesia Plan Comments:         Anesthesia Quick Evaluation

## 2020-03-30 NOTE — Op Note (Signed)
[facility] Patient Name: Kynnadi Dicenso Procedure Date: 03/30/2020 9:43 AM MRN: [id number] Account #: [id number] Date of Birth: 25-Jul-1960 Admit Type: Outpatient Age: 60 Room: [facility] ENDO ROOM 3 Gender: Female Note Status: Finalized Procedure:             Colonoscopy Indications:           Screening for colorectal malignant neoplasm Providers:             Breauna Mazzeo B. Bonna Gains MD, MD Referring MD:          Forest Gleason Md, MD (Referring MD) Medicines:             Monitored Anesthesia Care Complications:         No immediate complications. Procedure:             Pre-Anesthesia Assessment:                        - Prior to the procedure, a History and Physical was                         performed, and patient medications, allergies and                         sensitivities were reviewed. The patient's tolerance                         of previous anesthesia was reviewed.                        - The risks and benefits of the procedure and the                         sedation options and risks were discussed with the                         patient. All questions were answered and informed                         consent was obtained.                        - Patient identification and proposed procedure were                         verified prior to the procedure by the physician, the                         nurse, the anesthetist and the technician. The                         procedure was verified in the pre-procedure area in                         the procedure room in the endoscopy suite.                        - ASA Grade Assessment: II - A patient with mild  systemic disease.                        - After reviewing the risks and benefits, the patient                         was deemed in satisfactory condition to undergo the                         procedure.                        After obtaining informed consent, the  colonoscope was                         passed under direct vision. Throughout the procedure,                         the patient's blood pressure, pulse, and oxygen                         saturations were monitored continuously. The                         Colonoscope was introduced through the anus and                         advanced to the the cecum, identified by appendiceal                         orifice and ileocecal valve. The colonoscopy was                         performed with ease. The patient tolerated the                         procedure well. The quality of the bowel preparation                         was good. Findings:      The perianal and digital rectal examinations were normal.      A few diverticula were found in the sigmoid colon.      The exam was otherwise without abnormality.      The rectum, sigmoid colon, descending colon, transverse colon, ascending       colon and cecum appeared normal.      Non-bleeding internal hemorrhoids were found during retroflexion.      No additional abnormalities were found on retroflexion. Impression:            - Diverticulosis in the sigmoid colon.                        - The examination was otherwise normal.                        - The rectum, sigmoid colon, descending colon,                         transverse colon, ascending colon and cecum are normal.                        -  Non-bleeding internal hemorrhoids.                        - No specimens collected. Recommendation:        - Discharge patient to home.                        - Resume previous diet.                        - Continue present medications.                        - Repeat colonoscopy in 10 years for screening                         purposes.                        - Return to primary care physician as previously                         scheduled.                        - The findings and recommendations were discussed with                          the patient.                        - The findings and recommendations were discussed with                         the patient's family.                        - High fiber diet.                        - In the future, if patient develops new symptoms such                         as blood per rectum, abdominal pain, weight loss,                         altered bowel habits or any other reason for concern,                         patient should discuss this with thier PCP as they may                         need a GI referral at that time or evaluation for need                         for colonoscopy earlier than the recommended screening                         colonoscopy.                        In addition,  if patient's family history of colon                         cancer changes (no family history at this time) in the                         future, earlier screening may be indicated and patient                         should discuss this with PCP as well. Procedure Code(s):     --- Professional ---                        [phone], Colonoscopy, flexible; diagnostic, including                         collection of specimen(s) by brushing or washing, when                         performed (separate procedure) Diagnosis Code(s):     --- Professional ---                        Z12.11, Encounter for screening for malignant neoplasm                         of colon CPT copyright 2019 American Medical Association. All rights reserved. The codes documented in this report are preliminary and upon coder review may  be revised to meet current compliance requirements.  [name], MD Margretta Sidle B. Bonna Gains MD, MD 03/30/2020 10:58:42 AM This report has been signed electronically. Number of Addenda: 0 Note Initiated On: 03/30/2020 9:43 AM Scope Withdrawal Time: 0 hours 14 minutes 36 seconds  Total Procedure Duration: 0 hours 22 minutes 0 seconds       [facility]

## 2020-03-30 NOTE — Anesthesia Procedure Notes (Signed)
Date/Time: 03/30/2020 10:02 AM Performed by: Nelda Marseille, CRNA Pre-anesthesia Checklist: Patient identified, Emergency Drugs available, Suction available, Patient being monitored and Timeout performed

## 2020-03-31 ENCOUNTER — Encounter: Payer: Self-pay | Admitting: Gastroenterology

## 2020-04-03 LAB — SURGICAL PATHOLOGY

## 2020-04-05 ENCOUNTER — Other Ambulatory Visit: Payer: Self-pay | Admitting: Gastroenterology

## 2020-04-05 DIAGNOSIS — K2900 Acute gastritis without bleeding: Secondary | ICD-10-CM

## 2020-04-24 ENCOUNTER — Telehealth: Payer: Self-pay

## 2020-04-24 ENCOUNTER — Telehealth: Payer: Self-pay | Admitting: Hematology and Oncology

## 2020-04-24 ENCOUNTER — Telehealth: Payer: Self-pay | Admitting: *Deleted

## 2020-04-24 NOTE — Telephone Encounter (Signed)
Patient was informed to come in to our office to have blood drawn to check and see if she had H Pylori due to her having gastritis. Patient was also informed that Dr. Bonna Gains had seen some benign lesions in her esophagus, therefore, she recommended a repeat EGD in 3-6 months for removal. Patient was also told that she will need to see Dr. Bonna Gains in 3 months for a follow up. Patient agreed.

## 2020-04-24 NOTE — Telephone Encounter (Signed)
Called pt today per her request she wanted to know the pathology of gi procedure shehad in feb. Also dr Mike Gip wanted her to have CT scan and it was never scheduled. Our scheduling team says that it takes 7 days to get authorization so it is being worked on and we will call her with date and time for CT and then make an appt to see md. Also we are trying to make mammogram appt but the pt would need to tell us when the last mammogram she had and where it was done and how long ago we will need to get the films from the last one she had to compare to the the one we will schedule her for. Her GI procedure showed some esophagitis, and chronic gastritis. I left all of this info on the message voicemail. Asked her to call me and also suggested that she should call Gi about the results because there was no maliginancy but she should ask GI if there is anything to do about the esophagitis and gastritis

## 2020-04-24 NOTE — Telephone Encounter (Signed)
Patient called requesting biopsy results from February.  She is due a port flush in April.  I told her we would schedule the port flush once we determined when Dr. Mike Gip wanted to see her in clinic.  Routing to clinical team for follow up.

## 2020-04-24 NOTE — Telephone Encounter (Signed)
-----   Message from Virgel Manifold, MD sent at 04/05/2020  1:42 PM EST ----- Herb Grays please let the patient know, her biopsies from her stomach showed gastritis.  I have ordered H. pylori blood test to confirm that there is no H. pylori that is causing the gastritis.  Also a benign lesion was seen in her esophagus and the biopsies are benign.  However, we will need to repeat upper endoscopy to remove this in the future.  Repeat upper endoscopy in 3 to 6 months.  Please ensure patient has follow-up with me in 3 months

## 2020-04-24 NOTE — Telephone Encounter (Signed)
I have called the pt and put note in for this pt.

## 2020-04-24 NOTE — Telephone Encounter (Signed)
   Please let patient know about biopsy results.    Dr Bonna Gains reviewed these results with her and the need for future testing.   M   DIAGNOSIS:  A.  STOMACH; COLD BIOPSY:  - MODERATE CHRONIC ACTIVE GASTRITIS.  - IHC FOR H.PYLORI IS NEGATIVE, SEE COMMENT.  - NEGATIVE FOR DYSPLASIA AND MALIGNANCY.   B ESOPHAGUS; COLD BIOPSY:  - FRAGMENTS OF SQUAMOUS MUCOSA WITH PATCHY GLYCOGEN ACANTHOSIS AND FOCAL  MILD ACUTE ESOPHAGITIS.  - PASDF STAIN IS NEGATIVE FOR YEAST / FUNGAL HYPHAE; STAIN CONTROLS  WORKED APPROPRIATELY.  - FRAGMENTS OF GASTRIC CARDIAC TYPE MUCOSA WITH MODERATE CHRONIC ACTIVE  GASTRITIS.  - NEGATIVE FOR INTRAEPITHELIAL EOSINOPHILS, DYSPLASIA, AND MALIGNANCY.   C.  ESOPHAGUS, 20 CM; COLD BIOPSY:  - BENIGN SQUAMOUS PAPILLOMA.  - NEGATIVE FOR DYSPLASIA AND MALIGNANCY.

## 2020-04-25 ENCOUNTER — Other Ambulatory Visit: Payer: Self-pay | Admitting: *Deleted

## 2020-04-25 DIAGNOSIS — C01 Malignant neoplasm of base of tongue: Secondary | ICD-10-CM

## 2020-04-25 DIAGNOSIS — R634 Abnormal weight loss: Secondary | ICD-10-CM

## 2020-04-27 LAB — H PYLORI, IGM, IGG, IGA AB
H pylori, IgM Abs: <9 units (ref 0.0–8.9)
H. pylori, IgA Abs: <9 units (ref 0.0–8.9)
H. pylori, IgG AbS: 1.25 Index Value — ABNORMAL HIGH (ref 0.00–0.79)

## 2020-05-01 ENCOUNTER — Telehealth: Payer: Self-pay | Admitting: Gastroenterology

## 2020-05-01 NOTE — Telephone Encounter (Signed)
Patient called Connie Cameron to call back with results from her blood test.

## 2020-05-02 NOTE — Telephone Encounter (Signed)
Can you please review so I could call patient back. Thank you.

## 2020-05-03 NOTE — Telephone Encounter (Signed)
-----   Message from Virgel Manifold, MD sent at 05/02/2020  3:44 PM EDT ----- Herb Grays please let the patient know, her blood work showed evidence of previous infection with H. pylori, but not current infection.  Therefore, she does not need any treatment for this.  She will need repeat upper endoscopy in 2 to 3 months for removal of the benign esophageal lesion.  If she is willing to schedule, she can put her on the schedule. Please ensure she has clinic follow-up in 3 months.

## 2020-05-03 NOTE — Telephone Encounter (Signed)
Called patient back but had to leave her a voicemail asking to call me back so I could go over her lab results and Dr. Michele Mcalpine recommendations.

## 2020-05-04 ENCOUNTER — Other Ambulatory Visit: Payer: Self-pay

## 2020-05-04 DIAGNOSIS — K229 Disease of esophagus, unspecified: Secondary | ICD-10-CM

## 2020-05-11 ENCOUNTER — Ambulatory Visit
Admission: RE | Admit: 2020-05-11 | Discharge: 2020-05-11 | Disposition: A | Discharge status: Home / Self Care | Payer: Medicaid Other | Source: Ambulatory Visit | Attending: Hematology and Oncology | Admitting: Hematology and Oncology

## 2020-05-11 ENCOUNTER — Other Ambulatory Visit: Payer: Self-pay

## 2020-05-11 DIAGNOSIS — C01 Malignant neoplasm of base of tongue: Secondary | ICD-10-CM | POA: Diagnosis present

## 2020-05-11 DIAGNOSIS — R634 Abnormal weight loss: Secondary | ICD-10-CM | POA: Diagnosis present

## 2020-05-11 LAB — POCT I-STAT CREATININE: Creatinine, Ser: 0.9 mg/dL (ref 0.44–1.00)

## 2020-05-11 MED ORDER — IOHEXOL 300 MG/ML  SOLN
100.0000 mL | Freq: Once | INTRAMUSCULAR | Status: AC | PRN
  Administered 2020-05-11: 100 mL via INTRAVENOUS

## 2020-05-11 NOTE — Progress Notes (Incomplete)
[facility]  [address], [address] [place], [place] [address] Phone: [phone]  Fax: [phone]   Clinic Day:  05/11/2020  Referring physician: No ref. provider found  Chief Complaint: Connie Cameron is a 61 y.o. 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  HPI: The patient was last seen in the medical oncology clinic on 02/08/2020.  At that time, she had lost 19 pounds unintentionally since 06/2019. She denied any nausea, vomiting or diarrhea. She was eating well. She described an isolated episode of mid chest discomfort and transient left facial numbness. Neurologic exam was normal. Hematocrit was 33.2, hemoglobin 11.1, MCV 89.7, platelets 213,000, WBC 4,300. CMP was normal. TSH was 3.906 and free T4 0.92 (normal).  Colonoscopy on 03/30/2020 by Dr. Bonna Gains revealed diverticulosis in the sigmoid colon. EGD showed esophageal mucosal changes suggestive of eosinophilic esophagitis. There was a mild Schatzki ring. There were white nummular lesions in esophageal mucosa. A mucosal nodule was found in the esophagus (benign squamous papilloma). Pathology showed moderate chronic active gastritis.  Chest, abdomen, and pelvis CT with contrast on 05/11/2020 revealed no acute findings within the chest, abdomen, or pelvis.  There were no findings to suggest metastatic disease within the chest, abdomen, or pelvis.  There was LAD atherosclerotic calcifications and aortic atherosclerosis.  During the interim, ***   Past Medical History:  Diagnosis Date  . 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 COLONOSCOPY WITH PROPOFOL N/A 03/30/2020   Procedure: COLONOSCOPY WITH PROPOFOL;  Surgeon: Virgel Manifold, MD;  Location: [facility] ENDOSCOPY;  Service: Endoscopy;  Laterality: N/A;  COVID  POSITIVE 02/29/2020  . ESOPHAGOGASTRODUODENOSCOPY (EGD) WITH PROPOFOL N/A 03/30/2020   Procedure: ESOPHAGOGASTRODUODENOSCOPY (EGD) WITH PROPOFOL;  Surgeon: Virgel Manifold, MD;  Location: [facility] ENDOSCOPY;  Service: Endoscopy;  Laterality: N/A;  . EXCISION MASS NECK Left 01/18/2019   Procedure: EXCISION MASS NECK/NODE;  Surgeon: Margaretha Sheffield, MD;  Location: [facility] ORS;  Service: ENT;  Laterality: Left;  . LARYNGOSCOPY Bilateral 02/22/2019   Procedure: MICROSCOPIC DIRECT LARYNGOSCOPY AND BIOPSY;  Surgeon: Margaretha Sheffield, MD;  Location: [facility] ORS;  Service: ENT;  Laterality: Bilateral;  . PORTA CATH INSERTION N/A 03/24/2019   Procedure: PORTA CATH INSERTION;  Surgeon: Katha Cabal, MD;  Location: [facility];  Service: Cardiovascular;  Laterality: N/A;  . TONSILLECTOMY    . TUBAL LIGATION      Family History  Problem Relation Age of Onset  . Breast cancer Sister   . Brain cancer Paternal Grandfather     Social History:  reports that she has never smoked. She has never used smokeless tobacco. She reports previous alcohol use. She reports that she does not use drugs. She use to drink years ago. She denies any exposure to radiation or toxins. 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 She lives in [place]. The patient is alone*** today.  Allergies: No Known Allergies  Current Medications: Current Outpatient Medications  Medication Sig Dispense Refill  . Na Sulfate-K Sulfate-Mg Sulf 17.5-3.13-1.6 GM/177ML SOLN At 5 PM the day before procedure take 1 bottle and 5 hours before procedure take 1 bottle. 354 mL 0  . omeprazole (PRILOSEC) 20 MG capsule Take 1 capsule (20 mg total) by mouth daily. 30 capsule 0   No current  facility-administered medications for this visit.    Review of Systems  Constitutional: Positive for weight loss (9 lbs since last visit; 25 pounds since 06/30/2019). Negative for chills, diaphoresis, fever and malaise/fatigue.   HENT: Positive for hearing loss (left ear). Negative for congestion, ear discharge, ear pain, nosebleeds, sinus pain, sore throat and tinnitus.        Hears "chirping."  Eyes: Negative.  Negative for blurred vision, double vision and photophobia.  Respiratory: Negative.  Negative for cough, hemoptysis, sputum production and shortness of breath.   Cardiovascular: Positive for chest pain (discomfort). Negative for palpitations and leg swelling.  Gastrointestinal: Negative for abdominal pain, blood in stool, constipation, diarrhea, heartburn, melena, nausea and vomiting.       Eating well.  Genitourinary: Negative for dysuria, frequency and urgency.  Musculoskeletal: Negative.  Negative for back pain, falls, joint pain and myalgias.  Skin: Negative for itching and rash.  Neurological: Positive for dizziness (with chest pain). Negative for tingling, sensory change, speech change, focal weakness, weakness and headaches.       Episode of numbness on left side of face.  Endo/Heme/Allergies: Negative.  Negative for environmental allergies. Does not bruise/bleed easily.  Psychiatric/Behavioral: Negative.  Negative for depression and memory loss. The patient is not nervous/anxious and does not have insomnia.   All other systems reviewed and are negative.  Performance status (ECOG): 1***  Vitals There were no vitals taken for this visit.   Physical Exam Vitals and nursing note reviewed.  Constitutional:      General: She is not in acute distress.    Appearance: She is well-developed. She is not diaphoretic.     Interventions: Face mask in place.  HENT:     Head: Normocephalic and atraumatic.     Comments: Long blonde hair.    Mouth/Throat:     Mouth: No oral lesions.     Pharynx: Oropharynx is clear. No oropharyngeal exudate.  Eyes:     General: No scleral icterus.    Extraocular Movements: Extraocular movements intact.     Conjunctiva/sclera: Conjunctivae normal.     Pupils: Pupils are  equal, round, and reactive to light.     Comments: Blue eyes.  Neck:     Vascular: No JVD.  Cardiovascular:     Rate and Rhythm: Normal rate and regular rhythm.     Pulses: Normal pulses.     Heart sounds: Normal heart sounds. No murmur heard. No friction rub. No gallop.   Pulmonary:     Effort: Pulmonary effort is normal. No respiratory distress.     Breath sounds: Normal breath sounds. No wheezing, rhonchi or rales.  Chest:     Chest wall: No tenderness.  Breasts:     Right: No axillary adenopathy or supraclavicular adenopathy.     Left: No axillary adenopathy or supraclavicular adenopathy.    Abdominal:     General: Bowel sounds are normal. There is no distension.     Palpations: Abdomen is soft. There is no hepatomegaly, splenomegaly or mass.     Tenderness: There is no abdominal tenderness. There is no guarding or rebound.  Musculoskeletal:        General: No swelling or tenderness. Normal range of motion.     Cervical back: Normal range of motion and neck supple.  Lymphadenopathy:     Head:     Right side of head: No preauricular, posterior auricular or occipital adenopathy.     Left side of head: No preauricular, posterior auricular  or occipital adenopathy.     Cervical: No cervical adenopathy.     Upper Body:     Right upper body: No supraclavicular or axillary adenopathy.     Left upper body: No supraclavicular or axillary adenopathy.     Lower Body: No right inguinal adenopathy. No left inguinal adenopathy.  Skin:    General: Skin is warm and dry.     Coloration: Skin is not pale.     Findings: No bruising, erythema, lesion or rash.  Neurological:     Mental Status: She is alert and oriented to person, place, and time.  Psychiatric:        Behavior: Behavior normal.        Thought Content: Thought content normal.        Judgment: Judgment normal.    No visits with results within 3 Day(s) from this visit.  Latest known visit with results is:  Orders Only on  04/05/2020  Component Date Value Ref Range Status  . H. pylori, IgG AbS 04/25/2020 1.25* 0.00 - 0.79 Index Value Final   Comment:                              Negative           <0.80                              Equivocal    0.80 - 0.89                              Positive           >0.89   . H. pylori, IgA Abs 04/25/2020 <9.0  0.0 - 8.9 units Final   Comment:                                 Negative          <9.0                                 Equivocal   9.0 - 11.0                                 Positive         >11.0   . H pylori, IgM Abs 04/25/2020 <9.0  0.0 - 8.9 units Final   Comment:                                 Negative          <9.0                                 Equivocal   9.0 - 11.0                                 Positive         >11.0 This test was developed and its performance characteristics determined by [facility]. It  has not been cleared or approved by the Food and Drug Administration.     Assessment:  Connie Cameron is a 60 y.o. 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 Pathologyrevealed poorly differentiated squamous cell carcinoma. Malignant cells are positive for p16, p40, and pancytokeratin.   She presented with left cervical adenopathy. 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 Node was 4.0 x 2.3 x 1.8 cm.  Neck CTon 12/21/2018 revealed a 2.4 x 2.1 x 3.4 cm lymph node in the left upper neck. The lymph node showed heterogeneous enhancement and stranding in the surrounding fat. There was a 6 mm left upper posterior lymph node and a 7 mm right level 3 lymph node. Imaging was concerning for malignancy. There were no other enlarged lymph nodes. There was no pharyngeal mass. Neck CT with contrast on 11/03/2019 revealed no evidence of recurrent tumor or adenopathy.  There were post radiation changes in the anterior neck and larynx.  PET scan on 02/04/2019 revealed prior left level II cervical node had been excised. There were no findings on PET to suggest a site of primary head/neck cancer. There was symmetric hypermetabolism along the base of the tongue bilaterally favored to be physiologic. There was no evidence of metastatic disease.   Audiogramon 03/11/2019 revealed normal hearing in the right earthrough 8 kHz with mild hearing loss at 12 kHz only. Hearing was within normal limits in the left earthrough 3 kHz with mild-severe hearing loss from 4-12 kHz. Audiogram on 04/30/2019 revealed significant decrease in high-frequency hearing. 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  She received radiation from 03/30/2019 - 05/25/2019. She is s/p 2 cycles of cisplatin100 mg/m2 (03/30/2019 - 04/20/2019).  She received weekly cisplatin 30 mg/m2 x2 (05/11/2019 and 05/18/2019).  She received filgrastim-sndz support on 05/12/2019 and 05/13/2019.    She has had unexplained weight loss.  Colonoscopy on 03/30/2020 revealed diverticulosis in the sigmoid colon. EGD on 03/30/2020 showed esophageal mucosal changes suggestive of eosinophilic esophagitis. There was a mild Schatzki ring. There were white nummular lesions in esophageal mucosa. A mucosal nodule was found in the esophagus (benign squamous papilloma). Pathology showed moderate chronic active gastritis.  Chest, abdomen, and pelvis CT on 05/11/2020 revealed no findings to suggest metastatic disease within the chest, abdomen, or pelvis.  TSH and free T4 were normal on 02/08/2020.   Left lower extremity duplex on 07/14/2019 revealed no evidence of DVT.  Chest CT angiogram on 07/27/2019 revealed no definite evidence of pulmonary embolus.  There was a stable 5 mm linear density in the anterior portion of the left lower lobe. Follow-up CT scan in 12 months was recommended to ensure  stability.  She was admitted to [facility] 04/02/2019 -04/06/2019 with cellulitis involving the trapezius and right supraclavicular area. Port-a-cath was uninvolved. She received cefepime and vancomycin. She was discharged on doxycycline.  She was admitted to [facility] from 05/02/2019 to 05/05/2019 with fever of 100.5 and radiation mucositis. Patient was unable to eat or drink fluids secondary to sore throat.  Radiation was held. She was started on Decadron 4 mg BID.   Symptomatically, ***  Plan: 1.   Labs today:   2.Stage I left base of tongue carcinoma  Clinically, she is doing fair.   She has unexpected weight loss.   She is eating well; denies taste alteration.  Exam reveals  no evidence of recurrent disease. She completed concurrent cisplatin and radiation (05/25/2019).             Neck CT on 11/03/2019 personally reviewed.  Agree with radiology interpretation.   No evidence of locally recurrent disease. 3.   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 4.   Normocytic anemia  Hematocrit 25.9.  Hemoglobin 8.5.    MCV 98.5 on 06/30/2019.  Hematocrit 31.2.  Hemoglobin 10.5.  MCV 93.4 on 07/28/2019.  Hematocrit 33.2.  Hemoglobin 11.1.  MCV 89.7 on 02/08/2020.  CBC continues to improve following completion of treatment.  5.   Weight loss  Patient previously weighed 153 pounds on 06/30/2019.    She currently weighs 128 pounds.  Patient has lost 25 pounds.  Weight loss is unintentional.  She is eating well.  No nausea, vomiting or diarrhea.  Check TSH and free T4 (previously normal on 06/30/2019).  Discuss plans for chest, abdomen and pelvis CT.   Patient in agreement. 6.  Chest pain  Patient notes history of transient midsternal chest pain of unclear etiology.  She also notes a transient left-sided facial/jaw numbness.  Encourage patient to follow-up with PCP or ER  for acute symptoms 7.   Health maintenance  Patient has never had a mammogram.  Patient has never had a colonoscopy.  Schedule mammogram and GI consult.. 8.   Mammogram 02/14/2020. 9.   Chest, abdomen, and pelvis CT on 02/14/2020. 10.   GI consult. 11.   RN:  Assist with obtaining PCP (provider noted on her card). 12.   RTC after imaging studies for MD assessment and discussion regarding direction of therapy.  I discussed the assessment and treatment plan with the patient. The patient was provided an opportunity to ask questions and all were answered. The patient agreed with the plan and demonstrated an understanding of the instructions. The patient was advised to call back if the symptoms worsen or if the condition fails to improve as anticipated.  I provided *** minutes of face-to-face time during this this encounter and > 50% was spent counseling as documented under my assessment and plan.   Lequita Asal, MD, PhD    05/11/2020, 10:57 AM  I, De Burrs, am acting as a Education administrator for Lequita Asal, MD.  I, [place] Mike Gip, MD, have reviewed the above documentation for accuracy and completeness, and I agree with the above.

## 2020-05-15 ENCOUNTER — Inpatient Hospital Stay: Payer: Medicaid Other | Attending: Hematology and Oncology | Admitting: Hematology and Oncology

## 2020-05-15 DIAGNOSIS — C760 Malignant neoplasm of head, face and neck: Secondary | ICD-10-CM

## 2020-05-15 DIAGNOSIS — D649 Anemia, unspecified: Secondary | ICD-10-CM

## 2020-05-15 DIAGNOSIS — R634 Abnormal weight loss: Secondary | ICD-10-CM

## 2020-05-15 IMAGING — DX DG CHEST 1V PORT
1 series · 1 of 1 positions shown · non-contrast
Comparison: 12/21/2018 plain film, CT from 04/02/2019

CLINICAL DATA: Fevers, history of throat carcinoma

EXAM:
PORTABLE CHEST 1 VIEW

[chest ap]
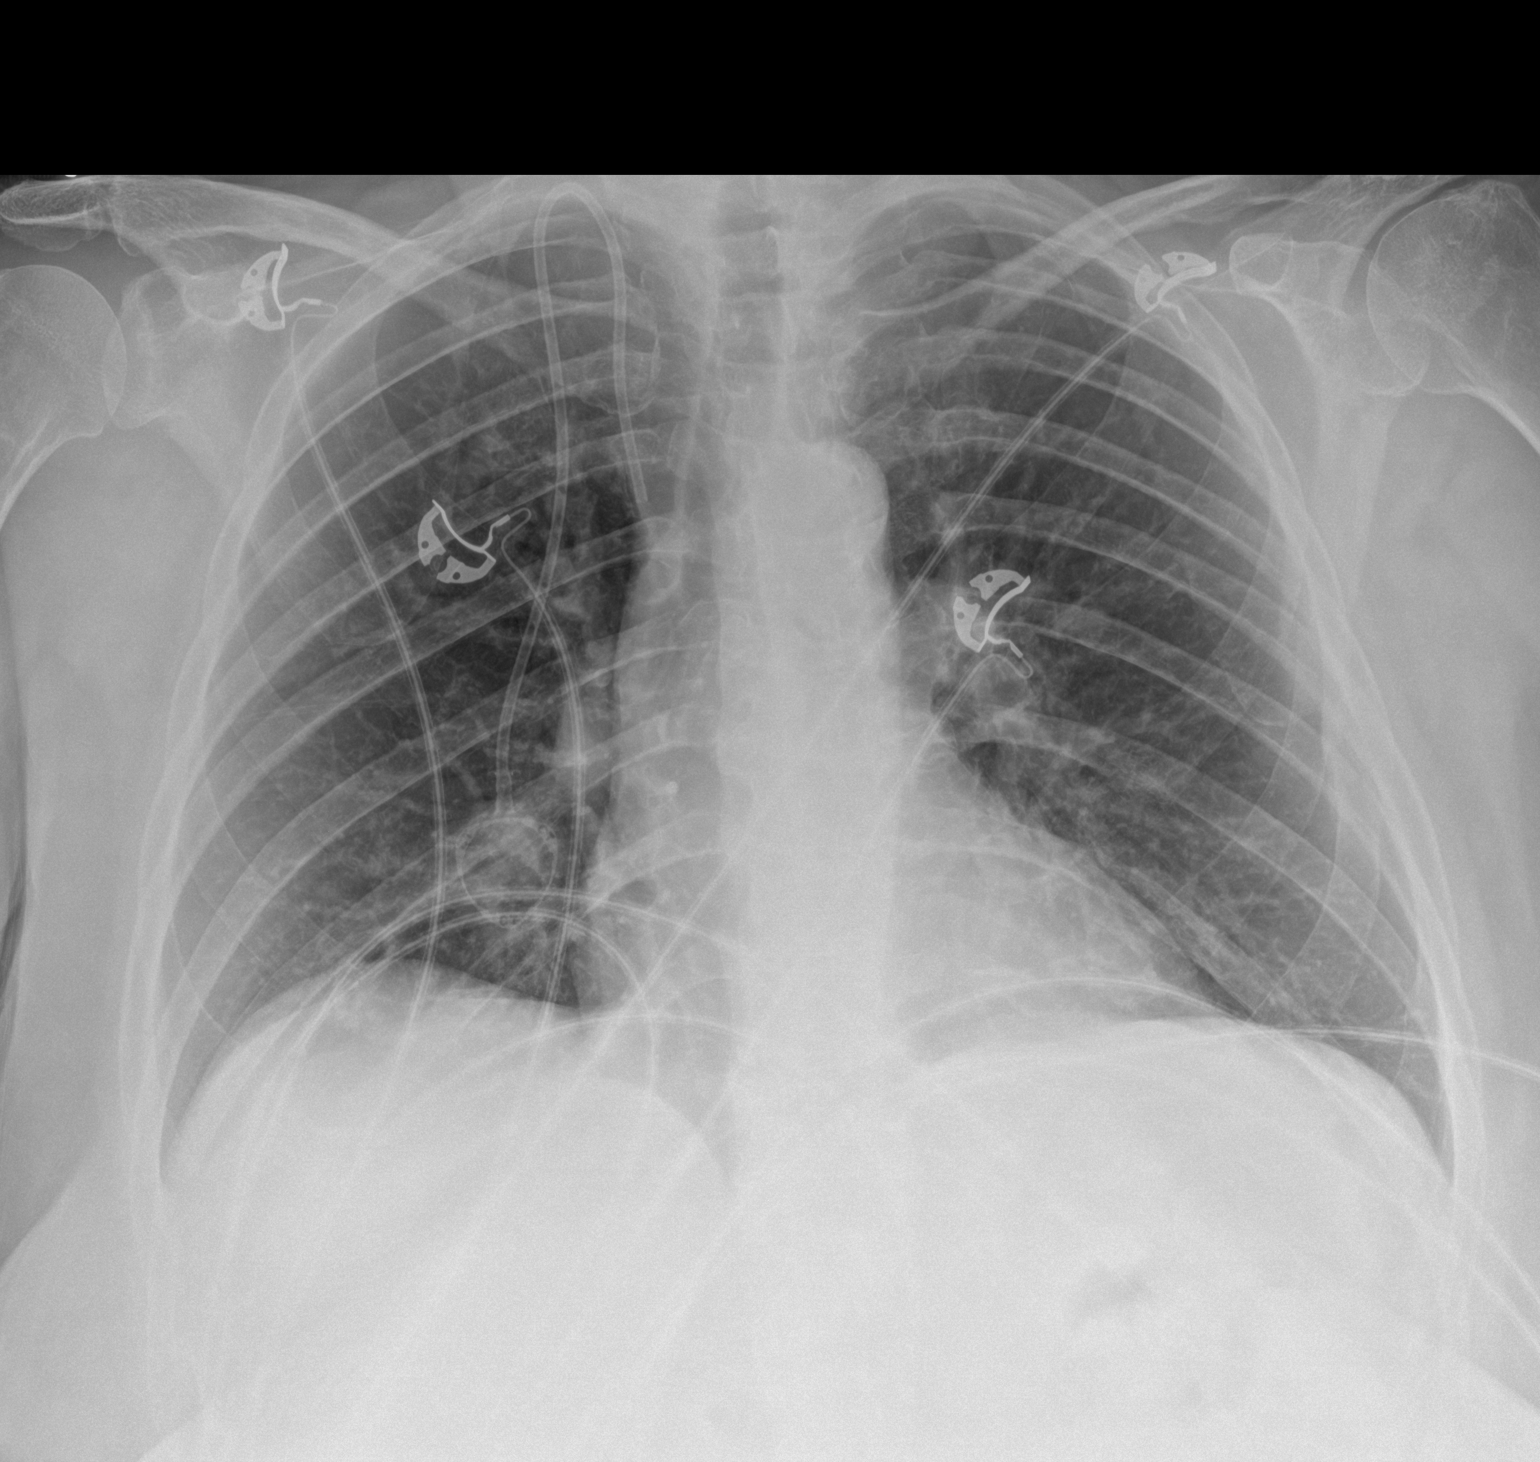

[1 of 1 positions shown; findings below may reference images not displayed]

FINDINGS: Cardiac shadow is within normal limits. Aortic calcifications are
noted. Right chest wall port is noted with the catheter tip in the
proximal superior vena cava. No focal infiltrate or effusion is
seen. No bony abnormality is noted.
IMPRESSION: No acute abnormality seen.

## 2020-05-17 ENCOUNTER — Ambulatory Visit
Admission: RE | Admit: 2020-05-17 | Discharge: 2020-05-17 | Disposition: A | Discharge status: Home / Self Care | Payer: Medicaid Other | Source: Ambulatory Visit | Attending: Radiation Oncology | Admitting: Radiation Oncology

## 2020-05-17 ENCOUNTER — Encounter: Payer: Self-pay | Admitting: Radiation Oncology

## 2020-05-17 VITALS — BP 110/83 | HR 80 | Temp 97.8°F | Wt 138.0 lb

## 2020-05-17 DIAGNOSIS — C029 Malignant neoplasm of tongue, unspecified: Secondary | ICD-10-CM

## 2020-05-17 NOTE — Progress Notes (Signed)
Radiation Oncology Follow up Note  Name: Connie Cameron   Date:   05/17/2020 MRN:  [id number] DOB: 07-17-1960    This 60 y.o. female presents to the clinic today for 54-month follow-up status post concurrent chemoradiation therapy for p16 positive squamous cell carcinoma the base of tongue.  REFERRING PROVIDER: No ref. provider found  HPI: Patient is a 60 year old female now at 11 months having completed concurrent chemoradiation for p16 positive squamous cell carcinoma base of tongue.  Seen today in routine follow-up she is doing fairly well still has some slight dysphagia.  Dr. Farrel Conners is following a small area which sounds like granulation tissue in the tongue base..  She had a recent CT scan of chest abdomen pelvis showing no acute findings to suggest metastatic disease or other pathology.  She had a CT scan of the soft tissue of the head and neck back in September showing no evidence of recurrent tumor or adenopathy.  COMPLICATIONS OF TREATMENT: none  FOLLOW UP COMPLIANCE: keeps appointments   PHYSICAL EXAM:  BP 110/83   Pulse 80   Temp 97.8 F (36.6 C) (Tympanic)   Wt 138 lb (62.6 kg)   BMI 24.45 kg/m  No evidence of cervical or supraclavicular adenopathy.  Well-developed well-nourished patient in NAD. HEENT reveals PERLA, EOMI, discs not visualized.  Oral cavity is clear. No oral mucosal lesions are identified. Neck is clear without evidence of cervical or supraclavicular adenopathy. Lungs are clear to A&P. Cardiac examination is essentially unremarkable with regular rate and rhythm without murmur rub or thrill. Abdomen is benign with no organomegaly or masses noted. Motor sensory and DTR levels are equal and symmetric in the upper and lower extremities. Cranial nerves II through XII are grossly intact. Proprioception is intact. No peripheral adenopathy or edema is identified. No motor or sensory levels are noted. Crude visual fields are within normal range.  RADIOLOGY RESULTS: CT  scans of the soft tissue of head and neck chest abdomen pelvis all reviewed compatible with above-stated findings  PLAN: Present time patient is doing well with no evidence of disease she is being closely monitored by Dr. Gibson Ramp every month.  Following a small area of granulation tissue.  She is slowly recuperating.  Some taste is returning mild dysphagia.  I am pleased with her overall progress.  I have asked to see her back in 6 months for follow-up.  She knows to call with any concerns.  I would like to take this opportunity to thank you for allowing me to participate in the care of your patient.[name], MD

## 2020-05-30 ENCOUNTER — Encounter: Payer: Self-pay | Admitting: Oncology

## 2020-05-30 ENCOUNTER — Other Ambulatory Visit: Payer: Self-pay

## 2020-05-30 ENCOUNTER — Inpatient Hospital Stay: Payer: Medicaid Other

## 2020-05-30 ENCOUNTER — Inpatient Hospital Stay: Payer: Medicaid Other | Attending: Hematology and Oncology | Admitting: Oncology

## 2020-05-30 VITALS — BP 114/78 | HR 83 | Temp 97.2°F | Resp 16 | Wt 141.9 lb

## 2020-05-30 DIAGNOSIS — H9202 Otalgia, left ear: Secondary | ICD-10-CM | POA: Diagnosis not present

## 2020-05-30 DIAGNOSIS — D649 Anemia, unspecified: Secondary | ICD-10-CM | POA: Insufficient documentation

## 2020-05-30 DIAGNOSIS — E538 Deficiency of other specified B group vitamins: Secondary | ICD-10-CM | POA: Diagnosis not present

## 2020-05-30 DIAGNOSIS — R42 Dizziness and giddiness: Secondary | ICD-10-CM | POA: Insufficient documentation

## 2020-05-30 DIAGNOSIS — Z803 Family history of malignant neoplasm of breast: Secondary | ICD-10-CM | POA: Diagnosis not present

## 2020-05-30 DIAGNOSIS — Z95828 Presence of other vascular implants and grafts: Secondary | ICD-10-CM

## 2020-05-30 DIAGNOSIS — H9042 Sensorineural hearing loss, unilateral, left ear, with unrestricted hearing on the contralateral side: Secondary | ICD-10-CM | POA: Diagnosis not present

## 2020-05-30 DIAGNOSIS — K1370 Unspecified lesions of oral mucosa: Secondary | ICD-10-CM | POA: Insufficient documentation

## 2020-05-30 DIAGNOSIS — Z808 Family history of malignant neoplasm of other organs or systems: Secondary | ICD-10-CM | POA: Insufficient documentation

## 2020-05-30 DIAGNOSIS — R079 Chest pain, unspecified: Secondary | ICD-10-CM | POA: Diagnosis not present

## 2020-05-30 DIAGNOSIS — R634 Abnormal weight loss: Secondary | ICD-10-CM | POA: Insufficient documentation

## 2020-05-30 DIAGNOSIS — C01 Malignant neoplasm of base of tongue: Secondary | ICD-10-CM | POA: Diagnosis present

## 2020-05-30 DIAGNOSIS — Z9221 Personal history of antineoplastic chemotherapy: Secondary | ICD-10-CM | POA: Diagnosis not present

## 2020-05-30 DIAGNOSIS — K222 Esophageal obstruction: Secondary | ICD-10-CM | POA: Diagnosis not present

## 2020-05-30 DIAGNOSIS — R5383 Other fatigue: Secondary | ICD-10-CM | POA: Diagnosis not present

## 2020-05-30 DIAGNOSIS — Z79899 Other long term (current) drug therapy: Secondary | ICD-10-CM | POA: Insufficient documentation

## 2020-05-30 DIAGNOSIS — Z923 Personal history of irradiation: Secondary | ICD-10-CM | POA: Diagnosis not present

## 2020-05-30 DIAGNOSIS — K200 Eosinophilic esophagitis: Secondary | ICD-10-CM | POA: Diagnosis not present

## 2020-05-30 LAB — CBC WITH DIFFERENTIAL/PLATELET
Abs Immature Granulocytes: 0.03 10*3/uL (ref 0.00–0.07)
Basophils Absolute: 0 10*3/uL (ref 0.0–0.1)
Basophils Relative: 1 %
Eosinophils Absolute: 0.1 10*3/uL (ref 0.0–0.5)
Eosinophils Relative: 3 %
HCT: 34.3 % — ABNORMAL LOW (ref 36.0–46.0)
Hemoglobin: 11.5 g/dL — ABNORMAL LOW (ref 12.0–15.0)
Immature Granulocytes: 1 %
Lymphocytes Relative: 18 %
Lymphs Abs: 0.9 10*3/uL (ref 0.7–4.0)
MCH: 30.7 pg (ref 26.0–34.0)
MCHC: 33.5 g/dL (ref 30.0–36.0)
MCV: 91.7 fL (ref 80.0–100.0)
Monocytes Absolute: 0.5 10*3/uL (ref 0.1–1.0)
Monocytes Relative: 10 %
Neutro Abs: 3.5 10*3/uL (ref 1.7–7.7)
Neutrophils Relative %: 67 %
Platelets: 228 10*3/uL (ref 150–400)
RBC: 3.74 MIL/uL — ABNORMAL LOW (ref 3.87–5.11)
RDW: 12.6 % (ref 11.5–15.5)
WBC: 5.1 10*3/uL (ref 4.0–10.5)
nRBC: 0 % (ref 0.0–0.2)

## 2020-05-30 LAB — COMPREHENSIVE METABOLIC PANEL
ALT: 14 U/L (ref 0–44)
AST: 19 U/L (ref 15–41)
Albumin: 3.8 g/dL (ref 3.5–5.0)
Alkaline Phosphatase: 90 U/L (ref 38–126)
Anion gap: 5 (ref 5–15)
BUN: 20 mg/dL (ref 6–20)
CO2: 28 mmol/L (ref 22–32)
Calcium: 9 mg/dL (ref 8.9–10.3)
Chloride: 104 mmol/L (ref 98–111)
Creatinine, Ser: 0.93 mg/dL (ref 0.44–1.00)
GFR, Estimated: >60 mL/min (ref >60)
Glucose, Bld: 90 mg/dL (ref 70–99)
Potassium: 4.2 mmol/L (ref 3.5–5.1)
Sodium: 137 mmol/L (ref 135–145)
Total Bilirubin: 0.4 mg/dL (ref 0.3–1.2)
Total Protein: 6.8 g/dL (ref 6.5–8.1)

## 2020-05-30 LAB — VITAMIN B12: Vitamin B-12: 76 pg/mL — ABNORMAL LOW (ref 180–914)

## 2020-05-30 LAB — IRON AND TIBC
Iron: 71 ug/dL (ref 28–170)
Saturation Ratios: 26 % (ref 10.4–31.8)
TIBC: 274 ug/dL (ref 250–450)
UIBC: 203 ug/dL

## 2020-05-30 LAB — FERRITIN: Ferritin: 103 ng/mL (ref 11–307)

## 2020-05-30 MED ORDER — HEPARIN SOD (PORK) LOCK FLUSH 100 UNIT/ML IV SOLN
500.0000 [IU] | Freq: Once | INTRAVENOUS | Status: AC
  Administered 2020-05-30: 500 [IU] via INTRAVENOUS
  Filled 2020-05-30: qty 5

## 2020-05-30 MED ORDER — SODIUM CHLORIDE 0.9% FLUSH
10.0000 mL | Freq: Once | INTRAVENOUS | Status: AC
  Administered 2020-05-30: 10 mL via INTRAVENOUS
  Filled 2020-05-30: qty 10

## 2020-05-30 NOTE — Progress Notes (Signed)
Pt states her left ear has been in pain for the past couple of days. Not taking any medication for it. There is a spot in the pts throat that does not feel comfortable, not painful and does not seem to be increasing in size.

## 2020-05-30 NOTE — Progress Notes (Signed)
[facility]  [address] [place], [place] [address] Phone: [phone]  Fax: [phone]   Clinic Day:  05/30/2020  Referring physician: No ref. provider found  Chief Complaint: Connie Cameron is a 60 y.o. 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   HPI: The patient was last seen in the medical oncology clinic on 02/08/20.  At that time, she continued to lose weight unintentionally.  She had episodes of middle chest discomfort accompanied by dizziness.  Jennet Maduro, RD called the patient on 08/05/2019 but there was no answer.  Neck CT with contrast on 11/03/2019 revealed no evidence of recurrent tumor or adenopathy. There were post radiation changes in the anterior neck and larynx.  The patient saw Dr. Baruch Gouty on 11/10/2019. She was doing well with no evidence of disease. She reported some loss of taste. Follow-up was planned for 6 months.  She had colonoscopy/EGD with Dr. Delice Lesch on 03/30/2020 which showed a few diverticuli in the sigmoid colon, eosinophilic esophagitis, mild Schatzki ring, white lesion in distal esophagus and 4 mm mucosal nodule in proximal esophagus. Per patient she is scheduled to have the lesion removed by Dr. Delice Lesch later this month.  Biopsies were negative for dysplasia and malignancy in stomach and esophagus.  She met with ENT at the beginning of the month for follow-up and reported no suspicious findings.  She did report a small lesion in the back of her mouth that she was concerned about.  His recommendations were to watch at this time.  In the interim, she has gained some weight.  She is forcing self to eat food and drink boost.  She still does not have a very good appetite.  She is tired and easily fatigued.  She has intermittent left ear pain and persistent white lesion in the back of her mouth.  Denies any new swollen  lymph nodes.  She is concerned given symptoms are similar to when she initially presented for her cancer.   Past Medical History:  Diagnosis Date  . 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 COLONOSCOPY WITH PROPOFOL N/A 03/30/2020   Procedure: COLONOSCOPY WITH PROPOFOL;  Surgeon: Virgel Manifold, MD;  Location: [facility] ENDOSCOPY;  Service: Endoscopy;  Laterality: N/A;  COVID POSITIVE 02/29/2020  . ESOPHAGOGASTRODUODENOSCOPY (EGD) WITH PROPOFOL N/A 03/30/2020   Procedure: ESOPHAGOGASTRODUODENOSCOPY (EGD) WITH PROPOFOL;  Surgeon: Virgel Manifold, MD;  Location: [facility] ENDOSCOPY;  Service: Endoscopy;  Laterality: N/A;  . EXCISION MASS NECK Left 01/18/2019   Procedure: EXCISION MASS NECK/NODE;  Surgeon: Margaretha Sheffield, MD;  Location: [facility] ORS;  Service: ENT;  Laterality: Left;  . LARYNGOSCOPY Bilateral 02/22/2019   Procedure: MICROSCOPIC DIRECT LARYNGOSCOPY AND BIOPSY;  Surgeon: Margaretha Sheffield, MD;  Location: [facility] ORS;  Service: ENT;  Laterality: Bilateral;  . PORTA CATH INSERTION N/A 03/24/2019   Procedure: PORTA CATH INSERTION;  Surgeon: Katha Cabal, MD;  Location: [facility];  Service: Cardiovascular;  Laterality: N/A;  . TONSILLECTOMY    . TUBAL LIGATION      Family History  Problem Relation Age of Onset  . Breast cancer Sister   . Brain cancer Paternal Grandfather     Social History:  reports that she has never smoked. She has never used smokeless tobacco. She reports previous alcohol use. She reports that she does not use drugs. She  use to drink years ago. She denies any exposure to radiation or toxins. 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 She lives in [place]. The patient is alone today.  Allergies: No Known Allergies  Current Medications: Current Outpatient Medications  Medication Sig Dispense Refill  . Na Sulfate-K Sulfate-Mg Sulf 17.5-3.13-1.6 GM/177ML SOLN At 5 PM the day before  procedure take 1 bottle and 5 hours before procedure take 1 bottle. (Patient not taking: Reported on 05/17/2020) 354 mL 0  . omeprazole (PRILOSEC) 20 MG capsule Take 1 capsule (20 mg total) by mouth daily. 30 capsule 0   No current facility-administered medications for this visit.   Facility-Administered Medications Ordered in Other Visits  Medication Dose Route Frequency Provider Last Rate Last Admin  . heparin lock flush 100 unit/mL  500 Units Intravenous Once Jacquelin Hawking, NP        Review of Systems  Constitutional: Positive for malaise/fatigue. Negative for chills, fever and weight loss.  HENT: Positive for tinnitus. Negative for congestion and ear pain.        Small white lesion noted in back of mouth  Eyes: Negative.  Negative for blurred vision and double vision.  Respiratory: Negative.  Negative for cough, sputum production and shortness of breath.   Cardiovascular: Negative.  Negative for chest pain, palpitations and leg swelling.  Gastrointestinal: Negative.  Negative for abdominal pain, constipation, diarrhea, nausea and vomiting.  Genitourinary: Negative for dysuria, frequency and urgency.  Musculoskeletal: Negative for back pain and falls.  Skin: Negative.  Negative for rash.  Neurological: Positive for weakness. Negative for headaches.  Endo/Heme/Allergies: Negative.  Does not bruise/bleed easily.  Psychiatric/Behavioral: Negative.  Negative for depression. The patient is not nervous/anxious and does not have insomnia.    Performance status (ECOG): 1  Vitals Blood pressure 114/78, pulse 83, temperature (!) 97.2 F (36.2 C), resp. rate 16, weight 141 lb 13.9 oz (64.4 kg), SpO2 100 %.   Physical Exam Vitals and nursing note reviewed.  Constitutional:      General: She is not in acute distress.    Appearance: She is well-developed. She is not diaphoretic.     Interventions: Face mask in place.  HENT:     Head: Normocephalic and atraumatic.     Comments: Long  blonde hair.    Mouth/Throat:     Mouth: No oral lesions.     Pharynx: Oropharynx is clear. No oropharyngeal exudate.  Eyes:     General: No scleral icterus.    Extraocular Movements: Extraocular movements intact.     Conjunctiva/sclera: Conjunctivae normal.     Pupils: Pupils are equal, round, and reactive to light.     Comments: Blue eyes.  Neck:     Vascular: No JVD.  Cardiovascular:     Rate and Rhythm: Normal rate and regular rhythm.     Pulses: Normal pulses.     Heart sounds: Normal heart sounds. No murmur heard. No friction rub. No gallop.   Pulmonary:     Effort: Pulmonary effort is normal. No respiratory distress.     Breath sounds: Normal breath sounds. No wheezing, rhonchi or rales.  Chest:     Chest wall: No tenderness.  Breasts:     Right: No axillary adenopathy or supraclavicular adenopathy.     Left: No axillary adenopathy or supraclavicular adenopathy.    Abdominal:     General: Bowel sounds are normal. There is no distension.  Palpations: Abdomen is soft. There is no hepatomegaly, splenomegaly or mass.     Tenderness: There is no abdominal tenderness. There is no guarding or rebound.  Musculoskeletal:        General: No swelling or tenderness. Normal range of motion.     Cervical back: Normal range of motion and neck supple.  Lymphadenopathy:     Head:     Right side of head: No preauricular, posterior auricular or occipital adenopathy.     Left side of head: No preauricular, posterior auricular or occipital adenopathy.     Cervical: No cervical adenopathy.     Upper Body:     Right upper body: No supraclavicular or axillary adenopathy.     Left upper body: No supraclavicular or axillary adenopathy.     Lower Body: No right inguinal adenopathy. No left inguinal adenopathy.  Skin:    General: Skin is warm and dry.     Coloration: Skin is not pale.     Findings: No bruising, erythema, lesion or rash.  Neurological:     Mental Status: She is alert and  oriented to person, place, and time.  Psychiatric:        Behavior: Behavior normal.        Thought Content: Thought content normal.        Judgment: Judgment normal.    No visits with results within 3 Day(s) from this visit.  Latest known visit with results is:  Hospital Outpatient Visit on 05/11/2020  Component Date Value Ref Range Status  . Creatinine, Ser 05/11/2020 0.90  0.44 - 1.00 mg/dL Final    Assessment:  Connie Cameron is a 60 y.o. 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 Pathologyrevealed poorly differentiated squamous cell carcinoma. Malignant cells are positive for p16, p40, and pancytokeratin.   She presented with left cervical adenopathy. 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 Node was 4.0 x 2.3 x 1.8 cm.  Neck CTon 12/21/2018 revealed a 2.4 x 2.1 x 3.4 cm lymph node in the left upper neck. The lymph node showed heterogeneous enhancement and stranding in the surrounding fat. There was a 6 mm left upper posterior lymph node and a 7 mm right level 3 lymph node. Imaging was concerning for malignancy. There were no other enlarged lymph nodes. There was no pharyngeal mass. Neck CT with contrast on 11/03/2019 revealed no evidence of recurrent tumor or adenopathy. There were post radiation changes in the anterior neck and larynx.  PET scan on 02/04/2019 revealed prior left level II cervical node had been excised. There were no findings on PET to suggest a site of primary head/neck cancer. There was symmetric hypermetabolism along the base of the tongue bilaterally favored to be physiologic. There was no evidence of metastatic disease.   Audiogramon 03/11/2019 revealed normal hearing in the right earthrough 8 kHz with mild hearing loss at 12 kHz only. Hearing was within  normal limits in the left earthrough 3 kHz with mild-severe hearing loss from 4-12 kHz. Audiogram on 04/30/2019 revealed significant decrease in high-frequency hearing. 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  She received radiation from 03/30/2019 - 05/25/2019. She is s/p 2 cycles of cisplatin100 mg/m2 (03/30/2019 - 04/20/2019).  She received weekly cisplatin 30 mg/m2 x2 (05/11/2019 and 05/18/2019).  She received filgrastim-sndz support on 05/12/2019 and 05/13/2019.  Left lower extremity duplex on 07/14/2019 revealed no evidence of DVT.  Chest CT angiogram on 07/27/2019 revealed no definite evidence of pulmonary embolus.  There was a stable 5 mm linear density in the anterior portion of the left lower lobe. Follow-up CT scan in 12 months was recommended to ensure stability.  She was admitted to [facility] 04/02/2019 -04/06/2019 with cellulitis involving the trapezius and right supraclavicular area. Port-a-cath was uninvolved. She received cefepime and vancomycin. She was discharged on doxycycline.  She was admitted to [facility] from 05/02/2019 to 05/05/2019 with fever of 100.5 and radiation mucositis. Patient was unable to eat or drink fluids secondary to sore throat.  Radiation was held. She was started on Decadron 4 mg BID.   Symptomatically, she has gained some weight approximately 13 pounds.  She had a colonoscopy and EGD.  She continues to have a burning sensation in her chest.  Denies any nausea, vomiting or diarrhea.  Has concerns of a weight small lesion in mouth.  No lymphadenopathy.  Stable exam.  Plan: 1.   Labs today 2.Stage I left base of tongue carcinoma  -Clinically, she is doing fair.  -She has gained weight since her last visit.  -States she is forcing herself to eat food and drink boost.  -Completed concurrent cisplatin and radiation (05/25/2019).             -Neck CT on 11/03/2019 No evidence of locally recurrent  disease.  -She is followed by ENT Dr. Baldemar Friday at the beginning of April 2022.  -Most recent imaging from CT chest abdomen pelvis from 05/12/2020 did not show any evidence of metastatic disease.  -Patient has concerns for small lesion in the back of her mouth.  ENT is aware.    -Recommend imaging with CT of her neck ASAP along with follow-up with ENT. 3.   Normocytic anemia  -Labs from 05/30/2020 show hemoglobin of 11.5, ferritin 103, saturation ratio 26%.  -B12 level 76.  Recommend B12 injections monthly x6. 4.   Weight loss  -Improved  -Weight 141 lbs  -CT C/A/P- No evidence of metastatic disease. 5.   Health maintenance  -She had a colonoscopy/EGD on 03/30/2020 which showed a few diverticuli in the sigmoid colon, eosinophilic esophagitis, mild Schatzki ring, white lesion in distal esophagus and 4 mm mucosal nodule in proximal esophagus.  -Per patient she is scheduled to have the lesion removed by Dr. Delice Lesch later this month.  -I do not see where she has had a mammogram. 6.  B12 deficiency  -Labs from 05/30/2020 show a B12 level of 76.  -We will arrange for her to have monthly B12 injections x6. 7.  Lesion in mouth  -Has been present for approximately 4 weeks.  -ENT recommended waiting to see if it resolves on its own.  -Is not painful.  -Given his persistent, would recommend CT of her neck ASAP.   -Patient is in agreement. Plan: -Labs today (CBC, CMP, ferritin, iron and B12) - CT soft tissue neck in 1 to 2 weeks for further evaluation of left ear pain and small white lesion in mouth. -RTC after CT scan to discuss results. -RTC monthly for B12 injections. -RTC and 6 months for lab work (CBC, CMP, ferritin, iron and B12), MD assessment and B12 injection.  I discussed the assessment and treatment plan with the patient. The patient was provided an opportunity to ask questions and all were answered. The patient agreed with the plan and demonstrated an understanding of the  instructions. The  patient was advised to call back if the symptoms worsen or if the condition fails to improve as anticipated.  Greater than 50% was spent in counseling and coordination of care with this patient including but not limited to discussion of the relevant topics above (See A&P) including, but not limited to diagnosis and management of acute and chronic medical conditions.   Faythe Casa, NP 05/30/2020 5:41 PM

## 2020-06-02 ENCOUNTER — Telehealth: Payer: Self-pay | Admitting: Oncology

## 2020-06-02 NOTE — Telephone Encounter (Signed)
06/02/2020 Left vm informing pt of CT on 5/10 @ 1:30 at [facility], per Rulon Abide. Reminded pt to be NPO 4 hrs prior and to arrive 15 mins before appt. Also let her know that a f/u appt w/ the NP to go over results was made for 5/12 @ 2:15. Added monthly B12 inj starting at this visit. Will give pt an updated schedule at this appt. Gave call back number in case there were any questions or concerns SRW

## 2020-06-09 IMAGING — DX DG CHEST 1V PORT
1 series · 1 of 1 positions shown · non-contrast
Comparison: 05/02/2019

CLINICAL DATA: Fever in the setting of carcinoma of the tongue

EXAM:
PORTABLE CHEST 1 VIEW

[chest ap]
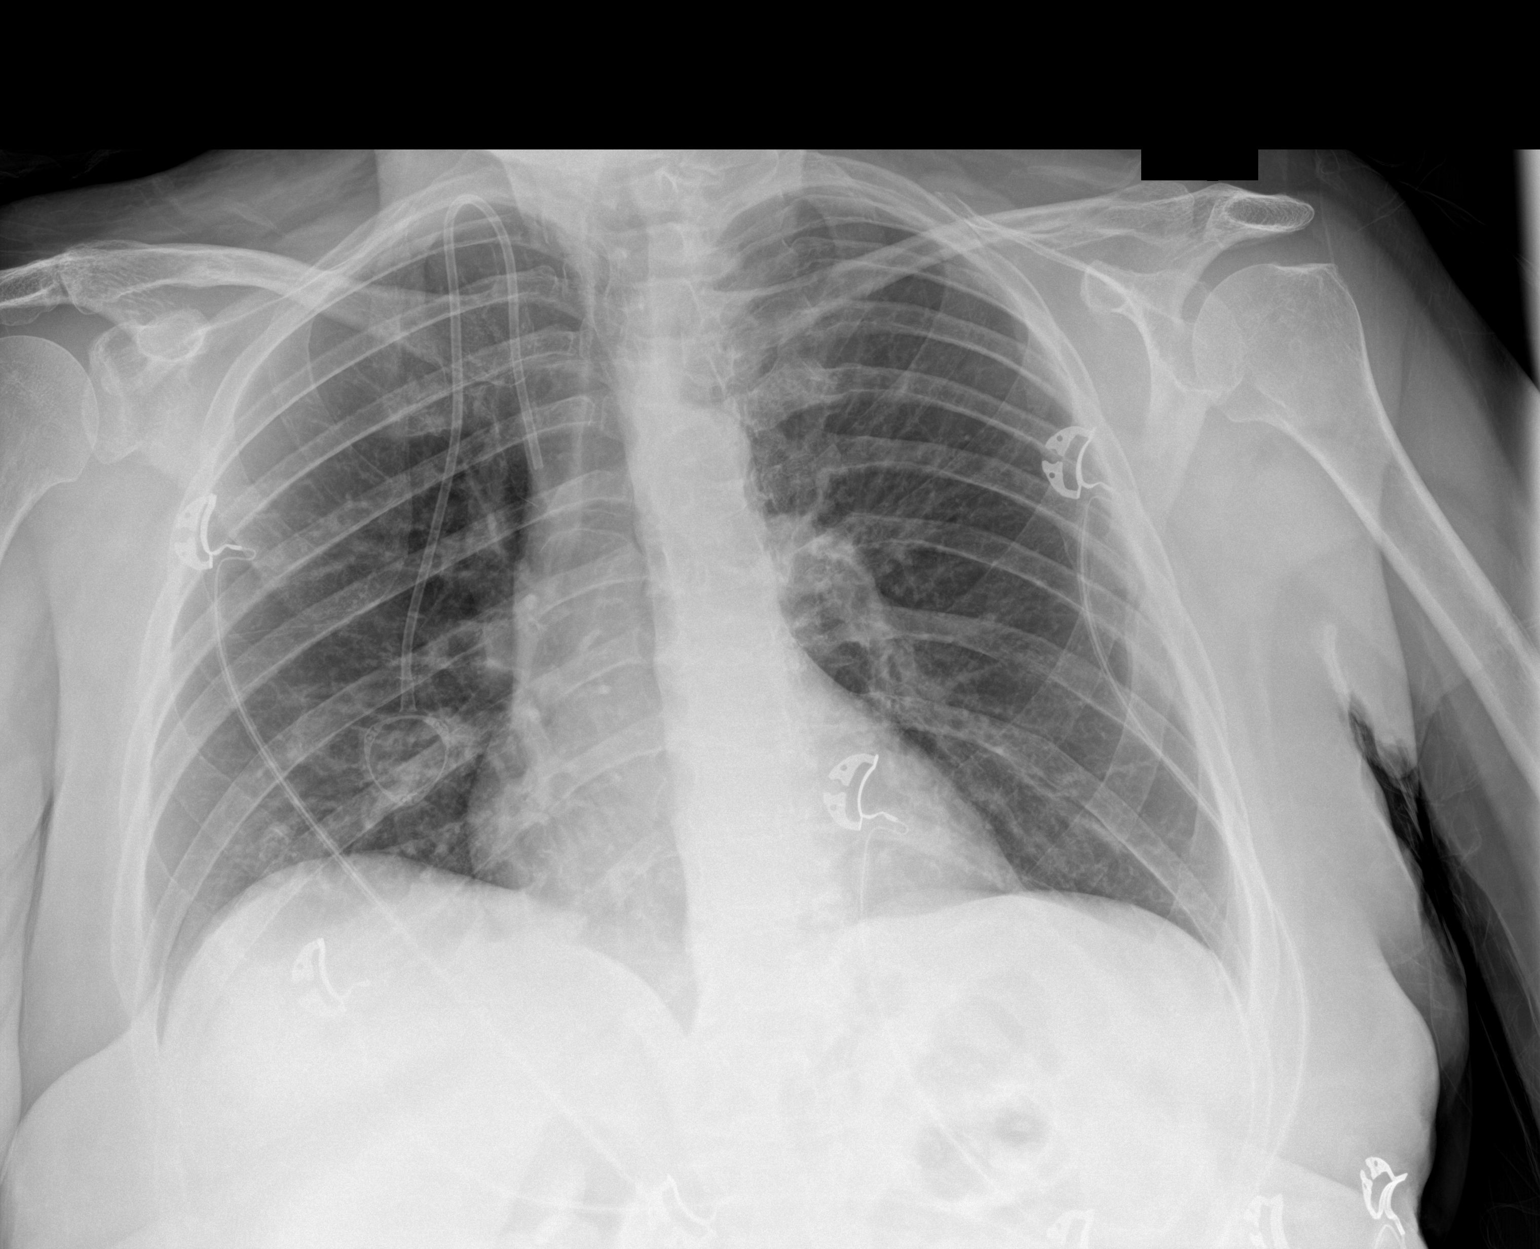

[1 of 1 positions shown; findings below may reference images not displayed]

FINDINGS: The heart size and mediastinal contours are within normal limits.
Both lungs are clear. The visualized skeletal structures are
unremarkable. Unchanged position of right chest wall Port-A-Cath
with tip in the mid SVC via an internal jugular vein approach.
IMPRESSION: No active disease.

## 2020-06-13 ENCOUNTER — Ambulatory Visit
Admission: RE | Admit: 2020-06-13 | Discharge: 2020-06-13 | Disposition: A | Discharge status: Home / Self Care | Payer: Medicaid Other | Source: Ambulatory Visit | Attending: Oncology | Admitting: Oncology

## 2020-06-13 ENCOUNTER — Other Ambulatory Visit: Payer: Self-pay

## 2020-06-13 DIAGNOSIS — C01 Malignant neoplasm of base of tongue: Secondary | ICD-10-CM | POA: Diagnosis present

## 2020-06-13 MED ORDER — IOHEXOL 300 MG/ML  SOLN
75.0000 mL | Freq: Once | INTRAMUSCULAR | Status: AC | PRN
  Administered 2020-06-13: 75 mL via INTRAVENOUS

## 2020-06-15 ENCOUNTER — Encounter: Payer: Self-pay | Admitting: Nurse Practitioner

## 2020-06-15 ENCOUNTER — Other Ambulatory Visit: Payer: Self-pay

## 2020-06-15 ENCOUNTER — Inpatient Hospital Stay: Payer: Medicaid Other | Attending: Nurse Practitioner | Admitting: Nurse Practitioner

## 2020-06-15 ENCOUNTER — Inpatient Hospital Stay: Payer: Medicaid Other

## 2020-06-15 VITALS — BP 129/81 | HR 88 | Temp 97.8°F | Resp 20 | Wt 146.9 lb

## 2020-06-15 DIAGNOSIS — Z9221 Personal history of antineoplastic chemotherapy: Secondary | ICD-10-CM | POA: Diagnosis not present

## 2020-06-15 DIAGNOSIS — Z923 Personal history of irradiation: Secondary | ICD-10-CM | POA: Insufficient documentation

## 2020-06-15 DIAGNOSIS — H905 Unspecified sensorineural hearing loss: Secondary | ICD-10-CM | POA: Insufficient documentation

## 2020-06-15 DIAGNOSIS — H9202 Otalgia, left ear: Secondary | ICD-10-CM | POA: Diagnosis not present

## 2020-06-15 DIAGNOSIS — C01 Malignant neoplasm of base of tongue: Secondary | ICD-10-CM | POA: Diagnosis present

## 2020-06-15 DIAGNOSIS — Z79899 Other long term (current) drug therapy: Secondary | ICD-10-CM | POA: Insufficient documentation

## 2020-06-15 DIAGNOSIS — K1370 Unspecified lesions of oral mucosa: Secondary | ICD-10-CM | POA: Diagnosis not present

## 2020-06-15 NOTE — Progress Notes (Signed)
[facility]  [address], [address] [place], [place] [address] Phone: [phone]  Fax: [phone]   Clinic Day:  06/15/2020  Referring physician: No ref. provider found  Chief Complaint: Connie Cameron is a 59 y.o. 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   Oncology History: Connie Cameron is a 60 y.o. 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 Pathologyrevealed poorly differentiated squamous cell carcinoma. Malignant cells are positive for p16, p40, and pancytokeratin.   She presented with left cervical adenopathy. 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 Node was 4.0 x 2.3 x 1.8 cm.  Neck CTon 12/21/2018 revealed a 2.4 x 2.1 x 3.4 cm lymph node in the left upper neck. The lymph node showed heterogeneous enhancement and stranding in the surrounding fat. There was a 6 mm left upper posterior lymph node and a 7 mm right level 3 lymph node. Imaging was concerning for malignancy. There were no other enlarged lymph nodes. There was no pharyngeal mass. Neck CT with contrast on 11/03/2019 revealed no evidence of recurrent tumor or adenopathy. There were post radiation changes in the anterior neck and larynx.  PET scan on 02/04/2019 revealed prior left level II cervical node had been excised. There were no findings on PET to suggest a site of primary head/neck cancer. There was symmetric hypermetabolism along the base of the tongue bilaterally favored to be physiologic. There was no evidence of metastatic disease.   Audiogramon 03/11/2019 revealed normal hearing in the right earthrough 8 kHz with mild hearing loss at 12 kHz only. Hearing was within normal limits  in the left earthrough 3 kHz with mild-severe hearing loss from 4-12 kHz. Audiogramon 04/30/2019 revealed significant decrease in high-frequency hearing. 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  She received radiation from 03/30/2019 - 05/25/2019. She is s/p 2 cycles of cisplatin100 mg/m2 (03/30/2019 - 04/20/2019). She received weeklycisplatin 30 mg/m2x2 (04/06/2021and 05/18/2019). She received filgrastim-sndz support on 05/12/2019 and 05/13/2019.   Left lower extremity duplex on 07/14/2019 revealed no evidence of DVT.  Chest CT angiogram on 07/27/2019 revealed no definite evidence of pulmonary embolus.  There was a stable 5 mm linear density in the anterior portion of the left lower lobe. Follow-up CT scan in 12 months was recommended to ensure stability.  She was admitted to [facility] 04/02/2019 -04/06/2019 with cellulitis involving the trapezius and right supraclavicular area. Port-a-cath was uninvolved. She received cefepime and vancomycin. She was discharged on doxycycline.  She was admitted to [facility] 05/02/2019 to 05/05/2019 with fever of 100.5 and radiation mucositis. Patient was unable to eat or drink fluids secondary to sore throat. Radiation was held. She was started on Decadron 4 mg BID.   HPI: Patient returns to clinic for reevaluation and discussion of results of CT scan.  She was seen by Connie Casa, NP and complained of persistent left oral lesion.  Describes as mildly uncomfortable.  Mild left ear pain.  Symptoms did not improve and she underwent a CT scan.  Today, symptoms persist and are unchanged since onset.  Lesion has been present for approximately 6 weeks.  Otherwise she continues to feel well and denies other specific complaints.  No history of seasonal allergies.  No difficulty swallowing.  No ear drainage or pain.   Past Medical History:  Diagnosis Date  . Throat cancer ([place]) 2021    Past Surgical History:   Procedure Laterality Date  . COLONOSCOPY WITH PROPOFOL N/A 03/30/2020   Procedure: COLONOSCOPY WITH PROPOFOL;  Surgeon: Virgel Manifold, MD;  Location: [facility];  Service: Endoscopy;  Laterality: N/A;  COVID POSITIVE 02/29/2020  . ESOPHAGOGASTRODUODENOSCOPY (EGD) WITH PROPOFOL N/A 03/30/2020   Procedure: ESOPHAGOGASTRODUODENOSCOPY (EGD) WITH PROPOFOL;  Surgeon: Virgel Manifold, MD;  Location: [facility] END[facility];  Service: Endoscopy;  Laterality: N/A;  . EXCISION MASS NECK Left 01/18/2019   Procedure: EXCISION MASS NECK/NODE;  Surgeon: Margaretha Sheffield, MD;  Location: [facility];  Service: ENT;  Laterality: Left;  . LARYNGOSCOPY Bilateral 02/22/2019   Procedure: MICROSCOPIC DIRECT LARYNGOSCOPY AND BIOPSY;  Surgeon: Margaretha Sheffield, MD;  Location: [facility];  Service: ENT;  Laterality: Bilateral;  . PORTA CATH INSERTION N/A 03/24/2019   Procedure: PORTA CATH INSERTION;  Surgeon: Katha Cabal, MD;  Location: [facility];  Service: Cardiovascular;  Laterality: N/A;  . TONSILLECTOMY    . TUBAL LIGATION      Family History  Problem Relation Age of Onset  . Breast cancer Sister   . Brain cancer Paternal Grandfather    Social History:  reports that she has never smoked. She has never used smokeless tobacco. She reports previous alcohol use. She reports that she does not use drugs. She use to drink years ago. She denies any exposure to radiation or toxins. 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 She lives in [place]. The patient is alone today.  Allergies: No Known Allergies  Current Medications: Current Outpatient Medications  Medication Sig Dispense Refill  . gabapentin (NEURONTIN) 100 MG capsule Take by mouth.    . hydrOXYzine (ATARAX/VISTARIL) 25 MG tablet Take 25 mg by mouth daily.    . sertraline (ZOLOFT) 25 MG tablet Take 25 mg by mouth daily.    . Na Sulfate-K Sulfate-Mg Sulf 17.5-3.13-1.6 GM/177ML SOLN At 5 PM the day before procedure  take 1 bottle and 5 hours before procedure take 1 bottle. (Patient not taking: Reported on 06/15/2020) 354 mL 0  . omeprazole (PRILOSEC) 20 MG capsule Take 1 capsule (20 mg total) by mouth daily. 30 capsule 0   No current facility-administered medications for this visit.   Review of Systems  Constitutional: Positive for malaise/fatigue. Negative for chills, fever and weight loss.  HENT: Positive for tinnitus. Negative for congestion and ear pain.        Small white lesion noted in back of mouth  Eyes: Negative.  Negative for blurred vision and double vision.  Respiratory: Negative.  Negative for cough, sputum production and shortness of breath.   Cardiovascular: Negative.  Negative for chest pain, palpitations and leg swelling.  Gastrointestinal: Negative.  Negative for abdominal pain, constipation, diarrhea, nausea and vomiting.  Genitourinary: Negative for dysuria, frequency and urgency.  Musculoskeletal: Negative for back pain and falls.  Skin: Negative.  Negative for rash.  Neurological: Positive for weakness. Negative for headaches.  Endo/Heme/Allergies: Negative.  Does not bruise/bleed easily.  Psychiatric/Behavioral: Negative.  Negative for depression. The patient is not nervous/anxious and does not have insomnia.    Performance status (ECOG): 1  Vitals Blood pressure 129/81, pulse 88, temperature 97.8 F (36.6 C), resp. rate 20, weight 146 lb 15 oz (66.7 kg), SpO2 100 %.   Physical Exam Constitutional:      Appearance: She  is not ill-appearing.  HENT:     Right Ear: Hearing, tympanic membrane, ear canal and external ear normal.     Left Ear: Hearing, ear canal and external ear normal. Tympanic membrane is scarred.     Mouth/Throat:     Lips: Pink. No lesions.     Mouth: Mucous membranes are moist. Oral lesions (~ 3 mm oral lesion on left oropharynx. surrounding erythema. ) present.     Tongue: No lesions.   Musculoskeletal:     Cervical back: Neck supple. No tenderness.   Lymphadenopathy:     Cervical: No cervical adenopathy.  Skin:    General: Skin is warm and dry.  Neurological:     Mental Status: She is alert and oriented to person, place, and time.  Psychiatric:        Mood and Affect: Mood normal.        Behavior: Behavior normal.    Assessment:  Connie Cameron is a 60 y.o. 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 Pathologyrevealed poorly differentiated squamous cell carcinoma. Malignant cells are positive for p16, p40, and pancytokeratin. She received radiation from 03/30/2019 - 05/25/2019. She is s/p 2 cycles of cisplatin100 mg/m2 (03/30/2019 - 04/20/2019).  She received weekly cisplatin 30 mg/m2 x2 (05/11/2019 and 05/18/2019).  She received filgrastim-sndz support on 05/12/2019 and 05/13/2019. No evidence of disease since she completed treatment. Oral lesion.   Plan: 1.  Oral Lesion with hx of stage I left base of tongue carcinoma  - unresolved x 6 weeks.  - CT images reviewed today with no discrete mass visible  - etiology unclear - sinus drainage? Infection? Others?  - if no improvement or if symptoms worsen, recommend eval with ENT for direct visualization, possible biopsy.   Plan: -follow up with ENT for direct visualization of lesion if no improvement or worsening symptoms.  - Return to [facility] for follow up as scheduled.   I discussed the assessment and treatment plan with the patient. The patient was provided an opportunity to ask questions and all were answered. The patient agreed with the plan and demonstrated an understanding of the instructions. The patient was advised to call back if the symptoms worsen or if the condition fails to improve as anticipated.  Beckey Rutter, DNP, AGNP-C [place] [facility] [phone] (clinic)

## 2020-06-26 ENCOUNTER — Other Ambulatory Visit: Payer: Self-pay

## 2020-06-26 ENCOUNTER — Emergency Department: Payer: Medicaid Other

## 2020-06-26 ENCOUNTER — Inpatient Hospital Stay
Admission: EM | Admit: 2020-06-26 | Discharge: 2020-06-30 | DRG: 149 | Disposition: A | Discharge status: Home / Self Care | Payer: Medicaid Other | Attending: Internal Medicine | Admitting: Internal Medicine

## 2020-06-26 ENCOUNTER — Encounter: Payer: Self-pay | Admitting: Emergency Medicine

## 2020-06-26 DIAGNOSIS — K219 Gastro-esophageal reflux disease without esophagitis: Secondary | ICD-10-CM | POA: Diagnosis not present

## 2020-06-26 DIAGNOSIS — Z888 Allergy status to other drugs, medicaments and biological substances status: Secondary | ICD-10-CM

## 2020-06-26 DIAGNOSIS — Z808 Family history of malignant neoplasm of other organs or systems: Secondary | ICD-10-CM

## 2020-06-26 DIAGNOSIS — F418 Other specified anxiety disorders: Secondary | ICD-10-CM

## 2020-06-26 DIAGNOSIS — Z803 Family history of malignant neoplasm of breast: Secondary | ICD-10-CM

## 2020-06-26 DIAGNOSIS — C01 Malignant neoplasm of base of tongue: Secondary | ICD-10-CM

## 2020-06-26 DIAGNOSIS — Z923 Personal history of irradiation: Secondary | ICD-10-CM

## 2020-06-26 DIAGNOSIS — Z20822 Contact with and (suspected) exposure to covid-19: Secondary | ICD-10-CM | POA: Diagnosis present

## 2020-06-26 DIAGNOSIS — Z85810 Personal history of malignant neoplasm of tongue: Secondary | ICD-10-CM

## 2020-06-26 DIAGNOSIS — R42 Dizziness and giddiness: Secondary | ICD-10-CM | POA: Diagnosis not present

## 2020-06-26 DIAGNOSIS — Z79899 Other long term (current) drug therapy: Secondary | ICD-10-CM

## 2020-06-26 DIAGNOSIS — Z9221 Personal history of antineoplastic chemotherapy: Secondary | ICD-10-CM

## 2020-06-26 DIAGNOSIS — F32A Depression, unspecified: Secondary | ICD-10-CM | POA: Diagnosis present

## 2020-06-26 DIAGNOSIS — F419 Anxiety disorder, unspecified: Secondary | ICD-10-CM | POA: Diagnosis present

## 2020-06-26 DIAGNOSIS — R61 Generalized hyperhidrosis: Secondary | ICD-10-CM | POA: Diagnosis present

## 2020-06-26 DIAGNOSIS — H8110 Benign paroxysmal vertigo, unspecified ear: Principal | ICD-10-CM | POA: Diagnosis present

## 2020-06-26 LAB — CBC WITH DIFFERENTIAL/PLATELET
Abs Immature Granulocytes: 0.03 10*3/uL (ref 0.00–0.07)
Basophils Absolute: 0.1 10*3/uL (ref 0.0–0.1)
Basophils Relative: 1 %
Eosinophils Absolute: 0.2 10*3/uL (ref 0.0–0.5)
Eosinophils Relative: 2 %
HCT: 37.5 % (ref 36.0–46.0)
Hemoglobin: 12.7 g/dL (ref 12.0–15.0)
Immature Granulocytes: 0 %
Lymphocytes Relative: 37 %
Lymphs Abs: 2.6 10*3/uL (ref 0.7–4.0)
MCH: 30.8 pg (ref 26.0–34.0)
MCHC: 33.9 g/dL (ref 30.0–36.0)
MCV: 91 fL (ref 80.0–100.0)
Monocytes Absolute: 0.6 10*3/uL (ref 0.1–1.0)
Monocytes Relative: 9 %
Neutro Abs: 3.6 10*3/uL (ref 1.7–7.7)
Neutrophils Relative %: 51 %
Platelets: 246 10*3/uL (ref 150–400)
RBC: 4.12 MIL/uL (ref 3.87–5.11)
RDW: 12.9 % (ref 11.5–15.5)
WBC: 7 10*3/uL (ref 4.0–10.5)
nRBC: 0 % (ref 0.0–0.2)

## 2020-06-26 LAB — BASIC METABOLIC PANEL
Anion gap: 11 (ref 5–15)
BUN: 30 mg/dL — ABNORMAL HIGH (ref 6–20)
CO2: 22 mmol/L (ref 22–32)
Calcium: 9.5 mg/dL (ref 8.9–10.3)
Chloride: 107 mmol/L (ref 98–111)
Creatinine, Ser: 1 mg/dL (ref 0.44–1.00)
GFR, Estimated: >60 mL/min (ref >60)
Glucose, Bld: 212 mg/dL — ABNORMAL HIGH (ref 70–99)
Potassium: 3.9 mmol/L (ref 3.5–5.1)
Sodium: 140 mmol/L (ref 135–145)

## 2020-06-26 LAB — URINALYSIS, COMPLETE (UACMP) WITH MICROSCOPIC
Bilirubin Urine: NEGATIVE
Glucose, UA: NEGATIVE mg/dL
Hgb urine dipstick: NEGATIVE
Ketones, ur: NEGATIVE mg/dL
Nitrite: NEGATIVE
Protein, ur: NEGATIVE mg/dL
Specific Gravity, Urine: 1.012 (ref 1.005–1.030)
pH: 6 (ref 5.0–8.0)

## 2020-06-26 LAB — RESP PANEL BY RT-PCR (FLU A&B, COVID) ARPGX2
Influenza A by PCR: NEGATIVE
Influenza B by PCR: NEGATIVE
SARS Coronavirus 2 by RT PCR: NEGATIVE

## 2020-06-26 LAB — CBG MONITORING, ED: Glucose-Capillary: 175 mg/dL — ABNORMAL HIGH (ref 70–99)

## 2020-06-26 MED ORDER — ONDANSETRON HCL 4 MG/2ML IJ SOLN
4.0000 mg | INTRAMUSCULAR | Status: AC
  Administered 2020-06-26: 4 mg via INTRAVENOUS
  Filled 2020-06-26: qty 2

## 2020-06-26 MED ORDER — PANTOPRAZOLE SODIUM 40 MG PO TBEC: 40.0000 mg | DELAYED_RELEASE_TABLET | Freq: Every day | ORAL | Status: DC | PRN

## 2020-06-26 MED ORDER — ONDANSETRON HCL 4 MG/2ML IJ SOLN
INTRAMUSCULAR | Status: AC
  Filled 2020-06-26: qty 2

## 2020-06-26 MED ORDER — ACETAMINOPHEN 325 MG PO TABS
650.0000 mg | ORAL_TABLET | Freq: Four times a day (QID) | ORAL | Status: DC | PRN
  Administered 2020-06-26 – 2020-06-27 (×2): 650 mg via ORAL
  Filled 2020-06-26 (×2): qty 2

## 2020-06-26 MED ORDER — SODIUM CHLORIDE 0.9 % IV BOLUS (SEPSIS)
1000.0000 mL | Freq: Once | INTRAVENOUS | Status: AC
  Administered 2020-06-26: 1000 mL via INTRAVENOUS

## 2020-06-26 MED ORDER — GADOBUTROL 1 MMOL/ML IV SOLN
7.0000 mL | Freq: Once | INTRAVENOUS | Status: AC | PRN
  Administered 2020-06-26: 7 mL via INTRAVENOUS

## 2020-06-26 MED ORDER — HYDROXYZINE HCL 25 MG PO TABS
25.0000 mg | ORAL_TABLET | Freq: Every day | ORAL | Status: DC | PRN
  Administered 2020-06-27 – 2020-06-29 (×3): 25 mg via ORAL
  Filled 2020-06-26 (×5): qty 1

## 2020-06-26 MED ORDER — MECLIZINE HCL 25 MG PO TABS: 25.0000 mg | ORAL_TABLET | Freq: Three times a day (TID) | ORAL | 1 refills | Status: DC | PRN

## 2020-06-26 MED ORDER — MECLIZINE HCL 25 MG PO TABS
25.0000 mg | ORAL_TABLET | Freq: Three times a day (TID) | ORAL | Status: DC | PRN
  Filled 2020-06-26: qty 1

## 2020-06-26 MED ORDER — ONDANSETRON HCL 4 MG/2ML IJ SOLN
4.0000 mg | Freq: Three times a day (TID) | INTRAMUSCULAR | Status: DC | PRN
  Administered 2020-06-27 – 2020-06-29 (×4): 4 mg via INTRAVENOUS
  Filled 2020-06-26 (×5): qty 2

## 2020-06-26 MED ORDER — ONDANSETRON 4 MG PO TBDP: 4.0000 mg | ORAL_TABLET | Freq: Four times a day (QID) | ORAL | 0 refills | Status: DC | PRN

## 2020-06-26 MED ORDER — ENOXAPARIN SODIUM 40 MG/0.4ML IJ SOSY
40.0000 mg | PREFILLED_SYRINGE | INTRAMUSCULAR | Status: DC
  Administered 2020-06-26 – 2020-06-29 (×4): 40 mg via SUBCUTANEOUS
  Filled 2020-06-26 (×4): qty 0.4

## 2020-06-26 MED ORDER — SERTRALINE HCL 50 MG PO TABS: 25.0000 mg | ORAL_TABLET | Freq: Every day | ORAL | Status: DC | PRN

## 2020-06-26 MED ORDER — DIAZEPAM 5 MG PO TABS
5.0000 mg | ORAL_TABLET | Freq: Once | ORAL | Status: AC
  Administered 2020-06-26: 5 mg via ORAL
  Filled 2020-06-26: qty 1

## 2020-06-26 MED ORDER — SODIUM CHLORIDE 0.9 % IV SOLN: INTRAVENOUS | Status: DC

## 2020-06-26 MED ORDER — GABAPENTIN 100 MG PO CAPS: 100.0000 mg | ORAL_CAPSULE | Freq: Every day | ORAL | Status: DC | PRN

## 2020-06-26 MED ORDER — MECLIZINE HCL 25 MG PO TABS
50.0000 mg | ORAL_TABLET | Freq: Once | ORAL | Status: AC
  Administered 2020-06-26: 50 mg via ORAL
  Filled 2020-06-26: qty 2

## 2020-06-26 NOTE — ED Triage Notes (Signed)
Pt called  [facility] this morning with a sudden onset of N/V . Pt states that she didn't eat anything different . Marland Kitchen

## 2020-06-26 NOTE — H&P (Signed)
History and Physical    Connie Cameron [id number] DOB: Jul 23, 1960 DOA: 06/26/2020  Referring MD/NP/PA:   PCP: [place]   Patient coming from:  The patient is coming from home.  At baseline, pt is independent for most of ADL.        Chief Complaint: vertigo  HPI: Connie Cameron is a 60 y.o. 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  Patient states that her symptoms started this morning.  She has vertigo, dizziness, feeling room spinning around her.  Her dizziness is worse when she turns her head to the left.  No hearing loss.  No unilateral weakness.  No facial droop or slurred speech.  Associated with nausea and vomiting, but no abdominal pain or diarrhea.  Patient states she has had more than 5 times of nonbilious nonbloody vomiting.  No chest pain, cough, shortness breath, fever or chills.  No symptoms of UTI.   ED Course: pt was found to have WBC 7.0, pending COVID-19 PCR, electrolytes renal function okay, temperature 97.3, blood pressure 117/76, heart rate 57, RR 31, oxygen saturation 100% on room air.  MRI for brain negative.  MRA of head and neck negative.  Patient is placed on MedSurg bed for position.  Review of Systems:   General: no fevers, chills, no body weight gain, has poor appetite, has fatigue HEENT: no blurry vision, hearing changes or sore throat Respiratory: no dyspnea, coughing, wheezing CV: no chest pain, no palpitations GI: Has nausea, vomiting, no abdominal pain, diarrhea, constipation GU: no dysuria, burning on urination, increased urinary frequency, hematuria  Ext: no leg edema Neuro: no unilateral weakness, numbness, or tingling, no vision change or hearing loss.  Has vertigo Skin: no rash, no skin tear. MSK: No muscle spasm, no deformity, no limitation of range of movement in  spin Heme: No easy bruising.  Travel history: No recent long distant travel.  Allergy: No Known Allergies  Past Medical History:  Diagnosis Date  . Throat cancer ([place]) 2021    Past Surgical History:  Procedure Laterality Date  . COLONOSCOPY WITH PROPOFOL N/A 03/30/2020   Procedure: COLONOSCOPY WITH PROPOFOL;  Surgeon: Virgel Manifold, MD;  Location: [facility];  Service: Endoscopy;  Laterality: N/A;  COVID POSITIVE 02/29/2020  . ESOPHAGOGASTRODUODENOSCOPY (EGD) WITH PROPOFOL N/A 03/30/2020   Procedure: ESOPHAGOGASTRODUODENOSCOPY (EGD) WITH PROPOFOL;  Surgeon: Virgel Manifold, MD;  Location: [facility] END[facility];  Service: Endoscopy;  Laterality: N/A;  . EXCISION MASS NECK Left 01/18/2019   Procedure: EXCISION MASS NECK/NODE;  Surgeon: Margaretha Sheffield, MD;  Location: [facility];  Service: ENT;  Laterality: Left;  . LARYNGOSCOPY Bilateral 02/22/2019   Procedure: MICROSCOPIC DIRECT LARYNGOSCOPY AND BIOPSY;  Surgeon: Margaretha Sheffield, MD;  Location: [facility];  Service: ENT;  Laterality: Bilateral;  . PORTA CATH INSERTION N/A 03/24/2019   Procedure: PORTA CATH INSERTION;  Surgeon: Katha Cabal, MD;  Location: [facility];  Service: Cardiovascular;  Laterality: N/A;  . TONSILLECTOMY    . TUBAL LIGATION      Social History:  reports that she has never smoked. She has never used smokeless tobacco. She reports previous alcohol use. She reports that she does not use drugs.  Family History:  Family History  Problem Relation Age of Onset  . Breast cancer Sister   . Brain cancer Paternal Grandfather      Prior to  Admission medications   Medication Sig Start Date End Date Taking? Authorizing Provider  meclizine (ANTIVERT) 25 MG tablet Take 1-2 tablets (25-50 mg total) by mouth 3 (three) times daily as needed for dizziness. 06/26/20  Yes Ward, Cyril Mourning N, DO  ondansetron (ZOFRAN ODT) 4 MG disintegrating tablet Take 1 tablet (4 mg total) by mouth every 6 (six) hours as needed for  nausea or vomiting. 06/26/20  Yes Ward, Cyril Mourning N, DO  gabapentin (NEURONTIN) 100 MG capsule Take by mouth. 06/15/20   [provider]  hydrOXYzine (ATARAX/VISTARIL) 25 MG tablet Take 25 mg by mouth daily. 06/15/20   [provider]  Na Sulfate-K Sulfate-Mg Sulf 17.5-3.13-1.6 GM/177ML SOLN At 5 PM the day before procedure take 1 bottle and 5 hours before procedure take 1 bottle. Patient not taking: Reported on 06/15/2020 02/23/20   Virgel Manifold, MD  omeprazole (PRILOSEC) 20 MG capsule Take 1 capsule (20 mg total) by mouth daily. 03/30/20 04/29/20  Virgel Manifold, MD  sertraline (ZOLOFT) 25 MG tablet Take 25 mg by mouth daily. 06/15/20   [provider]    Physical Exam: Vitals:   06/26/20 1730 06/26/20 1800 06/26/20 1858 06/26/20 1937  BP: 100/74 127/88 139/88 (!) 146/78  Pulse: 70 96 66 69  Resp: 13 16  20   Temp:   98.2 F (36.8 C) 98 F (36.7 C)  TempSrc:   Oral Oral  SpO2: 96% 98% 100% 99%  Weight:      Height:       General: Not in acute distress HEENT:       Eyes: PERRL, EOMI, no scleral icterus.       ENT: No discharge from the ears and nose, no pharynx injection, no tonsillar enlargement.        Neck: No JVD, no bruit, no mass felt. Heme: No neck lymph node enlargement. Cardiac: S1/S2, RRR, No murmurs, No gallops or rubs. Respiratory: No rales, wheezing, rhonchi or rubs. GI: Soft, nondistended, nontender, no rebound pain, no organomegaly, BS present. GU: No hematuria Ext: No pitting leg edema bilaterally. 1+DP/PT pulse bilaterally. Musculoskeletal: No joint deformities, No joint redness or warmth, no limitation of ROM in spin. Skin: No rashes.  Neuro: Alert, oriented X3, cranial nerves II-XII grossly intact, moves all extremities normally.  Psych: Patient is not psychotic, no suicidal or hemocidal ideation.  Labs on Admission: I have personally reviewed following labs and imaging studies  CBC: Recent Labs  Lab 06/26/20 0505  WBC 7.0   NEUTROABS 3.6  HGB 12.7  HCT 37.5  MCV 91.0  PLT 130   Basic Metabolic Panel: Recent Labs  Lab 06/26/20 0505  NA 140  K 3.9  CL 107  CO2 22  GLUCOSE 212*  BUN 30*  CREATININE 1.00  CALCIUM 9.5   GFR: Estimated Creatinine Clearance: 52 mL/min (by C-G formula based on SCr of 1 mg/dL). Liver Function Tests: No results for input(s): AST, ALT, ALKPHOS, BILITOT, PROT, ALBUMIN in the last 168 hours. No results for input(s): LIPASE, AMYLASE in the last 168 hours. No results for input(s): AMMONIA in the last 168 hours. Coagulation Profile: No results for input(s): INR, PROTIME in the last 168 hours. Cardiac Enzymes: No results for input(s): CKTOTAL, CKMB, CKMBINDEX, TROPONINI in the last 168 hours. BNP (last 3 results) No results for input(s): PROBNP in the last 8760 hours. HbA1C: No results for input(s): HGBA1C in the last 72 hours. CBG: Recent Labs  Lab 06/26/20 0505  GLUCAP 175*   Lipid Profile:  No results for input(s): CHOL, HDL, LDLCALC, TRIG, CHOLHDL, LDLDIRECT in the last 72 hours. Thyroid Function Tests: No results for input(s): TSH, T4TOTAL, FREET4, T3FREE, THYROIDAB in the last 72 hours. Anemia Panel: No results for input(s): VITAMINB12, FOLATE, FERRITIN, TIBC, IRON, RETICCTPCT in the last 72 hours. Urine analysis:    Component Value Date/Time   COLORURINE YELLOW (A) 06/26/2020 1359   APPEARANCEUR CLEAR (A) 06/26/2020 1359   APPEARANCEUR Hazy 06/24/2011 1709   LABSPEC 1.012 06/26/2020 1359   LABSPEC 1.010 06/24/2011 1709   PHURINE 6.0 06/26/2020 1359   GLUCOSEU NEGATIVE 06/26/2020 1359   GLUCOSEU Negative 06/24/2011 1709   HGBUR NEGATIVE 06/26/2020 1359   BILIRUBINUR NEGATIVE 06/26/2020 1359   BILIRUBINUR Negative 06/24/2011 1709   KETONESUR NEGATIVE 06/26/2020 1359   PROTEINUR NEGATIVE 06/26/2020 1359   NITRITE NEGATIVE 06/26/2020 1359   LEUKOCYTESUR MODERATE (A) 06/26/2020 1359   LEUKOCYTESUR 1+ 06/24/2011 1709   Sepsis  Labs: @LABRCNTIP (procalcitonin:4,lacticidven:4) ) Recent Results (from the past 240 hour(s))  Resp Panel by RT-PCR (Flu A&B, Covid) Nasopharyngeal Swab     Status: None   Collection Time: 06/26/20  3:17 PM   Specimen: Nasopharyngeal Swab; Nasopharyngeal(NP) swabs in vial transport medium  Result Value Ref Range Status   SARS Coronavirus 2 by RT PCR NEGATIVE NEGATIVE Final    Comment: (NOTE) SARS-CoV-2 target nucleic acids are NOT DETECTED.  The SARS-CoV-2 RNA is generally detectable in upper respiratory specimens during the acute phase of infection. The lowest concentration of SARS-CoV-2 viral copies this assay can detect is 138 copies/mL. A negative result does not preclude SARS-Cov-2 infection and should not be used as the sole basis for treatment or other patient management decisions. A negative result may occur with  improper specimen collection/handling, submission of specimen other than nasopharyngeal swab, presence of viral mutation(s) within the areas targeted by this assay, and inadequate number of viral copies(<138 copies/mL). A negative result must be combined with clinical observations, patient history, and epidemiological information. The expected result is Negative.  Fact Sheet for Patients:  EntrepreneurPulse.com.au  Fact Sheet for Healthcare Providers:  IncredibleEmployment.be  This test is no t yet approved or cleared by the [place] FDA and  has been authorized for detection and/or diagnosis of SARS-CoV-2 by FDA under an Emergency Use Authorization (EUA). This EUA will remain  in effect (meaning this test can be used) for the duration of the COVID-19 declaration under Section 564(b)(1) of the Act, 21 U.S.C.section 360bbb-3(b)(1), unless the authorization is terminated  or revoked sooner.       Influenza A by PCR NEGATIVE NEGATIVE Final   Influenza B by PCR NEGATIVE NEGATIVE Final    Comment: (NOTE) The Xpert Xpress  SARS-CoV-2/FLU/RSV plus assay is intended as an aid in the diagnosis of influenza from Nasopharyngeal swab specimens and should not be used as a sole basis for treatment. Nasal washings and aspirates are unacceptable for Xpert Xpress SARS-CoV-2/FLU/RSV testing.  Fact Sheet for Patients: EntrepreneurPulse.com.au  Fact Sheet for Healthcare Providers: IncredibleEmployment.be  This test is not yet approved or cleared by the [place] FDA and has been authorized for detection and/or diagnosis of SARS-CoV-2 by FDA under an Emergency Use Authorization (EUA). This EUA will remain in effect (meaning this test can be used) for the duration of the COVID-19 declaration under Section 564(b)(1) of the Act, 21 U.S.C. section 360bbb-3(b)(1), unless the authorization is terminated or revoked.  Performed at [facility], [address] [place], [place] [address]      Radiological  Exams on Admission: MR ANGIO HEAD WO CONTRAST  Result Date: 06/26/2020 CLINICAL DATA:  Acute vertigo.  History of head and neck cancer. EXAM: MRI HEAD WITHOUT AND WITH CONTRAST MRA HEAD WITHOUT CONTRAST MRA NECK WITHOUT AND WITH CONTRAST TECHNIQUE: Multiplanar, multiecho pulse sequences of the brain and surrounding structures were obtained without and with intravenous contrast. Angiographic images of the Circle of Willis were obtained using MRA technique without intravenous contrast. Angiographic images of the neck were obtained using MRA technique without and with intravenous contrast. Carotid stenosis measurements (when applicable) are obtained utilizing NASCET criteria, using the distal internal carotid diameter as the denominator. CONTRAST:  60mL GADAVIST GADOBUTROL 1 MMOL/ML IV SOLN COMPARISON:  None. FINDINGS: MRI HEAD FINDINGS Brain: There is no evidence of an acute infarct, intracranial hemorrhage, mass, midline shift, or extra-axial fluid collection. The ventricles and  sulci are normal. Scattered T2 hyperintensities in the cerebral white matter bilaterally are nonspecific but compatible with mild chronic small vessel ischemic disease. No abnormal enhancement is identified. No cerebellar insult is evident. Vascular: Major intracranial vascular flow voids are preserved. Skull and upper cervical spine: Unremarkable bone marrow signal. Sinuses/Orbits: Unremarkable orbits. Paranasal sinuses and mastoid air cells are clear. Other: None. MRA HEAD FINDINGS The intracranial vertebral arteries are widely patent to the basilar. Patent PICAs, AICAs, and SCAs are seen bilaterally with a common origin of the right AICA and PICA noted. The basilar artery is widely patent. Posterior communicating arteries are not identified and may be diminutive or absent. Both PCAs are patent without evidence of a significant proximal stenosis. The internal carotid arteries are widely patent from skull base to carotid termini. ACAs and MCAs are patent without evidence of a proximal branch occlusion or significant proximal stenosis. No aneurysm is identified. MRA NECK FINDINGS There is a standard 3 vessel aortic arch. The brachiocephalic and subclavian arteries are widely patent. The common carotid and cervical internal carotid arteries are patent without evidence of stenosis or dissection. The vertebral arteries are patent and codominant with antegrade flow bilaterally. No vertebral artery dissection or stenosis is evident. IMPRESSION: 1. No acute intracranial abnormality. 2. Mild chronic small vessel ischemic disease. 3. Negative head MRA. 4. Negative neck MRA. Electronically Signed   By: Logan Bores M.D.   On: 06/26/2020 11:55   MR Angiogram Neck W or Wo Contrast  Result Date: 06/26/2020 CLINICAL DATA:  Acute vertigo.  History of head and neck cancer. EXAM: MRI HEAD WITHOUT AND WITH CONTRAST MRA HEAD WITHOUT CONTRAST MRA NECK WITHOUT AND WITH CONTRAST TECHNIQUE: Multiplanar, multiecho pulse sequences of  the brain and surrounding structures were obtained without and with intravenous contrast. Angiographic images of the Circle of Willis were obtained using MRA technique without intravenous contrast. Angiographic images of the neck were obtained using MRA technique without and with intravenous contrast. Carotid stenosis measurements (when applicable) are obtained utilizing NASCET criteria, using the distal internal carotid diameter as the denominator. CONTRAST:  51mL GADAVIST GADOBUTROL 1 MMOL/ML IV SOLN COMPARISON:  None. FINDINGS: MRI HEAD FINDINGS Brain: There is no evidence of an acute infarct, intracranial hemorrhage, mass, midline shift, or extra-axial fluid collection. The ventricles and sulci are normal. Scattered T2 hyperintensities in the cerebral white matter bilaterally are nonspecific but compatible with mild chronic small vessel ischemic disease. No abnormal enhancement is identified. No cerebellar insult is evident. Vascular: Major intracranial vascular flow voids are preserved. Skull and upper cervical spine: Unremarkable bone marrow signal. Sinuses/Orbits: Unremarkable orbits. Paranasal sinuses and mastoid air cells are clear.  Other: None. MRA HEAD FINDINGS The intracranial vertebral arteries are widely patent to the basilar. Patent PICAs, AICAs, and SCAs are seen bilaterally with a common origin of the right AICA and PICA noted. The basilar artery is widely patent. Posterior communicating arteries are not identified and may be diminutive or absent. Both PCAs are patent without evidence of a significant proximal stenosis. The internal carotid arteries are widely patent from skull base to carotid termini. ACAs and MCAs are patent without evidence of a proximal branch occlusion or significant proximal stenosis. No aneurysm is identified. MRA NECK FINDINGS There is a standard 3 vessel aortic arch. The brachiocephalic and subclavian arteries are widely patent. The common carotid and cervical internal  carotid arteries are patent without evidence of stenosis or dissection. The vertebral arteries are patent and codominant with antegrade flow bilaterally. No vertebral artery dissection or stenosis is evident. IMPRESSION: 1. No acute intracranial abnormality. 2. Mild chronic small vessel ischemic disease. 3. Negative head MRA. 4. Negative neck MRA. Electronically Signed   By: Logan Bores M.D.   On: 06/26/2020 11:55   MR BRAIN W WO CONTRAST  Result Date: 06/26/2020 CLINICAL DATA:  Acute vertigo.  History of head and neck cancer. EXAM: MRI HEAD WITHOUT AND WITH CONTRAST MRA HEAD WITHOUT CONTRAST MRA NECK WITHOUT AND WITH CONTRAST TECHNIQUE: Multiplanar, multiecho pulse sequences of the brain and surrounding structures were obtained without and with intravenous contrast. Angiographic images of the Circle of Willis were obtained using MRA technique without intravenous contrast. Angiographic images of the neck were obtained using MRA technique without and with intravenous contrast. Carotid stenosis measurements (when applicable) are obtained utilizing NASCET criteria, using the distal internal carotid diameter as the denominator. CONTRAST:  18mL GADAVIST GADOBUTROL 1 MMOL/ML IV SOLN COMPARISON:  None. FINDINGS: MRI HEAD FINDINGS Brain: There is no evidence of an acute infarct, intracranial hemorrhage, mass, midline shift, or extra-axial fluid collection. The ventricles and sulci are normal. Scattered T2 hyperintensities in the cerebral white matter bilaterally are nonspecific but compatible with mild chronic small vessel ischemic disease. No abnormal enhancement is identified. No cerebellar insult is evident. Vascular: Major intracranial vascular flow voids are preserved. Skull and upper cervical spine: Unremarkable bone marrow signal. Sinuses/Orbits: Unremarkable orbits. Paranasal sinuses and mastoid air cells are clear. Other: None. MRA HEAD FINDINGS The intracranial vertebral arteries are widely patent to the  basilar. Patent PICAs, AICAs, and SCAs are seen bilaterally with a common origin of the right AICA and PICA noted. The basilar artery is widely patent. Posterior communicating arteries are not identified and may be diminutive or absent. Both PCAs are patent without evidence of a significant proximal stenosis. The internal carotid arteries are widely patent from skull base to carotid termini. ACAs and MCAs are patent without evidence of a proximal branch occlusion or significant proximal stenosis. No aneurysm is identified. MRA NECK FINDINGS There is a standard 3 vessel aortic arch. The brachiocephalic and subclavian arteries are widely patent. The common carotid and cervical internal carotid arteries are patent without evidence of stenosis or dissection. The vertebral arteries are patent and codominant with antegrade flow bilaterally. No vertebral artery dissection or stenosis is evident. IMPRESSION: 1. No acute intracranial abnormality. 2. Mild chronic small vessel ischemic disease. 3. Negative head MRA. 4. Negative neck MRA. Electronically Signed   By: Logan Bores M.D.   On: 06/26/2020 11:55     EKG: I have personally reviewed.  Sinus rhythm, QTC 435, bradycardia, low voltage, nonspecific T wave change  Assessment/Plan  Principal Problem:   Vertigo Active Problems:   Carcinoma of base of tongue (HCC)   GERD (gastroesophageal reflux disease)   Depression with anxiety   Vertigo: MRI of brain negative.  MRA of head and neck negative. Differential diagnoses include Mnire's disease (less likely given no tinnitus and hearing loss), benign paroxysmal positional vertigo (possible, patient reports worsening dizziness on position change), vestibular neuritis (less likely, patient denies recent viral illness).  -place in med-surg bed for obs -will treat symptomatically with Zofran for nausea and meclizine for dizziness  -IVF: 1L NS, then 75 cc/h  -EKG, troponinX1 and 2D-Echo  -PT/OT  Carcinoma of  base of tongue [facility]): S/p of surgery, radiation and chemotherapy -f/u with Dr. Mike Gip  GERD (gastroesophageal reflux disease) -Protonix  Depression with anxiety -Continue home medications         DVT ppx: SQ Lovenox Code Status: Full code Family Communication: not done, no family member is at bed side.    Disposition Plan:  Anticipate discharge back to previous environment Consults called: None Admission status and Level of care: Med-Surg:   for obs  Status is: Observation  The patient remains OBS appropriate and will d/c before 2 midnights.  Dispo: The patient is from: Home              Anticipated d/c is to: Home              Patient currently is not medically stable to d/c.   Difficult to place patient No           Date of Service 06/26/2020    [employer] [facility]   If 7PM-7AM, please contact night-coverage [email or web address] 06/26/2020, 8:16 PM

## 2020-06-26 NOTE — ED Notes (Signed)
Pt speaking to MRI at this time

## 2020-06-26 NOTE — ED Provider Notes (Signed)
MR ANGIO HEAD WO CONTRAST  Result Date: 06/26/2020 CLINICAL DATA:  Acute vertigo.  History of head and neck cancer. EXAM: MRI HEAD WITHOUT AND WITH CONTRAST MRA HEAD WITHOUT CONTRAST MRA NECK WITHOUT AND WITH CONTRAST TECHNIQUE: Multiplanar, multiecho pulse sequences of the brain and surrounding structures were obtained without and with intravenous contrast. Angiographic images of the Circle of Willis were obtained using MRA technique without intravenous contrast. Angiographic images of the neck were obtained using MRA technique without and with intravenous contrast. Carotid stenosis measurements (when applicable) are obtained utilizing NASCET criteria, using the distal internal carotid diameter as the denominator. CONTRAST:  16mL GADAVIST GADOBUTROL 1 MMOL/ML IV SOLN COMPARISON:  None. FINDINGS: MRI HEAD FINDINGS Brain: There is no evidence of an acute infarct, intracranial hemorrhage, mass, midline shift, or extra-axial fluid collection. The ventricles and sulci are normal. Scattered T2 hyperintensities in the cerebral white matter bilaterally are nonspecific but compatible with mild chronic small vessel ischemic disease. No abnormal enhancement is identified. No cerebellar insult is evident. Vascular: Major intracranial vascular flow voids are preserved. Skull and upper cervical spine: Unremarkable bone marrow signal. Sinuses/Orbits: Unremarkable orbits. Paranasal sinuses and mastoid air cells are clear. Other: None. MRA HEAD FINDINGS The intracranial vertebral arteries are widely patent to the basilar. Patent PICAs, AICAs, and SCAs are seen bilaterally with a common origin of the right AICA and PICA noted. The basilar artery is widely patent. Posterior communicating arteries are not identified and may be diminutive or absent. Both PCAs are patent without evidence of a significant proximal stenosis. The internal carotid arteries are widely patent from skull base to carotid termini. ACAs and MCAs are patent  without evidence of a proximal branch occlusion or significant proximal stenosis. No aneurysm is identified. MRA NECK FINDINGS There is a standard 3 vessel aortic arch. The brachiocephalic and subclavian arteries are widely patent. The common carotid and cervical internal carotid arteries are patent without evidence of stenosis or dissection. The vertebral arteries are patent and codominant with antegrade flow bilaterally. No vertebral artery dissection or stenosis is evident. IMPRESSION: 1. No acute intracranial abnormality. 2. Mild chronic small vessel ischemic disease. 3. Negative head MRA. 4. Negative neck MRA. Electronically Signed   By: Logan Bores M.D.   On: 06/26/2020 11:55   MR Angiogram Neck W or Wo Contrast  Result Date: 06/26/2020 CLINICAL DATA:  Acute vertigo.  History of head and neck cancer. EXAM: MRI HEAD WITHOUT AND WITH CONTRAST MRA HEAD WITHOUT CONTRAST MRA NECK WITHOUT AND WITH CONTRAST TECHNIQUE: Multiplanar, multiecho pulse sequences of the brain and surrounding structures were obtained without and with intravenous contrast. Angiographic images of the Circle of Willis were obtained using MRA technique without intravenous contrast. Angiographic images of the neck were obtained using MRA technique without and with intravenous contrast. Carotid stenosis measurements (when applicable) are obtained utilizing NASCET criteria, using the distal internal carotid diameter as the denominator. CONTRAST:  96mL GADAVIST GADOBUTROL 1 MMOL/ML IV SOLN COMPARISON:  None. FINDINGS: MRI HEAD FINDINGS Brain: There is no evidence of an acute infarct, intracranial hemorrhage, mass, midline shift, or extra-axial fluid collection. The ventricles and sulci are normal. Scattered T2 hyperintensities in the cerebral white matter bilaterally are nonspecific but compatible with mild chronic small vessel ischemic disease. No abnormal enhancement is identified. No cerebellar insult is evident. Vascular: Major intracranial  vascular flow voids are preserved. Skull and upper cervical spine: Unremarkable bone marrow signal. Sinuses/Orbits: Unremarkable orbits. Paranasal sinuses and mastoid air cells are clear. Other: None. MRA  HEAD FINDINGS The intracranial vertebral arteries are widely patent to the basilar. Patent PICAs, AICAs, and SCAs are seen bilaterally with a common origin of the right AICA and PICA noted. The basilar artery is widely patent. Posterior communicating arteries are not identified and may be diminutive or absent. Both PCAs are patent without evidence of a significant proximal stenosis. The internal carotid arteries are widely patent from skull base to carotid termini. ACAs and MCAs are patent without evidence of a proximal branch occlusion or significant proximal stenosis. No aneurysm is identified. MRA NECK FINDINGS There is a standard 3 vessel aortic arch. The brachiocephalic and subclavian arteries are widely patent. The common carotid and cervical internal carotid arteries are patent without evidence of stenosis or dissection. The vertebral arteries are patent and codominant with antegrade flow bilaterally. No vertebral artery dissection or stenosis is evident. IMPRESSION: 1. No acute intracranial abnormality. 2. Mild chronic small vessel ischemic disease. 3. Negative head MRA. 4. Negative neck MRA. Electronically Signed   By: Logan Bores M.D.   On: 06/26/2020 11:55   MR BRAIN W WO CONTRAST  Result Date: 06/26/2020 CLINICAL DATA:  Acute vertigo.  History of head and neck cancer. EXAM: MRI HEAD WITHOUT AND WITH CONTRAST MRA HEAD WITHOUT CONTRAST MRA NECK WITHOUT AND WITH CONTRAST TECHNIQUE: Multiplanar, multiecho pulse sequences of the brain and surrounding structures were obtained without and with intravenous contrast. Angiographic images of the Circle of Willis were obtained using MRA technique without intravenous contrast. Angiographic images of the neck were obtained using MRA technique without and with  intravenous contrast. Carotid stenosis measurements (when applicable) are obtained utilizing NASCET criteria, using the distal internal carotid diameter as the denominator. CONTRAST:  17mL GADAVIST GADOBUTROL 1 MMOL/ML IV SOLN COMPARISON:  None. FINDINGS: MRI HEAD FINDINGS Brain: There is no evidence of an acute infarct, intracranial hemorrhage, mass, midline shift, or extra-axial fluid collection. The ventricles and sulci are normal. Scattered T2 hyperintensities in the cerebral white matter bilaterally are nonspecific but compatible with mild chronic small vessel ischemic disease. No abnormal enhancement is identified. No cerebellar insult is evident. Vascular: Major intracranial vascular flow voids are preserved. Skull and upper cervical spine: Unremarkable bone marrow signal. Sinuses/Orbits: Unremarkable orbits. Paranasal sinuses and mastoid air cells are clear. Other: None. MRA HEAD FINDINGS The intracranial vertebral arteries are widely patent to the basilar. Patent PICAs, AICAs, and SCAs are seen bilaterally with a common origin of the right AICA and PICA noted. The basilar artery is widely patent. Posterior communicating arteries are not identified and may be diminutive or absent. Both PCAs are patent without evidence of a significant proximal stenosis. The internal carotid arteries are widely patent from skull base to carotid termini. ACAs and MCAs are patent without evidence of a proximal branch occlusion or significant proximal stenosis. No aneurysm is identified. MRA NECK FINDINGS There is a standard 3 vessel aortic arch. The brachiocephalic and subclavian arteries are widely patent. The common carotid and cervical internal carotid arteries are patent without evidence of stenosis or dissection. The vertebral arteries are patent and codominant with antegrade flow bilaterally. No vertebral artery dissection or stenosis is evident. IMPRESSION: 1. No acute intracranial abnormality. 2. Mild chronic small  vessel ischemic disease. 3. Negative head MRA. 4. Negative neck MRA. Electronically Signed   By: Logan Bores M.D.   On: 06/26/2020 11:55     Imaging studies the head neck reassuring with no evidence of central etiology for patient's severe and intractable vertigo.  Despite antiemetic, meclizine,  Valium, patient still unable to ambulate without severe vertigo-like symptoms.  She did vomit after attempted p.o. challenge.  Discussed with the patient, and she reports she is fine as long as she sits still but any movement causes severe vertigo and emesis.  Will give additional Zofran at this time, and will admit the patient for further care and management for what appears to be intractable vertigo-like symptoms.  Admission discussed with hospitalist Dr. Marcelyn Ditty, MD 06/26/20 [phone]

## 2020-06-26 NOTE — ED Notes (Signed)
Pt resting at this time. Will ambulate when PO medications have had time to work. Call bell in reach.

## 2020-06-26 NOTE — ED Provider Notes (Signed)
[facility] Emergency Department Provider Note  ____________________________________________   Event Date/Time   First MD Initiated Contact with Patient 06/26/20 [phone]     (approximate)  I have reviewed the triage vital signs and the nursing notes.   HISTORY  Chief Complaint Nausea and Emesis    HPI Connie Cameron is a 60 y.o. female with history of throat cancer who has finished treatments who presents to the emergency department with sudden onset vertigo that started just prior to arrival.  States she was asleep and woke up on her side.  She is not sure if she had just rolled over but states when she open her eyes everything was spinning.  She has had nausea, vomiting and diaphoresis.  No diarrhea.  No chest pain, shortness of breath, abdominal pain.  No numbness, tingling or weakness.  No hearing loss, ear pain or tinnitus.  Has never had vertigo before.  No history of CVA, TIA.  Symptoms are worse when she looks to the left.  Given Zofran in route with EMS.        Past Medical History:  Diagnosis Date  . Throat cancer [facility]) 2021    Patient Active Problem List   Diagnosis Date Noted  . Weight loss 02/08/2020  . Healthcare maintenance 02/08/2020  . Xerostomia 02/07/2020  . Leukopenia 06/30/2019  . Anemia 06/27/2019  . Mucositis 05/31/2019  . Encounter for antineoplastic chemotherapy 05/18/2019  . Chemotherapy-induced neutropenia ([place]) 05/11/2019  . Bronchitis 05/03/2019  . 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 Acute pharyngitis 05/02/2019  . Cellulitis 04/02/2019  . Goals of care, counseling/discussion 02/28/2019  . 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 Lymphadenopathy, cervical 12/28/2018  . Acute leg pain, left 04/22/2014  . Atypical chest pain 04/22/2014  . Hx of syncope 04/22/2014  . SOB (shortness of breath) 04/22/2014    Past Surgical History:  Procedure  Laterality Date  . COLONOSCOPY WITH PROPOFOL N/A 03/30/2020   Procedure: COLONOSCOPY WITH PROPOFOL;  Surgeon: Virgel Manifold, MD;  Location: [facility] ENDOSCOPY;  Service: Endoscopy;  Laterality: N/A;  COVID POSITIVE 02/29/2020  . ESOPHAGOGASTRODUODENOSCOPY (EGD) WITH PROPOFOL N/A 03/30/2020   Procedure: ESOPHAGOGASTRODUODENOSCOPY (EGD) WITH PROPOFOL;  Surgeon: Virgel Manifold, MD;  Location: [facility] ENDOSCOPY;  Service: Endoscopy;  Laterality: N/A;  . EXCISION MASS NECK Left 01/18/2019   Procedure: EXCISION MASS NECK/NODE;  Surgeon: Margaretha Sheffield, MD;  Location: [facility] ORS;  Service: ENT;  Laterality: Left;  . LARYNGOSCOPY Bilateral 02/22/2019   Procedure: MICROSCOPIC DIRECT LARYNGOSCOPY AND BIOPSY;  Surgeon: Margaretha Sheffield, MD;  Location: [facility] ORS;  Service: ENT;  Laterality: Bilateral;  . PORTA CATH INSERTION N/A 03/24/2019   Procedure: PORTA CATH INSERTION;  Surgeon: Katha Cabal, MD;  Location: [place] CV LAB;  Service: Cardiovascular;  Laterality: N/A;  . TONSILLECTOMY    . TUBAL LIGATION      Prior to Admission medications   Medication Sig Start Date End Date Taking? Authorizing Provider  meclizine (ANTIVERT) 25 MG tablet Take 1-2 tablets (25-50 mg total) by mouth 3 (three) times daily as needed for dizziness. 06/26/20  Yes Joniyah Mallinger, Cyril Mourning N, DO  ondansetron (ZOFRAN ODT) 4 MG disintegrating tablet Take 1 tablet (4 mg total) by mouth every 6 (six) hours as needed for nausea or vomiting. 06/26/20  Yes Joel Mericle, Cyril Mourning N, DO  gabapentin (NEURONTIN) 100 MG capsule Take by mouth. 06/15/20   [provider]  hydrOXYzine (ATARAX/VISTARIL) 25 MG tablet Take 25 mg by mouth daily. 06/15/20   [provider]  Na Sulfate-K Sulfate-Mg Sulf 17.5-3.13-1.6 GM/177ML SOLN At 5 PM the day before procedure take 1 bottle and 5 hours before procedure take 1 bottle. Patient not taking: Reported on 06/15/2020 02/23/20   Virgel Manifold, MD  omeprazole (PRILOSEC) 20 MG capsule Take 1 capsule  (20 mg total) by mouth daily. 03/30/20 04/29/20  Virgel Manifold, MD  sertraline (ZOLOFT) 25 MG tablet Take 25 mg by mouth daily. 06/15/20   [provider]    Allergies Patient has no known allergies.  Family History  Problem Relation Age of Onset  . Breast cancer Sister   . Brain cancer Paternal Grandfather     Social History Social History   Tobacco Use  . Smoking status: Never Smoker  . Smokeless tobacco: Never Used  Vaping Use  . Vaping Use: Never used  Substance Use Topics  . Alcohol use: Not Currently  . Drug use: No    Review of Systems Constitutional: No fever. Eyes: No visual changes. ENT: No sore throat. Cardiovascular: Denies chest pain. Respiratory: Denies shortness of breath. Gastrointestinal: + Nausea and vomiting. Genitourinary: Negative for dysuria. Musculoskeletal: Negative for back pain. Skin: Negative for rash. Neurological: Negative for focal weakness or numbness.  ____________________________________________   PHYSICAL EXAM:  VITAL SIGNS: ED Triage Vitals  Enc Vitals Group     BP 06/26/20 0500 (!) 160/93     Pulse Rate 06/26/20 0500 70     Resp 06/26/20 0500 20     Temp 06/26/20 0500 (!) 97.3 F (36.3 C)     Temp Source 06/26/20 0500 Oral     SpO2 06/26/20 0459 100 %     Weight 06/26/20 0501 145 lb 8.1 oz (66 kg)     Height 06/26/20 0501 5\' 1"  (1.549 m)     Head Circumference --      Peak Flow --      Pain Score 06/26/20 0501 0     Pain Loc --      Pain Edu? --      Excl. in [place]? --    CONSTITUTIONAL: Alert and oriented and responds appropriately to questions. Well-appearing; well-nourished HEAD: Normocephalic, atraumatic EYES: Conjunctivae clear, pupils appear equal, EOM appear intact ENT: normal nose; moist mucous membranes; TMs are clear bilaterally without erythema, purulence, bulging, perforation, effusion.  No cerumen impaction or sign of foreign body in the external auditory canal. No inflammation, erythema or  drainage from the external auditory canal. No signs of mastoiditis. No pain with manipulation of the pinna bilaterally. NECK: Supple, normal ROM CARD: RRR; S1 and S2 appreciated; no murmurs, no clicks, no rubs, no gallops RESP: Normal chest excursion without splinting or tachypnea; breath sounds clear and equal bilaterally; no wheezes, no rhonchi, no rales, no hypoxia or respiratory distress, speaking full sentences ABD/GI: Normal bowel sounds; non-distended; soft, non-tender, no rebound, no guarding, no peritoneal signs, no hepatosplenomegaly BACK: The back appears normal EXT: Normal ROM in all joints; no deformity noted, no edema; no cyanosis SKIN: Normal color for age and race; warm; no rash on exposed skin NEURO: Moves all extremities equally, normal sensation diffusely, normal speech, cranial nerves II through XII intact, patient has vertiginous symptoms when looking to the left, no nystagmus appreciated, gait deferred at this time PSYCH: The patient's mood and manner are appropriate.  ____________________________________________   LABS (all labs ordered are listed, but only abnormal results are displayed)  Labs Reviewed  BASIC METABOLIC PANEL - Abnormal; Notable for the following components:      Result Value   Glucose, Bld 212 (*)    BUN 30 (*)    All other components within normal limits  CBG MONITORING, ED - Abnormal; Notable for the following components:   Glucose-Capillary 175 (*)    All other components within normal limits  CBC WITH DIFFERENTIAL/PLATELET  URINALYSIS, COMPLETE (UACMP) WITH MICROSCOPIC   ____________________________________________  EKG   Date: 06/26/2020 5:02 AM  Rate: 58  Rhythm: normal sinus rhythm  QRS Axis: normal  Intervals: normal  ST/T Wave abnormalities: normal  Conduction Disutrbances: none  Narrative Interpretation: unremarkable   ;  ____________________________________________  RADIOLOGY I, Jeren Dufrane, personally viewed and  evaluated these images (plain radiographs) as part of my medical decision making, as well as reviewing the written report by the radiologist.  ED MD interpretation:    Official radiology report(s): No results found.  ____________________________________________   PROCEDURES  Procedure(s) performed (including Critical Care):  Procedures   ____________________________________________   INITIAL IMPRESSION / ASSESSMENT AND PLAN / ED COURSE  As part of my medical decision making, I reviewed the following data within the [place] notes reviewed and incorporated, Labs reviewed , Old chart reviewed, Patient signed out to oncoming EDP and Notes from prior ED visits         Patient here with symptoms of vertigo.  Seems to be peripheral in nature.  No other focal neurologic deficits.  Will give IV fluids, meclizine.  Will obtain labs to ensure no anemia, electrolyte derangement contributing to her symptoms.  No risk factors for CVA.  ED PROGRESS  6:28 AM  Patient's labs reassuring.  Electrolytes normal.  Hemoglobin normal.  Blood glucose minimally elevated.  She reports feeling better after meclizine and is able to tolerate small sips of fluid.  Still having some vertiginous symptoms and unable to get up at this time.  Will give oral Valium and then attempt ambulate.  7:24 AM Signed out to Dr. Jacqualine Code.  I reviewed all nursing notes and pertinent previous records as available.  I have reviewed and interpreted any EKGs, lab and urine results, imaging (as available).  ____________________________________________   FINAL CLINICAL IMPRESSION(S) / ED DIAGNOSES  Final diagnoses:  Vertigo     ED Discharge Orders         Ordered    meclizine (ANTIVERT) 25 MG tablet  3 times daily PRN        06/26/20 0726    ondansetron (ZOFRAN ODT) 4 MG disintegrating tablet  Every 6 hours PRN        06/26/20 0726          *Please note:  Connie Cameron was evaluated in  Emergency Department on 06/26/2020 for the symptoms described in the history of present illness. She was evaluated in the context of the global COVID-19 pandemic, which necessitated consideration that the patient might be at risk for infection with the SARS-CoV-2 virus that causes COVID-19. Institutional protocols and algorithms that pertain to the evaluation of patients at risk for COVID-19 are in a state of rapid change based on information released by regulatory bodies including the CDC and federal and state organizations. These policies and algorithms were followed during the patient's care in the ED.  Some ED evaluations and interventions may be delayed as a result of limited staffing during and the pandemic.*   Note:  This document was prepared using Dragon  voice recognition software and may include unintentional dictation errors.   Rashawnda Gaba, Delice Bison, DO 06/26/20 [phone]

## 2020-06-26 NOTE — ED Notes (Signed)
Pt states she is still very dizzy and does not feel she can safely ambulate at this time.

## 2020-06-26 NOTE — ED Notes (Signed)
This RN reassessed pt and she still states she is dizzy and not well enough to ambulate safely at this time, MD aware, new orders for MRI.  MD wanted swallow eval done and pt with no issues, no choking noted and was able to drink 30 ml of water without choking or stopping.

## 2020-06-26 NOTE — ED Notes (Signed)
Brief update to son, Erlene Quan.

## 2020-06-26 NOTE — ED Notes (Signed)
purewick placed on pt.

## 2020-06-26 NOTE — ED Notes (Signed)
Patient assisted to bedside toilet. Pt unable to tolerate standing without becoming extremely dizzy. Pt only able to stand for brief period of time, immediately nauseated on return to stretcher. Unable to ambulate at this time. Pt assisted with two people from stretcher to toilet and back to stretcher.

## 2020-06-27 DIAGNOSIS — Z923 Personal history of irradiation: Secondary | ICD-10-CM | POA: Diagnosis not present

## 2020-06-27 DIAGNOSIS — R61 Generalized hyperhidrosis: Secondary | ICD-10-CM | POA: Diagnosis present

## 2020-06-27 DIAGNOSIS — R42 Dizziness and giddiness: Secondary | ICD-10-CM | POA: Diagnosis present

## 2020-06-27 DIAGNOSIS — Z9221 Personal history of antineoplastic chemotherapy: Secondary | ICD-10-CM | POA: Diagnosis not present

## 2020-06-27 DIAGNOSIS — K219 Gastro-esophageal reflux disease without esophagitis: Secondary | ICD-10-CM | POA: Diagnosis present

## 2020-06-27 DIAGNOSIS — Z85810 Personal history of malignant neoplasm of tongue: Secondary | ICD-10-CM | POA: Diagnosis not present

## 2020-06-27 DIAGNOSIS — F32A Depression, unspecified: Secondary | ICD-10-CM | POA: Diagnosis present

## 2020-06-27 DIAGNOSIS — H8110 Benign paroxysmal vertigo, unspecified ear: Secondary | ICD-10-CM | POA: Diagnosis present

## 2020-06-27 DIAGNOSIS — Z888 Allergy status to other drugs, medicaments and biological substances status: Secondary | ICD-10-CM | POA: Diagnosis not present

## 2020-06-27 DIAGNOSIS — Z79899 Other long term (current) drug therapy: Secondary | ICD-10-CM | POA: Diagnosis not present

## 2020-06-27 DIAGNOSIS — Z803 Family history of malignant neoplasm of breast: Secondary | ICD-10-CM | POA: Diagnosis not present

## 2020-06-27 DIAGNOSIS — Z20822 Contact with and (suspected) exposure to covid-19: Secondary | ICD-10-CM | POA: Diagnosis present

## 2020-06-27 DIAGNOSIS — Z808 Family history of malignant neoplasm of other organs or systems: Secondary | ICD-10-CM | POA: Diagnosis not present

## 2020-06-27 DIAGNOSIS — F419 Anxiety disorder, unspecified: Secondary | ICD-10-CM | POA: Diagnosis present

## 2020-06-27 LAB — BASIC METABOLIC PANEL
Anion gap: 7 (ref 5–15)
BUN: 18 mg/dL (ref 6–20)
CO2: 26 mmol/L (ref 22–32)
Calcium: 9 mg/dL (ref 8.9–10.3)
Chloride: 109 mmol/L (ref 98–111)
Creatinine, Ser: 0.98 mg/dL (ref 0.44–1.00)
GFR, Estimated: >60 mL/min (ref >60)
Glucose, Bld: 105 mg/dL — ABNORMAL HIGH (ref 70–99)
Potassium: 4.3 mmol/L (ref 3.5–5.1)
Sodium: 142 mmol/L (ref 135–145)

## 2020-06-27 LAB — HIV ANTIBODY (ROUTINE TESTING W REFLEX): HIV Screen 4th Generation wRfx: NONREACTIVE

## 2020-06-27 MED ORDER — MECLIZINE HCL 12.5 MG PO TABS
12.5000 mg | ORAL_TABLET | Freq: Three times a day (TID) | ORAL | Status: AC
  Administered 2020-06-27 (×2): 12.5 mg via ORAL
  Filled 2020-06-27 (×2): qty 1

## 2020-06-27 NOTE — Evaluation (Signed)
Occupational Therapy Evaluation Patient Details Name: Connie Cameron MRN: [id number] DOB: 1960-05-03 Today's Date: 06/27/2020    History of Present Illness The patient is a 60 yo female that presented to ED with dizziness, vomiting. 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   Clinical Impression   Pt seen for OT evaluation this date. Upon arrival to room, pt in resting in bed with lights off. Following education regarding OT's role in acute care, pt agreeable to OT evaluation/tx despite reporting nausea and dizziness while at rest. Prior to admission, pt was independent in ADLs, IADLs, and functional mobility, living in a 1-level home with roommate. Pt reports that roommate was recently hospitalized for hernia repair and is unsure level of assist roommate can provide if need be. Pt currently presents with nausea, dizziness, and impaired balance, and requires SUPERVISION/SET-UP for bed mobility, SUPERVISION/SET-UP for seated grooming tasks at EOB, and MIN GUARD seated LB dressing. Pt deferred further mobility this date d/t worsening dizziness/nausea. Pt would benefit from additional skilled OT services to maximize return to PLOF. Given pt's current functional impairments and lack of assistance at home, OT to recommend SNF upon discharge.      Follow Up Recommendations  SNF    Equipment Recommendations  Other (comment) (defer to next venue of care)       Precautions / Restrictions Precautions Precautions: Fall Restrictions Weight Bearing Restrictions: No      Mobility Bed Mobility Overal bed mobility: Needs Assistance Bed Mobility: Supine to Sit;Sit to Supine     Supine to sit: Supervision;HOB elevated Sit to supine: Supervision;HOB elevated   General bed mobility comments: verbal cues for adaptive strategies to minimize lateral head movement during bed mobility    Transfers Overall transfer level: Needs assistance Equipment used: 1 person  hand held assist Transfers: Sit to/from [facility] Sit to Stand: Min assist Stand pivot transfers: Min assist       General transfer comment: Pt deferring d/t dizziness    Balance Overall balance assessment: Needs assistance Sitting-balance support: No upper extremity supported;Feet supported Sitting balance-Leahy Scale: Good Sitting balance - Comments: With increased time, pt able to independently weight shift to adjust linens while seated in bed   Standing balance support: Bilateral upper extremity supported Standing balance-Leahy Scale: Poor                             ADL either performed or assessed with clinical judgement   ADL Overall ADL's : Needs assistance/impaired     Grooming: Wash/dry face;Oral care;Supervision/safety;Set up;Sitting               Lower Body Dressing: Min guard;Sitting/lateral leans Lower Body Dressing Details (indicate cue type and reason): to don/doff socks via figure-4 position                     Vision Patient Visual Report: Nausea/blurring vision with head movement Vision Assessment?: Vision impaired- to be further tested in functional context            Pertinent Vitals/Pain Pain Assessment: No/denies pain     Hand Dominance Right   Extremity/Trunk Assessment Upper Extremity Assessment Upper Extremity Assessment: Overall WFL for tasks assessed   Lower Extremity Assessment Lower Extremity Assessment: Overall WFL for tasks assessed   Cervical / Trunk Assessment Cervical / Trunk Assessment: Normal   Communication Communication Communication: No difficulties   Cognition Arousal/Alertness: Awake/alert  Behavior During Therapy: WFL for tasks assessed/performed Overall Cognitive Status: Within Functional Limits for tasks assessed                                 General Comments: Pleasant and agreeable to therapy despite experiencing nausea and dizziness      Exercises  Other Exercises Other Exercises: Education on role of OT in acute care setting         Home Living Family/patient expects to be discharged to:: Private residence Living Arrangements: Non-relatives/Friends Available Help at Discharge: Friend(s);Available PRN/intermittently Type of Home: House Home Access: Stairs to enter [employer] of Steps: 3-4 Entrance Stairs-Rails: Right;Left;Can reach both Home Layout: One level     Bathroom Shower/Tub: Teacher, early years/pre: Standard     Home Equipment: None          Prior Functioning/Environment Level of Independence: Independent        Comments: Independent with ADLs and functional mobility. Pt drives and performs IADLs independently        OT Problem List: Decreased activity tolerance;Impaired balance (sitting and/or standing);Impaired vision/perception      OT Treatment/Interventions: Self-care/ADL training;Therapeutic exercise;DME and/or AE instruction;Therapeutic activities;Visual/perceptual remediation/compensation;Patient/family education;Balance training    OT Goals(Current goals can be found in the care plan section) Acute Rehab OT Goals Patient Stated Goal: to stop being dizzy OT Goal Formulation: With patient Time For Goal Achievement: 07/11/20 ADL Goals Pt Will Transfer to Toilet: with modified independence;stand pivot transfer;bedside commode Pt Will Perform Toileting - Clothing Manipulation and hygiene: with modified independence;sitting/lateral leans Additional ADL Goal #1: Pt will independently implement at least 1 compensatory/adaptive strategy to mitigate symptoms of nausea/dizziness with positional changes during ADLs/functional mobility  OT Frequency: Min 1X/week    AM-PAC OT "6 Clicks" Daily Activity     Outcome Measure Help from another person eating meals?: None Help from another person taking care of personal grooming?: A Little Help from another person toileting, which  includes using toliet, bedpan, or urinal?: A Little Help from another person bathing (including washing, rinsing, drying)?: A Lot Help from another person to put on and taking off regular upper body clothing?: A Little Help from another person to put on and taking off regular lower body clothing?: A Little 6 Click Score: 18   End of Session Nurse Communication: Mobility status  Activity Tolerance: Patient tolerated treatment well Patient left: in bed;with call bell/phone within reach;with bed alarm set  OT Visit Diagnosis: Unsteadiness on feet (R26.81);Dizziness and giddiness (R42)                Time: 9735-3299 OT Time Calculation (min): 13 min Charges:  OT General Charges $OT Visit: 1 Visit OT Evaluation $OT Eval Moderate Complexity: Haskell Freestone, OTR/L Gilliam

## 2020-06-27 NOTE — Evaluation (Signed)
Physical Therapy Evaluation Patient Details Name: Connie Cameron MRN: [id number] DOB: Jul 14, 1960 Today's Date: 06/27/2020   History of Present Illness  The patient is a 60 yo female that presented to ED with dizziness, vomiting. 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    Clinical Impression  Patient alert, agreeable to PT evaluation, denied pain. The patient reported at baseline prior to onset of dizziness she is independent, lives with a friend. No falls. Pt assisted to [facility] with minA and handheld assist at pt request to urinate, dizzy throughout. Vestibular assessment performed, L dix hallpike with eplyx1. Pt very symptomatic, unable to tolerate more attempts, returned to supine all needs in reach.  Overall the patient demonstrated deficits (see "PT Problem List") that impede the patient's functional abilities, safety, and mobility and would benefit from skilled PT intervention. Recommendation is SNF due to current level of assistance needed pending further pt progress.  Screening questions: Denied unilateral weakness/numbness, facial droop, slurred speech, or difficulty swallowing with episode, tinnitus, diplopia/visual field deficits aural fullness, ear pain, fluid coming from ears, worsening hearing. Strength and sensation of UE and LE asssessed, symmetrical and WFLs  Subjective history of current problem: (Describe incident, precipitating factors, symptoms when it happened, any prior episodes, sudden onset, versus slow onset). Pt reported she was sleeping in her recliner in the living room when she woke at 3am, and adjusted the chair to sit up. Reported immediate onset of significant dizziness, that lasted until arrival in ED. Resolved, no symptoms at rest.  exacerbating factors: moving, rolling in bed, looking/turning her head to the left, standing up  Relieving factors: laying supine, closing eyes  Symptom duration:  >1 minute, does decay over  time  Symptom frequency: With any movement/transfers  Description of symptoms, description of dizziness: Pt endorses rotation of room, difficulty describing but denies feeling light headed, syncopal, or short of breath   OCULOMOTOR / VESTIBULAR TESTING:  Oculomotor Exam- Room Light  Findings Comments  Ocular Alignment normal   Ocular ROM normal   Spontaneous Nystagmus normal   Gaze-Holding Nystagmus normal   End-Gaze Nystagmus normal   Vergence (normal 2-3") abnormal   Smooth Pursuit normal   Cross-Cover Test normal   Saccades normal   VOR Cancellation not examined   Left Head Impulse not examined   Right Head Impulse not examined        Follow Up Recommendations SNF;Supervision - Intermittent    Equipment Recommendations  Rolling walker with 5" wheels    Recommendations for Other Services       Precautions / Restrictions Precautions Precautions: Fall Restrictions Weight Bearing Restrictions: No      Mobility  Bed Mobility Overal bed mobility: Needs Assistance Bed Mobility: Supine to Sit;Sit to Supine     Supine to sit: HOB elevated;Min assist Sit to supine: HOB elevated;Min assist        Transfers Overall transfer level: Needs assistance Equipment used: 1 person hand held assist Transfers: Sit to/from [facility] Sit to Stand: Min assist Stand pivot transfers: Min assist          Ambulation/Gait             General Gait Details: unable at this time due to pt symptoms  Stairs            Wheelchair Mobility    Modified Rankin (Stroke Patients Only)       Balance Overall balance assessment: Needs assistance Sitting-balance support: Feet supported Sitting  balance-Leahy Scale: Fair     Standing balance support: Bilateral upper extremity supported Standing balance-Leahy Scale: Poor                               Pertinent Vitals/Pain Pain Assessment: No/denies pain    Home Living  Family/patient expects to be discharged to:: Private residence Living Arrangements: Non-relatives/Friends Available Help at Discharge: Friend(s);Available PRN/intermittently Type of Home: House Home Access: Stairs to enter Entrance Stairs-Rails: Right;Left;Can reach both Entrance Stairs-Number of Steps: 3-4 Home Layout: One level Home Equipment: None      Prior Function Level of Independence: Independent               Hand Dominance   Dominant Hand: Right    Extremity/Trunk Assessment   Upper Extremity Assessment Upper Extremity Assessment: Overall WFL for tasks assessed    Lower Extremity Assessment Lower Extremity Assessment: Overall WFL for tasks assessed    Cervical / Trunk Assessment Cervical / Trunk Assessment: Normal  Communication   Communication: No difficulties  Cognition Arousal/Alertness: Awake/alert Behavior During Therapy: WFL for tasks assessed/performed Overall Cognitive Status: Within Functional Limits for tasks assessed                                        General Comments      Exercises Other Exercises Other Exercises: Pt assisted to [facility] with stand pivot transfer at start of session; minA. pericare with supervision Other Exercises: L dix hallpike and eplyx1. Pt sympomatic, crying, nauseous further attempts deferred   Assessment/Plan    PT Assessment Patient needs continued PT services  PT Problem List Decreased strength;Decreased mobility;Decreased activity tolerance;Decreased balance;Decreased knowledge of use of DME       PT Treatment Interventions DME instruction;Therapeutic exercise;Gait training;Balance training;Stair training;Neuromuscular re-education;Functional mobility training;Therapeutic activities;Patient/family education    PT Goals (Current goals can be found in the Care Plan section)  Acute Rehab PT Goals Patient Stated Goal: to stop being dizzy PT Goal Formulation: With patient Time For Goal  Achievement: 07/11/20 Potential to Achieve Goals: Good    Frequency     Barriers to discharge Decreased caregiver support      Co-evaluation               AM-PAC PT "6 Clicks" Mobility  Outcome Measure Help needed turning from your back to your side while in a flat bed without using bedrails?: A Little Help needed moving from lying on your back to sitting on the side of a flat bed without using bedrails?: A Little Help needed moving to and from a bed to a chair (including a wheelchair)?: A Little Help needed standing up from a chair using your arms (e.g., wheelchair or bedside chair)?: A Little Help needed to walk in hospital room?: A Lot Help needed climbing 3-5 steps with a railing? : Total 6 Click Score: 15    End of Session Equipment Utilized During Treatment: Gait belt Activity Tolerance: Other (comment) (limited by nausea, dizziness) Patient left: in bed;with call bell/phone within reach;with bed alarm set Nurse Communication: Mobility status PT Visit Diagnosis: Other abnormalities of gait and mobility (R26.89);Difficulty in walking, not elsewhere classified (R26.2);Muscle weakness (generalized) (M62.81)    Time: 9147-8295 PT Time Calculation (min) (ACUTE ONLY): 53 min   Charges:   PT Evaluation $PT Eval Low Complexity: 1 Low PT Treatments $Therapeutic  Activity: 8-22 mins $Canalith Rep Proc: 23-37 mins       Lieutenant Diego PT, DPT 12:35 PM,06/27/20

## 2020-06-27 NOTE — Progress Notes (Signed)
PROGRESS NOTE  [name]  DOB: [birth year]  PCP: [place] OZD:[id number]  DOA: 06/26/2020  LOS: 0 days  Hospital Day: 2   Chief Complaint  Patient presents with  . Nausea  . Emesis   Brief narrative: Connie Cameron is a 60 y.o. female with PMH significant for carcinoma of base of tongue (s/p of surgery, radiation and chemotherapy), GERD, depression with anxiety, anemia, mucositis, xerostomia. Patient presented to the ED on 5/23 with sudden onset of nausea, vomiting, vertigo.  Dizziness is worse upon turning her head to the left.  No other associated neurological symptoms.  In the ER, patient was afebrile, blood pressure elevated to 160/93. Labs mostly unremarkable MRI brain negative. MRA head and neck negative. Kept in overnight observation under hospitalist service.  Subjective: Patient was seen and examined this morning.  Pleasant middle-aged Caucasian female.  Lying down in bed.  She had her head tilted towards right to avoid dizziness when turning left. Chart reviewed Remains hemodynamically stable.  Labs unremarkable  Assessment/Plan: Sudden onset vertigo -Negative MRI of brain and MRA head and neck. -Differential diagnoses include Mnire's disease (less likely given no tinnitus and hearing loss), benign paroxysmal positional vertigo (possible, patient reports worsening dizziness on position change), vestibular neuritis (less likely, patient denies recent viral illness). -Currently on symptomatic treatment with IV Zofran and meclizine. -Adequately hydrated. -EKG with 58 bpm, QTC 453 ms. -Vestibular PT evaluation pending.  Carcinoma of base of tongue  -s/p surgery, radiation and chemotherapy.  Currently in remission -f/u with Dr. Mike Gip  GERD -Protonix  Depression with anxiety -Continue home medications  Mobility: PT eval pending Code Status:   Code Status: Full Code  Nutritional status: Body mass index is 27.49 kg/m.     Diet Order             Diet regular Room service appropriate? Yes; Fluid consistency: Thin  Diet effective now                 DVT prophylaxis: enoxaparin (LOVENOX) injection 40 mg Start: 06/26/20 2200   Antimicrobials:  None Fluid: Normal saline at 75 mill per hour Consultants: None Family Communication:  None at bedside  Status is: Inpatient Remains inpatient appropriate because: has severe vertigo  Dispo: The patient is from: Home              Anticipated d/c is to: Home, uncertain at this time because of severity of vertigo              Patient currently is not medically stable to d/c.   Difficult to place patient No     Infusions:  . sodium chloride 75 mL/hr at 06/27/20 0918    Scheduled Meds: . enoxaparin (LOVENOX) injection  40 mg Subcutaneous Q24H  . meclizine  12.5 mg Oral TID    Antimicrobials: Anti-infectives (From admission, onward)   None      PRN meds: acetaminophen, gabapentin, hydrOXYzine, ondansetron (ZOFRAN) IV, pantoprazole, sertraline   Objective: Vitals:   06/27/20 0324 06/27/20 0814  BP: 128/86 116/80  Pulse: 67 69  Resp: 18 15  Temp: 98.7 F (37.1 C) 98.4 F (36.9 C)  SpO2: 98% 97%    Intake/Output Summary (Last 24 hours) at 06/27/2020 1028 Last data filed at 06/27/2020 1016 Gross per 24 hour  Intake 120 ml  Output --  Net 120 ml   Filed Weights   06/26/20 0501  Weight: 66 kg   Weight change:  Body mass index is  27.49 kg/m.   Physical Exam: General exam: Pleasant, middle-aged Caucasian female.  Not in physical pain but stressed because of anticipated vertigo Skin: No rashes, lesions or ulcers. HEENT: Atraumatic, normocephalic, no obvious bleeding Lungs: Clear to auscultation bilaterally CVS: Regular rate and rhythm, no murmur GI/Abd soft, nontender, nondistended, bowel sound present CNS: Alert, awake, oriented x3 Psychiatry: Mood appropriate Extremities: No pedal edema, no calf tenderness  Data Review: I have personally reviewed  the laboratory data and studies available.  Recent Labs  Lab 06/26/20 0505  WBC 7.0  NEUTROABS 3.6  HGB 12.7  HCT 37.5  MCV 91.0  PLT 246   Recent Labs  Lab 06/26/20 0505 06/27/20 0425  NA 140 142  K 3.9 4.3  CL 107 109  CO2 22 26  GLUCOSE 212* 105*  BUN 30* 18  CREATININE 1.00 0.98  CALCIUM 9.5 9.0    F/u labs ordered Unresulted Labs (From admission, onward)          Start     Ordered   06/28/20 3833  Basic metabolic panel  Daily,   R     Question:  Specimen collection method  Answer:  Lab=Lab collect   06/27/20 1028   06/28/20 0500  CBC with Differential/Platelet  Daily,   R     Question:  Specimen collection method  Answer:  Lab=Lab collect   06/27/20 1028   06/26/20 1928  HIV Antibody (routine testing w rflx)  (HIV Antibody (Routine testing w reflex) panel)  Once,   R        06/26/20 1927          Signed, Terrilee Croak, MD [facility] 06/27/2020

## 2020-06-28 LAB — CBC WITH DIFFERENTIAL/PLATELET
Abs Immature Granulocytes: 0.01 10*3/uL (ref 0.00–0.07)
Basophils Absolute: 0 10*3/uL (ref 0.0–0.1)
Basophils Relative: 0 %
Eosinophils Absolute: 0.1 10*3/uL (ref 0.0–0.5)
Eosinophils Relative: 3 %
HCT: 36 % (ref 36.0–46.0)
Hemoglobin: 12 g/dL (ref 12.0–15.0)
Immature Granulocytes: 0 %
Lymphocytes Relative: 26 %
Lymphs Abs: 1.2 10*3/uL (ref 0.7–4.0)
MCH: 30.8 pg (ref 26.0–34.0)
MCHC: 33.3 g/dL (ref 30.0–36.0)
MCV: 92.5 fL (ref 80.0–100.0)
Monocytes Absolute: 0.4 10*3/uL (ref 0.1–1.0)
Monocytes Relative: 8 %
Neutro Abs: 2.9 10*3/uL (ref 1.7–7.7)
Neutrophils Relative %: 63 %
Platelets: 207 10*3/uL (ref 150–400)
RBC: 3.89 MIL/uL (ref 3.87–5.11)
RDW: 13.1 % (ref 11.5–15.5)
WBC: 4.6 10*3/uL (ref 4.0–10.5)
nRBC: 0 % (ref 0.0–0.2)

## 2020-06-28 LAB — BASIC METABOLIC PANEL
Anion gap: 7 (ref 5–15)
BUN: 22 mg/dL — ABNORMAL HIGH (ref 6–20)
CO2: 26 mmol/L (ref 22–32)
Calcium: 9.2 mg/dL (ref 8.9–10.3)
Chloride: 108 mmol/L (ref 98–111)
Creatinine, Ser: 0.91 mg/dL (ref 0.44–1.00)
GFR, Estimated: >60 mL/min (ref >60)
Glucose, Bld: 106 mg/dL — ABNORMAL HIGH (ref 70–99)
Potassium: 4.4 mmol/L (ref 3.5–5.1)
Sodium: 141 mmol/L (ref 135–145)

## 2020-06-28 MED ORDER — MECLIZINE HCL 12.5 MG PO TABS
12.5000 mg | ORAL_TABLET | Freq: Three times a day (TID) | ORAL | Status: DC | PRN
  Administered 2020-06-28 – 2020-06-30 (×6): 12.5 mg via ORAL
  Filled 2020-06-28 (×8): qty 1

## 2020-06-28 NOTE — TOC Initial Note (Signed)
Transition of Care [facility]) - Initial/Assessment Note    Patient Details  Name: Connie Cameron MRN: [id number] Date of Birth: 1960/10/02  [facility] CM/SW Contact:    Shelbie Hutching, RN Phone Number: 06/28/2020, 2:18 PM  Clinical Narrative:                 Patient admitted to the hospital with vertigo.  RNCM met with patient at the bedside.  Patient is from home where she lives with her roommate.  Patient reports before she started feeling sick the other day she was completely independent and required no assistive devices.  Patient does not want to go to SNF for rehab, she would prefer to go home and do outpatient therapy.  She feels that once the dizziness and nausea reside she will be fine to return home.  She already says that she feels a little better today.  TOC team will cont to follow.   Expected Discharge Plan: OP Rehab Barriers to Discharge: Continued Medical Work up   Patient Goals and CMS Choice Patient states their goals for this hospitalization and ongoing recovery are:: to feel better and go home      Expected Discharge Plan and Services Expected Discharge Plan: OP Rehab   Discharge Planning Services: CM Consult   Living arrangements for the past 2 months: Mobile Home                 DME Arranged: N/A DME Agency: NA       HH Arranged: NA          Prior Living Arrangements/Services Living arrangements for the past 2 months: Mobile Home Lives with:: Friends Patient language and need for interpreter reviewed:: Yes Do you feel safe going back to the place where you live?: Yes      Need for Family Participation in Patient Care: Yes (Comment) (vertigo) Care giver support system in place?: Yes (comment) (roommate and fiance)   Criminal Activity/Legal Involvement Pertinent to Current Situation/Hospitalization: No - Comment as needed  Activities of Daily Living Home Assistive Devices/Equipment: None ADL Screening (condition at time of  admission) Patient's cognitive ability adequate to safely complete daily activities?: Yes Is the patient deaf or have difficulty hearing?: No Does the patient have difficulty seeing, even when wearing glasses/contacts?: No Does the patient have difficulty concentrating, remembering, or making decisions?: No Patient able to express need for assistance with ADLs?: Yes Does the patient have difficulty dressing or bathing?: No Independently performs ADLs?: No Communication: Needs assistance Dressing (OT): Needs assistance Grooming: Needs assistance Feeding: Independent with device (comment) Bathing: Needs assistance Toileting: Needs assistance In/Out Bed: Needs assistance Walks in Home: Needs assistance Does the patient have difficulty walking or climbing stairs?: No Weakness of Legs: None Weakness of Arms/Hands: None  Permission Sought/Granted Permission sought to share information with : Case Manager,Family Supports Permission granted to share information with : Yes, Verbal Permission Granted        Permission granted to share info w Relationship: fiance     Emotional Assessment Appearance:: Appears stated age Attitude/Demeanor/Rapport: Engaged Affect (typically observed): Accepting Orientation: : Oriented to Self,Oriented to Place,Oriented to  Time,Oriented to Situation Alcohol / Substance Use: Not Applicable Psych Involvement: No (comment)  Admission diagnosis:  Vertigo [R42] Patient Active Problem List   Diagnosis Date Noted  . Vertigo 06/26/2020  . GERD (gastroesophageal reflux disease) 06/26/2020  . Depression with anxiety 06/26/2020  . Weight loss 02/08/2020  . Healthcare maintenance 02/08/2020  .  Xerostomia 02/07/2020  . Leukopenia 06/30/2019  . Anemia 06/27/2019  . Mucositis 05/31/2019  . Encounter for antineoplastic chemotherapy 05/18/2019  . Chemotherapy-induced neutropenia ([place]) 05/11/2019  . Bronchitis 05/03/2019  . 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  Acute pharyngitis 05/02/2019  . Cellulitis 04/02/2019  . Goals of care, counseling/discussion 02/28/2019  . 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 Carcinoma of base of tongue ([place]) 02/04/2019  . Lymphadenopathy, cervical 12/28/2018  . Acute leg pain, left 04/22/2014  . Atypical chest pain 04/22/2014  . Hx of syncope 04/22/2014  . SOB (shortness of breath) 04/22/2014   PCP:  [place] Pharmacy:   [id number] [place] [id number]: [phone]: [phone]    Social Determinants of Health (SDOH) Interventions    Readmission Risk Interventions No flowsheet data found.

## 2020-06-28 NOTE — Progress Notes (Signed)
Progress Note    Connie Cameron   XLK:[id number]  DOB: 13-Jul-1960  DOA: 06/26/2020     1  PCP: [facility]  CC: vertigo  Hospital Course: Connie Cameron is a 60 y.o. 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 Patient presented to the ED on 5/23 with sudden onset of nausea, vomiting, vertigo.  Dizziness is worse upon turning her head to the left.  No other associated neurological symptoms.  In the ER, patient was afebrile, blood pressure elevated to 160/93. Labs mostly unremarkable MRI brain negative. MRA head and neck negative.  She underwent evaluation with vestibular PT and had positive Dix-Hallpike testing.  She did not tolerate Epley maneuvers initially and required further vestibular PT.  Interval History:  Did not tolerate Epley maneuvers yesterday with vestibular PT.  Today she is motivated for reattempting again.  Still complains of dizziness when turning her head to the left and states that laying in bed still helps.  ROS: Constitutional: negative for chills and fevers, Respiratory: negative for cough, Cardiovascular: negative for chest pain and Gastrointestinal: negative for abdominal pain  Assessment & Plan: BPPV -Negative MRI of brain and MRA head and neck. Marye Round testing positive - continue Epley maneuvers with vestibular PT; may need SNF if not great improvement - continue meclizine PRN  Carcinoma of base of tongue  -s/p surgery, radiation and chemotherapy.  Currently in remission -f/u with Dr. Mike Gip  GERD -Protonix  Depression with anxiety -Continue home medications  Old records reviewed in assessment of this patient  Antimicrobials: n/a  DVT prophylaxis: enoxaparin (LOVENOX) injection 40 mg Start: 06/26/20 2200   Code Status:   Code Status: Full Code Family Communication:   Disposition Plan: Status  is: Inpatient  Remains inpatient appropriate because:Inpatient level of care appropriate due to severity of illness   Dispo: The patient is from: Home              Anticipated d/c is to: pending clinical course              Patient currently is not medically stable to d/c.   Difficult to place patient No  Risk of unplanned readmission score: Unplanned Admission- Pilot do not use: 10.4   Objective: Blood pressure 132/84, pulse 68, temperature 97.8 F (36.6 C), temperature source Oral, resp. rate 15, height 5\' 1"  (1.549 m), weight 66 kg, SpO2 100 %.  Examination: General appearance: alert, cooperative and no distress Head: Normocephalic, without obvious abnormality, atraumatic Eyes: EOMI; no nystagmus Lungs: clear to auscultation bilaterally Heart: regular rate and rhythm and S1, S2 normal Abdomen: normal findings: bowel sounds normal and soft, non-tender Extremities: EOMI Skin: mobility and turgor normal Neurologic: Grossly normal  Consultants:   Vestibular PT  Procedures:     Data Reviewed: I have personally reviewed following labs and imaging studies Results for orders placed or performed during the hospital encounter of 06/26/20 (from the past 24 hour(s))  Basic metabolic panel     Status: Abnormal   Collection Time: 06/28/20  4:58 AM  Result Value Ref Range   Sodium 141 135 - 145 mmol/L   Potassium 4.4 3.5 - 5.1 mmol/L   Chloride 108 98 - 111 mmol/L   CO2 26 22 - 32 mmol/L   Glucose, Bld 106 (H) 70 - 99 mg/dL   BUN 22 (H) 6 - 20 mg/dL   Creatinine, Ser 0.91 0.44 -  1.00 mg/dL   Calcium 9.2 8.9 - 10.3 mg/dL   GFR, Estimated >60 >60 mL/min   Anion gap 7 5 - 15  CBC with Differential/Platelet     Status: None   Collection Time: 06/28/20  4:58 AM  Result Value Ref Range   WBC 4.6 4.0 - 10.5 K/uL   RBC 3.89 3.87 - 5.11 MIL/uL   Hemoglobin 12.0 12.0 - 15.0 g/dL   HCT 36.0 36.0 - 46.0 %   MCV 92.5 80.0 - 100.0 fL   MCH 30.8 26.0 - 34.0 pg   MCHC 33.3 30.0 - 36.0  g/dL   RDW 13.1 11.5 - 15.5 %   Platelets 207 150 - 400 K/uL   nRBC 0.0 0.0 - 0.2 %   Neutrophils Relative % 63 %   Neutro Abs 2.9 1.7 - 7.7 K/uL   Lymphocytes Relative 26 %   Lymphs Abs 1.2 0.7 - 4.0 K/uL   Monocytes Relative 8 %   Monocytes Absolute 0.4 0.1 - 1.0 K/uL   Eosinophils Relative 3 %   Eosinophils Absolute 0.1 0.0 - 0.5 K/uL   Basophils Relative 0 %   Basophils Absolute 0.0 0.0 - 0.1 K/uL   Immature Granulocytes 0 %   Abs Immature Granulocytes 0.01 0.00 - 0.07 K/uL    Recent Results (from the past 240 hour(s))  Resp Panel by RT-PCR (Flu A&B, Covid) Nasopharyngeal Swab     Status: None   Collection Time: 06/26/20  3:17 PM   Specimen: Nasopharyngeal Swab; Nasopharyngeal(NP) swabs in vial transport medium  Result Value Ref Range Status   SARS Coronavirus 2 by RT PCR NEGATIVE NEGATIVE Final    Comment: (NOTE) SARS-CoV-2 target nucleic acids are NOT DETECTED.  The SARS-CoV-2 RNA is generally detectable in upper respiratory specimens during the acute phase of infection. The lowest concentration of SARS-CoV-2 viral copies this assay can detect is 138 copies/mL. A negative result does not preclude SARS-Cov-2 infection and should not be used as the sole basis for treatment or other patient management decisions. A negative result may occur with  improper specimen collection/handling, submission of specimen other than nasopharyngeal swab, presence of viral mutation(s) within the areas targeted by this assay, and inadequate number of viral copies(<138 copies/mL). A negative result must be combined with clinical observations, patient history, and epidemiological information. The expected result is Negative.  Fact Sheet for Patients:  EntrepreneurPulse.com.au  Fact Sheet for Healthcare Providers:  IncredibleEmployment.be  This test is no t yet approved or cleared by the [place] FDA and  has been authorized for detection and/or  diagnosis of SARS-CoV-2 by FDA under an Emergency Use Authorization (EUA). This EUA will remain  in effect (meaning this test can be used) for the duration of the COVID-19 declaration under Section 564(b)(1) of the Act, 21 U.S.C.section 360bbb-3(b)(1), unless the authorization is terminated  or revoked sooner.       Influenza A by PCR NEGATIVE NEGATIVE Final   Influenza B by PCR NEGATIVE NEGATIVE Final    Comment: (NOTE) The Xpert Xpress SARS-CoV-2/FLU/RSV plus assay is intended as an aid in the diagnosis of influenza from Nasopharyngeal swab specimens and should not be used as a sole basis for treatment. Nasal washings and aspirates are unacceptable for Xpert Xpress SARS-CoV-2/FLU/RSV testing.  Fact Sheet for Patients: EntrepreneurPulse.com.au  Fact Sheet for Healthcare Providers: IncredibleEmployment.be  This test is not yet approved or cleared by the [place] FDA and has been authorized for detection and/or diagnosis of SARS-CoV-2 by FDA  under an Emergency Use Authorization (EUA). This EUA will remain in effect (meaning this test can be used) for the duration of the COVID-19 declaration under Section 564(b)(1) of the Act, 21 U.S.C. section 360bbb-3(b)(1), unless the authorization is terminated or revoked.  Performed at [facility], [address], [place], [place] [address]      Radiology Studies: No results found. MR Angiogram Neck W or Wo Contrast  Final Result    MR BRAIN W WO CONTRAST  Final Result    MR ANGIO HEAD WO CONTRAST  Final Result      Scheduled Meds: . enoxaparin (LOVENOX) injection  40 mg Subcutaneous Q24H   PRN Meds: acetaminophen, gabapentin, hydrOXYzine, meclizine, ondansetron (ZOFRAN) IV, pantoprazole, sertraline Continuous Infusions:   LOS: 1 day  Time spent: Greater than 50% of the 35 minute visit was spent in counseling/coordination of care for the patient as laid out in the A&P.    Dwyane Dee, MD [facility] 06/28/2020, 3:13 PM

## 2020-06-28 NOTE — Progress Notes (Signed)
Physical Therapy Treatment Patient Details Name: Connie Cameron MRN: [id number] DOB: 10-07-1960 Today's Date: 06/28/2020    History of Present Illness The patient is a 60 yo female that presented to ED with dizziness, vomiting. 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    PT Comments    Patient alert, agreeable to PT. Denied symptoms at rest; but with all transfers and mobility did report increased dizziness. Overall the patient demonstrated improved mobility, rolling and sidelying to sit with CGA, sit <> stand with CGA and RW, and she was able to take several steps forwards/backwards at EOB as well. L dix hallpike and epley x1 performed, pt reported significant symptoms with second bout, did vomit at end of session. Further mobility attempts deferred. RN notified of pt request for medication. The patient would benefit from further skilled PT intervention to continue to progress towards goals, recommendation remains appropriate pending further pt progress.      Follow Up Recommendations  SNF;Supervision - Intermittent     Equipment Recommendations  Rolling walker with 5" wheels    Recommendations for Other Services       Precautions / Restrictions Precautions Precautions: Fall Restrictions Weight Bearing Restrictions: No    Mobility  Bed Mobility Overal bed mobility: Needs Assistance Bed Mobility: Sit to Supine;Sidelying to Sit;Rolling Rolling: Min guard Sidelying to sit: Min guard;HOB elevated   Sit to supine: Supervision;HOB elevated   General bed mobility comments: pt able to perform with less symptoms this AM    Transfers Overall transfer level: Needs assistance Equipment used: Rolling walker (2 wheeled);1 person hand held assist Transfers: Sit to/from Stand Sit to Stand: Min guard         General transfer comment: able to perform with handheld assist as well as RW, no physical assistance, CGA for safety due to  symptoms  Ambulation/Gait   Gait Distance (Feet): 3 Feet         General Gait Details: Pt able to step forwards/backwards at bedside with RW and CGA. Symptoms present, but more mild compared to previous session   Stairs             Wheelchair Mobility    Modified Rankin (Stroke Patients Only)       Balance Overall balance assessment: Needs assistance Sitting-balance support: No upper extremity supported;Feet supported Sitting balance-Leahy Scale: Good     Standing balance support: Bilateral upper extremity supported Standing balance-Leahy Scale: Poor Standing balance comment: reliant on UE support due to dizziness                            Cognition Arousal/Alertness: Awake/alert Behavior During Therapy: WFL for tasks assessed/performed Overall Cognitive Status: Within Functional Limits for tasks assessed                                 General Comments: Pleasant and agreeable to therapy despite experiencing nausea and dizziness      Exercises Other Exercises Other Exercises: L dix hallpike and eplyx1. Pt vomited this AM, further attempts deferred. Pt attempted to stand with PT as well at end of session, but ultimately returned to supine.    General Comments        Pertinent Vitals/Pain Pain Assessment: No/denies pain    Home Living  Prior Function            PT Goals (current goals can now be found in the care plan section) Progress towards PT goals: Progressing toward goals    Frequency           PT Plan Current plan remains appropriate    Co-evaluation              AM-PAC PT "6 Clicks" Mobility   Outcome Measure  Help needed turning from your back to your side while in a flat bed without using bedrails?: A Little Help needed moving from lying on your back to sitting on the side of a flat bed without using bedrails?: A Little Help needed moving to and from a bed to a  chair (including a wheelchair)?: A Little Help needed standing up from a chair using your arms (e.g., wheelchair or bedside chair)?: A Little Help needed to walk in hospital room?: A Lot Help needed climbing 3-5 steps with a railing? : Total 6 Click Score: 15    End of Session Equipment Utilized During Treatment: Gait belt Activity Tolerance: Other (comment) (limited by nausea, dizziness) Patient left: in bed;with call bell/phone within reach;with bed alarm set Nurse Communication: Mobility status PT Visit Diagnosis: Other abnormalities of gait and mobility (R26.89);Difficulty in walking, not elsewhere classified (R26.2);Muscle weakness (generalized) (M62.81)     Time: 7414-2395 PT Time Calculation (min) (ACUTE ONLY): 38 min  Charges:  $Therapeutic Activity: 8-22 mins $Canalith Rep Proc: 23-37 mins                     Lieutenant Diego PT, DPT 10:45 AM,06/28/20

## 2020-06-29 DIAGNOSIS — H8110 Benign paroxysmal vertigo, unspecified ear: Principal | ICD-10-CM

## 2020-06-29 LAB — BASIC METABOLIC PANEL
Anion gap: 6 (ref 5–15)
BUN: 22 mg/dL — ABNORMAL HIGH (ref 6–20)
CO2: 28 mmol/L (ref 22–32)
Calcium: 9.6 mg/dL (ref 8.9–10.3)
Chloride: 107 mmol/L (ref 98–111)
Creatinine, Ser: 1.12 mg/dL — ABNORMAL HIGH (ref 0.44–1.00)
GFR, Estimated: 56 mL/min — ABNORMAL LOW (ref >60)
Glucose, Bld: 123 mg/dL — ABNORMAL HIGH (ref 70–99)
Potassium: 4.8 mmol/L (ref 3.5–5.1)
Sodium: 141 mmol/L (ref 135–145)

## 2020-06-29 LAB — CBC WITH DIFFERENTIAL/PLATELET
Abs Immature Granulocytes: 0.01 10*3/uL (ref 0.00–0.07)
Basophils Absolute: 0 10*3/uL (ref 0.0–0.1)
Basophils Relative: 1 %
Eosinophils Absolute: 0.1 10*3/uL (ref 0.0–0.5)
Eosinophils Relative: 3 %
HCT: 37.4 % (ref 36.0–46.0)
Hemoglobin: 12.2 g/dL (ref 12.0–15.0)
Immature Granulocytes: 0 %
Lymphocytes Relative: 21 %
Lymphs Abs: 1 10*3/uL (ref 0.7–4.0)
MCH: 30.6 pg (ref 26.0–34.0)
MCHC: 32.6 g/dL (ref 30.0–36.0)
MCV: 93.7 fL (ref 80.0–100.0)
Monocytes Absolute: 0.4 10*3/uL (ref 0.1–1.0)
Monocytes Relative: 9 %
Neutro Abs: 3 10*3/uL (ref 1.7–7.7)
Neutrophils Relative %: 66 %
Platelets: 205 10*3/uL (ref 150–400)
RBC: 3.99 MIL/uL (ref 3.87–5.11)
RDW: 12.9 % (ref 11.5–15.5)
WBC: 4.5 10*3/uL (ref 4.0–10.5)
nRBC: 0 % (ref 0.0–0.2)

## 2020-06-29 LAB — MAGNESIUM: Magnesium: 2.1 mg/dL (ref 1.7–2.4)

## 2020-06-29 MED ORDER — ONDANSETRON HCL 4 MG/2ML IJ SOLN
4.0000 mg | Freq: Four times a day (QID) | INTRAMUSCULAR | Status: DC | PRN
  Administered 2020-06-29 – 2020-06-30 (×4): 4 mg via INTRAVENOUS
  Filled 2020-06-29 (×3): qty 2

## 2020-06-29 NOTE — Progress Notes (Signed)
Physical Therapy Treatment Patient Details Name: Connie Cameron MRN: [id number] DOB: August 11, 1960 Today's Date: 06/29/2020    History of Present Illness The patient is a 60 yo female that presented to ED with dizziness, vomiting. 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    PT Comments    Patient alert, agreeable to vestibular assessment and intervention. Bed mobility with CGA for safety, and pt reported nausea at end of session, comfort measures provided. Vestibular interventions performed by Lady Deutscher PT, DPT.    BPPV TESTS:  Symptoms Duration Intensity Nystagmus  Left Dix-Hallpike    deferred  Right Dix-Hallpike Vertigo and nausea About 1 minute 3/10 Upbeating right torsional nystagmus of short duration  Left Head Roll    Deferred (pt reports turning head brings on moderate symptoms)  Right Head Roll Vertigo and nausea About 1 minute 5/10 Apogeotropic nystagmus    Gufoni Maneuver: Patient with apogeotropic nystagmus noted during head roll test. Performed right Gufoni maneuver maneuver with 2 minute holds in each position. Performed 2 full Gufoni treatment maneuvers. Patient reported 3/10 vertigo with first maneuver and 0/10 with second maneuver.   Canalith Repositioning Maneuver: On inverted hospital bed performed right Dix-Hallpike testing and it was positive with right upbeating torsional nystagmus of short duration without latency. Performed right canalith repositioning maneuver (CRT). Patient reported 3/10 vertigo and nausea.   Patient tolerated canal testing and treatment maneuvers well this date with patient reporting mild nausea and vertigo. Patient able to tolerate 3 treatment maneuvers. Patient would benefit from continued PT services to further assess patient's symptoms of vertigo and dizziness and goals as set on plan of care. Will plan on repeat canal testing and performing treatment maneuvers as indicated.     Follow Up  Recommendations  SNF;Supervision - Intermittent     Equipment Recommendations  Rolling walker with 5" wheels    Recommendations for Other Services       Precautions / Restrictions Precautions Precautions: Fall Restrictions Weight Bearing Restrictions: No    Mobility  Bed Mobility Overal bed mobility: Needs Assistance Bed Mobility: Rolling;Sidelying to Sit;Supine to Sit Rolling: Min guard Sidelying to sit: Min guard Supine to sit: Supervision Sit to supine: Supervision   General bed mobility comments: CGA for safety during vestibular intervention    Transfers                    Ambulation/Gait                 Stairs             Wheelchair Mobility    Modified Rankin (Stroke Patients Only)       Balance Overall balance assessment: Needs assistance Sitting-balance support: Feet supported Sitting balance-Leahy Scale: Good Sitting balance - Comments: With increased time, pt able to independently weight shift to adjust linens while seated in bed                                    Cognition Arousal/Alertness: Awake/alert Behavior During Therapy: WFL for tasks assessed/performed Overall Cognitive Status: Within Functional Limits for tasks assessed                                        Exercises Other Exercises Other Exercises: horizontal canal assessment gufoni manuever  twice, R dix hallpike performed    General Comments        Pertinent Vitals/Pain Pain Assessment: No/denies pain    Home Living                      Prior Function            PT Goals (current goals can now be found in the care plan section) Progress towards PT goals: Progressing toward goals    Frequency           PT Plan Current plan remains appropriate    Co-evaluation              AM-PAC PT "6 Clicks" Mobility   Outcome Measure  Help needed turning from your back to your side while in a flat bed  without using bedrails?: A Little Help needed moving from lying on your back to sitting on the side of a flat bed without using bedrails?: A Little Help needed moving to and from a bed to a chair (including a wheelchair)?: A Little Help needed standing up from a chair using your arms (e.g., wheelchair or bedside chair)?: A Little Help needed to walk in hospital room?: A Lot Help needed climbing 3-5 steps with a railing? : Total 6 Click Score: 15    End of Session Equipment Utilized During Treatment: Gait belt Activity Tolerance: Other (comment) (limited by nausea, dizziness) Patient left: in bed;with call bell/phone within reach;with bed alarm set Nurse Communication: Mobility status PT Visit Diagnosis: Other abnormalities of gait and mobility (R26.89);Difficulty in walking, not elsewhere classified (R26.2);Muscle weakness (generalized) (M62.81)     Time: 4970-2637 PT Time Calculation (min) (ACUTE ONLY): 39 min  Charges:  $Canalith Rep Proc: 23-37 mins                     Lieutenant Diego PT, DPT 12:46 PM,06/29/20

## 2020-06-29 NOTE — Progress Notes (Signed)
Progress Note    TATEM HOLSONBACK   ZOX:[id number]  DOB: 1961-01-05  DOA: 06/26/2020     2  PCP: [place], [place]  CC: vertigo  Hospital Course: ZIERRA LAROQUE is a 60 y.o. 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 Patient presented to the ED on 5/23 with sudden onset of nausea, vomiting, vertigo.  Dizziness is worse upon turning her head to the left.  No other associated neurological symptoms.  In the ER, patient was afebrile, blood pressure elevated to 160/93. Labs mostly unremarkable MRI brain negative. MRA head and neck negative.  She underwent evaluation with vestibular PT and had positive Dix-Hallpike testing.  She did not tolerate Epley maneuvers initially and required further vestibular PT.  Interval History:  No events overnight.  She vomited with vestibular PT yesterday during maneuvers.  She is motivated for attempting again today.  She still wants to go home at time of discharge and is declining rehab. We discussed remaining in the hospital again for further interventions with PT.  ROS: Constitutional: negative for chills and fevers, Respiratory: negative for cough, Cardiovascular: negative for chest pain and Gastrointestinal: negative for abdominal pain  Assessment & Plan: BPPV -Negative MRI of brain and MRA head and neck. Marye Round testing positive - continue Epley maneuvers and others as appropriate with vestibular PT; may need SNF if not great improvement; however, she wishes to still discharge home when hospitalization concluded - continue meclizine PRN and Zofran as needed  Carcinoma of base of tongue  -s/p surgery, radiation and chemotherapy.  Currently in remission -f/u with Dr. Mike Gip  GERD -Protonix  Depression with anxiety -Continue home medications  Old records reviewed in assessment of this  patient  Antimicrobials: n/a  DVT prophylaxis: enoxaparin (LOVENOX) injection 40 mg Start: 06/26/20 2200   Code Status:   Code Status: Full Code Family Communication:   Disposition Plan: Status is: Inpatient  Remains inpatient appropriate because:Inpatient level of care appropriate due to severity of illness   Dispo: The patient is from: Home              Anticipated d/c is to: Likely home with home health              Patient currently is not medically stable to d/c.   Difficult to place patient No  Risk of unplanned readmission score: Unplanned Admission- Pilot do not use: 11.33   Objective: Blood pressure 110/80, pulse 68, temperature 97.7 F (36.5 C), resp. rate 17, height 5\' 1"  (1.549 m), weight 66 kg, SpO2 100 %.  Examination: General appearance: alert, cooperative and no distress Head: Normocephalic, without obvious abnormality, atraumatic Eyes: EOMI; no nystagmus at rest or with testing basic EOM Lungs: clear to auscultation bilaterally Heart: regular rate and rhythm and S1, S2 normal Abdomen: normal findings: bowel sounds normal and soft, non-tender Extremities: no edema Skin: mobility and turgor normal Neurologic: Grossly normal  Consultants:   Vestibular PT  Procedures:     Data Reviewed: I have personally reviewed following labs and imaging studies Results for orders placed or performed during the hospital encounter of 06/26/20 (from the past 24 hour(s))  Basic metabolic panel     Status: Abnormal   Collection Time: 06/29/20  4:54 AM  Result Value Ref Range   Sodium 141 135 - 145 mmol/L   Potassium 4.8 3.5 - 5.1 mmol/L   Chloride 107 98 -  111 mmol/L   CO2 28 22 - 32 mmol/L   Glucose, Bld 123 (H) 70 - 99 mg/dL   BUN 22 (H) 6 - 20 mg/dL   Creatinine, Ser 1.12 (H) 0.44 - 1.00 mg/dL   Calcium 9.6 8.9 - 10.3 mg/dL   GFR, Estimated 56 (L) >60 mL/min   Anion gap 6 5 - 15  CBC with Differential/Platelet     Status: None   Collection Time: 06/29/20   4:54 AM  Result Value Ref Range   WBC 4.5 4.0 - 10.5 K/uL   RBC 3.99 3.87 - 5.11 MIL/uL   Hemoglobin 12.2 12.0 - 15.0 g/dL   HCT 37.4 36.0 - 46.0 %   MCV 93.7 80.0 - 100.0 fL   MCH 30.6 26.0 - 34.0 pg   MCHC 32.6 30.0 - 36.0 g/dL   RDW 12.9 11.5 - 15.5 %   Platelets 205 150 - 400 K/uL   nRBC 0.0 0.0 - 0.2 %   Neutrophils Relative % 66 %   Neutro Abs 3.0 1.7 - 7.7 K/uL   Lymphocytes Relative 21 %   Lymphs Abs 1.0 0.7 - 4.0 K/uL   Monocytes Relative 9 %   Monocytes Absolute 0.4 0.1 - 1.0 K/uL   Eosinophils Relative 3 %   Eosinophils Absolute 0.1 0.0 - 0.5 K/uL   Basophils Relative 1 %   Basophils Absolute 0.0 0.0 - 0.1 K/uL   Immature Granulocytes 0 %   Abs Immature Granulocytes 0.01 0.00 - 0.07 K/uL  Magnesium     Status: None   Collection Time: 06/29/20  4:54 AM  Result Value Ref Range   Magnesium 2.1 1.7 - 2.4 mg/dL    Recent Results (from the past 240 hour(s))  Resp Panel by RT-PCR (Flu A&B, Covid) Nasopharyngeal Swab     Status: None   Collection Time: 06/26/20  3:17 PM   Specimen: Nasopharyngeal Swab; Nasopharyngeal(NP) swabs in vial transport medium  Result Value Ref Range Status   SARS Coronavirus 2 by RT PCR NEGATIVE NEGATIVE Final    Comment: (NOTE) SARS-CoV-2 target nucleic acids are NOT DETECTED.  The SARS-CoV-2 RNA is generally detectable in upper respiratory specimens during the acute phase of infection. The lowest concentration of SARS-CoV-2 viral copies this assay can detect is 138 copies/mL. A negative result does not preclude SARS-Cov-2 infection and should not be used as the sole basis for treatment or other patient management decisions. A negative result may occur with  improper specimen collection/handling, submission of specimen other than nasopharyngeal swab, presence of viral mutation(s) within the areas targeted by this assay, and inadequate number of viral copies(<138 copies/mL). A negative result must be combined with clinical observations,  patient history, and epidemiological information. The expected result is Negative.  Fact Sheet for Patients:  EntrepreneurPulse.com.au  Fact Sheet for Healthcare Providers:  IncredibleEmployment.be  This test is no t yet approved or cleared by the [place] FDA and  has been authorized for detection and/or diagnosis of SARS-CoV-2 by FDA under an Emergency Use Authorization (EUA). This EUA will remain  in effect (meaning this test can be used) for the duration of the COVID-19 declaration under Section 564(b)(1) of the Act, 21 U.S.C.section 360bbb-3(b)(1), unless the authorization is terminated  or revoked sooner.       Influenza A by PCR NEGATIVE NEGATIVE Final   Influenza B by PCR NEGATIVE NEGATIVE Final    Comment: (NOTE) The Xpert Xpress SARS-CoV-2/FLU/RSV plus assay is intended as an aid in the diagnosis  of influenza from Nasopharyngeal swab specimens and should not be used as a sole basis for treatment. Nasal washings and aspirates are unacceptable for Xpert Xpress SARS-CoV-2/FLU/RSV testing.  Fact Sheet for Patients: EntrepreneurPulse.com.au  Fact Sheet for Healthcare Providers: IncredibleEmployment.be  This test is not yet approved or cleared by the [place] FDA and has been authorized for detection and/or diagnosis of SARS-CoV-2 by FDA under an Emergency Use Authorization (EUA). This EUA will remain in effect (meaning this test can be used) for the duration of the COVID-19 declaration under Section 564(b)(1) of the Act, 21 U.S.C. section 360bbb-3(b)(1), unless the authorization is terminated or revoked.  Performed at [facility], [address], [place], [place] [address]      Radiology Studies: No results found. MR Angiogram Neck W or Wo Contrast  Final Result    MR BRAIN W WO CONTRAST  Final Result    MR ANGIO HEAD WO CONTRAST  Final Result      Scheduled  Meds: . enoxaparin (LOVENOX) injection  40 mg Subcutaneous Q24H   PRN Meds: acetaminophen, gabapentin, hydrOXYzine, meclizine, ondansetron (ZOFRAN) IV, pantoprazole, sertraline Continuous Infusions:   LOS: 2 days  Time spent: Greater than 50% of the 35 minute visit was spent in counseling/coordination of care for the patient as laid out in the A&P.   Dwyane Dee, MD [facility] 06/29/2020, 3:05 PM

## 2020-06-29 NOTE — Progress Notes (Signed)
Physical Therapy Treatment Patient Details Name: Connie Cameron MRN: [id number] DOB: 1960-06-10 Today's Date: 06/29/2020    History of Present Illness The patient is a 60 yo female that presented to ED with dizziness, vomiting. 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    PT Comments    Pt in bed, agreeable to second session of vestibular intervention. Reported dizziness when attempting to sit up to each lunch earlier with nursing staff. Vestibular assessment and intervention performed by Lady Deutscher PT, DPT.   This pm, repeated canal testing and both left and right head roll tests as well as left Dix-Hallpike tests were negative. Patient with positive Right Dix-Hallpike test and performed two treatment maneuvers. On the second maneuver, patient denies vertigo and dizziness and no nystagmus was observed. Patient denied nausea this afternoon and demonstrated good progress with particle clearance as evidenced by canal testing results and response during Right Epley maneuver.   BPPV TESTS:  Symptoms Duration Intensity Nystagmus  Left Dix-Hallpike none N/A 0/10 No nystagmus observed  Right Dix-Hallpike Vertigo  About 30 seconds 2/10 Upbeating right torsional nystagmus of short duration  Left Head Roll none N/A 0/10 No nystagmus observed  Right Head Roll none N/A 0/10 No nystagmus observed   Canalith Repositioning Maneuver: On inverted hospital bed, performed right Dix-Hallpike testing and it was positive with right upbeating torsional nystagmus of short duration without latency. Performed right canalith repositioning maneuver (CRT). Performed a total of two treatment maneuvers with patient reported 2/10 vertigo on the first maneuver and reporting no vertigo and no nystagmus was observed on the second maneuver, but performed to try to ensure full clearance of particles.       Follow Up Recommendations  SNF;Supervision - Intermittent     Equipment  Recommendations  Rolling walker with 5" wheels    Recommendations for Other Services       Precautions / Restrictions Precautions Precautions: Fall Restrictions Weight Bearing Restrictions: No    Mobility  Bed Mobility Overal bed mobility: Needs Assistance Bed Mobility: Rolling;Sidelying to Sit;Supine to Sit;Sit to Supine Rolling: Min guard Sidelying to sit: Min guard Supine to sit: Min guard Sit to supine: Supervision   General bed mobility comments: CGA for safety during vestibular intervention    Transfers                    Ambulation/Gait                 Stairs             Wheelchair Mobility    Modified Rankin (Stroke Patients Only)       Balance Overall balance assessment: Needs assistance Sitting-balance support: Feet supported Sitting balance-Leahy Scale: Good Sitting balance - Comments: With increased time, pt able to independently weight shift to adjust linens while seated in bed                                    Cognition Arousal/Alertness: Awake/alert Behavior During Therapy: WFL for tasks assessed/performed Overall Cognitive Status: Within Functional Limits for tasks assessed                                        Exercises Other Exercises Other Exercises: vestibular intervention    General Comments  Pertinent Vitals/Pain Pain Assessment: No/denies pain    Home Living                      Prior Function            PT Goals (current goals can now be found in the care plan section) Progress towards PT goals: Progressing toward goals    Frequency    Min 2X/week      PT Plan Current plan remains appropriate    Co-evaluation              AM-PAC PT "6 Clicks" Mobility   Outcome Measure  Help needed turning from your back to your side while in a flat bed without using bedrails?: A Little Help needed moving from lying on your back to sitting on the  side of a flat bed without using bedrails?: A Little Help needed moving to and from a bed to a chair (including a wheelchair)?: A Little Help needed standing up from a chair using your arms (e.g., wheelchair or bedside chair)?: A Little Help needed to walk in hospital room?: A Lot Help needed climbing 3-5 steps with a railing? : Total 6 Click Score: 15    End of Session Equipment Utilized During Treatment: Gait belt Activity Tolerance: Patient tolerated treatment well (limited by nausea, dizziness) Patient left: in bed;with call bell/phone within reach;with bed alarm set Nurse Communication: Mobility status PT Visit Diagnosis: Other abnormalities of gait and mobility (R26.89);Difficulty in walking, not elsewhere classified (R26.2);Muscle weakness (generalized) (M62.81)     Time: 4970-2637 PT Time Calculation (min) (ACUTE ONLY): 25 min  Charges:  $Canalith Rep Proc: 23-37 mins                     Lieutenant Diego PT, DPT 4:23 PM,06/29/20

## 2020-06-30 LAB — BASIC METABOLIC PANEL
Anion gap: 7 (ref 5–15)
BUN: 20 mg/dL (ref 6–20)
CO2: 29 mmol/L (ref 22–32)
Calcium: 9.6 mg/dL (ref 8.9–10.3)
Chloride: 106 mmol/L (ref 98–111)
Creatinine, Ser: 1.01 mg/dL — ABNORMAL HIGH (ref 0.44–1.00)
GFR, Estimated: >60 mL/min (ref >60)
Glucose, Bld: 89 mg/dL (ref 70–99)
Potassium: 4.9 mmol/L (ref 3.5–5.1)
Sodium: 142 mmol/L (ref 135–145)

## 2020-06-30 LAB — CBC WITH DIFFERENTIAL/PLATELET
Abs Immature Granulocytes: 0.02 10*3/uL (ref 0.00–0.07)
Basophils Absolute: 0 10*3/uL (ref 0.0–0.1)
Basophils Relative: 1 %
Eosinophils Absolute: 0.2 10*3/uL (ref 0.0–0.5)
Eosinophils Relative: 3 %
HCT: 39.1 % (ref 36.0–46.0)
Hemoglobin: 12.8 g/dL (ref 12.0–15.0)
Immature Granulocytes: 0 %
Lymphocytes Relative: 20 %
Lymphs Abs: 1.1 10*3/uL (ref 0.7–4.0)
MCH: 30.5 pg (ref 26.0–34.0)
MCHC: 32.7 g/dL (ref 30.0–36.0)
MCV: 93.1 fL (ref 80.0–100.0)
Monocytes Absolute: 0.5 10*3/uL (ref 0.1–1.0)
Monocytes Relative: 10 %
Neutro Abs: 3.7 10*3/uL (ref 1.7–7.7)
Neutrophils Relative %: 66 %
Platelets: 220 10*3/uL (ref 150–400)
RBC: 4.2 MIL/uL (ref 3.87–5.11)
RDW: 12.8 % (ref 11.5–15.5)
WBC: 5.5 10*3/uL (ref 4.0–10.5)
nRBC: 0 % (ref 0.0–0.2)

## 2020-06-30 LAB — MAGNESIUM: Magnesium: 2.1 mg/dL (ref 1.7–2.4)

## 2020-06-30 MED ORDER — MECLIZINE HCL 12.5 MG PO TABS: 12.5000 mg | ORAL_TABLET | Freq: Three times a day (TID) | ORAL | 0 refills | Status: AC | PRN

## 2020-06-30 NOTE — TOC Transition Note (Signed)
Transition of Care [facility]) - CM/SW Discharge Note   Patient Details  Name: Connie Cameron MRN: [id number] Date of Birth: 1960/06/25  [facility] CM/SW Contact:  Shelbie Hutching, RN Phone Number: 06/30/2020, 1:24 PM   Clinical Narrative:    Patient is medically cleared for discharge today home.  PT is recommending OP physical therapy and patient agrees.  OP PT referral sent down to [facility] OP Therapy.     Final next level of care: OP Rehab Barriers to Discharge: Barriers Resolved   Patient Goals and CMS Choice Patient states their goals for this hospitalization and ongoing recovery are:: to feel better and go home      Discharge Placement                       Discharge Plan and Services   Discharge Planning Services: CM Consult            DME Arranged: N/A DME Agency: NA       HH Arranged: NA          Social Determinants of Health (SDOH) Interventions     Readmission Risk Interventions No flowsheet data found.

## 2020-06-30 NOTE — Progress Notes (Signed)
Physical Therapy Treatment Patient Details Name: Connie Cameron MRN: [id number] DOB: 1960-08-30 Today's Date: 06/30/2020    History of Present Illness The patient is a 60 yo female that presented to ED with dizziness, vomiting. 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    PT Comments    Patient alert, up in bathroom with OT. Pt reported no nausea/dizziness today, or throughout mobility. She was able to ambulate ~18ft with no AD, supervision for safety. Decreased trunk rotation, arm swing, and velocity noted. Pt also had the tendency to turn trunk/body instead of turning head to avoid symptoms. Pt up in chair with all needs in reach at end of session. Overall the patient has demonstrated excellent progress and plan updated to home with outpatient vestibular therapy, pt agreeable to plan.      Follow Up Recommendations  Outpatient PT (vestibular therapy)     Equipment Recommendations  None recommended by PT    Recommendations for Other Services       Precautions / Restrictions Precautions Precautions: Fall Restrictions Weight Bearing Restrictions: No    Mobility  Bed Mobility               General bed mobility comments: Pt up in bathroom with OT at start of session    Transfers Overall transfer level: Needs assistance Equipment used: None Transfers: Sit to/from Stand Sit to Stand: Supervision            Ambulation/Gait   Gait Distance (Feet): 170 Feet Assistive device: None       General Gait Details: Pt with cautious, slow ambulation. Decreased arm swing, trunk rotation, decreased velocity. Pt turning whole body instead of head to avoid symptoms   Stairs             Wheelchair Mobility    Modified Rankin (Stroke Patients Only)       Balance Overall balance assessment: Needs assistance Sitting-balance support: Feet supported Sitting balance-Leahy Scale: Good     Standing balance support: No upper  extremity supported Standing balance-Leahy Scale: Good                              Cognition Arousal/Alertness: Awake/alert Behavior During Therapy: WFL for tasks assessed/performed Overall Cognitive Status: Within Functional Limits for tasks assessed                                        Exercises      General Comments        Pertinent Vitals/Pain Pain Assessment: No/denies pain    Home Living                      Prior Function            PT Goals (current goals can now be found in the care plan section) Progress towards PT goals: Progressing toward goals    Frequency    Min 2X/week      PT Plan Discharge plan needs to be updated    Co-evaluation              AM-PAC PT "6 Clicks" Mobility   Outcome Measure  Help needed turning from your back to your side while in a flat bed without using bedrails?: None Help needed moving from lying on your back  to sitting on the side of a flat bed without using bedrails?: None Help needed moving to and from a bed to a chair (including a wheelchair)?: None Help needed standing up from a chair using your arms (e.g., wheelchair or bedside chair)?: None Help needed to walk in hospital room?: None Help needed climbing 3-5 steps with a railing? : A Little 6 Click Score: 23    End of Session Equipment Utilized During Treatment: Gait belt Activity Tolerance: Patient tolerated treatment well Patient left: in chair;with call bell/phone within reach Nurse Communication: Mobility status PT Visit Diagnosis: Other abnormalities of gait and mobility (R26.89);Difficulty in walking, not elsewhere classified (R26.2);Muscle weakness (generalized) (M62.81)     Time: 0737-1062 PT Time Calculation (min) (ACUTE ONLY): 8 min  Charges:  $Therapeutic Exercise: 8-22 mins                     Lieutenant Diego PT, DPT 11:59 AM,06/30/20

## 2020-06-30 NOTE — Progress Notes (Signed)
Occupational Therapy Treatment Patient Details Name: Connie Cameron MRN: [id number] DOB: [birth year] Today's Date: 06/30/2020    History of present illness The patient is a 60 yo female that presented to ED with dizziness, vomiting. 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   OT comments  Pt seen for OT tx this date to f/u re: safety with ADLs/ADL mobility. Pt presents this date reporting s/s of vertigo resolved. Pt presents with no c/o dizziness with positional changes from sup to sit and sit to stand. Pt demos ability to perform bathing and dressing tasks at sink-side in standing with no AD, no physical assist from OT And progresses from SUPV to MOD I with occasional use of UE support on counter, but able to sustain static standing balance with no UE support. Pt has progressed in all aspects of self care and ADL mobility. All OT goals met, no further needs detected. Will complete order.    Follow Up Recommendations  No OT follow up    Equipment Recommendations  None recommended by OT    Recommendations for Other Services      Precautions / Restrictions Precautions Precautions: Fall Restrictions Weight Bearing Restrictions: No       Mobility Bed Mobility Overal bed mobility: Independent             General bed mobility comments: Pt up in bathroom with OT at start of session    Transfers Overall transfer level: Modified independent Equipment used: None Transfers: Sit to/from Stand Sit to Stand: Supervision              Balance Overall balance assessment: Needs assistance Sitting-balance support: Feet supported Sitting balance-Leahy Scale: Good     Standing balance support: No upper extremity supported Standing balance-Leahy Scale: Good                             ADL either performed or assessed with clinical judgement   ADL Overall ADL's : At baseline;Modified independent                                        General ADL Comments: pt stands at sink to bathe and dress with SETUP/SUPV only, no physical assist from OT, no LOB, no c/o dizziness, no use of AD.     Vision   Additional Comments: pt reports that vision back to her baseline/normal today 5/27   Perception     Praxis      Cognition Arousal/Alertness: Awake/alert Behavior During Therapy: [facility] for tasks assessed/performed Overall Cognitive Status: Within Functional Limits for tasks assessed                                          Exercises Other Exercises Other Exercises: OT engages pt in satnding bathing/dressing tasks sink-side with no AD with SUPV and SETUP only. Pt then performs fxl mobility to/from restroom and commode transfer and peri care with no AD and no physical assist, no c/o dizziness.   Shoulder Instructions       General Comments      Pertinent Vitals/ Pain       Pain Assessment: No/denies pain  Home Living  Prior Functioning/Environment              Frequency  Min 1X/week        Progress Toward Goals  OT Goals(current goals can now be found in the care plan section)  Progress towards OT goals: Goals met/education completed, patient discharged from [place] Discharge plan needs to be updated    Co-evaluation                 AM-PAC OT "6 Clicks" Daily Activity     Outcome Measure   Help from another person eating meals?: None Help from another person taking care of personal grooming?: None Help from another person toileting, which includes using toliet, bedpan, or urinal?: None Help from another person bathing (including washing, rinsing, drying)?: A Little Help from another person to put on and taking off regular upper body clothing?: None Help from another person to put on and taking off regular lower body clothing?: None 6 Click Score: 23    End of Session    OT Visit  Diagnosis: Dizziness and giddiness (R42)   Activity Tolerance Patient tolerated treatment well   Patient Left Other (comment) (standing in restroom with PT presenting for tx)   Nurse Communication Mobility status        Time: 1037-1100 OT Time Calculation (min): 23 min  Charges: OT General Charges $OT Visit: 1 Visit OT Treatments $Self Care/Home Management : 23-37 mins  Gerrianne Scale, MS, OTR/L ascom [phone] 06/30/20, 2:19 PM

## 2020-06-30 NOTE — Progress Notes (Signed)
Pt being discharged home, discharge instructions reviewed with pt, states understanding, pt with no complaints at this time

## 2020-06-30 NOTE — Discharge Summary (Signed)
Physician Discharge Summary   Connie Cameron HUD:[id number] DOB: [birth year] DOA: 06/26/2020  PCP: [facility]  Admit date: 06/26/2020 Discharge date:  06/30/2020   Admitted From: Home Disposition:  home Discharging physician: Dwyane Dee, MD  Recommendations for Outpatient Follow-up:  1. Follow-up with vestibular PT   Patient discharged to home in Discharge Condition: stable Risk of unplanned readmission score: Unplanned Admission- Pilot do not use: 10.12  CODE STATUS: Full Diet recommendation:  Diet Orders (From admission, onward)    Start     Ordered   06/26/20 1532  Diet regular Room service appropriate? Yes; Fluid consistency: Thin  Diet effective now       Question Answer Comment  Room service appropriate? 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 Patient presented to the ED on 5/23 with sudden onset of nausea, vomiting, vertigo. Dizziness is worse upon turning her head to the left. No other associated neurological symptoms.  In the ER, patient was afebrile, blood pressure elevated to 160/93. Labs mostly unremarkable MRI brain negative. MRA head and neck negative.  She underwent evaluation with vestibular PT and had positive Dix-Hallpike testing.  She did not tolerate Epley maneuvers initially and required further vestibular PT. With ongoing vestibular PT during hospitalization, she had further improvement and was able to be discharged home and will continue with outpatient vestibular PT.  BPPV -Negative MRI of brain and MRA head and neck. Marye Round testing positive - continue Epley maneuvers and others as appropriate with vestibular PT -Ongoing clinical improvement with ongoing work with PT.  Patient considered stable  for discharging home with outpatient vestibular PT follow-up - continue meclizine PRN and Zofran as needed  Carcinoma of base of tongue -s/p surgery, radiation and chemotherapy. Currently in remission -f/u with Dr. Mike Gip  GERD -Protonix  Depression with anxiety -Continue home medications   The patient's chronic medical conditions were treated accordingly per the patient's home medication regimen except as noted.  On day of discharge, patient was felt deemed stable for discharge. Patient/family member advised to call PCP or come back to ER if needed.   Principal Diagnosis: Vertigo  Discharge Diagnoses: Active Hospital Problems   Diagnosis Date Noted  . Vertigo 06/26/2020  . BPPV (benign paroxysmal positional vertigo) 06/29/2020  . GERD (gastroesophageal reflux disease) 06/26/2020  . Depression with anxiety 06/26/2020  . Carcinoma of base of tongue ([place]) 02/04/2019    Resolved Hospital Problems  No resolved problems to display.    Discharge Instructions    Increase activity slowly   Complete by: As directed      Allergies as of 06/30/2020      Reactions   Chlorhexidine    Pt doesn't have anything that requires CHG bath      Medication List    STOP taking these medications   Na Sulfate-K Sulfate-Mg Sulf 17.5-3.13-1.6 GM/177ML Soln     TAKE these medications   gabapentin 100 MG capsule Commonly known as: NEURONTIN Take 100 mg by mouth daily as needed.   hydrOXYzine 25 MG tablet Commonly known as: ATARAX/VISTARIL Take 25 mg by mouth daily as needed for nausea, vomiting or anxiety.   meclizine 25 MG tablet Commonly known as: ANTIVERT Take 1-2 tablets (25-50 mg total) by  mouth 3 (three) times daily as needed for dizziness.   meclizine 12.5 MG tablet Commonly known as: ANTIVERT Take 1 tablet (12.5 mg total) by mouth 3 (three) times daily as needed for dizziness or nausea.   omeprazole 20 MG capsule Commonly known as: PRILOSEC Take 1 capsule (20 mg  total) by mouth daily.   ondansetron 4 MG disintegrating tablet Commonly known as: Zofran ODT Take 1 tablet (4 mg total) by mouth every 6 (six) hours as needed for nausea or vomiting.   sertraline 25 MG tablet Commonly known as: ZOLOFT Take 25 mg by mouth daily.       Follow-up [place].   Why: As needed Contact information: [address] [place] [address] [phone]              Allergies  Allergen Reactions  . Chlorhexidine     Pt doesn't have anything that requires CHG bath    Consultations: n/a  Discharge Exam: BP 101/69 (BP Location: Right Arm)   Pulse 68   Temp 97.8 F (36.6 C)   Resp 18   Ht 5\' 1"  (1.549 m)   Wt 66 kg   SpO2 97%   BMI 27.49 kg/m  General appearance: alert, cooperative and no distress Head: Normocephalic, without obvious abnormality, atraumatic Eyes: EOMI; no nystagmus at rest or with testing basic EOM Lungs: clear to auscultation bilaterally Heart: regular rate and rhythm and S1, S2 normal Abdomen: normal findings: bowel sounds normal and soft, non-tender Extremities: no edema Skin: mobility and turgor normal Neurologic: Grossly normal  The results of significant diagnostics from this hospitalization (including imaging, microbiology, ancillary and laboratory) are listed below for reference.   Microbiology: Recent Results (from the past 240 hour(s))  Resp Panel by RT-PCR (Flu A&B, Covid) Nasopharyngeal Swab     Status: None   Collection Time: 06/26/20  3:17 PM   Specimen: Nasopharyngeal Swab; Nasopharyngeal(NP) swabs in vial transport medium  Result Value Ref Range Status   SARS Coronavirus 2 by RT PCR NEGATIVE NEGATIVE Final    Comment: (NOTE) SARS-CoV-2 target nucleic acids are NOT DETECTED.  The SARS-CoV-2 RNA is generally detectable in upper respiratory specimens during the acute phase of infection. The lowest concentration of SARS-CoV-2 viral copies this assay can detect is 138 copies/mL. A  negative result does not preclude SARS-Cov-2 infection and should not be used as the sole basis for treatment or other patient management decisions. A negative result may occur with  improper specimen collection/handling, submission of specimen other than nasopharyngeal swab, presence of viral mutation(s) within the areas targeted by this assay, and inadequate number of viral copies(<138 copies/mL). A negative result must be combined with clinical observations, patient history, and epidemiological information. The expected result is Negative.  Fact Sheet for Patients:  EntrepreneurPulse.com.au  Fact Sheet for Healthcare Providers:  IncredibleEmployment.be  This test is no t yet approved or cleared by the [place] FDA and  has been authorized for detection and/or diagnosis of SARS-CoV-2 by FDA under an Emergency Use Authorization (EUA). This EUA will remain  in effect (meaning this test can be used) for the duration of the COVID-19 declaration under Section 564(b)(1) of the Act, 21 U.S.C.section 360bbb-3(b)(1), unless the authorization is terminated  or revoked sooner.       Influenza A by PCR NEGATIVE NEGATIVE Final   Influenza B by PCR NEGATIVE NEGATIVE Final    Comment: (NOTE) The Xpert Xpress SARS-CoV-2/FLU/RSV plus assay is intended as  an aid in the diagnosis of influenza from Nasopharyngeal swab specimens and should not be used as a sole basis for treatment. Nasal washings and aspirates are unacceptable for Xpert Xpress SARS-CoV-2/FLU/RSV testing.  Fact Sheet for Patients: EntrepreneurPulse.com.au  Fact Sheet for Healthcare Providers: IncredibleEmployment.be  This test is not yet approved or cleared by the [place] FDA and has been authorized for detection and/or diagnosis of SARS-CoV-2 by FDA under an Emergency Use Authorization (EUA). This EUA will remain in effect (meaning this test can  be used) for the duration of the COVID-19 declaration under Section 564(b)(1) of the Act, 21 U.S.C. section 360bbb-3(b)(1), unless the authorization is terminated or revoked.  Performed at [facility], [address] [place], [place] [address]      Labs: BNP (last 3 results) No results for input(s): BNP in the last 8760 hours. Basic Metabolic Panel: Recent Labs  Lab 06/26/20 0505 06/27/20 0425 06/28/20 0458 06/29/20 0454 06/30/20 0512  NA 140 142 141 141 142  K 3.9 4.3 4.4 4.8 4.9  CL 107 109 108 107 106  CO2 22 26 26 28 29   GLUCOSE 212* 105* 106* 123* 89  BUN 30* 18 22* 22* 20  CREATININE 1.00 0.98 0.91 1.12* 1.01*  CALCIUM 9.5 9.0 9.2 9.6 9.6  MG  --   --   --  2.1 2.1   Liver Function Tests: No results for input(s): AST, ALT, ALKPHOS, BILITOT, PROT, ALBUMIN in the last 168 hours. No results for input(s): LIPASE, AMYLASE in the last 168 hours. No results for input(s): AMMONIA in the last 168 hours. CBC: Recent Labs  Lab 06/26/20 0505 06/28/20 0458 06/29/20 0454 06/30/20 0512  WBC 7.0 4.6 4.5 5.5  NEUTROABS 3.6 2.9 3.0 3.7  HGB 12.7 12.0 12.2 12.8  HCT 37.5 36.0 37.4 39.1  MCV 91.0 92.5 93.7 93.1  PLT 246 207 205 220   Cardiac Enzymes: No results for input(s): CKTOTAL, CKMB, CKMBINDEX, TROPONINI in the last 168 hours. BNP: Invalid input(s): POCBNP CBG: Recent Labs  Lab 06/26/20 0505  GLUCAP 175*   D-Dimer No results for input(s): DDIMER in the last 72 hours. Hgb A1c No results for input(s): HGBA1C in the last 72 hours. Lipid Profile No results for input(s): CHOL, HDL, LDLCALC, TRIG, CHOLHDL, LDLDIRECT in the last 72 hours. Thyroid function studies No results for input(s): TSH, T4TOTAL, T3FREE, THYROIDAB in the last 72 hours.  Invalid input(s): FREET3 Anemia work up No results for input(s): VITAMINB12, FOLATE, FERRITIN, TIBC, IRON, RETICCTPCT in the last 72 hours. Urinalysis    Component Value Date/Time   COLORURINE YELLOW (A)  06/26/2020 1359   APPEARANCEUR CLEAR (A) 06/26/2020 1359   APPEARANCEUR Hazy 06/24/2011 1709   LABSPEC 1.012 06/26/2020 1359   LABSPEC 1.010 06/24/2011 1709   PHURINE 6.0 06/26/2020 1359   GLUCOSEU NEGATIVE 06/26/2020 1359   GLUCOSEU Negative 06/24/2011 1709   HGBUR NEGATIVE 06/26/2020 1359   BILIRUBINUR NEGATIVE 06/26/2020 1359   BILIRUBINUR Negative 06/24/2011 1709   [place] 06/26/2020 1359   PROTEINUR NEGATIVE 06/26/2020 1359   NITRITE NEGATIVE 06/26/2020 1359   LEUKOCYTESUR MODERATE (A) 06/26/2020 1359   LEUKOCYTESUR 1+ 06/24/2011 1709   Sepsis Labs Invalid input(s): PROCALCITONIN,  WBC,  LACTICIDVEN Microbiology Recent Results (from the past 240 hour(s))  Resp Panel by RT-PCR (Flu A&B, Covid) Nasopharyngeal Swab     Status: None   Collection Time: 06/26/20  3:17 PM   Specimen: Nasopharyngeal Swab; Nasopharyngeal(NP) swabs in vial transport medium  Result Value Ref  Range Status   SARS Coronavirus 2 by RT PCR NEGATIVE NEGATIVE Final    Comment: (NOTE) SARS-CoV-2 target nucleic acids are NOT DETECTED.  The SARS-CoV-2 RNA is generally detectable in upper respiratory specimens during the acute phase of infection. The lowest concentration of SARS-CoV-2 viral copies this assay can detect is 138 copies/mL. A negative result does not preclude SARS-Cov-2 infection and should not be used as the sole basis for treatment or other patient management decisions. A negative result may occur with  improper specimen collection/handling, submission of specimen other than nasopharyngeal swab, presence of viral mutation(s) within the areas targeted by this assay, and inadequate number of viral copies(<138 copies/mL). A negative result must be combined with clinical observations, patient history, and epidemiological information. The expected result is Negative.  Fact Sheet for Patients:  EntrepreneurPulse.com.au  Fact Sheet for Healthcare Providers:   IncredibleEmployment.be  This test is no t yet approved or cleared by the [place] FDA and  has been authorized for detection and/or diagnosis of SARS-CoV-2 by FDA under an Emergency Use Authorization (EUA). This EUA will remain  in effect (meaning this test can be used) for the duration of the COVID-19 declaration under Section 564(b)(1) of the Act, 21 U.S.C.section 360bbb-3(b)(1), unless the authorization is terminated  or revoked sooner.       Influenza A by PCR NEGATIVE NEGATIVE Final   Influenza B by PCR NEGATIVE NEGATIVE Final    Comment: (NOTE) The Xpert Xpress SARS-CoV-2/FLU/RSV plus assay is intended as an aid in the diagnosis of influenza from Nasopharyngeal swab specimens and should not be used as a sole basis for treatment. Nasal washings and aspirates are unacceptable for Xpert Xpress SARS-CoV-2/FLU/RSV testing.  Fact Sheet for Patients: EntrepreneurPulse.com.au  Fact Sheet for Healthcare Providers: IncredibleEmployment.be  This test is not yet approved or cleared by the [place] FDA and has been authorized for detection and/or diagnosis of SARS-CoV-2 by FDA under an Emergency Use Authorization (EUA). This EUA will remain in effect (meaning this test can be used) for the duration of the COVID-19 declaration under Section 564(b)(1) of the Act, 21 U.S.C. section 360bbb-3(b)(1), unless the authorization is terminated or revoked.  Performed at [facility], [address] [place], [place] [address]     Procedures/Studies: CT SOFT TISSUE NECK W CONTRAST  Result Date: 06/14/2020 CLINICAL DATA:  Carcinoma of base of tongue. Left-sided or lesion. Additional history provided: History of carcinoma of base of tongue 2020 surgery and chemotherapy. Patient now reports a mass at the same area and left ear/neck pain. EXAM: CT NECK WITH CONTRAST TECHNIQUE: Multidetector CT imaging of the neck was  performed using the standard protocol following the bolus administration of intravenous contrast. CONTRAST:  21mL OMNIPAQUE IOHEXOL 300 MG/ML  SOLN COMPARISON:  Prior neck CT examinations 11/03/2019 and earlier. CT chest/abdomen/pelvis 05/11/2020. FINDINGS: Pharynx and larynx: Streak and beam hardening artifact from dental restoration partially obscures the oral cavity. No visible mass within the oral cavity/base of tongue. No appreciable mass within the pharynx or larynx. Salivary glands: Atrophy of the bilateral submandibular glands, likely related to prior radiation. The parotid glands are unremarkable. Thyroid: Unremarkable. Lymph nodes: No pathologically enlarged cervical chain lymph nodes. Vascular: The major vascular structures of the neck are patent. Limited intracranial: No evidence of acute intracranial abnormality within the field of view. Visualized orbits: Incompletely imaged. No mass or acute finding at the imaged levels. Mastoids and visualized paranasal sinuses: No significant paranasal sinus disease or mastoid effusion at the imaged  levels. Skeleton: Cervical spondylosis greatest at C5-C6. No acute bony abnormality or aggressive osseous lesion. Upper chest: No consolidation within the imaged lung apices. Partially imaged right chest infusion port catheter. IMPRESSION: Streak and beam hardening artifact arising from dental restoration partially obscures the oral cavity. No visible mass within the oral cavity/base of tongue. Correlate with findings on direct visualization. No cervical lymphadenopathy. Electronically Signed   By: Kellie Simmering DO   On: 06/14/2020 09:04   MR ANGIO HEAD WO CONTRAST  Result Date: 06/26/2020 CLINICAL DATA:  Acute vertigo.  History of head and neck cancer. EXAM: MRI HEAD WITHOUT AND WITH CONTRAST MRA HEAD WITHOUT CONTRAST MRA NECK WITHOUT AND WITH CONTRAST TECHNIQUE: Multiplanar, multiecho pulse sequences of the brain and surrounding structures were obtained without and  with intravenous contrast. Angiographic images of the Circle of Willis were obtained using MRA technique without intravenous contrast. Angiographic images of the neck were obtained using MRA technique without and with intravenous contrast. Carotid stenosis measurements (when applicable) are obtained utilizing NASCET criteria, using the distal internal carotid diameter as the denominator. CONTRAST:  76mL GADAVIST GADOBUTROL 1 MMOL/ML IV SOLN COMPARISON:  None. FINDINGS: MRI HEAD FINDINGS Brain: There is no evidence of an acute infarct, intracranial hemorrhage, mass, midline shift, or extra-axial fluid collection. The ventricles and sulci are normal. Scattered T2 hyperintensities in the cerebral white matter bilaterally are nonspecific but compatible with mild chronic small vessel ischemic disease. No abnormal enhancement is identified. No cerebellar insult is evident. Vascular: Major intracranial vascular flow voids are preserved. Skull and upper cervical spine: Unremarkable bone marrow signal. Sinuses/Orbits: Unremarkable orbits. Paranasal sinuses and mastoid air cells are clear. Other: None. MRA HEAD FINDINGS The intracranial vertebral arteries are widely patent to the basilar. Patent PICAs, AICAs, and SCAs are seen bilaterally with a common origin of the right AICA and PICA noted. The basilar artery is widely patent. Posterior communicating arteries are not identified and may be diminutive or absent. Both PCAs are patent without evidence of a significant proximal stenosis. The internal carotid arteries are widely patent from skull base to carotid termini. ACAs and MCAs are patent without evidence of a proximal branch occlusion or significant proximal stenosis. No aneurysm is identified. MRA NECK FINDINGS There is a standard 3 vessel aortic arch. The brachiocephalic and subclavian arteries are widely patent. The common carotid and cervical internal carotid arteries are patent without evidence of stenosis or  dissection. The vertebral arteries are patent and codominant with antegrade flow bilaterally. No vertebral artery dissection or stenosis is evident. IMPRESSION: 1. No acute intracranial abnormality. 2. Mild chronic small vessel ischemic disease. 3. Negative head MRA. 4. Negative neck MRA. Electronically Signed   By: Logan Bores M.D.   On: 06/26/2020 11:55   MR Angiogram Neck W or Wo Contrast  Result Date: 06/26/2020 CLINICAL DATA:  Acute vertigo.  History of head and neck cancer. EXAM: MRI HEAD WITHOUT AND WITH CONTRAST MRA HEAD WITHOUT CONTRAST MRA NECK WITHOUT AND WITH CONTRAST TECHNIQUE: Multiplanar, multiecho pulse sequences of the brain and surrounding structures were obtained without and with intravenous contrast. Angiographic images of the Circle of Willis were obtained using MRA technique without intravenous contrast. Angiographic images of the neck were obtained using MRA technique without and with intravenous contrast. Carotid stenosis measurements (when applicable) are obtained utilizing NASCET criteria, using the distal internal carotid diameter as the denominator. CONTRAST:  25mL GADAVIST GADOBUTROL 1 MMOL/ML IV SOLN COMPARISON:  None. FINDINGS: MRI HEAD FINDINGS Brain: There is no evidence  of an acute infarct, intracranial hemorrhage, mass, midline shift, or extra-axial fluid collection. The ventricles and sulci are normal. Scattered T2 hyperintensities in the cerebral white matter bilaterally are nonspecific but compatible with mild chronic small vessel ischemic disease. No abnormal enhancement is identified. No cerebellar insult is evident. Vascular: Major intracranial vascular flow voids are preserved. Skull and upper cervical spine: Unremarkable bone marrow signal. Sinuses/Orbits: Unremarkable orbits. Paranasal sinuses and mastoid air cells are clear. Other: None. MRA HEAD FINDINGS The intracranial vertebral arteries are widely patent to the basilar. Patent PICAs, AICAs, and SCAs are seen  bilaterally with a common origin of the right AICA and PICA noted. The basilar artery is widely patent. Posterior communicating arteries are not identified and may be diminutive or absent. Both PCAs are patent without evidence of a significant proximal stenosis. The internal carotid arteries are widely patent from skull base to carotid termini. ACAs and MCAs are patent without evidence of a proximal branch occlusion or significant proximal stenosis. No aneurysm is identified. MRA NECK FINDINGS There is a standard 3 vessel aortic arch. The brachiocephalic and subclavian arteries are widely patent. The common carotid and cervical internal carotid arteries are patent without evidence of stenosis or dissection. The vertebral arteries are patent and codominant with antegrade flow bilaterally. No vertebral artery dissection or stenosis is evident. IMPRESSION: 1. No acute intracranial abnormality. 2. Mild chronic small vessel ischemic disease. 3. Negative head MRA. 4. Negative neck MRA. Electronically Signed   By: Logan Bores M.D.   On: 06/26/2020 11:55   MR BRAIN W WO CONTRAST  Result Date: 06/26/2020 CLINICAL DATA:  Acute vertigo.  History of head and neck cancer. EXAM: MRI HEAD WITHOUT AND WITH CONTRAST MRA HEAD WITHOUT CONTRAST MRA NECK WITHOUT AND WITH CONTRAST TECHNIQUE: Multiplanar, multiecho pulse sequences of the brain and surrounding structures were obtained without and with intravenous contrast. Angiographic images of the Circle of Willis were obtained using MRA technique without intravenous contrast. Angiographic images of the neck were obtained using MRA technique without and with intravenous contrast. Carotid stenosis measurements (when applicable) are obtained utilizing NASCET criteria, using the distal internal carotid diameter as the denominator. CONTRAST:  21mL GADAVIST GADOBUTROL 1 MMOL/ML IV SOLN COMPARISON:  None. FINDINGS: MRI HEAD FINDINGS Brain: There is no evidence of an acute infarct,  intracranial hemorrhage, mass, midline shift, or extra-axial fluid collection. The ventricles and sulci are normal. Scattered T2 hyperintensities in the cerebral white matter bilaterally are nonspecific but compatible with mild chronic small vessel ischemic disease. No abnormal enhancement is identified. No cerebellar insult is evident. Vascular: Major intracranial vascular flow voids are preserved. Skull and upper cervical spine: Unremarkable bone marrow signal. Sinuses/Orbits: Unremarkable orbits. Paranasal sinuses and mastoid air cells are clear. Other: None. MRA HEAD FINDINGS The intracranial vertebral arteries are widely patent to the basilar. Patent PICAs, AICAs, and SCAs are seen bilaterally with a common origin of the right AICA and PICA noted. The basilar artery is widely patent. Posterior communicating arteries are not identified and may be diminutive or absent. Both PCAs are patent without evidence of a significant proximal stenosis. The internal carotid arteries are widely patent from skull base to carotid termini. ACAs and MCAs are patent without evidence of a proximal branch occlusion or significant proximal stenosis. No aneurysm is identified. MRA NECK FINDINGS There is a standard 3 vessel aortic arch. The brachiocephalic and subclavian arteries are widely patent. The common carotid and cervical internal carotid arteries are patent without evidence of stenosis or dissection.  The vertebral arteries are patent and codominant with antegrade flow bilaterally. No vertebral artery dissection or stenosis is evident. IMPRESSION: 1. No acute intracranial abnormality. 2. Mild chronic small vessel ischemic disease. 3. Negative head MRA. 4. Negative neck MRA. Electronically Signed   By: Logan Bores M.D.   On: 06/26/2020 11:55     Time coordinating discharge: Over 30 minutes    Dwyane Dee, MD  [facility] 06/30/2020, 3:01 PM

## 2020-07-05 ENCOUNTER — Encounter: Payer: Self-pay | Admitting: Oncology

## 2020-07-05 ENCOUNTER — Encounter: Payer: Self-pay | Admitting: Hematology and Oncology

## 2020-07-18 ENCOUNTER — Inpatient Hospital Stay: Payer: Medicaid Other | Attending: Nurse Practitioner

## 2020-07-18 DIAGNOSIS — C01 Malignant neoplasm of base of tongue: Secondary | ICD-10-CM | POA: Insufficient documentation

## 2020-07-18 DIAGNOSIS — E538 Deficiency of other specified B group vitamins: Secondary | ICD-10-CM | POA: Insufficient documentation

## 2020-07-18 DIAGNOSIS — Z79899 Other long term (current) drug therapy: Secondary | ICD-10-CM | POA: Insufficient documentation

## 2020-07-25 ENCOUNTER — Inpatient Hospital Stay: Payer: Medicaid Other

## 2020-07-25 ENCOUNTER — Other Ambulatory Visit: Payer: Self-pay

## 2020-07-25 DIAGNOSIS — Z95828 Presence of other vascular implants and grafts: Secondary | ICD-10-CM

## 2020-07-25 DIAGNOSIS — Z79899 Other long term (current) drug therapy: Secondary | ICD-10-CM | POA: Diagnosis not present

## 2020-07-25 DIAGNOSIS — E538 Deficiency of other specified B group vitamins: Secondary | ICD-10-CM | POA: Diagnosis not present

## 2020-07-25 DIAGNOSIS — C01 Malignant neoplasm of base of tongue: Secondary | ICD-10-CM | POA: Diagnosis present

## 2020-07-25 MED ORDER — CYANOCOBALAMIN 1000 MCG/ML IJ SOLN
1000.0000 ug | Freq: Once | INTRAMUSCULAR | Status: AC
  Administered 2020-07-25: 1000 ug via INTRAMUSCULAR

## 2020-07-25 MED ORDER — HEPARIN SOD (PORK) LOCK FLUSH 100 UNIT/ML IV SOLN
500.0000 [IU] | Freq: Once | INTRAVENOUS | Status: AC
Start: 2020-07-25 — End: 2020-07-25
  Administered 2020-07-25: 500 [IU] via INTRAVENOUS
  Filled 2020-07-25: qty 5

## 2020-07-25 MED ORDER — SODIUM CHLORIDE 0.9% FLUSH
10.0000 mL | INTRAVENOUS | Status: DC | PRN
  Administered 2020-07-25: 10 mL via INTRAVENOUS
  Filled 2020-07-25: qty 10

## 2020-07-25 NOTE — Patient Instructions (Addendum)
Cyanocobalamin, Vitamin B12 injection O que  este medicamento? A CIANOCOBALAMINA  uma forma sinttica de vitamina B12. A vitamina B12  essencial para o desenvolvimento de glbulVitamin B12 Injection What is this medication? Vitamin B12 (VAHY tuh min B12) prevents and treats low vitamin B12 levels in your body. It is used in people who do not get enough vitamin B12 from their diet or when their digestive tract does not absorb enough. Vitamin B12 plays an important role in maintaining the health of your nervous system and red bloodcells. This medicine may be used for other purposes; ask your health care provider orpharmacist if you have questions. COMMON BRAND NAME(S): B-12 Compliance Kit, B-12 Injection Kit, Cyomin, LA-12,Nutri-Twelve, Physicians EZ Use B-12, Primabalt What should I tell my care team before I take this medication? They need to know if you have any of these conditions: Kidney disease Leber's disease Megaloblastic anemia An unusual or allergic reaction to cyanocobalamin, cobalt, other medications, foods, dyes, or preservatives Pregnant or trying to get pregnant Breast-feeding How should I use this medication? This medication is injected into a muscle or deeply under the skin. It is usually given in a clinic or care team's office. However, your care team Esperanza Sheets you how to inject yourself. Follow all instructions. Talk to your care team about the use of this medication in children. Specialcare may be needed. Overdosage: If you think you have taken too much of this medicine contact apoison control center or emergency room at once. NOTE: This medicine is only for you. Do not share this medicine with others. What if I miss a dose? If you are given your dose at a clinic or care team's office, call to reschedule your appointment. If you give your own injections, and you miss a dose, take it as soon as you can. If it is almost time for your next dose, takeonly that dose. Do not take  double or extra doses. What may interact with this medication? Colchicine Heavy alcohol intake This list may not describe all possible interactions. Give your health care provider a list of all the medicines, herbs, non-prescription drugs, or dietary supplements you use. Also tell them if you smoke, drink alcohol, or use illegaldrugs. Some items may interact with your medicine. What should I watch for while using this medication? Visit your care team regularly. You may need blood work done while you aretaking this medication. You may need to follow a special diet. Talk to your care team. Limit youralcohol intake and avoid smoking to get the best benefit. What side effects may I notice from receiving this medication? Side effects that you should report to your care team as soon as possible: Allergic reactions-skin rash, itching, hives, swelling of the face, lips, tongue, or throat Swelling of the ankles, hands, or feet Trouble breathing Side effects that usually do not require medical attention (report to your careteam if they continue or are bothersome): Diarrhea This list may not describe all possible side effects. Call your doctor for medical advice about side effects. You may report side effects to FDA at1-800-FDA-1088. Where should I keep my medication? Keep out of the reach of children. Store at room temperature between 15 and 30 degrees C (59 and 85 degrees F).Protect from light. Throw away any unused medication after the expiration date. NOTE: This sheet is a summary. It may not cover all possible information. If you have questions about this medicine, talk to your doctor, pharmacist, orhealth care provider.  2022 Elsevier/Gold Standard (2020-03-13 11:47:06)  os sanguneos, clulas nervosas e protenas saudveis pelo organismo. Tambm ajuda no metabolismo das gorduras e carboidratos. Este medicamento  usado para tratar pessoas que no conseguem absorver vitamina B12 suficiente. Este  medicamento pode ser usado para outros propsitos; em caso de dvidas, pergunte ao seu profissional de sade ou farmacutico. NOMES DE MARCAS COMUNS: B-12 Compliance Kit, B-12 Injection Kit, Cyomin, LA-12, Nutri-Twelve, Physicians EZ Use B-12, Primabalt O que devo dizer a meu profissional de sade antes de tomar este medicamento? Precisam saber se voc tem algum dos seguintes problemas ou estados de sade: doenas renais sndrome ou doena de Leber anemia megaloblstica reao estranha ou alergia  cianocobalamina ou ao cobalto reao estranha ou alergia a outros medicamentos, alimentos, corantes ou conservantes est grvida ou tentando engravidar est amamentando Como devo usar este medicamento? Este medicamento  injetado por via intramuscular ou por injeo subcutnea profunda. Costuma ser administrado por um profissional de sade em consultrio ou Chief of Staff. Porm,  possvel que seu mdico lhe ensine como aplicar suas prprias injees. Siga todas as instrues. Fale com seu pediatra a respeito do uso deste medicamento em crianas. Pode ser preciso tomar alguns cuidados especiais. Superdosagem: Se achar que tomou uma superdosagem deste medicamento, entre em contato imediatamente com o [facility] ou v a [employer]. OBSERVAO: Este medicamento  s para voc. No compartilhe este medicamento com outras pessoas. E se eu deixar de tomar uma dose? Se toma o medicamento em uma clnica ou no consultrio do seu mdico, ligue para Paramedic a Passenger transport manager. Se aplica as injees por conta prpria e perdeu uma dose, tome-a assim que possvel. Se j estiver quase na hora da sua prxima dose, tome somente essa dose. No tome o remdio em dobro, nem tome uma dose adicional. O que pode interagir com este medicamento? colchicina consumo pesado de lcool Esta lista pode no descrever todas as interaes possveis. D ao seu profissional de sade uma lista de todos os medicamentos,  ervas medicinais, remdios de venda livre, ou suplementos alimentares que voc [place]. Diga tambm se voc fuma, bebe, ou [place] drogas ilcitas. Alguns destes podem interagir com o seu medicamento. Ao que devo ficar atento quando estiver [employer] medicamento? Consulte seu mdico ou profissional de sade para acompanhamento regular Museum/gallery curator. Voc precisar fazer exames de sangue peridicos enquanto estiver [employer]. Voc pode precisar seguir uma dieta especial. Fale com seu mdico. Para conseguir o mximo benefcio deste medicamento, limite o seu consumo de lcool e evite fumar. Que efeitos colaterais posso sentir aps usar este medicamento? Efeitos colaterais que devem ser informados ao seu mdico ou profissional de sade o mais rpido possvel: reaes alrgicas, como erupo na pele, coceira, urticria, ou inchao do rosto, dos lbios ou da lngua pele azulada dor ou aperto no peito chiado no peito ou dificuldade para respirar tontura rea vermelha, inchada e dolorosa na perna Efeitos colaterais que normalmente no precisam de cuidados mdicos (avise ao seu mdico ou profissional de sade se persistirem ou forem incmodos): diarreia dor de cabea Esta lista pode no descrever todos os efeitos colaterais possveis. Para mais orientaes sobre efeitos colaterais, consulte o seu mdico. Voc pode relatar a ocorrncia de efeitos colaterais  FDA pelo telefone [phone]. Onde devo guardar meu medicamento? [facility] fora do [employer]. Conservar em temperatura ambiente, entre 15 e 30 degreesC [phone] e 86 degreesF). Proteger Administrator, arts. Descartar qualquer medicamento no utilizado aps a data de validade impressa no rtulo ou embalagem. OBSERVAO: Este folheto  um  resumo. Pode no cobrir todas as informaes possveis. Se tiver dvidas a respeito deste medicamento, fale com seu mdico, farmacutico ou profissional de sade.  2021 Elsevier/Gold Standard (2009-10-25  00:00:00)

## 2020-08-03 ENCOUNTER — Other Ambulatory Visit: Payer: Self-pay

## 2020-08-03 ENCOUNTER — Ambulatory Visit: Payer: Medicaid Other | Admitting: Anesthesiology

## 2020-08-03 ENCOUNTER — Encounter: Payer: Self-pay | Admitting: Gastroenterology

## 2020-08-03 ENCOUNTER — Ambulatory Visit
Admission: RE | Admit: 2020-08-03 | Discharge: 2020-08-03 | Disposition: A | Discharge status: Home / Self Care | Payer: Medicaid Other | Attending: Gastroenterology | Admitting: Gastroenterology

## 2020-08-03 ENCOUNTER — Encounter
Admission: RE | Disposition: A | Discharge status: Home / Self Care | Payer: Self-pay | Source: Home / Self Care | Attending: Gastroenterology

## 2020-08-03 DIAGNOSIS — Z79899 Other long term (current) drug therapy: Secondary | ICD-10-CM | POA: Diagnosis not present

## 2020-08-03 DIAGNOSIS — K229 Disease of esophagus, unspecified: Secondary | ICD-10-CM | POA: Diagnosis not present

## 2020-08-03 DIAGNOSIS — K2100 Gastro-esophageal reflux disease with esophagitis, without bleeding: Secondary | ICD-10-CM | POA: Insufficient documentation

## 2020-08-03 DIAGNOSIS — K2289 Other specified disease of esophagus: Secondary | ICD-10-CM | POA: Insufficient documentation

## 2020-08-03 DIAGNOSIS — K3189 Other diseases of stomach and duodenum: Secondary | ICD-10-CM | POA: Diagnosis not present

## 2020-08-03 DIAGNOSIS — K2950 Unspecified chronic gastritis without bleeding: Secondary | ICD-10-CM | POA: Insufficient documentation

## 2020-08-03 DIAGNOSIS — Z8616 Personal history of COVID-19: Secondary | ICD-10-CM | POA: Insufficient documentation

## 2020-08-03 DIAGNOSIS — Z09 Encounter for follow-up examination after completed treatment for conditions other than malignant neoplasm: Secondary | ICD-10-CM | POA: Insufficient documentation

## 2020-08-03 DIAGNOSIS — K319 Disease of stomach and duodenum, unspecified: Secondary | ICD-10-CM

## 2020-08-03 DIAGNOSIS — K2980 Duodenitis without bleeding: Secondary | ICD-10-CM | POA: Diagnosis not present

## 2020-08-03 HISTORY — DX: Malignant neoplasm of tongue, unspecified: C02.9

## 2020-08-03 HISTORY — PX: ESOPHAGOGASTRODUODENOSCOPY (EGD) WITH PROPOFOL: SHX5813

## 2020-08-03 LAB — KOH PREP: KOH Prep: NONE SEEN

## 2020-08-03 SURGERY — ESOPHAGOGASTRODUODENOSCOPY (EGD) WITH PROPOFOL
Anesthesia: General

## 2020-08-03 MED ORDER — LIDOCAINE HCL (CARDIAC) PF 100 MG/5ML IV SOSY
PREFILLED_SYRINGE | INTRAVENOUS | Status: DC | PRN
  Administered 2020-08-03: 40 mg via INTRAVENOUS

## 2020-08-03 MED ORDER — PROPOFOL 500 MG/50ML IV EMUL
INTRAVENOUS | Status: DC | PRN
  Administered 2020-08-03: 120 ug/kg/min via INTRAVENOUS

## 2020-08-03 MED ORDER — OMEPRAZOLE 20 MG PO CPDR: 20.0000 mg | DELAYED_RELEASE_CAPSULE | Freq: Every day | ORAL | 0 refills | Status: DC

## 2020-08-03 MED ORDER — LIDOCAINE HCL (PF) 2 % IJ SOLN
INTRAMUSCULAR | Status: AC
  Filled 2020-08-03: qty 5

## 2020-08-03 MED ORDER — PROPOFOL 500 MG/50ML IV EMUL
INTRAVENOUS | Status: AC
  Filled 2020-08-03: qty 50

## 2020-08-03 MED ORDER — SODIUM CHLORIDE 0.9 % IV SOLN: INTRAVENOUS | Status: DC

## 2020-08-03 NOTE — Anesthesia Preprocedure Evaluation (Signed)
Anesthesia Evaluation  Patient identified by MRN, date of birth, ID band Patient awake    Reviewed: Allergy & Precautions, H&P , NPO status , Patient's Chart, lab work & pertinent test results, reviewed documented beta blocker date and time   Airway Mallampati: II   Neck ROM: full    Dental  (+) Poor Dentition   Pulmonary neg pulmonary ROS,    Pulmonary exam normal        Cardiovascular Exercise Tolerance: Good negative cardio ROS Normal cardiovascular exam Rhythm:regular Rate:Normal     Neuro/Psych PSYCHIATRIC DISORDERS Anxiety Depression negative neurological ROS     GI/Hepatic Neg liver ROS, GERD  Medicated,  Endo/Other  negative endocrine ROS  Renal/GU negative Renal ROS  negative genitourinary   Musculoskeletal   Abdominal   Peds  Hematology  (+) Blood dyscrasia, anemia ,   Anesthesia Other Findings Past Medical History: 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[place]) Past Surgical History: 03/30/2020: COLONOSCOPY WITH PROPOFOL; N/A     Comment:  Procedure: COLONOSCOPY WITH PROPOFOL;  Surgeon:               Virgel Manifold, MD;  Location: [facility] ENDOSCOPY;                Service: Endoscopy;  Laterality: N/A;  COVID POSITIVE               02/29/2020 03/30/2020: ESOPHAGOGASTRODUODENOSCOPY (EGD) WITH PROPOFOL; N/A     Comment:  Procedure: ESOPHAGOGASTRODUODENOSCOPY (EGD) WITH               PROPOFOL;  Surgeon: Virgel Manifold, MD;  Location:               [facility] ENDOSCOPY;  Service: Endoscopy;  Laterality: N/A; 01/18/2019: EXCISION MASS NECK; Left     Comment:  Procedure: EXCISION MASS NECK/NODE;  Surgeon: Margaretha Sheffield, MD;  Location: [facility] ORS;  Service: ENT;                Laterality: Left; 02/22/2019: LARYNGOSCOPY; Bilateral     Comment:  Procedure: MICROSCOPIC DIRECT LARYNGOSCOPY AND BIOPSY;                Surgeon: Margaretha Sheffield, MD;  Location: [facility] ORS;                Service: ENT;  Laterality:  Bilateral; 03/24/2019: PORTA CATH INSERTION; N/A     Comment:  Procedure: PORTA CATH INSERTION;  Surgeon: Katha Cabal, MD;  Location: [place] [facility];  Service:              Cardiovascular;  Laterality: N/A; No date: TONSILLECTOMY No date: TUBAL LIGATION BMI    Body Mass Index: 25.61 kg/m     Reproductive/Obstetrics negative OB ROS                             Anesthesia Physical Anesthesia Plan  ASA: 2  Anesthesia Plan: General   Post-op Pain Management:    Induction:   PONV Risk Score and Plan:   Airway Management Planned:   Additional Equipment:   Intra-op Plan:   Post-operative Plan:   Informed Consent: I have reviewed the patients History and Physical, chart, labs and discussed the procedure including the risks, benefits and alternatives for the proposed anesthesia with the  patient or authorized representative who has indicated his/her understanding and acceptance.     Dental Advisory Given  Plan Discussed with: CRNA  Anesthesia Plan Comments:         Anesthesia Quick Evaluation

## 2020-08-03 NOTE — Anesthesia Postprocedure Evaluation (Signed)
Anesthesia Post Note  Patient: Connie Cameron  Procedure(s) Performed: ESOPHAGOGASTRODUODENOSCOPY (EGD) WITH PROPOFOL  Patient location during evaluation: PACU Anesthesia Type: General Level of consciousness: awake and alert Pain management: pain level controlled Vital Signs Assessment: post-procedure vital signs reviewed and stable Respiratory status: spontaneous breathing, nonlabored ventilation, respiratory function stable and patient connected to nasal cannula oxygen Cardiovascular status: blood pressure returned to baseline and stable Postop Assessment: no apparent nausea or vomiting Anesthetic complications: no   No notable events documented.   Last Vitals:  Vitals:   08/03/20 0837 08/03/20 0847  BP: 106/66 122/82  Pulse: 81 74  Resp: 18 10  Temp:    SpO2: 100% 100%    Last Pain:  Vitals:   08/03/20 0847  TempSrc:   PainSc: 0-No pain                 Molli Barrows

## 2020-08-03 NOTE — H&P (Signed)
[name], MD [address] [place], [place], [place], [address] [address], [place], [place], [place], [address] Phone: [phone]  Fax: [phone]  Primary Care Physician:  D[place], Pa   Pre-Procedure History & Physical: HPI:  Connie Cameron is a 60 y.o. female is here for EGD.   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[facility])     Past Surgical History:  Procedure Laterality Date   COLONOSCOPY WITH PROPOFOL N/A 03/30/2020   Procedure: COLONOSCOPY WITH PROPOFOL;  Surgeon: Virgel Manifold, MD;  Location: [facility] END[facility];  Service: Endoscopy;  Laterality: N/A;  COVID POSITIVE 02/29/2020   ESOPHAGOGASTRODUODENOSCOPY (EGD) WITH PROPOFOL N/A 03/30/2020   Procedure: ESOPHAGOGASTRODUODENOSCOPY (EGD) WITH PROPOFOL;  Surgeon: Virgel Manifold, MD;  Location: [facility] END[facility];  Service: Endoscopy;  Laterality: N/A;   EXCISION MASS NECK Left 01/18/2019   Procedure: EXCISION MASS NECK/NODE;  Surgeon: Margaretha Sheffield, MD;  Location: [facility] ORS;  Service: ENT;  Laterality: Left;   LARYNGOSCOPY Bilateral 02/22/2019   Procedure: MICROSCOPIC DIRECT LARYNGOSCOPY AND BIOPSY;  Surgeon: Margaretha Sheffield, MD;  Location: [facility] ORS;  Service: ENT;  Laterality: Bilateral;   PORTA CATH INSERTION N/A 03/24/2019   Procedure: PORTA CATH INSERTION;  Surgeon: Katha Cabal, MD;  Location: [place] [facility];  Service: Cardiovascular;  Laterality: N/A;   TONSILLECTOMY     TUBAL LIGATION      Prior to Admission medications   Medication Sig Start Date End Date Taking? Authorizing Provider  gabapentin (NEURONTIN) 100 MG capsule Take 100 mg by mouth daily as needed. 06/15/20  Yes [provider]  sertraline (ZOLOFT) 25 MG tablet Take 25 mg by mouth daily. 06/15/20  Yes [provider]  hydrOXYzine (ATARAX/VISTARIL) 25 MG tablet Take 25 mg by mouth daily as needed for nausea, vomiting or anxiety. 06/15/20   [provider]   meclizine (ANTIVERT) 12.5 MG tablet Take 1 tablet (12.5 mg total) by mouth 3 (three) times daily as needed for dizziness or nausea. 06/30/20   Dwyane Dee, MD  meclizine (ANTIVERT) 25 MG tablet Take 1-2 tablets (25-50 mg total) by mouth 3 (three) times daily as needed for dizziness. 06/26/20   Ward, Delice Bison, DO  omeprazole (PRILOSEC) 20 MG capsule Take 1 capsule (20 mg total) by mouth daily. 03/30/20 04/29/20  Virgel Manifold, MD  ondansetron (ZOFRAN ODT) 4 MG disintegrating tablet Take 1 tablet (4 mg total) by mouth every 6 (six) hours as needed for nausea or vomiting. 06/26/20   Ward, Delice Bison, DO    Allergies as of 05/05/2020   (No Known Allergies)    Family History  Problem Relation Age of Onset   Breast cancer Sister    Brain cancer Paternal Grandfather     Social History   Socioeconomic History   Marital status: Single    Spouse name: Not on file   Number of children: Not on file   Years of education: Not on file   Highest education level: Not on file  Occupational History   Not on file  Tobacco Use   Smoking status: Never   Smokeless tobacco: Never  Vaping Use   Vaping Use: Never used  Substance and Sexual Activity   Alcohol use: Not Currently   Drug use: No   Sexual activity: Not on file  Other Topics Concern   Not on file  Social History Narrative   Not on file   Social Determinants of  Health   Financial Resource Strain: Not on file  Food Insecurity: Not on file  Transportation Needs: Not on file  Physical Activity: Not on file  Stress: Not on file  Social Connections: Not on file  Intimate Partner Violence: Not on file    Review of Systems: See HPI, otherwise negative ROS  Physical Exam: Constitutional: General:   Alert,  Well-developed, well-nourished, pleasant and cooperative in NAD BP 125/89   Pulse 73   Temp (!) 97 F (36.1 C) (Temporal)   Resp 17   Ht 5\' 2"  (1.575 m)   Wt 63.5 kg   SpO2 100%   BMI 25.61 kg/m   Head:  Normocephalic, atraumatic.   Eyes:  Sclera clear, no icterus.   Conjunctiva pink.   Ears:  No scars, lesions or masses, Normal auditory acuity. Nose:  No deformity, discharge, or lesions. Mouth:  No deformity or lesions, oropharynx pink & moist.  Neck:  Supple; no masses or thyromegaly, trachea midline  Respiratory: Normal respiratory effort  Gastrointestinal:   Soft, non-tender and non-distended without masses, hepatosplenomegaly or hernias noted.  No guarding or rebound tenderness.     Cardiac: No clubbing or edema.  No cyanosis. Normal posterior tibial pedal pulses noted.  Lymphatic:  No significant cervical adenopathy.  Psych:  Alert and cooperative. Normal mood and affect.  Musculoskeletal:   Symmetrical without gross deformities. 5/5 Upper and Lower extremity strength bilaterally.  Skin: Warm. Intact without significant lesions or rashes. No jaundice.  Neurologic:  Face symmetrical, tongue midline, Normal sensation to touch;  grossly normal neurologically.  Psych:  Alert and oriented x3, Alert and cooperative. Normal mood and affect.  Impression/Plan: Connie Cameron is here for a colonoscopy EGD for esophageal lesion and gastritis  Risks, benefits, limitations, and alternatives regarding the procedures have been reviewed with the patient.  Questions have been answered.  All parties agreeable.   Virgel Manifold, MD  08/03/2020, 8:04 AM

## 2020-08-03 NOTE — Transfer of Care (Signed)
Immediate Anesthesia Transfer of Care Note  Patient: Connie Cameron  Procedure(s) Performed: ESOPHAGOGASTRODUODENOSCOPY (EGD) WITH PROPOFOL  Patient Location: PACU  Anesthesia Type:General  Level of Consciousness: awake and sedated  Airway & Oxygen Therapy: Patient Spontanous Breathing and Patient connected to nasal cannula oxygen  Post-op Assessment: Report given to RN and Post -op Vital signs reviewed and stable  Post vital signs: Reviewed and stable  Last Vitals:  Vitals Value Taken Time  BP    Temp    Pulse    Resp    SpO2      Last Pain:  Vitals:   08/03/20 0707  TempSrc: Temporal  PainSc: 0-No pain         Complications: No notable events documented.

## 2020-08-03 NOTE — Op Note (Signed)
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 Providers:             Lennette Bihari. Bonna Gains MD, MD Referring MD:          Forest Gleason Md, MD (Referring MD) Medicines:             Monitored Anesthesia Care Complications:         No immediate complications. Procedure:             Pre-Anesthesia Assessment:                        - Prior to the procedure, a History and Physical was                         performed, and patient medications, allergies and                         sensitivities were reviewed. The patient's tolerance                         of previous anesthesia was reviewed.                        - The risks and benefits of the procedure and the                         sedation options and risks were discussed with the                         patient. All questions were answered and informed                         consent was obtained.                        - Patient identification and proposed procedure were                         verified prior to the procedure by the physician, the                         nurse, the anesthesiologist, the anesthetist and the                         technician. The procedure was verified in the                         procedure room.                        - ASA Grade Assessment:  II - A patient with mild                         systemic disease.                        After obtaining informed consent, the endoscope was                         passed under direct vision. Throughout the procedure,                         the patient's blood pressure, pulse, and  oxygen                         saturations were monitored continuously. The Endoscope                         was introduced through the mouth, and advanced to the                         second part of duodenum. The upper GI endoscopy was                         accomplished with ease. The patient tolerated the                         procedure well. Findings:      A single 4 mm mucosal nodule with a localized distribution was found in       the proximal esophagus, 20 cm from the incisors. This was biopsied with       a cold forceps for complete removal with biopsy forceps due to this       being a squamous papilloma. (As identified on previous endoscopy) This       was completely removed via biopsy forceps.      White nummular lesions were noted in the entire esophagus. Brushings for       KOH prep were obtained.      LA Grade A (one or more mucosal breaks less than 5 mm, not extending       between tops of 2 mucosal folds) esophagitis with no bleeding was found       in the distal esophagus.      The exam of the esophagus was otherwise normal.      Patchy mildly erythematous mucosa without bleeding was found in the       gastric antrum. Biopsies were taken with a cold forceps for histology.       Biopsies were obtained in the gastric body, at the incisura and in the       gastric antrum with cold forceps for Helicobacter pylori testing.      A single 4 mm mucosal papule (nodule) with no stigmata of recent       bleeding was found at the incisura. Biopsies were taken with a cold       forceps for histology.      The exam of the stomach was otherwise normal.      A single 4 mm mucosal nodule was found in the duodenal bulb. Biopsies       were taken with a cold forceps  for histology.      The exam of the duodenum was otherwise normal. Impression:            - Mucosal nodule found in the esophagus. Biopsied.                        - White nummular lesions in esophageal mucosa.                          Brushings performed.                        - LA Grade A reflux esophagitis with no bleeding.                        - Erythematous mucosa in the antrum. Biopsied.                        - A single mucosal papule (nodule) found in the                         stomach. Biopsied.                        - Mucosal nodule found in the duodenum. Biopsied.                        - Biopsies were obtained in the gastric body, at the                         incisura and in the gastric antrum. Recommendation:        - Await pathology results.                        - Take prescribed proton pump inhibitor or H2 blocker                         (antacid) medications 30 - 60 minutes before meals.                        - Discharge patient to home (with escort).                        - Advance diet as tolerated.                        - Continue present medications.                        - Patient has a contact number available for                         emergencies. The signs and symptoms of potential                         delayed complications were discussed with the patient.                         Return to normal activities tomorrow. Written  discharge instructions were provided to the patient.                        - Discharge patient to home (with escort).                        - The findings and recommendations were discussed with                         the patient.                        - The findings and recommendations were discussed with                         the patient's family. Procedure Code(s):     --- Professional ---                        [phone], Esophagogastroduodenoscopy, flexible,                         transoral; with biopsy, single or multiple Diagnosis Code(s):     --- Professional ---                        K22.8, Other specified diseases of esophagus                        K21.00, Gastro-esophageal reflux disease with                          esophagitis, without bleeding                        K31.89, Other diseases of stomach and duodenum                        K29.70, Gastritis, unspecified, without bleeding CPT copyright 2019 American Medical Association. All rights reserved. The codes documented in this report are preliminary and upon coder review may  be revised to meet current compliance requirements.  [name], MD Margretta Sidle B. Bonna Gains MD, MD 08/03/2020 8:51:43 AM This report has been signed electronically. Number of Addenda: 0 Note Initiated On: 08/03/2020 8:05 AM Estimated Blood Loss:  Estimated blood loss: none.      [facility]

## 2020-08-03 NOTE — Anesthesia Procedure Notes (Signed)
Date/Time: 08/03/2020 8:09 AM Performed by: Vaughan Sine Pre-anesthesia Checklist: Patient identified, Emergency Drugs available, Suction available, Patient being monitored and Timeout performed Patient Re-evaluated:Patient Re-evaluated prior to induction Oxygen Delivery Method: Nasal cannula Preoxygenation: Pre-oxygenation with 100% oxygen Induction Type: IV induction Airway Equipment and Method: Bite block Placement Confirmation: positive ETCO2 and CO2 detector

## 2020-08-04 ENCOUNTER — Encounter: Payer: Self-pay | Admitting: Gastroenterology

## 2020-08-04 LAB — SURGICAL PATHOLOGY

## 2020-08-08 ENCOUNTER — Encounter: Payer: Self-pay | Admitting: Gastroenterology

## 2020-08-09 ENCOUNTER — Ambulatory Visit: Payer: Medicaid Other | Admitting: Gastroenterology

## 2020-08-09 ENCOUNTER — Other Ambulatory Visit: Payer: Self-pay

## 2020-08-09 VITALS — BP 145/95 | HR 99 | Temp 98.1°F | Wt 150.6 lb

## 2020-08-09 DIAGNOSIS — D369 Benign neoplasm, unspecified site: Secondary | ICD-10-CM | POA: Diagnosis not present

## 2020-08-09 DIAGNOSIS — R1319 Other dysphagia: Secondary | ICD-10-CM

## 2020-08-09 NOTE — Progress Notes (Signed)
Connie Antigua, MD [address]  [place]  [place], [place] [address]  Main: [phone]  Fax: [phone]   Primary Care Physician: [place], [place]  Chief complaint: Swallowing follow-up   HPI: Connie Cameron is a 60 y.o. female with previous history of dysphagia here for follow-up.  Patient underwent EGD with Schatzki's ring dilation February 2021 dysphagia has resolved completely since then.  Denies any heartburn.  No nausea or vomiting.  No abdominal pain.  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   Tongue cancer [facility])     Past Surgical History:  Procedure Laterality Date   COLONOSCOPY WITH PROPOFOL N/A 03/30/2020   Procedure: COLONOSCOPY WITH PROPOFOL;  Surgeon: Virgel Manifold, MD;  Location: [facility] ENDOSCOPY;  Service: Endoscopy;  Laterality: N/A;  COVID POSITIVE 02/29/2020   ESOPHAGOGASTRODUODENOSCOPY (EGD) WITH PROPOFOL N/A 03/30/2020   Procedure: ESOPHAGOGASTRODUODENOSCOPY (EGD) WITH PROPOFOL;  Surgeon: Virgel Manifold, MD;  Location: [facility] ENDOSCOPY;  Service: Endoscopy;  Laterality: N/A;   ESOPHAGOGASTRODUODENOSCOPY (EGD) WITH PROPOFOL N/A 08/03/2020   Procedure: ESOPHAGOGASTRODUODENOSCOPY (EGD) WITH PROPOFOL;  Surgeon: Virgel Manifold, MD;  Location: [facility] ENDOSCOPY;  Service: Endoscopy;  Laterality: N/A;   EXCISION MASS NECK Left 01/18/2019   Procedure: EXCISION MASS NECK/NODE;  Surgeon: Margaretha Sheffield, MD;  Location: [facility] ORS;  Service: ENT;  Laterality: Left;   LARYNGOSCOPY Bilateral 02/22/2019   Procedure: MICROSCOPIC DIRECT LARYNGOSCOPY AND BIOPSY;  Surgeon: Margaretha Sheffield, MD;  Location: [facility] ORS;  Service: ENT;  Laterality: Bilateral;   PORTA CATH INSERTION N/A 03/24/2019    Procedure: PORTA CATH INSERTION;  Surgeon: Katha Cabal, MD;  Location: [place] CV LAB;  Service: Cardiovascular;  Laterality: N/A;   TONSILLECTOMY     TUBAL LIGATION      Prior to Admission medications   Medication Sig Start Date End Date Taking? Authorizing Provider  gabapentin (NEURONTIN) 100 MG capsule Take 100 mg by mouth daily as needed. 06/15/20  Yes [provider]  hydrOXYzine (ATARAX/VISTARIL) 25 MG tablet Take 25 mg by mouth daily as needed for nausea, vomiting or anxiety. 06/15/20  Yes [provider]  meclizine (ANTIVERT) 12.5 MG tablet Take 1 tablet (12.5 mg total) by mouth 3 (three) times daily as needed for dizziness or nausea. 06/30/20  Yes Dwyane Dee, MD  omeprazole (PRILOSEC) 20 MG capsule Take 1 capsule (20 mg total) by mouth daily. 08/03/20 09/02/20 Yes Virgel Manifold, MD  ondansetron (ZOFRAN ODT) 4 MG disintegrating tablet Take 1 tablet (4 mg total) by mouth every 6 (six) hours as needed for nausea or vomiting. 06/26/20  Yes Ward, Cyril Mourning N, DO  sertraline (ZOLOFT) 25 MG tablet Take 25 mg by mouth daily. 06/15/20  Yes [provider]    Family History  Problem Relation Age of Onset   Breast cancer Sister    Brain cancer Paternal Grandfather      Social History   Tobacco Use   Smoking status: Never   Smokeless tobacco: Never  Vaping Use   Vaping Use: Never used  Substance Use Topics   Alcohol use: Not Currently   Drug use: No    Allergies as of 08/09/2020 - Review Complete 08/03/2020  Allergen Reaction Noted  Chlorhexidine  06/29/2020    Physical Examination:  Constitutional: General:   Alert,  Well-developed, well-nourished, pleasant and cooperative in NAD BP (!) 145/95   Pulse 99   Temp 98.1 F (36.7 C) (Oral)   Wt 150 lb 9.6 oz (68.3 kg)   BMI 27.55 kg/m   Respiratory: Normal respiratory effort  Gastrointestinal:  Soft, non-tender and non-distended without masses, hepatosplenomegaly or hernias  noted.  No guarding or rebound tenderness.     Cardiac: No clubbing or edema.  No cyanosis. Normal posterior tibial pedal pulses noted.  Psych:  Alert and cooperative. Normal mood and affect.  Musculoskeletal:  Normal gait. Head normocephalic, atraumatic. Symmetrical without gross deformities. 5/5 Lower extremity strength bilaterally.  Skin: Warm. Intact without significant lesions or rashes. No jaundice.  Neck: Supple, trachea midline  Lymph: No cervical lymphadenopathy  Psych:  Alert and oriented x3, Alert and cooperative. Normal mood and affect.  Labs: CMP     Component Value Date/Time   NA 142 06/30/2020 0512   NA 140 06/24/2011 1709   K 4.9 06/30/2020 0512   K 4.3 06/24/2011 1709   CL 106 06/30/2020 0512   CL 105 06/24/2011 1709   CO2 29 06/30/2020 0512   CO2 30 06/24/2011 1709   GLUCOSE 89 06/30/2020 0512   GLUCOSE 101 (H) 06/24/2011 1709   BUN 20 06/30/2020 0512   BUN 17 06/24/2011 1709   CREATININE 1.01 (H) 06/30/2020 0512   CREATININE 0.91 06/24/2011 1709   CALCIUM 9.6 06/30/2020 0512   CALCIUM 9.4 06/24/2011 1709   PROT 6.8 05/30/2020 1053   PROT 8.4 (H) 06/24/2011 1709   ALBUMIN 3.8 05/30/2020 1053   ALBUMIN 4.1 06/24/2011 1709   AST 19 05/30/2020 1053   AST 23 06/24/2011 1709   ALT 14 05/30/2020 1053   ALT 18 06/24/2011 1709   ALKPHOS 90 05/30/2020 1053   ALKPHOS 99 06/24/2011 1709   BILITOT 0.4 05/30/2020 1053   BILITOT 0.4 06/24/2011 1709   GFRNONAA >60 06/30/2020 0512   GFRNONAA >60 06/24/2011 1709   GFRAA >60 07/28/2019 1048   GFRAA >60 06/24/2011 1709   Lab Results  Component Value Date   WBC 5.5 06/30/2020   HGB 12.8 06/30/2020   HCT 39.1 06/30/2020   MCV 93.1 06/30/2020   PLT 220 06/30/2020    Imaging Studies:   Assessment and Plan:   Connie Cameron is a 60 y.o. 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   If she develops any frequent heartburn symptoms, patient advised to call us back and she verbalized understanding, as we may need to consider short-term PPI therapy at that point.  Currently she is completing her PPI that was previously prescribed which ends on 09/02/2020  (Risks of PPI use were discussed with patient including bone loss, C. Diff diarrhea, pneumonia, infections, CKD, electrolyte abnormalities.  Pt. Verbalizes understanding and chooses to continue the medication.)  Patient is agreeable to repeat EGD in 1 year for follow-up of the site from where the esophageal squamous papilloma was completely removed to ensure no other abnormalities present at that site.  Recall to be set in chart by clinic staff for the same   Dr Connie Cameron

## 2020-08-17 ENCOUNTER — Ambulatory Visit: Payer: Medicaid Other

## 2020-08-17 ENCOUNTER — Other Ambulatory Visit: Payer: Self-pay

## 2020-08-17 ENCOUNTER — Inpatient Hospital Stay: Payer: Medicaid Other | Attending: Oncology

## 2020-08-17 ENCOUNTER — Inpatient Hospital Stay: Payer: Medicaid Other

## 2020-08-17 ENCOUNTER — Other Ambulatory Visit: Payer: Self-pay | Admitting: Internal Medicine

## 2020-08-17 VITALS — BP 117/80 | HR 77 | Temp 98.6°F | Resp 18

## 2020-08-17 DIAGNOSIS — D709 Neutropenia, unspecified: Secondary | ICD-10-CM | POA: Insufficient documentation

## 2020-08-17 DIAGNOSIS — Z452 Encounter for adjustment and management of vascular access device: Secondary | ICD-10-CM | POA: Diagnosis not present

## 2020-08-17 DIAGNOSIS — Z95828 Presence of other vascular implants and grafts: Secondary | ICD-10-CM

## 2020-08-17 DIAGNOSIS — D701 Agranulocytosis secondary to cancer chemotherapy: Secondary | ICD-10-CM

## 2020-08-17 MED ORDER — HEPARIN SOD (PORK) LOCK FLUSH 100 UNIT/ML IV SOLN
500.0000 [IU] | Freq: Once | INTRAVENOUS | Status: AC
  Administered 2020-08-17: 500 [IU] via INTRAVENOUS
  Filled 2020-08-17: qty 5

## 2020-08-17 MED ORDER — HEPARIN SOD (PORK) LOCK FLUSH 100 UNIT/ML IV SOLN
INTRAVENOUS | Status: AC
  Filled 2020-08-17: qty 5

## 2020-08-17 MED ORDER — CYANOCOBALAMIN 1000 MCG/ML IJ SOLN
1000.0000 ug | Freq: Once | INTRAMUSCULAR | Status: AC
Start: 2020-08-17 — End: 2020-08-17
  Administered 2020-08-17: 1000 ug via INTRAMUSCULAR
  Filled 2020-08-17: qty 1

## 2020-08-17 MED ORDER — SODIUM CHLORIDE 0.9% FLUSH
10.0000 mL | Freq: Once | INTRAVENOUS | Status: AC
  Administered 2020-08-17: 10 mL via INTRAVENOUS
  Filled 2020-08-17: qty 10

## 2020-08-17 NOTE — Progress Notes (Signed)
Pt received a b12 injection in clinic today. Port was flushed as well. No complaints on d/c.

## 2020-08-17 NOTE — Patient Instructions (Signed)
[facility]    Discharge Instructions: Thank you for choosing [facility] to provide your oncology and hematology care.  If you have a lab appointment with the [facility], please go directly to the [facility] and check in at the registration area.  Wear comfortable clothing and clothing appropriate for easy access to any Portacath or PICC line.   We strive to give you quality time with your provider. You may need to reschedule your appointment if you arrive late (15 or more minutes).  Arriving late affects you and other patients whose appointments are after yours.  Also, if you miss three or more appointments without notifying the office, you may be dismissed from the clinic at the provider's discretion.      For prescription refill requests, have your pharmacy contact our office and allow 72 hours for refills to be completed.    BELOW ARE SYMPTOMS THAT SHOULD BE REPORTED IMMEDIATELY: *FEVER GREATER THAN 100.4 F (38 C) OR HIGHER *CHILLS OR SWEATING *NAUSEA AND VOMITING THAT IS NOT CONTROLLED WITH YOUR NAUSEA MEDICATION *UNUSUAL SHORTNESS OF BREATH *UNUSUAL BRUISING OR BLEEDING *URINARY PROBLEMS (pain or burning when urinating, or frequent urination) *BOWEL PROBLEMS (unusual diarrhea, constipation, pain near the anus) TENDERNESS IN MOUTH AND THROAT WITH OR WITHOUT PRESENCE OF ULCERS (sore throat, sores in mouth, or a toothache) UNUSUAL RASH, SWELLING OR PAIN  UNUSUAL VAGINAL DISCHARGE OR ITCHING   Items with * indicate a potential emergency and should be followed up as soon as possible or go to the Emergency Department if any problems should occur.  Please show the CHEMOTHERAPY ALERT CARD or IMMUNOTHERAPY ALERT CARD at check-in to the Emergency Department and triage nurse.  Should you have questions after your visit or need to cancel or reschedule your appointment, please contact [facility]  [phone] and follow  the prompts.  Office hours are 8:00 a.m. to 4:30 p.m. Monday - Friday. Please note that voicemails left after 4:00 p.m. may not be returned until the following business day.  We are closed weekends and major holidays. You have access to a nurse at all times for urgent questions. Please call the main number to the clinic [phone] and follow the prompts.  For any non-urgent questions, you may also contact your provider using MyChart. We now offer e-Visits for anyone 56 and older to request care online for non-urgent symptoms. For details visit [email or web address].   Also download the MyChart app! Go to the app store, search "MyChart", open the app, select [facility], and log in with your MyChart username and password.  Due to Covid, a mask is required upon entering the hospital/clinic. If you do not have a mask, one will be given to you upon arrival. For doctor visits, patients may have 1 support person aged 37 or older with them. For treatment visits, patients cannot have anyone with them due to current Covid guidelines and our immunocompromised population.

## 2020-08-29 ENCOUNTER — Inpatient Hospital Stay: Payer: Medicaid Other

## 2020-09-14 ENCOUNTER — Inpatient Hospital Stay: Payer: Medicaid Other | Attending: Nurse Practitioner

## 2020-09-14 ENCOUNTER — Ambulatory Visit: Payer: Medicaid Other

## 2020-09-14 ENCOUNTER — Other Ambulatory Visit: Payer: Self-pay

## 2020-09-14 DIAGNOSIS — E538 Deficiency of other specified B group vitamins: Secondary | ICD-10-CM | POA: Insufficient documentation

## 2020-09-14 DIAGNOSIS — C01 Malignant neoplasm of base of tongue: Secondary | ICD-10-CM | POA: Insufficient documentation

## 2020-09-14 DIAGNOSIS — Z79899 Other long term (current) drug therapy: Secondary | ICD-10-CM | POA: Diagnosis not present

## 2020-09-14 DIAGNOSIS — Z95828 Presence of other vascular implants and grafts: Secondary | ICD-10-CM

## 2020-09-14 DIAGNOSIS — T451X5A Adverse effect of antineoplastic and immunosuppressive drugs, initial encounter: Secondary | ICD-10-CM

## 2020-09-14 DIAGNOSIS — D701 Agranulocytosis secondary to cancer chemotherapy: Secondary | ICD-10-CM

## 2020-09-14 MED ORDER — SODIUM CHLORIDE 0.9% FLUSH
10.0000 mL | INTRAVENOUS | Status: DC | PRN
  Administered 2020-09-14: 10 mL via INTRAVENOUS
  Filled 2020-09-14: qty 10

## 2020-09-14 MED ORDER — CYANOCOBALAMIN 1000 MCG/ML IJ SOLN
1000.0000 ug | Freq: Once | INTRAMUSCULAR | Status: AC
  Administered 2020-09-14: 1000 ug via INTRAMUSCULAR

## 2020-09-14 MED ORDER — HEPARIN SOD (PORK) LOCK FLUSH 100 UNIT/ML IV SOLN
500.0000 [IU] | Freq: Once | INTRAVENOUS | Status: AC
  Administered 2020-09-14: 500 [IU] via INTRAVENOUS
  Filled 2020-09-14: qty 5

## 2020-09-19 ENCOUNTER — Inpatient Hospital Stay: Payer: Medicaid Other

## 2020-10-02 ENCOUNTER — Other Ambulatory Visit: Payer: Self-pay | Admitting: Gastroenterology

## 2020-10-18 ENCOUNTER — Other Ambulatory Visit: Payer: Self-pay

## 2020-10-18 ENCOUNTER — Inpatient Hospital Stay: Payer: Medicaid Other | Attending: Oncology

## 2020-10-18 DIAGNOSIS — Z79899 Other long term (current) drug therapy: Secondary | ICD-10-CM | POA: Insufficient documentation

## 2020-10-18 DIAGNOSIS — E538 Deficiency of other specified B group vitamins: Secondary | ICD-10-CM | POA: Diagnosis not present

## 2020-10-18 DIAGNOSIS — D701 Agranulocytosis secondary to cancer chemotherapy: Secondary | ICD-10-CM

## 2020-10-18 MED ORDER — CYANOCOBALAMIN 1000 MCG/ML IJ SOLN
1000.0000 ug | Freq: Once | INTRAMUSCULAR | Status: AC
  Administered 2020-10-18: 1000 ug via INTRAMUSCULAR

## 2020-10-20 ENCOUNTER — Other Ambulatory Visit: Payer: Self-pay | Admitting: Otolaryngology

## 2020-10-20 DIAGNOSIS — H9202 Otalgia, left ear: Secondary | ICD-10-CM

## 2020-11-08 ENCOUNTER — Other Ambulatory Visit: Payer: Self-pay

## 2020-11-08 ENCOUNTER — Ambulatory Visit
Admission: RE | Admit: 2020-11-08 | Discharge: 2020-11-08 | Disposition: A | Discharge status: Home / Self Care | Payer: Medicaid Other | Source: Ambulatory Visit | Attending: Otolaryngology | Admitting: Otolaryngology

## 2020-11-08 DIAGNOSIS — H9202 Otalgia, left ear: Secondary | ICD-10-CM | POA: Insufficient documentation

## 2020-11-08 LAB — POCT I-STAT CREATININE: Creatinine, Ser: 1 mg/dL (ref 0.44–1.00)

## 2020-11-08 MED ORDER — IOHEXOL 300 MG/ML  SOLN
75.0000 mL | Freq: Once | INTRAMUSCULAR | Status: AC | PRN
  Administered 2020-11-08: 75 mL via INTRAVENOUS

## 2020-11-16 ENCOUNTER — Ambulatory Visit: Payer: Medicaid Other | Admitting: Radiation Oncology

## 2020-11-16 IMAGING — CT CT NECK W/ CM
5 series · 16 of 33 positions shown, 18 images · IV contrast (omnipaque)
Comparison: CT neck 12/21/2018

CLINICAL DATA: Carcinoma base of tongue. Post lymph node resection
with chemo and radiation.

EXAM:
CT NECK WITH CONTRAST
TECHNIQUE: Multidetector CT imaging of the neck was performed using the
standard protocol following the bolus administration of intravenous
contrast.
CONTRAST:  75mL OMNIPAQUE IOHEXOL 300 MG/ML  SOLN

[Series 2: axial neck neck (person_name) 2.00 · axial · 0.60mm/px · z∈[-778,-696]mm · 2 of 120 slices shown]
[im 40/120  bone]
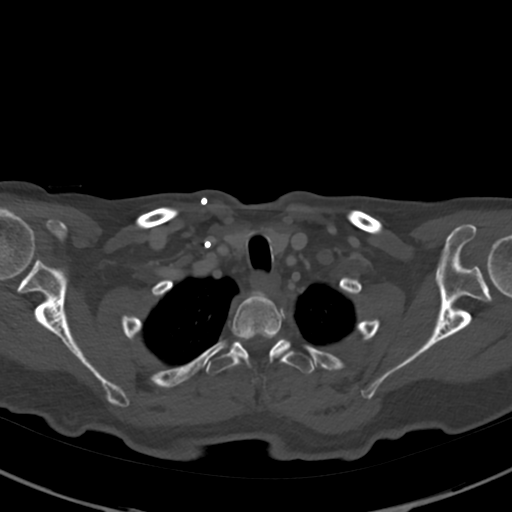
[im 80/120  bone]
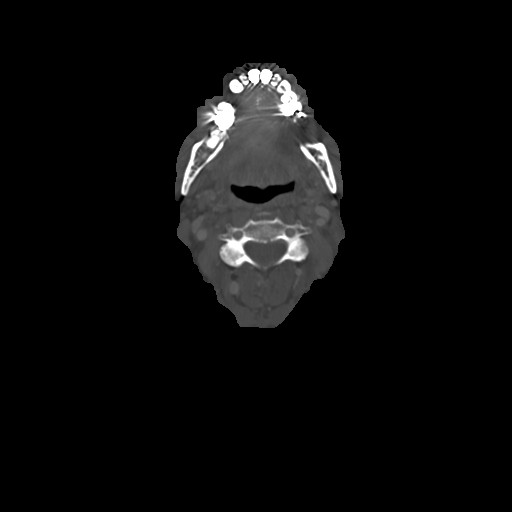

[Series 3: axial bone neck 2.00 · axial · 0.60mm/px · z∈[-796,-676]mm · 3 of 121 slices shown, 4 images]
[im 31/121  soft-tissue]
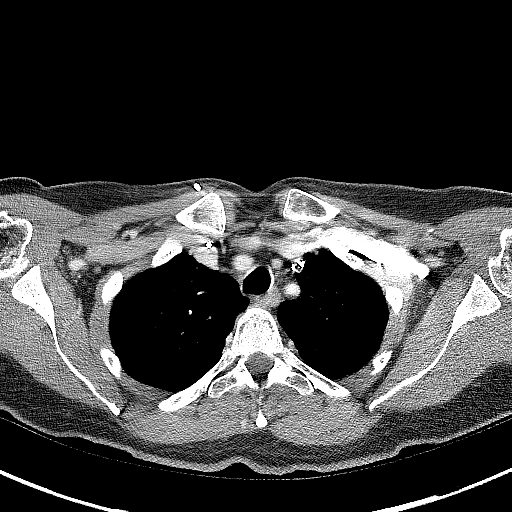
[im 31/121  bone]
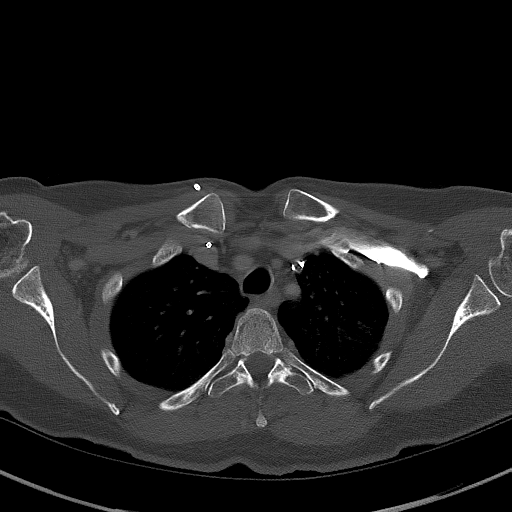
[im 61/121  bone]
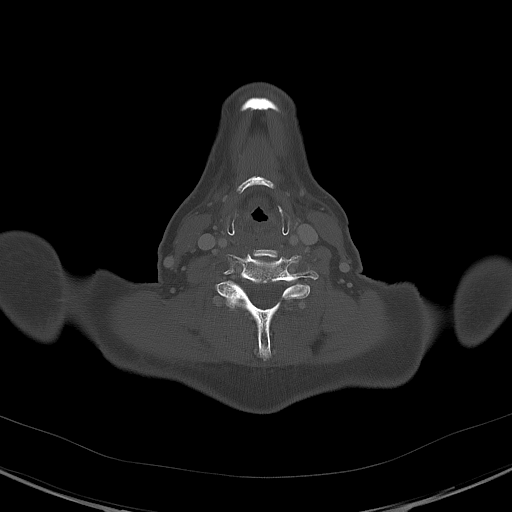
[im 91/121  bone]
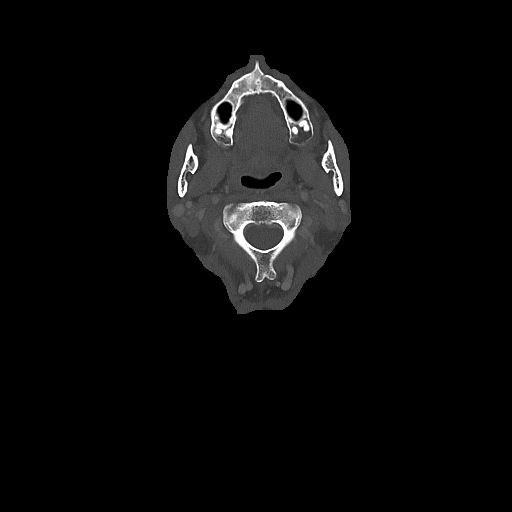

[Series 4: coronal neck neck (person_name) 2.00 cor · coronal · 0.48mm/px · 3 of 141 slices shown]
[im 29/141  bone]
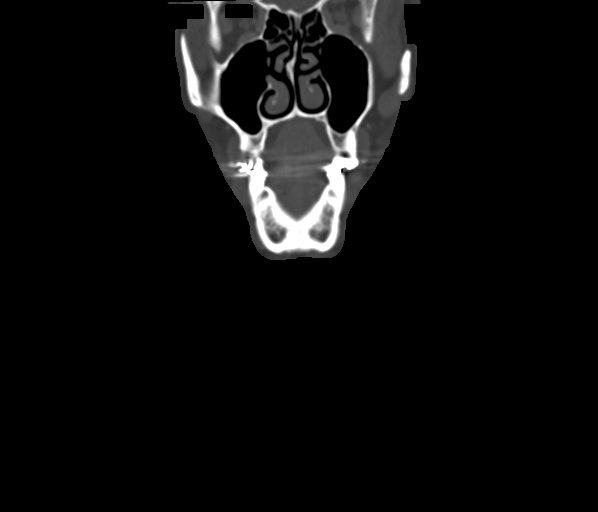
[im 57/141  bone]
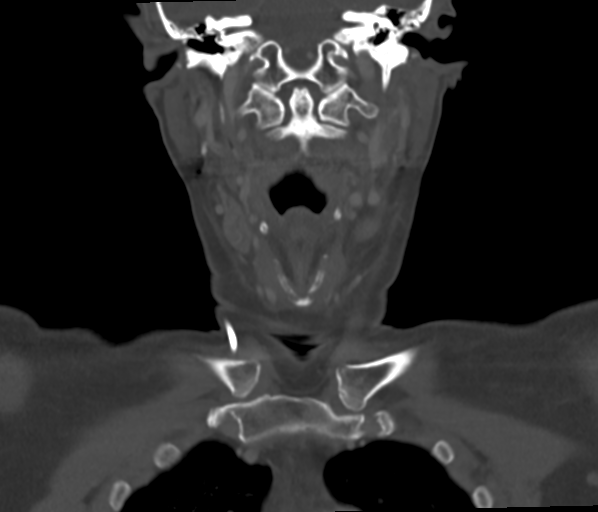
[im 85/141  bone]
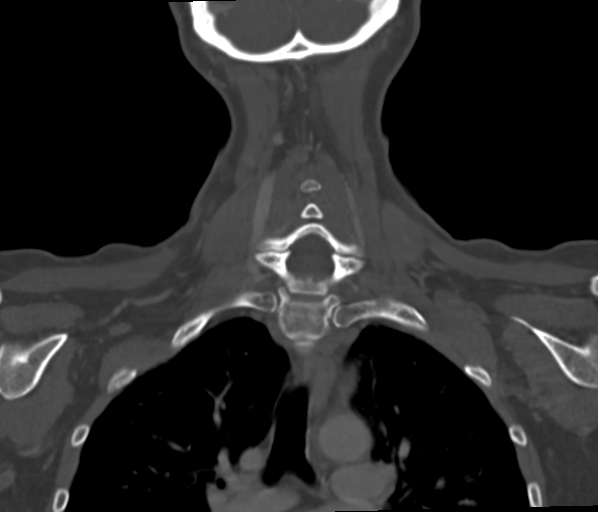

[Series 6: sagittal neck neck (person_name) 2.00 sag · sagittal · 0.48mm/px · 5 of 142 slices shown, 6 images]
[im 48/142  bone]
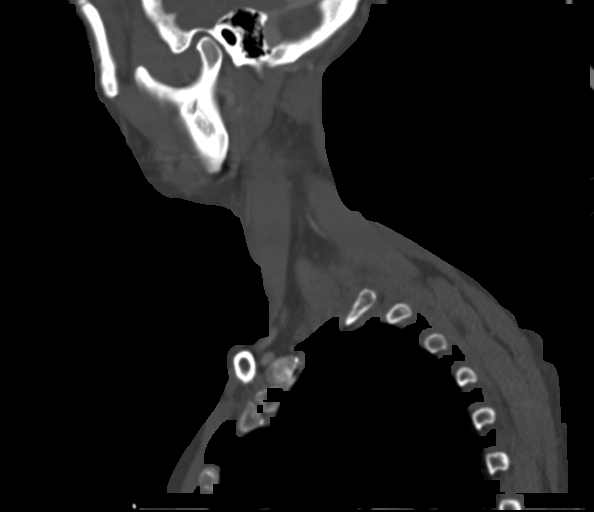
[im 59/142  bone]
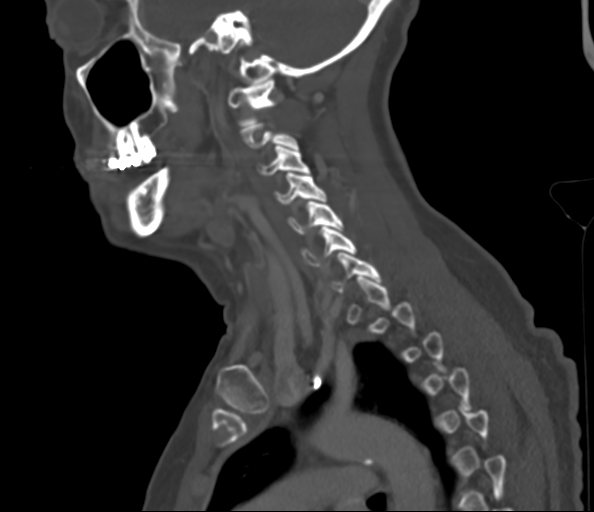
[im 71/142  soft-tissue]
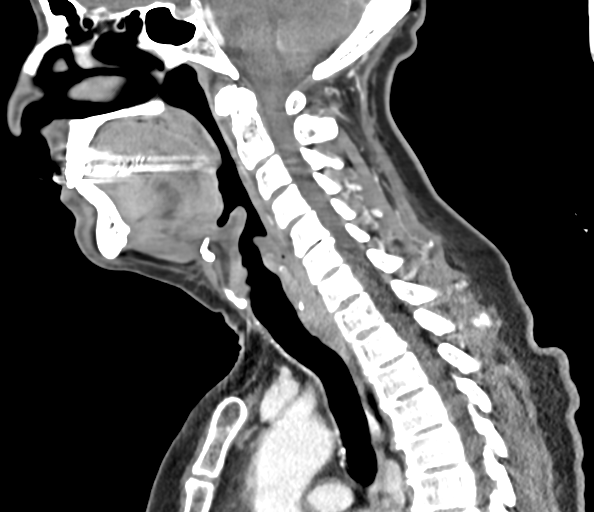
[im 71/142  bone]
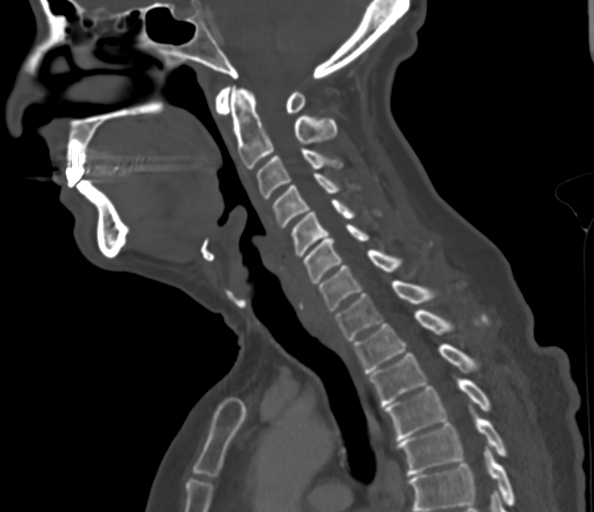
[im 83/142  bone]
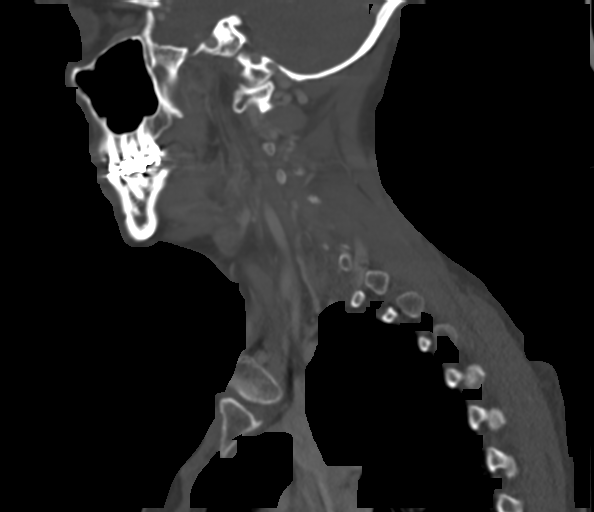
[im 95/142  bone]
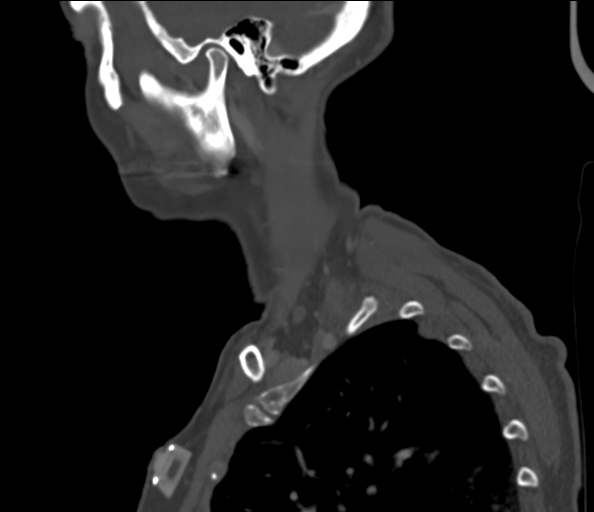

[Series 8: ax oropharynx neck neck (person_name) 2.00 ax · axial · 0.55mm/px · z∈[-793,-673]mm · 3 of 121 slices shown]
[im 31/121  bone]
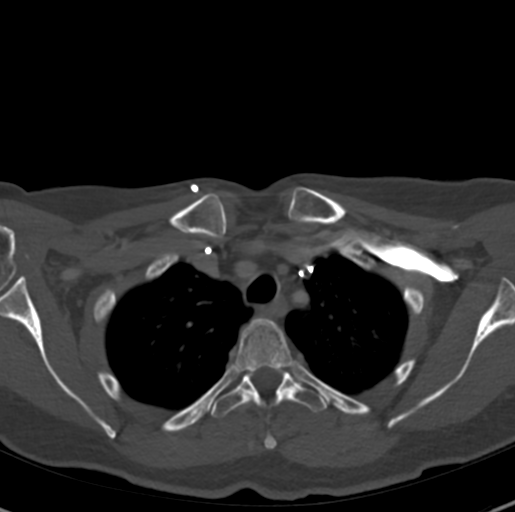
[im 61/121  bone]
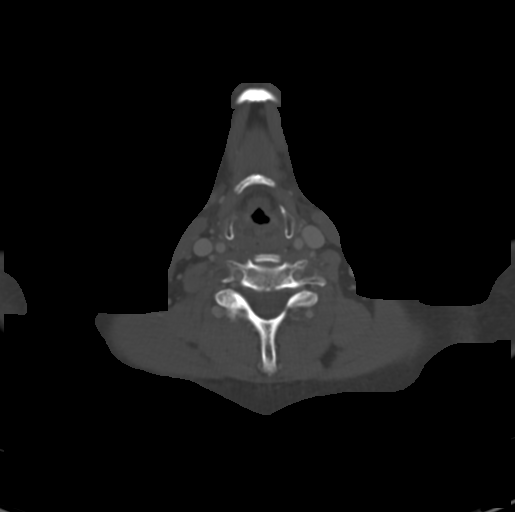
[im 91/121  bone]
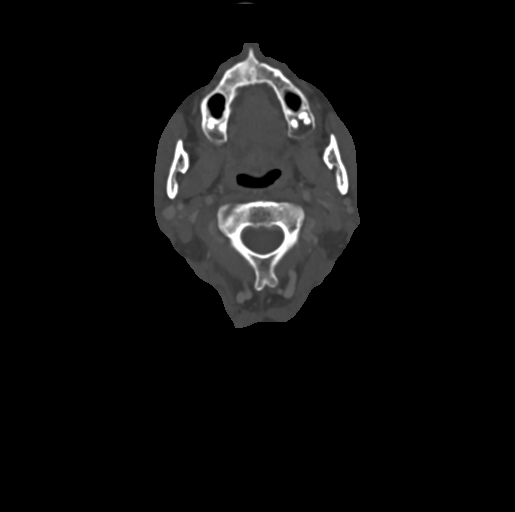

[16 of 33 positions shown; findings below may reference images not displayed]

FINDINGS: Pharynx and larynx: Edema in the epiglottis and larynx due to prior
radiation. No pharyngeal mass. Base of tongue negative.

Post radiation change in the submandibular gland bilaterally which
is now smaller and shows mild hyperenhancement. Negative parotid
bilaterally.

Thyroid: Negative

Lymph nodes: No enlarged lymph nodes. Enlarged left lateral neck is
been removed since the prior CT.

Vascular: Normal vascular enhancement.  Right jugular Port-A-Cath.

Limited intracranial: Negative

Visualized orbits: Negative

Mastoids and visualized paranasal sinuses: Paranasal sinuses clear.
Mastoid clear bilaterally.

Skeleton: Negative

Upper chest: Lung apices clear bilaterally.

Other: Mild stranding in the subcutaneous fat in the anterior neck
due to prior radiation.
IMPRESSION: 1. No evidence of recurrent tumor or adenopathy
2. Post radiation changes in the anterior neck and larynx.

## 2020-11-21 ENCOUNTER — Other Ambulatory Visit: Payer: Self-pay

## 2020-11-21 ENCOUNTER — Inpatient Hospital Stay: Payer: Medicaid Other | Attending: Internal Medicine

## 2020-11-21 ENCOUNTER — Inpatient Hospital Stay: Payer: Medicaid Other

## 2020-11-21 ENCOUNTER — Inpatient Hospital Stay (HOSPITAL_BASED_OUTPATIENT_CLINIC_OR_DEPARTMENT_OTHER): Payer: Medicaid Other | Admitting: Internal Medicine

## 2020-11-21 VITALS — BP 124/82 | HR 67 | Temp 98.0°F | Resp 18 | Wt 166.0 lb

## 2020-11-21 DIAGNOSIS — E538 Deficiency of other specified B group vitamins: Secondary | ICD-10-CM | POA: Diagnosis not present

## 2020-11-21 DIAGNOSIS — D701 Agranulocytosis secondary to cancer chemotherapy: Secondary | ICD-10-CM

## 2020-11-21 DIAGNOSIS — Z95828 Presence of other vascular implants and grafts: Secondary | ICD-10-CM

## 2020-11-21 DIAGNOSIS — T451X5A Adverse effect of antineoplastic and immunosuppressive drugs, initial encounter: Secondary | ICD-10-CM

## 2020-11-21 DIAGNOSIS — Z85810 Personal history of malignant neoplasm of tongue: Secondary | ICD-10-CM | POA: Diagnosis present

## 2020-11-21 DIAGNOSIS — C01 Malignant neoplasm of base of tongue: Secondary | ICD-10-CM

## 2020-11-21 DIAGNOSIS — Z79899 Other long term (current) drug therapy: Secondary | ICD-10-CM | POA: Insufficient documentation

## 2020-11-21 DIAGNOSIS — R5383 Other fatigue: Secondary | ICD-10-CM

## 2020-11-21 LAB — CBC WITH DIFFERENTIAL/PLATELET
Abs Immature Granulocytes: 0.02 10*3/uL (ref 0.00–0.07)
Basophils Absolute: 0 10*3/uL (ref 0.0–0.1)
Basophils Relative: 1 %
Eosinophils Absolute: 0.2 10*3/uL (ref 0.0–0.5)
Eosinophils Relative: 3 %
HCT: 35.2 % — ABNORMAL LOW (ref 36.0–46.0)
Hemoglobin: 11.7 g/dL — ABNORMAL LOW (ref 12.0–15.0)
Immature Granulocytes: 0 %
Lymphocytes Relative: 20 %
Lymphs Abs: 1 10*3/uL (ref 0.7–4.0)
MCH: 30.6 pg (ref 26.0–34.0)
MCHC: 33.2 g/dL (ref 30.0–36.0)
MCV: 92.1 fL (ref 80.0–100.0)
Monocytes Absolute: 0.4 10*3/uL (ref 0.1–1.0)
Monocytes Relative: 8 %
Neutro Abs: 3.3 10*3/uL (ref 1.7–7.7)
Neutrophils Relative %: 68 %
Platelets: 217 10*3/uL (ref 150–400)
RBC: 3.82 MIL/uL — ABNORMAL LOW (ref 3.87–5.11)
RDW: 12.8 % (ref 11.5–15.5)
WBC: 4.9 10*3/uL (ref 4.0–10.5)
nRBC: 0 % (ref 0.0–0.2)

## 2020-11-21 LAB — COMPREHENSIVE METABOLIC PANEL
ALT: 14 U/L (ref 0–44)
AST: 21 U/L (ref 15–41)
Albumin: 4 g/dL (ref 3.5–5.0)
Alkaline Phosphatase: 97 U/L (ref 38–126)
Anion gap: 5 (ref 5–15)
BUN: 18 mg/dL (ref 6–20)
CO2: 26 mmol/L (ref 22–32)
Calcium: 8.9 mg/dL (ref 8.9–10.3)
Chloride: 107 mmol/L (ref 98–111)
Creatinine, Ser: 0.96 mg/dL (ref 0.44–1.00)
GFR, Estimated: >60 mL/min (ref >60)
Glucose, Bld: 112 mg/dL — ABNORMAL HIGH (ref 70–99)
Potassium: 3.6 mmol/L (ref 3.5–5.1)
Sodium: 138 mmol/L (ref 135–145)
Total Bilirubin: 0.2 mg/dL — ABNORMAL LOW (ref 0.3–1.2)
Total Protein: 7 g/dL (ref 6.5–8.1)

## 2020-11-21 LAB — IRON AND TIBC
Iron: 59 ug/dL (ref 28–170)
Saturation Ratios: 21 % (ref 10.4–31.8)
TIBC: 277 ug/dL (ref 250–450)
UIBC: 218 ug/dL

## 2020-11-21 LAB — VITAMIN B12: Vitamin B-12: 197 pg/mL (ref 180–914)

## 2020-11-21 LAB — FERRITIN: Ferritin: 93 ng/mL (ref 11–307)

## 2020-11-21 MED ORDER — HEPARIN SOD (PORK) LOCK FLUSH 100 UNIT/ML IV SOLN
500.0000 [IU] | Freq: Once | INTRAVENOUS | Status: AC
  Filled 2020-11-21: qty 5

## 2020-11-21 MED ORDER — HEPARIN SOD (PORK) LOCK FLUSH 100 UNIT/ML IV SOLN
INTRAVENOUS | Status: AC
  Administered 2020-11-21: 500 [IU]
  Filled 2020-11-21: qty 5

## 2020-11-21 MED ORDER — CYANOCOBALAMIN 1000 MCG/ML IJ SOLN
1000.0000 ug | Freq: Once | INTRAMUSCULAR | Status: AC
  Administered 2020-11-21: 1000 ug via INTRAMUSCULAR
  Filled 2020-11-21: qty 1

## 2020-11-21 MED ORDER — SODIUM CHLORIDE 0.9% FLUSH
10.0000 mL | INTRAVENOUS | Status: DC | PRN
  Administered 2020-11-21: 10 mL via INTRAVENOUS
  Filled 2020-11-21: qty 10

## 2020-11-21 NOTE — Assessment & Plan Note (Addendum)
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 Ladene Artist q 50M.  Clinically no evidence of recurrence.  #Normocytic anemia-hemoglobin slightly low at 11.7; history of B12 deficiency-continue B12 injections monthly x6.  EGD colonoscopy-February 2022 [Dr.Tahiliani]  #Mediport in place-stable flush every 2 to 3 months.  # DISPOSITION: # B12 injection today # B12 injections monthly x6  # 3 months- port flush # follow up in 6 months- MD: labs- cbc/cmp/TSH; B12 levels; B12 injection; port flush- Dr.B

## 2020-11-21 NOTE — Progress Notes (Signed)
Patient reports not sleeping well

## 2020-11-21 NOTE — Progress Notes (Signed)
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  HISTORY OF PRESENTING ILLNESS:  Connie Cameron 60 y.o.  female history of base of tongue squamous cell carcinoma; and also B12 deficiency is here for follow-up.  Denies any difficulty in swallowing.  Denies any new lumps or bumps.  Patient denies any nausea vomiting.  Denies any fevers or chills.  Chronic mild fatigue.  Review of Systems  Constitutional:  Positive for malaise/fatigue. Negative for chills, diaphoresis, fever and weight loss.  HENT:  Negative for nosebleeds and sore throat.   Eyes:  Negative for double vision.  Respiratory:  Negative for cough, hemoptysis, sputum production, shortness of breath and wheezing.   Cardiovascular:  Negative for chest pain, palpitations, orthopnea and leg swelling.  Gastrointestinal:  Negative for abdominal pain, blood in stool, constipation, diarrhea, heartburn, melena, nausea and vomiting.  Genitourinary:  Negative for dysuria, frequency and urgency.  Musculoskeletal:  Positive for back pain and joint pain.  Skin: Negative.  Negative for itching and rash.  Neurological:  Negative for dizziness, tingling, focal weakness, weakness and headaches.  Endo/Heme/Allergies:  Does not bruise/bleed easily.  Psychiatric/Behavioral:  Negative for depression. The patient is not nervous/anxious and does not have insomnia.     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   Tongue cancer ([employer])     SURGICAL HISTORY: Past Surgical History:  Procedure Laterality Date   COLONOSCOPY WITH PROPOFOL N/A 03/30/2020    Procedure: COLONOSCOPY WITH PROPOFOL;  Surgeon: Virgel Manifold, MD;  Location: [facility];  Service: Endoscopy;  Laterality: N/A;  COVID POSITIVE 02/29/2020   ESOPHAGOGASTRODUODENOSCOPY (EGD) WITH PROPOFOL N/A 03/30/2020   Procedure: ESOPHAGOGASTRODUODENOSCOPY (EGD) WITH PROPOFOL;  Surgeon: Virgel Manifold, MD;  Location: [facility];  Service: Endoscopy;  Laterality: N/A;   ESOPHAGOGASTRODUODENOSCOPY (EGD) WITH PROPOFOL N/A 08/03/2020   Procedure: ESOPHAGOGASTRODUODENOSCOPY (EGD) WITH PROPOFOL;  Surgeon: Virgel Manifold, MD;  Location: [facility];  Service: Endoscopy;  Laterality: N/A;   EXCISION MASS NECK Left 01/18/2019   Procedure: EXCISION MASS NECK/NODE;  Surgeon: Margaretha Sheffield, MD;  Location: [facility];  Service: ENT;  Laterality: Left;   LARYNGOSCOPY Bilateral 02/22/2019   Procedure: MICROSCOPIC DIRECT LARYNGOSCOPY AND BIOPSY;  Surgeon: Margaretha Sheffield, MD;  Location: [facility] OR[facility];  Service: ENT;  Laterality: Bilateral;   PORTA CATH INSERTION N/A 03/24/2019   Procedure: PORTA CATH INSERTION;  Surgeon: Katha Cabal, MD;  Location: [facility] CV LAB;  Service: Cardiovascular;  Laterality: N/A;   TONSILLECTOMY     TUBAL LIGATION      SOCIAL HISTORY: Social History   Socioeconomic History   Marital status: Single    Spouse name: Not on file   Number of children: Not on file   Years of education: Not on file   Highest education level: Not on file  Occupational History   Not on file  Tobacco Use   Smoking status: Never   Smokeless tobacco: Never  Vaping Use   Vaping Use: Never used  Substance and Sexual Activity   Alcohol use: Not Currently   Drug use: No   Sexual activity: Not on file  Other Topics Concern   Not on file  Social History Narrative   Not on file   Social Determinants of Health   Financial Resource Strain: Not on file  Food Insecurity: Not on file  Transportation Needs: Not on file  Physical Activity: Not on file  Stress: Not on  file  Social Connections: Not on file  Intimate Partner Violence: Not on file    FAMILY HISTORY: Family History  Problem Relation Age of Onset   Breast cancer Sister    Brain cancer Paternal Grandfather     ALLERGIES:  is allergic to chlorhexidine.  MEDICATIONS:  Current Outpatient Medications  Medication Sig Dispense Refill   clindamycin (CLEOCIN) 300 MG capsule Take by mouth every 6 (six) hours.     gabapentin (NEURONTIN) 300 MG capsule Take 300 mg by mouth 4 (four) times daily.     sertraline (ZOLOFT) 50 MG tablet Take 50 mg by mouth daily.     Vitamin D, Ergocalciferol, (DRISDOL) 1.25 MG (50000 UNIT) CAPS capsule Take 50,000 Units by mouth once a week.     azithromycin (ZITHROMAX Z-PAK) 250 MG tablet Take 2 tablets (500 mg) on  Day 1,  followed by 1 tablet (250 mg) once daily on Days 2 through 5. 6 each 0   benzonatate (TESSALON PERLES) 100 MG capsule Take 1 capsule (100 mg total) by mouth 3 (three) times daily as needed for cough. 30 capsule 0   hydrOXYzine (ATARAX/VISTARIL) 25 MG tablet Take 25 mg by mouth daily as needed for nausea, vomiting or anxiety. (Patient not taking: Reported on 11/21/2020)     meclizine (ANTIVERT) 12.5 MG tablet Take 1 tablet (12.5 mg total) by mouth 3 (three) times daily as needed for dizziness or nausea. (Patient not taking: Reported on 11/21/2020) 30 tablet 0   meclizine (ANTIVERT) 25 MG tablet Take 25-50 mg by mouth 2 (two) times daily as needed. (Patient not taking: Reported on 11/21/2020)     naproxen (NAPROSYN) 500 MG tablet Take 1 tablet (500 mg total) by mouth 2 (two) times daily with a meal. 20 tablet 0   ondansetron (ZOFRAN ODT) 4 MG disintegrating tablet Take 1 tablet (4 mg total) by mouth every 6 (six) hours as needed for nausea or vomiting. (Patient not taking: Reported on 11/21/2020) 20 tablet 0   predniSONE (STERAPRED UNI-PAK 21 TAB) 10 MG (21) TBPK tablet 6 tablets on day 1, then 5 tablets on day 2, then 4 tablets on day 3, then 3 tablets  on day 4, then 2 tablets on day 5, then 1 tablet on day 6. 21 tablet 0   No current facility-administered medications for this visit.      Marland Kitchen  PHYSICAL EXAMINATION:  Vitals:   11/21/20 1013  BP: 124/82  Pulse: 67  Resp: 18  Temp: 98 F (36.7 C)  SpO2: 100%   Filed Weights   11/21/20 1013  Weight: 166 lb (75.3 kg)    Physical Exam Vitals and nursing note reviewed.  HENT:     Head: Normocephalic and atraumatic.     Mouth/Throat:     Pharynx: Oropharynx is clear.  Eyes:     Extraocular Movements: Extraocular movements intact.     Pupils: Pupils are equal, round, and reactive to light.  Cardiovascular:     Rate and Rhythm: Normal rate and regular rhythm.  Pulmonary:     Comments: Decreased breath sounds bilaterally.  Abdominal:     Palpations: Abdomen is soft.  Musculoskeletal:        General: Normal range of motion.  Cervical back: Normal range of motion.  Skin:    General: Skin is warm.  Neurological:     General: No focal deficit present.     Mental Status: She is alert and oriented to person, place, and time.  Psychiatric:        Behavior: Behavior normal.        Judgment: Judgment normal.     LABORATORY DATA:  I have reviewed the data as listed Lab Results  Component Value Date   WBC 4.9 11/21/2020   HGB 11.7 (L) 11/21/2020   HCT 35.2 (L) 11/21/2020   MCV 92.1 11/21/2020   PLT 217 11/21/2020   Recent Labs    02/08/20 1006 05/11/20 1320 05/30/20 1053 06/26/20 0505 06/29/20 0454 06/30/20 0512 11/08/20 0842 11/21/20 0937  NA 138  --  137   < > 141 142  --  138  K 3.7  --  4.2   < > 4.8 4.9  --  3.6  CL 104  --  104   < > 107 106  --  107  CO2 25  --  28   < > 28 29  --  26  GLUCOSE 116*  --  90   < > 123* 89  --  112*  BUN 16  --  20   < > 22* 20  --  18  CREATININE 0.91   < > 0.93   < > 1.12* 1.01* 1.00 0.96  CALCIUM 9.5  --  9.0   < > 9.6 9.6  --  8.9  GFRNONAA >60  --  >60   < > 56* >60  --  >60  PROT 7.1  --  6.8  --   --   --   --   7.0  ALBUMIN 4.3  --  3.8  --   --   --   --  4.0  AST 22  --  19  --   --   --   --  21  ALT 16  --  14  --   --   --   --  14  ALKPHOS 66  --  90  --   --   --   --  97  BILITOT 1.0  --  0.4  --   --   --   --  0.2*   < > = values in this interval not displayed.    RADIOGRAPHIC STUDIES: I have personally reviewed the radiological images as listed and agreed with the findings in the report. DG Chest 2 View  Result Date: 12/25/2020 CLINICAL DATA:  Cough, fever, shortness of breath EXAM: CHEST - 2 VIEW COMPARISON:  Chest radiograph done on 05/27/2019, CT done on 05/11/2020 FINDINGS: Cardiac size is within normal limits. Mediastinum is unremarkable. There are no new infiltrates or signs of pulmonary edema. There is no pleural effusion or pneumothorax. Tip of central venous catheter is seen in the superior vena cava. IMPRESSION: No active cardiopulmonary disease. Electronically Signed   By: Elmer Picker M.D.   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 Ladene Artist q 42M.  Clinically no evidence of recurrence.  #Normocytic anemia-hemoglobin slightly low at 11.7; history of B12 deficiency-continue B12 injections monthly x6.  EGD colonoscopy-February 2022 [Dr.Tahiliani]  #Mediport in place-stable flush  every 2 to 3 months.  # DISPOSITION: # B12 injection today # B12 injections monthly x6  # 3 months- port flush # follow up in 6 months- MD: labs- cbc/cmp/TSH; B12 levels; B12 injection; port flush- Dr.B  All questions were answered. The patient knows to call the clinic with any problems, questions or concerns.    Cammie Sickle, MD 12/26/2020 9:16 AM

## 2020-11-28 ENCOUNTER — Ambulatory Visit: Payer: Medicaid Other | Admitting: Internal Medicine

## 2020-11-28 ENCOUNTER — Ambulatory Visit: Payer: Medicaid Other

## 2020-11-28 ENCOUNTER — Other Ambulatory Visit: Payer: Medicaid Other

## 2020-12-19 ENCOUNTER — Inpatient Hospital Stay: Payer: Medicaid Other | Attending: Radiation Oncology

## 2020-12-25 ENCOUNTER — Encounter: Payer: Self-pay | Admitting: Emergency Medicine

## 2020-12-25 ENCOUNTER — Emergency Department: Payer: Medicaid Other

## 2020-12-25 ENCOUNTER — Other Ambulatory Visit: Payer: Self-pay

## 2020-12-25 ENCOUNTER — Emergency Department
Admission: EM | Admit: 2020-12-25 | Discharge: 2020-12-25 | Disposition: A | Discharge status: Home / Self Care | Payer: Medicaid Other | Attending: Emergency Medicine | Admitting: Emergency Medicine

## 2020-12-25 DIAGNOSIS — Z85828 Personal history of other malignant neoplasm of skin: Secondary | ICD-10-CM | POA: Insufficient documentation

## 2020-12-25 DIAGNOSIS — R0981 Nasal congestion: Secondary | ICD-10-CM | POA: Diagnosis not present

## 2020-12-25 DIAGNOSIS — R059 Cough, unspecified: Secondary | ICD-10-CM | POA: Diagnosis not present

## 2020-12-25 DIAGNOSIS — J209 Acute bronchitis, unspecified: Secondary | ICD-10-CM | POA: Insufficient documentation

## 2020-12-25 DIAGNOSIS — J111 Influenza due to unidentified influenza virus with other respiratory manifestations: Secondary | ICD-10-CM

## 2020-12-25 DIAGNOSIS — Z20822 Contact with and (suspected) exposure to covid-19: Secondary | ICD-10-CM | POA: Diagnosis not present

## 2020-12-25 DIAGNOSIS — M791 Myalgia, unspecified site: Secondary | ICD-10-CM | POA: Diagnosis not present

## 2020-12-25 DIAGNOSIS — R0602 Shortness of breath: Secondary | ICD-10-CM | POA: Diagnosis present

## 2020-12-25 LAB — RESP PANEL BY RT-PCR (FLU A&B, COVID) ARPGX2
Influenza A by PCR: NEGATIVE
Influenza B by PCR: NEGATIVE
SARS Coronavirus 2 by RT PCR: NEGATIVE

## 2020-12-25 MED ORDER — KETOROLAC TROMETHAMINE 30 MG/ML IJ SOLN
15.0000 mg | INTRAMUSCULAR | Status: AC
  Administered 2020-12-25: 15 mg via INTRAVENOUS
  Filled 2020-12-25: qty 1

## 2020-12-25 MED ORDER — AZITHROMYCIN 250 MG PO TABS: ORAL_TABLET | ORAL | 0 refills | Status: DC

## 2020-12-25 MED ORDER — PREDNISONE 20 MG PO TABS
60.0000 mg | ORAL_TABLET | Freq: Once | ORAL | Status: AC
  Administered 2020-12-25: 60 mg via ORAL
  Filled 2020-12-25: qty 3

## 2020-12-25 MED ORDER — BENZONATATE 100 MG PO CAPS: 100.0000 mg | ORAL_CAPSULE | Freq: Three times a day (TID) | ORAL | 0 refills | Status: DC | PRN

## 2020-12-25 MED ORDER — PREDNISONE 10 MG (21) PO TBPK: ORAL_TABLET | ORAL | 0 refills | Status: DC

## 2020-12-25 MED ORDER — NAPROXEN 500 MG PO TABS: 500.0000 mg | ORAL_TABLET | Freq: Two times a day (BID) | ORAL | 0 refills | Status: DC

## 2020-12-25 NOTE — ED Triage Notes (Signed)
Pt reports cough, congestion, fever, and bodyaches for over a week. Pt concerned she has pneumonia and fells SOB

## 2020-12-25 NOTE — ED Provider Notes (Signed)
Emergency Medicine Provider Triage Evaluation Note  Connie Cameron , a 60 y.o. female  was evaluated in triage.  Pt complains of cough and congestion x1 week.  Review of Systems  Positive: Cough, fever, congestion and body aches for 1 week Negative: Vomiting/diarrhea,   Physical Exam  BP (!) 142/86 (BP Location: Left Arm)   Pulse 100   Temp 98.4 F (36.9 C) (Oral)   Resp 20   Ht 5\' 2"  (1.575 m)   Wt 76 kg   SpO2 99%   BMI 30.65 kg/m  Gen:   Appears well and stable Resp:  Normal effort  MSK:   Moves extremities without difficulty  Other:    Medical Decision Making  Medically screening exam initiated at 12:48 PM.  Appropriate orders placed.  ESTALEE MCCANDLISH was informed that the remainder of the evaluation will be completed by another provider, this initial triage assessment does not replace that evaluation, and the importance of remaining in the ED until their evaluation is complete.  Patient is in no acute distress.  Most likely for pneumonia versus COVID/flu   Versie Starks, PA-C 12/25/20 1248    Lavonia Drafts, MD 12/25/20 1306

## 2020-12-25 NOTE — ED Provider Notes (Signed)
[facility] Emergency Department Provider Note  ____________________________________________  Time seen: Approximately 3:59 PM  I have reviewed the triage vital signs and the nursing notes.   HISTORY  Chief Complaint Fever, Generalized Body Aches, Shortness of Breath, and Cough    HPI Connie Cameron is a 60 y.o. female with past history of throat cancer status post resection, on chemotherapy, GERD who comes ED complaining of cough, congestion, fever, body aches, chills, malaise for the past 9 days.  Gradual onset, constant, waxing and waning, no aggravating or alleviating factors.  No vomiting or diarrhea.  No significant shortness of breath.    Past Medical History:  Diagnosis Date   Throat cancer ([place]) 2021   Tongue cancer [facility])      Patient Active Problem List   Diagnosis Date Noted   Esophageal lesion    Gastric erythema    Duodenal nodule    BPPV (benign paroxysmal positional vertigo) 06/29/2020   Vertigo 06/26/2020   GERD (gastroesophageal reflux disease) 06/26/2020   Depression with anxiety 06/26/2020   Weight loss 02/08/2020   Healthcare maintenance 02/08/2020   Xerostomia 02/07/2020   Leukopenia 06/30/2019   Anemia 06/27/2019   Mucositis 05/31/2019   Encounter for antineoplastic chemotherapy 05/18/2019   Chemotherapy-induced neutropenia ([place]) 05/11/2019   Bronchitis 05/03/2019   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/02/2019   Acute pharyngitis 05/02/2019   Cellulitis 04/02/2019   Goals of care, counseling/discussion 02/28/2019   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   Lymphadenopathy, cervical 12/28/2018   Acute leg pain, left 04/22/2014   Atypical chest pain 04/22/2014   Hx of syncope 04/22/2014   SOB (shortness of breath) 04/22/2014     Past Surgical History:  Procedure Laterality Date   COLONOSCOPY WITH PROPOFOL N/A 03/30/2020   Procedure: COLONOSCOPY WITH PROPOFOL;   Surgeon: Virgel Manifold, MD;  Location: [facility] ENDOSCOPY;  Service: Endoscopy;  Laterality: N/A;  COVID POSITIVE 02/29/2020   ESOPHAGOGASTRODUODENOSCOPY (EGD) WITH PROPOFOL N/A 03/30/2020   Procedure: ESOPHAGOGASTRODUODENOSCOPY (EGD) WITH PROPOFOL;  Surgeon: Virgel Manifold, MD;  Location: [facility] ENDOSCOPY;  Service: Endoscopy;  Laterality: N/A;   ESOPHAGOGASTRODUODENOSCOPY (EGD) WITH PROPOFOL N/A 08/03/2020   Procedure: ESOPHAGOGASTRODUODENOSCOPY (EGD) WITH PROPOFOL;  Surgeon: Virgel Manifold, MD;  Location: [facility] ENDOSCOPY;  Service: Endoscopy;  Laterality: N/A;   EXCISION MASS NECK Left 01/18/2019   Procedure: EXCISION MASS NECK/NODE;  Surgeon: Margaretha Sheffield, MD;  Location: [facility] ORS;  Service: ENT;  Laterality: Left;   LARYNGOSCOPY Bilateral 02/22/2019   Procedure: MICROSCOPIC DIRECT LARYNGOSCOPY AND BIOPSY;  Surgeon: Margaretha Sheffield, MD;  Location: [facility] ORS;  Service: ENT;  Laterality: Bilateral;   PORTA CATH INSERTION N/A 03/24/2019   Procedure: PORTA CATH INSERTION;  Surgeon: Katha Cabal, MD;  Location: [place] [facility];  Service: Cardiovascular;  Laterality: N/A;   TONSILLECTOMY     TUBAL LIGATION       Prior to Admission medications   Medication Sig Start Date End Date Taking? Authorizing Provider  azithromycin (ZITHROMAX Z-PAK) 250 MG tablet Take 2 tablets (500 mg) on  Day 1,  followed by 1 tablet (250 mg) once daily on Days 2 through 5. 12/25/20  Yes Carrie Mew, MD  naproxen (NAPROSYN) 500 MG tablet Take 1 tablet (500 mg total) by mouth 2 (two) times daily with a meal. 12/25/20  Yes Carrie Mew, MD  predniSONE (STERAPRED UNI-PAK 21 TAB) 10 MG (21) TBPK tablet 6 tablets on day  1, then 5 tablets on day 2, then 4 tablets on day 3, then 3 tablets on day 4, then 2 tablets on day 5, then 1 tablet on day 6. 12/25/20  Yes Carrie Mew, MD  clindamycin (CLEOCIN) 300 MG capsule Take by mouth every 6 (six) hours. 11/16/20   [provider]  gabapentin  (NEURONTIN) 300 MG capsule Take 300 mg by mouth 4 (four) times daily. 09/27/20   [provider]  hydrOXYzine (ATARAX/VISTARIL) 25 MG tablet Take 25 mg by mouth daily as needed for nausea, vomiting or anxiety. Patient not taking: Reported on 11/21/2020 06/15/20   [provider]  meclizine (ANTIVERT) 12.5 MG tablet Take 1 tablet (12.5 mg total) by mouth 3 (three) times daily as needed for dizziness or nausea. Patient not taking: Reported on 11/21/2020 06/30/20   Dwyane Dee, MD  meclizine (ANTIVERT) 25 MG tablet Take 25-50 mg by mouth 2 (two) times daily as needed. Patient not taking: Reported on 11/21/2020 09/26/20   [provider]  ondansetron (ZOFRAN ODT) 4 MG disintegrating tablet Take 1 tablet (4 mg total) by mouth every 6 (six) hours as needed for nausea or vomiting. Patient not taking: Reported on 11/21/2020 06/26/20   Ward, Delice Bison, DO  sertraline (ZOLOFT) 50 MG tablet Take 50 mg by mouth daily. 07/27/20   [provider]  Vitamin D, Ergocalciferol, (DRISDOL) 1.25 MG (50000 UNIT) CAPS capsule Take 50,000 Units by mouth once a week. 06/16/20   [provider]     Allergies Chlorhexidine   Family History  Problem Relation Age of Onset   Breast cancer Sister    Brain cancer Paternal Grandfather     Social History Social History   Tobacco Use   Smoking status: Never   Smokeless tobacco: Never  Vaping Use   Vaping Use: Never used  Substance Use Topics   Alcohol use: Not Currently   Drug use: No    Review of Systems  Constitutional:   Positive fever and  chills.  ENT:   Positive sore throat.  Positive rhinorrhea. Cardiovascular:   No chest pain or syncope. Respiratory:   No dyspnea positive nonproductive cough. Gastrointestinal:   Negative for abdominal pain, vomiting and diarrhea.  Musculoskeletal:   Negative for focal pain or swelling All other systems reviewed and are negative except as documented above in ROS and  HPI.  ____________________________________________   PHYSICAL EXAM:  VITAL SIGNS: ED Triage Vitals  Enc Vitals Group     BP 12/25/20 1244 (!) 142/86     Pulse Rate 12/25/20 1244 100     Resp 12/25/20 1244 20     Temp 12/25/20 1244 98.4 F (36.9 C)     Temp Source 12/25/20 1244 Oral     SpO2 12/25/20 1244 99 %     Weight 12/25/20 1242 167 lb 8.8 oz (76 kg)     Height 12/25/20 1242 5\' 2"  (1.575 m)     Head Circumference --      Peak Flow --      Pain Score 12/25/20 1242 6     Pain Loc --      Pain Edu? --      Excl. in [place]? --     Vital signs reviewed, nursing assessments reviewed.   Constitutional:   Alert and oriented. Non-toxic appearance. Eyes:   Conjunctivae are normal. EOMI. PERRL. ENT      Head:   Normocephalic and atraumatic.  TMs normal      Nose:  Normal.      Mouth/Throat: Normal, moist mucosa      Neck:   No meningismus. Full ROM. Hematological/Lymphatic/Immunilogical:   No cervical lymphadenopathy. Cardiovascular:   RRR. Symmetric bilateral radial and DP pulses.  No murmurs. Cap refill less than 2 seconds. Respiratory:   Normal respiratory effort without tachypnea/retractions. Breath sounds are clear and equal bilaterally. No wheezes/rales/rhonchi.  Frequent wheezy cough Gastrointestinal:   Soft and nontender. Non distended. There is no CVA tenderness.  No rebound, rigidity, or guarding. Genitourinary:   deferred Musculoskeletal:   Normal range of motion in all extremities. No joint effusions.  No lower extremity tenderness.  No edema. Neurologic:   Normal speech and language.  Motor grossly intact. No acute focal neurologic deficits are appreciated.  Skin:    Skin is warm, dry and intact. No rash noted.  No petechiae, purpura, or bullae.  ____________________________________________    LABS (pertinent positives/negatives) (all labs ordered are listed, but only abnormal results are displayed) Labs Reviewed  RESP PANEL BY RT-PCR (FLU A&B, COVID) [id number]    ____________________________________________   EKG    ____________________________________________    RADIOLOGY  DG Chest 2 View  Result Date: 12/25/2020 CLINICAL DATA:  Cough, fever, shortness of breath EXAM: CHEST - 2 VIEW COMPARISON:  Chest radiograph done on 05/27/2019, CT done on 05/11/2020 FINDINGS: Cardiac size is within normal limits. Mediastinum is unremarkable. There are no new infiltrates or signs of pulmonary edema. There is no pleural effusion or pneumothorax. Tip of central venous catheter is seen in the superior vena cava. IMPRESSION: No active cardiopulmonary disease. Electronically Signed   By: Elmer Picker M.D.   On: 12/25/2020 13:44    ____________________________________________   PROCEDURES Procedures  ____________________________________________    CLINICAL IMPRESSION / ASSESSMENT AND PLAN / ED COURSE  Medications ordered in the ED: Medications  ketorolac (TORADOL) 30 MG/ML injection 15 mg (has no administration in time range)  predniSONE (DELTASONE) tablet 60 mg (has no administration in time range)    Pertinent labs & imaging results that were available during my care of the patient were reviewed by me and considered in my medical decision making (see chart for details).  JULIANNA VANWAGNER was evaluated in Emergency Department on 12/25/2020 for the symptoms described in the history of present illness. She was evaluated in the context of the global COVID-19 pandemic, which necessitated consideration that the patient might be at risk for infection with the SARS-CoV-2 virus that causes COVID-19. Institutional protocols and algorithms that pertain to the evaluation of patients at risk for COVID-19 are in a state of rapid change based on information released by regulatory bodies including the CDC and federal and state organizations. These policies and algorithms were followed during the patient's care in the ED.   Patient presents with influenza-like  illness.  Lungs are clear, chest x-ray unremarkable, COVID and flu swab negative.  Highly likely to be a viral illness.  With her history of chemotherapy, prolonged duration of symptoms, I will prescribe a course of azithromycin as well as NSAIDs and prednisone taper.  Vitals are normal, nontoxic.  Stable for discharge.      ____________________________________________   FINAL CLINICAL IMPRESSION(S) / ED DIAGNOSES    Final diagnoses:  Influenza-like illness  Acute bronchitis, unspecified organism     ED Discharge Orders          Ordered    azithromycin (ZITHROMAX Z-PAK) 250 MG tablet        12/25/20 1558  naproxen (NAPROSYN) 500 MG tablet  2 times daily with meals        12/25/20 1558    predniSONE (STERAPRED UNI-PAK 21 TAB) 10 MG (21) TBPK tablet        12/25/20 1558            Portions of this note were generated with dragon dictation software. Dictation errors may occur despite best attempts at proofreading.    Carrie Mew, MD 12/25/20 [id number]

## 2020-12-26 ENCOUNTER — Encounter: Payer: Self-pay | Admitting: Oncology

## 2020-12-26 ENCOUNTER — Encounter: Payer: Self-pay | Admitting: Hematology and Oncology

## 2021-01-17 ENCOUNTER — Ambulatory Visit (INDEPENDENT_AMBULATORY_CARE_PROVIDER_SITE_OTHER): Payer: Medicaid Other | Admitting: Gastroenterology

## 2021-01-17 VITALS — BP 132/88 | HR 103 | Temp 98.1°F | Wt 162.0 lb

## 2021-01-17 DIAGNOSIS — R1319 Other dysphagia: Secondary | ICD-10-CM | POA: Diagnosis not present

## 2021-01-17 DIAGNOSIS — K219 Gastro-esophageal reflux disease without esophagitis: Secondary | ICD-10-CM

## 2021-01-17 MED ORDER — OMEPRAZOLE 20 MG PO CPDR: 20.0000 mg | DELAYED_RELEASE_CAPSULE | Freq: Every day | ORAL | 1 refills | Status: DC

## 2021-01-17 NOTE — Progress Notes (Signed)
Connie Antigua, MD [address]  [place], [place] [address]  Main: [phone]  Fax: [phone]   Primary Care Physician: [place]   Chief Complaint  Patient presents with   Dysphagia    Pt reports she had improvement in Sx and was able to eat with no problems until 1 month ago-- food is feeling stuck and having difficulty swallowing again    HPI: Connie Cameron is a 60 y.o. female here for follow-up and is reporting recurrent dysphagia as of the last 1 month.  Reports dysphagia to solid foods only.  No weight loss.  No nausea or vomiting.  Has been taking Tums for heartburn.  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   ROS: All ROS reviewed and negative except as per HPI   Past Medical History:  Diagnosis Date   Throat cancer ([place]) 2021   Tongue cancer [facility])     Past Surgical History:  Procedure Laterality Date   COLONOSCOPY WITH PROPOFOL N/A 03/30/2020   Procedure: COLONOSCOPY WITH PROPOFOL;  Surgeon: Virgel Manifold, MD;  Location: [facility];  Service: Endoscopy;  Laterality: N/A;  COVID POSITIVE 02/29/2020   ESOPHAGOGASTRODUODENOSCOPY (EGD) WITH PROPOFOL N/A 03/30/2020   Procedure: ESOPHAGOGASTRODUODENOSCOPY (EGD) WITH PROPOFOL;  Surgeon: Virgel Manifold, MD;  Location: [facility];  Service: Endoscopy;  Laterality: N/A;   ESOPHAGOGASTRODUODENOSCOPY (EGD) WITH PROPOFOL N/A 08/03/2020   Procedure: ESOPHAGOGASTRODUODENOSCOPY (EGD) WITH PROPOFOL;  Surgeon: Virgel Manifold, MD;  Location: [facility];  Service: Endoscopy;  Laterality: N/A;   EXCISION MASS NECK Left 01/18/2019   Procedure: EXCISION MASS NECK/NODE;  Surgeon: Margaretha Sheffield, MD;  Location: [facility] ORS;  Service: ENT;  Laterality: Left;   LARYNGOSCOPY Bilateral 02/22/2019   Procedure: MICROSCOPIC DIRECT LARYNGOSCOPY AND BIOPSY;  Surgeon: Margaretha Sheffield, MD;  Location: [facility] ORS;  Service:  ENT;  Laterality: Bilateral;   PORTA CATH INSERTION N/A 03/24/2019   Procedure: PORTA CATH INSERTION;  Surgeon: Katha Cabal, MD;  Location: [place] [facility];  Service: Cardiovascular;  Laterality: N/A;   TONSILLECTOMY     TUBAL LIGATION      Prior to Admission medications   Medication Sig Start Date End Date Taking? Authorizing Provider  clindamycin (CLEOCIN) 300 MG capsule Take by mouth every 6 (six) hours. 11/16/20  Yes [provider]  gabapentin (NEURONTIN) 300 MG capsule Take 300 mg by mouth 4 (four) times daily. 09/27/20  Yes [provider]  hydrOXYzine (ATARAX/VISTARIL) 25 MG tablet Take 25 mg by mouth daily as needed for nausea, vomiting or anxiety. 06/15/20  Yes [provider]  meclizine (ANTIVERT) 12.5 MG tablet Take 1 tablet (12.5 mg total) by mouth 3 (three) times daily as needed for dizziness or nausea. 06/30/20  Yes Dwyane Dee, MD  meclizine (ANTIVERT) 25 MG tablet Take 25-50 mg by mouth 2 (two) times daily as needed. 09/26/20  Yes [provider]  naproxen (NAPROSYN) 500 MG tablet Take 1 tablet (500 mg total) by mouth 2 (two) times daily with a meal. 12/25/20  Yes Carrie Mew, MD  omeprazole (PRILOSEC) 20 MG capsule Take 1 capsule (20 mg total) by mouth daily. 01/17/21  Yes Virgel Manifold, MD  ondansetron (ZOFRAN ODT) 4 MG disintegrating tablet Take 1 tablet (4 mg total) by mouth every 6 (six) hours as needed for nausea or vomiting. 06/26/20  Yes Ward, Delice Bison, DO  sertraline (ZOLOFT)  50 MG tablet Take 50 mg by mouth daily. 07/27/20  Yes [provider]  Vitamin D, Ergocalciferol, (DRISDOL) 1.25 MG (50000 UNIT) CAPS capsule Take 50,000 Units by mouth once a week. 06/16/20  Yes [provider]    Family History  Problem Relation Age of Onset   Breast cancer Sister    Brain cancer Paternal Grandfather      Social History   Tobacco Use   Smoking status: Never   Smokeless tobacco: Never  Vaping Use    Vaping Use: Never used  Substance Use Topics   Alcohol use: Not Currently   Drug use: No    Allergies as of 01/17/2021 - Review Complete 12/25/2020  Allergen Reaction Noted   Chlorhexidine  06/29/2020    Physical Examination:  Constitutional: General:   Alert,  Well-developed, well-nourished, pleasant and cooperative in NAD BP 132/88    Pulse (!) 103    Temp 98.1 F (36.7 C) (Oral)    Wt 162 lb (73.5 kg)    BMI 29.63 kg/m   Respiratory: Normal respiratory effort  Gastrointestinal:  Soft, non-tender and non-distended without masses, hepatosplenomegaly or hernias noted.  No guarding or rebound tenderness.     Cardiac: No clubbing or edema.  No cyanosis. Normal posterior tibial pedal pulses noted.  Psych:  Alert and cooperative. Normal mood and affect.  Musculoskeletal:  Normal gait. Head normocephalic, atraumatic. Symmetrical without gross deformities. 5/5 Lower extremity strength bilaterally.  Skin: Warm. Intact without significant lesions or rashes. No jaundice.  Neck: Supple, trachea midline  Lymph: No cervical lymphadenopathy  Psych:  Alert and oriented x3, Alert and cooperative. Normal mood and affect.  Labs: CMP     Component Value Date/Time   NA 138 11/21/2020 0937   NA 140 06/24/2011 1709   K 3.6 11/21/2020 0937   K 4.3 06/24/2011 1709   CL 107 11/21/2020 0937   CL 105 06/24/2011 1709   CO2 26 11/21/2020 0937   CO2 30 06/24/2011 1709   GLUCOSE 112 (H) 11/21/2020 0937   GLUCOSE 101 (H) 06/24/2011 1709   BUN 18 11/21/2020 0937   BUN 17 06/24/2011 1709   CREATININE 0.96 11/21/2020 0937   CREATININE 0.91 06/24/2011 1709   CALCIUM 8.9 11/21/2020 0937   CALCIUM 9.4 06/24/2011 1709   PROT 7.0 11/21/2020 0937   PROT 8.4 (H) 06/24/2011 1709   ALBUMIN 4.0 11/21/2020 0937   ALBUMIN 4.1 06/24/2011 1709   AST 21 11/21/2020 0937   AST 23 06/24/2011 1709   ALT 14 11/21/2020 0937   ALT 18 06/24/2011 1709   ALKPHOS 97 11/21/2020 0937   ALKPHOS 99 06/24/2011  1709   BILITOT 0.2 (L) 11/21/2020 0937   BILITOT 0.4 06/24/2011 1709   GFRNONAA >60 11/21/2020 0937   GFRNONAA >60 06/24/2011 1709   GFRAA >60 07/28/2019 1048   GFRAA >60 06/24/2011 1709   Lab Results  Component Value Date   WBC 4.9 11/21/2020   HGB 11.7 (L) 11/21/2020   HCT 35.2 (L) 11/21/2020   MCV 92.1 11/21/2020   PLT 217 11/21/2020    Imaging Studies:   Assessment and Plan:   Connie Cameron is a 60 y.o. y/o female here for follow-up and is reporting dysphagia for the last month  Patient will likely need dilation of Schatzki's ring  However, we did discuss restarting PPI therapy and reevaluating symptoms after 4 to 6 weeks, before proceeding with upper endoscopy to see if symptoms resolve with PPI.  However, patient would like  to proceed with upper endoscopy at this time  Upper endoscopy would allow for dilation of Schatzki's ring and would also allow for evaluation of site worse, supplement was removed completely in June 2022  We may need to continue patient on low-dose PPI versus H2 RA therapy indefinitely given return of dysphagia and heartburn after discontinuing medications.  This was discussed with the patient  Antireflux lifestyle modifications discussed  Patient willing to start PPI therapy at this time as well  (Risks of PPI use were discussed with patient including bone loss, C. Diff diarrhea, pneumonia, infections, CKD, electrolyte abnormalities.  Pt. Verbalizes understanding and chooses to continue the medication.)  I have discussed alternative options, risks & benefits,  which include, but are not limited to, bleeding, infection, perforation,respiratory complication & drug reaction.  The patient agrees with this plan & written consent will be obtained.     Dr Connie Cameron

## 2021-01-23 ENCOUNTER — Other Ambulatory Visit: Payer: Self-pay

## 2021-01-23 ENCOUNTER — Inpatient Hospital Stay: Payer: Medicaid Other | Attending: Radiation Oncology

## 2021-01-23 DIAGNOSIS — E538 Deficiency of other specified B group vitamins: Secondary | ICD-10-CM | POA: Insufficient documentation

## 2021-01-23 DIAGNOSIS — T451X5A Adverse effect of antineoplastic and immunosuppressive drugs, initial encounter: Secondary | ICD-10-CM

## 2021-01-23 MED ORDER — CYANOCOBALAMIN 1000 MCG/ML IJ SOLN
1000.0000 ug | Freq: Once | INTRAMUSCULAR | Status: AC
  Administered 2021-01-23: 10:00:00 1000 ug via INTRAMUSCULAR
  Filled 2021-01-23: qty 1

## 2021-01-25 ENCOUNTER — Encounter
Admission: RE | Disposition: A | Discharge status: Home / Self Care | Payer: Self-pay | Source: Home / Self Care | Attending: Gastroenterology

## 2021-01-25 ENCOUNTER — Ambulatory Visit: Payer: Medicaid Other | Admitting: Anesthesiology

## 2021-01-25 ENCOUNTER — Ambulatory Visit
Admission: RE | Admit: 2021-01-25 | Discharge: 2021-01-25 | Disposition: A | Discharge status: Home / Self Care | Payer: Medicaid Other | Attending: Gastroenterology | Admitting: Gastroenterology

## 2021-01-25 ENCOUNTER — Other Ambulatory Visit: Payer: Self-pay

## 2021-01-25 ENCOUNTER — Encounter: Payer: Self-pay | Admitting: Gastroenterology

## 2021-01-25 DIAGNOSIS — K222 Esophageal obstruction: Secondary | ICD-10-CM | POA: Diagnosis not present

## 2021-01-25 DIAGNOSIS — Z8616 Personal history of COVID-19: Secondary | ICD-10-CM | POA: Insufficient documentation

## 2021-01-25 DIAGNOSIS — F419 Anxiety disorder, unspecified: Secondary | ICD-10-CM | POA: Insufficient documentation

## 2021-01-25 DIAGNOSIS — Z85810 Personal history of malignant neoplasm of tongue: Secondary | ICD-10-CM | POA: Insufficient documentation

## 2021-01-25 DIAGNOSIS — D649 Anemia, unspecified: Secondary | ICD-10-CM | POA: Diagnosis not present

## 2021-01-25 DIAGNOSIS — F32A Depression, unspecified: Secondary | ICD-10-CM | POA: Insufficient documentation

## 2021-01-25 DIAGNOSIS — R1319 Other dysphagia: Secondary | ICD-10-CM | POA: Diagnosis not present

## 2021-01-25 DIAGNOSIS — K259 Gastric ulcer, unspecified as acute or chronic, without hemorrhage or perforation: Secondary | ICD-10-CM | POA: Diagnosis not present

## 2021-01-25 DIAGNOSIS — K219 Gastro-esophageal reflux disease without esophagitis: Secondary | ICD-10-CM | POA: Insufficient documentation

## 2021-01-25 DIAGNOSIS — K3189 Other diseases of stomach and duodenum: Secondary | ICD-10-CM | POA: Diagnosis not present

## 2021-01-25 DIAGNOSIS — K253 Acute gastric ulcer without hemorrhage or perforation: Secondary | ICD-10-CM

## 2021-01-25 DIAGNOSIS — K269 Duodenal ulcer, unspecified as acute or chronic, without hemorrhage or perforation: Secondary | ICD-10-CM | POA: Insufficient documentation

## 2021-01-25 DIAGNOSIS — R1310 Dysphagia, unspecified: Secondary | ICD-10-CM | POA: Diagnosis present

## 2021-01-25 DIAGNOSIS — K449 Diaphragmatic hernia without obstruction or gangrene: Secondary | ICD-10-CM | POA: Insufficient documentation

## 2021-01-25 DIAGNOSIS — K2970 Gastritis, unspecified, without bleeding: Secondary | ICD-10-CM | POA: Diagnosis not present

## 2021-01-25 HISTORY — DX: Gastro-esophageal reflux disease without esophagitis: K21.9

## 2021-01-25 HISTORY — DX: Depression, unspecified: F32.A

## 2021-01-25 HISTORY — DX: Anxiety disorder, unspecified: F41.9

## 2021-01-25 HISTORY — PX: ESOPHAGOGASTRODUODENOSCOPY (EGD) WITH PROPOFOL: SHX5813

## 2021-01-25 SURGERY — ESOPHAGOGASTRODUODENOSCOPY (EGD) WITH PROPOFOL
Anesthesia: General

## 2021-01-25 MED ORDER — LIDOCAINE HCL (CARDIAC) PF 100 MG/5ML IV SOSY
PREFILLED_SYRINGE | INTRAVENOUS | Status: DC | PRN
  Administered 2021-01-25: 50 mg via INTRAVENOUS

## 2021-01-25 MED ORDER — LIDOCAINE HCL (PF) 2 % IJ SOLN
INTRAMUSCULAR | Status: AC
  Filled 2021-01-25: qty 5

## 2021-01-25 MED ORDER — SODIUM CHLORIDE 0.9 % IV SOLN: INTRAVENOUS | Status: DC

## 2021-01-25 MED ORDER — PROPOFOL 500 MG/50ML IV EMUL
INTRAVENOUS | Status: AC
  Filled 2021-01-25: qty 50

## 2021-01-25 MED ORDER — PROPOFOL 500 MG/50ML IV EMUL
INTRAVENOUS | Status: DC | PRN
  Administered 2021-01-25: 150 ug/kg/min via INTRAVENOUS

## 2021-01-25 MED ORDER — OMEPRAZOLE 20 MG PO CPDR: 20.0000 mg | DELAYED_RELEASE_CAPSULE | Freq: Two times a day (BID) | ORAL | 0 refills | Status: DC

## 2021-01-25 NOTE — Anesthesia Preprocedure Evaluation (Signed)
Anesthesia Evaluation  Patient identified by MRN, date of birth, ID band Patient awake    Reviewed: Allergy & Precautions, NPO status , Patient's Chart, lab work & pertinent test results  Airway Mallampati: II  TM Distance: >3 FB Neck ROM: full    Dental  (+) Teeth Intact   Pulmonary neg pulmonary ROS,    Pulmonary exam normal breath sounds clear to auscultation       Cardiovascular Exercise Tolerance: Good negative cardio ROS Normal cardiovascular exam Rhythm:Regular     Neuro/Psych Anxiety Depression negative neurological ROS  negative psych ROS   GI/Hepatic negative GI ROS, Neg liver ROS, GERD  ,  Endo/Other  negative endocrine ROS  Renal/GU negative Renal ROS  negative genitourinary   Musculoskeletal negative musculoskeletal ROS (+)   Abdominal Normal abdominal exam  (+)   Peds negative pediatric ROS (+)  Hematology negative hematology ROS (+) Blood dyscrasia, anemia ,   Anesthesia Other Findings Past Medical History: No date: Anxiety No date: Depression No date: GERD (gastroesophageal reflux disease) 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[place]) No date: Tongue cancer [facility])  Past Surgical History: 03/30/2020: COLONOSCOPY WITH PROPOFOL; N/A     Comment:  Procedure: COLONOSCOPY WITH PROPOFOL;  Surgeon:               Virgel Manifold, MD;  Location: [facility];                Service: Endoscopy;  Laterality: N/A;  COVID POSITIVE               02/29/2020 03/30/2020: ESOPHAGOGASTRODUODENOSCOPY (EGD) WITH PROPOFOL; N/A     Comment:  Procedure: ESOPHAGOGASTRODUODENOSCOPY (EGD) WITH               PROPOFOL;  Surgeon: Virgel Manifold, MD;  Location:               [facility];  Service: Endoscopy;  Laterality: N/A; 08/03/2020: ESOPHAGOGASTRODUODENOSCOPY (EGD) WITH PROPOFOL; N/A     Comment:  Procedure: ESOPHAGOGASTRODUODENOSCOPY (EGD) WITH               PROPOFOL;  Surgeon: Virgel Manifold, MD;  Location:                [facility];  Service: Endoscopy;  Laterality: N/A; 01/18/2019: EXCISION MASS NECK; Left     Comment:  Procedure: EXCISION MASS NECK/NODE;  Surgeon: Margaretha Sheffield, MD;  Location: [facility]S;  Service: ENT;                Laterality: Left; 02/22/2019: LARYNGOSCOPY; Bilateral     Comment:  Procedure: MICROSCOPIC DIRECT LARYNGOSCOPY AND BIOPSY;                Surgeon: Margaretha Sheffield, MD;  Location: [facility];                Service: ENT;  Laterality: Bilateral; 03/24/2019: PORTA CATH INSERTION; N/A     Comment:  Procedure: PORTA CATH INSERTION;  Surgeon: Katha Cabal, MD;  Location: [place] [facility];  Service:              Cardiovascular;  Laterality: N/A; No date: TONSILLECTOMY No date: TUBAL LIGATION  BMI    Body Mass Index: 29.26 kg/m      Reproductive/Obstetrics negative OB ROS  Anesthesia Physical Anesthesia Plan  ASA: 2  Anesthesia Plan: General   Post-op Pain Management:    Induction: Intravenous  PONV Risk Score and Plan: Propofol infusion and TIVA  Airway Management Planned:   Additional Equipment:   Intra-op Plan:   Post-operative Plan:   Informed Consent: I have reviewed the patients History and Physical, chart, labs and discussed the procedure including the risks, benefits and alternatives for the proposed anesthesia with the patient or authorized representative who has indicated his/her understanding and acceptance.     Dental Advisory Given  Plan Discussed with: CRNA and Surgeon  Anesthesia Plan Comments:         Anesthesia Quick Evaluation

## 2021-01-25 NOTE — Anesthesia Procedure Notes (Signed)
Date/Time: 01/25/2021 8:15 AM Performed by: Vaughan Sine Pre-anesthesia Checklist: Patient identified, Emergency Drugs available, Suction available, Patient being monitored and Timeout performed Patient Re-evaluated:Patient Re-evaluated prior to induction Oxygen Delivery Method: Nasal cannula Preoxygenation: Pre-oxygenation with 100% oxygen Induction Type: IV induction Airway Equipment and Method: Bite block Placement Confirmation: positive ETCO2 and CO2 detector

## 2021-01-25 NOTE — Transfer of Care (Signed)
Immediate Anesthesia Transfer of Care Note  Patient: Connie Cameron  Procedure(s) Performed: ESOPHAGOGASTRODUODENOSCOPY (EGD) WITH PROPOFOL  Patient Location: PACU  Anesthesia Type:General  Level of Consciousness: awake and sedated  Airway & Oxygen Therapy: Patient Spontanous Breathing and Patient connected to nasal cannula oxygen  Post-op Assessment: Report given to RN and Post -op Vital signs reviewed and stable  Post vital signs: Reviewed and stable  Last Vitals:  Vitals Value Taken Time  BP    Temp    Pulse    Resp    SpO2      Last Pain:  Vitals:   01/25/21 0709  TempSrc: Temporal  PainSc: 0-No pain         Complications: No notable events documented.

## 2021-01-25 NOTE — H&P (Signed)
[name], MD [address], [place], [place], [place], [address] [address], [place], [place], [place], [address] Phone: [phone]  Fax: [phone]  Primary Care Physician:  [place], [place]   Pre-Procedure History & Physical: HPI:  Connie Cameron is a 60 y.o. female is here for an EGD.   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   Tongue cancer [facility])     Past Surgical History:  Procedure Laterality Date   COLONOSCOPY WITH PROPOFOL N/A 03/30/2020   Procedure: COLONOSCOPY WITH PROPOFOL;  Surgeon: Virgel Manifold, MD;  Location: [facility] ENDOSCOPY;  Service: Endoscopy;  Laterality: N/A;  COVID POSITIVE 02/29/2020   ESOPHAGOGASTRODUODENOSCOPY (EGD) WITH PROPOFOL N/A 03/30/2020   Procedure: ESOPHAGOGASTRODUODENOSCOPY (EGD) WITH PROPOFOL;  Surgeon: Virgel Manifold, MD;  Location: [facility] ENDOSCOPY;  Service: Endoscopy;  Laterality: N/A;   ESOPHAGOGASTRODUODENOSCOPY (EGD) WITH PROPOFOL N/A 08/03/2020   Procedure: ESOPHAGOGASTRODUODENOSCOPY (EGD) WITH PROPOFOL;  Surgeon: Virgel Manifold, MD;  Location: [facility] ENDOSCOPY;  Service: Endoscopy;  Laterality: N/A;   EXCISION MASS NECK Left 01/18/2019   Procedure: EXCISION MASS NECK/NODE;  Surgeon: Margaretha Sheffield, MD;  Location: [facility] ORS;  Service: ENT;  Laterality: Left;   LARYNGOSCOPY Bilateral 02/22/2019   Procedure: MICROSCOPIC DIRECT LARYNGOSCOPY AND BIOPSY;  Surgeon: Margaretha Sheffield, MD;  Location: [facility] ORS;  Service: ENT;  Laterality: Bilateral;   PORTA CATH INSERTION N/A 03/24/2019   Procedure: PORTA CATH INSERTION;  Surgeon: Katha Cabal, MD;  Location: [facility];  Service: Cardiovascular;  Laterality: N/A;   TONSILLECTOMY     TUBAL LIGATION      Prior to Admission medications   Medication Sig Start Date End Date Taking? Authorizing Provider  gabapentin (NEURONTIN) 300 MG capsule Take 300 mg by mouth 4  (four) times daily. 09/27/20  Yes [provider]  hydrOXYzine (ATARAX/VISTARIL) 25 MG tablet Take 25 mg by mouth daily as needed for nausea, vomiting or anxiety. 06/15/20  Yes [provider]  meclizine (ANTIVERT) 12.5 MG tablet Take 1 tablet (12.5 mg total) by mouth 3 (three) times daily as needed for dizziness or nausea. 06/30/20  Yes Dwyane Dee, MD  meclizine (ANTIVERT) 25 MG tablet Take 25-50 mg by mouth 2 (two) times daily as needed. 09/26/20  Yes [provider]  naproxen (NAPROSYN) 500 MG tablet Take 1 tablet (500 mg total) by mouth 2 (two) times daily with a meal. 12/25/20  Yes Carrie Mew, MD  omeprazole (PRILOSEC) 20 MG capsule Take 1 capsule (20 mg total) by mouth daily. 01/17/21  Yes Virgel Manifold, MD  ondansetron (ZOFRAN ODT) 4 MG disintegrating tablet Take 1 tablet (4 mg total) by mouth every 6 (six) hours as needed for nausea or vomiting. 06/26/20  Yes Ward, Cyril Mourning N, DO  sertraline (ZOLOFT) 50 MG tablet Take 50 mg by mouth daily. 07/27/20  Yes [provider]  Vitamin D, Ergocalciferol, (DRISDOL) 1.25 MG (50000 UNIT) CAPS capsule Take 50,000 Units by mouth once a week. 06/16/20  Yes [provider]  clindamycin (CLEOCIN) 300 MG capsule Take by mouth every 6 (six) hours. 11/16/20   [provider]    Allergies as of 01/18/2021 - Review Complete 12/25/2020  Allergen Reaction Noted   Chlorhexidine  06/29/2020    Family History  Problem Relation Age of Onset   Breast cancer Sister    Brain cancer Paternal Grandfather  Social History   Socioeconomic History   Marital status: Single    Spouse name: Not on file   Number of children: Not on file   Years of education: Not on file   Highest education level: Not on file  Occupational History   Not on file  Tobacco Use   Smoking status: Never   Smokeless tobacco: Never  Vaping Use   Vaping Use: Never used  Substance and Sexual Activity   Alcohol use: Not  Currently   Drug use: No   Sexual activity: Not on file  Other Topics Concern   Not on file  Social History Narrative   Not on file   Social Determinants of Health   Financial Resource Strain: Not on file  Food Insecurity: Not on file  Transportation Needs: Not on file  Physical Activity: Not on file  Stress: Not on file  Social Connections: Not on file  Intimate Partner Violence: Not on file    Review of Systems: See HPI, otherwise negative ROS  Constitutional: General:   Alert,  Well-developed, well-nourished, pleasant and cooperative in NAD BP 129/90    Pulse 82    Temp (!) 97.3 F (36.3 C) (Temporal)    Resp 18    Ht 5\' 2"  (1.575 m)    Wt 72.6 kg    SpO2 97%    BMI 29.26 kg/m   Head: Normocephalic, atraumatic.   Eyes:  Sclera clear, no icterus.   Conjunctiva pink.   Mouth:  No deformity or lesions, oropharynx pink & moist.  Neck:  Supple, trachea midline  Respiratory: Normal respiratory effort  Gastrointestinal:  Soft, non-tender and non-distended without masses, hepatosplenomegaly or hernias noted.  No guarding or rebound tenderness.     Cardiac: No clubbing or edema.  No cyanosis. Normal posterior tibial pedal pulses noted.  Lymphatic:  No significant cervical adenopathy.  Psych:  Alert and cooperative. Normal mood and affect.  Musculoskeletal:   Symmetrical without gross deformities. 5/5 Lower extremity strength bilaterally.  Skin: Warm. Intact without significant lesions or rashes. No jaundice.  Neurologic:  Face symmetrical, tongue midline, Normal sensation to touch;  grossly normal neurologically.  Psych:  Alert and oriented x3, Alert and cooperative. Normal mood and affect.  Impression/Plan: Connie Cameron is here for an EGD for dysphagia  Risks, benefits, limitations, and alternatives regarding the procedure have been reviewed with the patient.  Questions have been answered.  All parties agreeable.   Virgel Manifold, MD  01/25/2021, 8:09  AM

## 2021-01-25 NOTE — Op Note (Addendum)
[facility] Gastroenterology Patient Name: Connie Cameron Procedure Date: 01/25/2021 8:08 AM MRN: [id number] Account #: [id number] Date of Birth: 1960/09/27 Admit Type: Outpatient Age: 60 Room: [facility] ENDO ROOM 3 Gender: Female Note Status: Finalized Instrument Name: Upper Endoscope [id number] Procedure:             Upper GI endoscopy Indications:           Dysphagia Providers:             Kenslei Hearty B. Bonna Gains MD, MD Referring MD:          [place] (Referring MD) Medicines:             Monitored Anesthesia Care Complications:         No immediate complications. Procedure:             Pre-Anesthesia Assessment:                        - Prior to the procedure, a History and Physical was                         performed, and patient medications, allergies and                         sensitivities were reviewed. The patient's tolerance                         of previous anesthesia was reviewed.                        - The risks and benefits of the procedure and the                         sedation options and risks were discussed with the                         patient. All questions were answered and informed                         consent was obtained.                        - Patient identification and proposed procedure were                         verified prior to the procedure by the physician, the                         nurse, the anesthesiologist, the anesthetist and the                         technician. The procedure was verified in the                         procedure room.                        - ASA Grade Assessment: II - A patient with mild  systemic disease.                        After obtaining informed consent, the endoscope was                         passed under direct vision. Throughout the procedure,                         the patient's blood pressure, pulse, and oxygen                         saturations  were monitored continuously. The Endoscope                         was introduced through the mouth, and advanced to the                         second part of duodenum. The upper GI endoscopy was                         accomplished with ease. The patient tolerated the                         procedure well. Findings:      A low-grade of narrowing Schatzki ring was found at the gastroesophageal       junction. A TTS dilator was passed through the scope. Dilation with a       15-16.5-18 mm balloon dilator was performed to 18 mm. The dilation site       was examined and showed complete resolution of luminal narrowing. Good       heme effect seen after dilation to 77mm. Sequential dilation was done       from 15 to 58mm with balloon held in place for 1 min each time, and       reinspection of the area each time to look for heme effect. No heme       effect seen after dilation to 15 mm and 16.5 mm. Good heme effect seen       after dilation to 47mm.      The exam of the esophagus was otherwise normal.      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 No further residual papilloma was seen.       The entire esophagus was examined for this including the area around 20       cm which was also normal.      Patchy mildly erythematous mucosa without bleeding was found in the       gastric antrum. Biopsies were taken with a cold forceps for histology.       Biopsies were obtained in the gastric body, at the incisura and in the       gastric antrum with cold forceps for histology.      Few non-bleeding superficial gastric ulcers with a clean ulcer base       (Forrest Class III) were found at the incisura and in the gastric       antrum. The largest lesion was 4 mm in largest dimension. Biopsies were  taken with a cold forceps for histology.      A few 2 to 3 mm erosions with no bleeding and no stigmata of recent       bleeding were  found in the gastric antrum. Biopsies were taken with a       cold forceps for histology.      A small hiatal hernia was present.      The exam of the stomach was otherwise normal.      A single erosion without bleeding was found in the duodenal bulb.      Patchy mildly erythematous mucosa without active bleeding and with no       stigmata of bleeding was found in the duodenal bulb.      The exam of the duodenum was otherwise normal. Impression:            - Low-grade of narrowing Schatzki ring. Dilated.                        - Erythematous mucosa in the antrum. Biopsied.                        - Non-bleeding gastric ulcers with a clean ulcer base                         (Forrest Class III). Biopsied.                        - Erosive gastropathy with no bleeding and no stigmata                         of recent bleeding. Biopsied.                        - Small hiatal hernia.                        - Duodenal erosion without bleeding.                        - Erythematous duodenopathy.                        - Biopsies were obtained in the gastric body, at the                         incisura and in the gastric antrum. Recommendation:        - Await pathology results.                        - Take prescribed proton pump inhibitor or H2 blocker                         (antacid) medications 30 - 60 minutes before meals.                        - Follow an antireflux regimen.                        - Avoid NSAID use (like Ibuprofen, Aleeve, Motrin,  Advil, Meloxicam, Goodie Powder, BC powder etc.)                         except for Aspirin if it is medically recommended by                         PCP or cardiology                        - Discharge patient to home (with escort).                        - Advance diet as tolerated.                        - Continue present medications.                        - Patient has a contact number available for                          emergencies. The signs and symptoms of potential                         delayed complications were discussed with the patient.                         Return to normal activities tomorrow. Written                         discharge instructions were provided to the patient.                        - Discharge patient to home (with escort).                        - The findings and recommendations were discussed with                         the patient.                        - The findings and recommendations were discussed with                         the patient's family. Procedure Code(s):     --- Professional ---                        [phone], Esophagogastroduodenoscopy, flexible,                         transoral; with transendoscopic balloon dilation of                         esophagus (less than 30 mm diameter)                        43239, 59, Esophagogastroduodenoscopy, flexible,                         transoral; with  biopsy, single or multiple Diagnosis Code(s):     --- Professional ---                        K22.2, Esophageal obstruction                        K31.89, Other diseases of stomach and duodenum                        K25.9, Gastric ulcer, unspecified as acute or chronic,                         without hemorrhage or perforation                        K44.9, Diaphragmatic hernia without obstruction or                         gangrene                        K26.9, Duodenal ulcer, unspecified as acute or                         chronic, without hemorrhage or perforation                        R13.10, Dysphagia, unspecified CPT copyright 2019 American Medical Association. All rights reserved. The codes documented in this report are preliminary and upon coder review may  be revised to meet current compliance requirements.  [name], MD Margretta Sidle B. Bonna Gains MD, MD 01/25/2021 8:47:30 AM This report has been signed electronically. Number of Addenda:  0 Note Initiated On: 01/25/2021 8:08 AM Estimated Blood Loss:  Estimated blood loss: none.      [facility]

## 2021-01-25 NOTE — Anesthesia Postprocedure Evaluation (Signed)
Anesthesia Post Note  Patient: Connie Cameron  Procedure(s) Performed: ESOPHAGOGASTRODUODENOSCOPY (EGD) WITH PROPOFOL  Patient location during evaluation: PACU Anesthesia Type: General Level of consciousness: awake and awake and alert Pain management: satisfactory to patient Vital Signs Assessment: post-procedure vital signs reviewed and stable Respiratory status: spontaneous breathing and respiratory function stable Cardiovascular status: stable Anesthetic complications: no   No notable events documented.   Last Vitals:  Vitals:   01/25/21 0834 01/25/21 0844  BP: 112/73 117/80  Pulse: 86 83  Resp: 17 20  Temp: 36.7 C   SpO2: 96% 98%    Last Pain:  Vitals:   01/25/21 0844  TempSrc:   PainSc: 0-No pain                 VAN STAVEREN,Myndi Wamble

## 2021-01-26 ENCOUNTER — Encounter: Payer: Self-pay | Admitting: Gastroenterology

## 2021-01-30 LAB — SURGICAL PATHOLOGY

## 2021-01-31 ENCOUNTER — Encounter: Payer: Self-pay | Admitting: Gastroenterology

## 2021-02-20 ENCOUNTER — Other Ambulatory Visit: Payer: Self-pay

## 2021-02-20 ENCOUNTER — Inpatient Hospital Stay: Payer: Medicaid Other

## 2021-02-20 ENCOUNTER — Inpatient Hospital Stay: Payer: Medicaid Other | Attending: Radiation Oncology

## 2021-02-20 DIAGNOSIS — C01 Malignant neoplasm of base of tongue: Secondary | ICD-10-CM | POA: Insufficient documentation

## 2021-02-20 DIAGNOSIS — T451X5A Adverse effect of antineoplastic and immunosuppressive drugs, initial encounter: Secondary | ICD-10-CM

## 2021-02-20 DIAGNOSIS — D519 Vitamin B12 deficiency anemia, unspecified: Secondary | ICD-10-CM | POA: Insufficient documentation

## 2021-02-20 DIAGNOSIS — D701 Agranulocytosis secondary to cancer chemotherapy: Secondary | ICD-10-CM

## 2021-02-20 DIAGNOSIS — Z95828 Presence of other vascular implants and grafts: Secondary | ICD-10-CM

## 2021-02-20 MED ORDER — CYANOCOBALAMIN 1000 MCG/ML IJ SOLN
1000.0000 ug | Freq: Once | INTRAMUSCULAR | Status: AC
  Administered 2021-02-20: 1000 ug via INTRAMUSCULAR
  Filled 2021-02-20: qty 1

## 2021-02-20 MED ORDER — HEPARIN SOD (PORK) LOCK FLUSH 100 UNIT/ML IV SOLN
500.0000 [IU] | Freq: Once | INTRAVENOUS | Status: AC
  Administered 2021-02-20: 500 [IU] via INTRAVENOUS
  Filled 2021-02-20: qty 5

## 2021-02-20 MED ORDER — SODIUM CHLORIDE 0.9% FLUSH
10.0000 mL | INTRAVENOUS | Status: DC | PRN
  Administered 2021-02-20: 10 mL via INTRAVENOUS
  Filled 2021-02-20: qty 10

## 2021-03-27 ENCOUNTER — Other Ambulatory Visit: Payer: Self-pay

## 2021-03-27 ENCOUNTER — Inpatient Hospital Stay: Payer: Medicaid Other | Attending: Radiation Oncology

## 2021-03-27 DIAGNOSIS — D519 Vitamin B12 deficiency anemia, unspecified: Secondary | ICD-10-CM | POA: Diagnosis not present

## 2021-03-27 DIAGNOSIS — D701 Agranulocytosis secondary to cancer chemotherapy: Secondary | ICD-10-CM

## 2021-03-27 MED ORDER — CYANOCOBALAMIN 1000 MCG/ML IJ SOLN
1000.0000 ug | Freq: Once | INTRAMUSCULAR | Status: AC
  Administered 2021-03-27: 1000 ug via INTRAMUSCULAR
  Filled 2021-03-27: qty 1

## 2021-04-17 ENCOUNTER — Other Ambulatory Visit: Payer: Self-pay

## 2021-04-18 ENCOUNTER — Other Ambulatory Visit: Payer: Self-pay

## 2021-04-18 ENCOUNTER — Encounter: Payer: Self-pay | Admitting: Gastroenterology

## 2021-04-18 ENCOUNTER — Ambulatory Visit: Payer: Medicaid Other | Admitting: Gastroenterology

## 2021-04-18 VITALS — BP 153/96 | HR 98 | Temp 98.3°F | Ht 62.0 in | Wt 172.5 lb

## 2021-04-18 DIAGNOSIS — E538 Deficiency of other specified B group vitamins: Secondary | ICD-10-CM | POA: Diagnosis not present

## 2021-04-18 DIAGNOSIS — R1013 Epigastric pain: Secondary | ICD-10-CM

## 2021-04-18 MED ORDER — OMEPRAZOLE 40 MG PO CPDR: 40.0000 mg | DELAYED_RELEASE_CAPSULE | Freq: Every day | ORAL | 0 refills | Status: DC

## 2021-04-18 NOTE — Progress Notes (Signed)
?  ?Cephas Darby, MD ?[address]  ?[address]  ?[place], [place] [address]  ?Main: [phone]  ?Fax: [phone] ? ? ? ?Gastroenterology Consultation ? ?Referring Provider:     [facility] ?Primary Care Physician:  [place], [place] ?Primary Gastroenterologist:  Dr. Bonna Gains ?Reason for Consultation:     Upper abdominal pain ?      ? HPI:   ?Connie Cameron is a 61 y.o. female referred by Dr. [facility], [facility]  for consultation & management of upper abdominal pain.  Patient reports 1 month history of upper abdominal pain.  She underwent upper endoscopy by Dr. Bonna Gains in 12/22 for dysphagia, found to have mild narrowing of Schatzki's ring which was dilated to 18 mm.  She also had scattered nonbleeding small ulcers in gastric antrum, biopsies did not reveal H. pylori.  Patient has been taking omeprazole 20 mg daily.  She has been gaining weight, gained about 10 pounds since last visit ? ?NSAIDs: None ? ?Antiplts/Anticoagulants/Anti thrombotics: None ? ?GI Procedures:  ?EGD 01/25/2021 ?- Low-grade of narrowing Schatzki ring. Dilated to 18 mm. ?- Erythematous mucosa in the antrum. Biopsied. ?- Non-bleeding gastric ulcers with a clean ulcer base (Forrest Class III). Biopsied. ?- Erosive gastropathy with no bleeding and no stigmata of recent bleeding. Biopsied. ?- Small hiatal hernia. ?- Duodenal erosion without bleeding. ?- Erythematous duodenopathy. ?- Biopsies were obtained in the gastric body, at the incisura and in the gastric antrum. ?DIAGNOSIS:  ?A. STOMACH ULCERS; COLD BIOPSY:  ?- CHRONIC AND FOCALLY ACTIVE GASTRITIS.  ?- NEGATIVE FOR HELICOBACTER PYLORI BY IMMUNOHISTOCHEMISTRY.  ?- NEGATIVE FOR INTESTINAL METAPLASIA, DYSPLASIA, AND MALIGNANCY.  ? ?Upper endoscopy 08/03/2020 ?- Mucosal nodule found in the esophagus. Biopsied. ?- White nummular lesions in esophageal mucosa. Brushings performed. ?- LA Grade A reflux esophagitis with no bleeding. ?- Erythematous mucosa in the antrum.  Biopsied. ?- A single mucosal papule (nodule) found in the stomach. Biopsied. ?- Mucosal nodule found in the duodenum. Biopsied. ?- Biopsies were obtained in the gastric body, at the incisura and in the gastric antrum. ?DIAGNOSIS:  ?A. DUODENUM BULB NODULE; COLD BIOPSY:  ?- NODULAR PEPTIC DUODENITIS.  ?- NEGATIVE FOR FEATURES OF CELIAC DISEASE.  ?- NEGATIVE FOR DYSPLASIA AND MALIGNANCY.  ? ?B. STOMACH, ANTRUM, INCISURA, BODY; COLD BIOPSY:  ?- CHRONIC GASTRITIS.  ?- NEGATIVE FOR ACTIVE INFLAMMATION AND H PYLORI.  ?- NEGATIVE FOR INTESTINAL METAPLASIA, DYSPLASIA, AND MALIGNANCY.  ? ?C. STOMACH NODULE, INCISURA; COLD BIOPSY:  ?- POLYPOID GASTRIC MUCOSA WITH NO SIGNIFICANT PATHOLOGIC ALTERATION.  ?- NEGATIVE FOR ACTIVE INFLAMMATION AND H PYLORI.  ?- NEGATIVE FOR INTESTINAL METAPLASIA, DYSPLASIA, AND MALIGNANCY.  ? ?D. ESOPHAGUS LESION, 20 CM; COLD BIOPSY:  ?- FRAGMENTS OF SQUAMOUS MUCOSA WITH FEATURES POTENTIALLY COMPATIBLE WITH SQUAMOUS PAPILLOMA.  ?- NEGATIVE FOR DYSPLASIA AND MALIGNANCY.  ? ?Colonoscopy 03/30/2020 ?- Diverticulosis in the sigmoid colon. ?- The examination was otherwise normal. ?- The rectum, sigmoid colon, descending colon, transverse colon, ascending colon and cecum are normal. ?- Non-bleeding internal hemorrhoids. ?- No specimens collected. ? ?Upper endoscopy 03/30/2020 ?DIAGNOSIS:  ?A.  STOMACH; COLD BIOPSY:  ?- MODERATE CHRONIC ACTIVE GASTRITIS.  ?- IHC FOR H.PYLORI IS NEGATIVE, SEE COMMENT.  ?- NEGATIVE FOR DYSPLASIA AND MALIGNANCY.  ? ?B ESOPHAGUS; COLD BIOPSY:  ?- FRAGMENTS OF SQUAMOUS MUCOSA WITH PATCHY GLYCOGEN ACANTHOSIS AND FOCAL  ?MILD ACUTE ESOPHAGITIS.  ?- PASDF STAIN IS NEGATIVE FOR YEAST / FUNGAL HYPHAE; STAIN CONTROLS  ?WORKED APPROPRIATELY.  ?- FRAGMENTS OF GASTRIC CARDIAC TYPE MUCOSA WITH  MODERATE CHRONIC ACTIVE  ?GASTRITIS.  ?- NEGATIVE FOR INTRAEPITHELIAL EOSINOPHILS, DYSPLASIA, AND MALIGNANCY.  ? ?C.  ESOPHAGUS, 20 CM; COLD BIOPSY:  ?- BENIGN SQUAMOUS PAPILLOMA.  ?- NEGATIVE FOR  DYSPLASIA AND MALIGNANCY.  ? ?Past Medical History:  ?Diagnosis Date  ? Anxiety   ? Depression   ? GERD (gastroesophageal reflux disease)   ? 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 Tongue cancer ([place])   ? ? ?Past Surgical History:  ?Procedure Laterality Date  ? COLONOSCOPY WITH PROPOFOL N/A 03/30/2020  ? Procedure: COLONOSCOPY WITH PROPOFOL;  Surgeon: Virgel Manifold, MD;  Location: [facility] ENDOSCOPY;  Service: Endoscopy;  Laterality: N/A;  COVID POSITIVE 02/29/2020  ? ESOPHAGOGASTRODUODENOSCOPY (EGD) WITH PROPOFOL N/A 03/30/2020  ? Procedure: ESOPHAGOGASTRODUODENOSCOPY (EGD) WITH PROPOFOL;  Surgeon: Virgel Manifold, MD;  Location: [facility];  Service: Endoscopy;  Laterality: N/A;  ? ESOPHAGOGASTRODUODENOSCOPY (EGD) WITH PROPOFOL N/A 08/03/2020  ? Procedure: ESOPHAGOGASTRODUODENOSCOPY (EGD) WITH PROPOFOL;  Surgeon: Virgel Manifold, MD;  Location: [facility];  Service: Endoscopy;  Laterality: N/A;  ? ESOPHAGOGASTRODUODENOSCOPY (EGD) WITH PROPOFOL N/A 01/25/2021  ? Procedure: ESOPHAGOGASTRODUODENOSCOPY (EGD) WITH PROPOFOL;  Surgeon: Virgel Manifold, MD;  Location: [facility] ENDOSCOPY;  Service: Endoscopy;  Laterality: N/A;  ? EXCISION MASS NECK Left 01/18/2019  ? Procedure: EXCISION MASS NECK/NODE;  Surgeon: Margaretha Sheffield, MD;  Location: [facility] ORS;  Service: ENT;  Laterality: Left;  ? LARYNGOSCOPY Bilateral 02/22/2019  ? Procedure: MICROSCOPIC DIRECT LARYNGOSCOPY AND BIOPSY;  Surgeon: Margaretha Sheffield, MD;  Location: [facility] ORS;  Service: ENT;  Laterality: Bilateral;  ? PORTA CATH INSERTION N/A 03/24/2019  ? Procedure: PORTA CATH INSERTION;  Surgeon: Katha Cabal, MD;  Location: [place] [facility];  Service: Cardiovascular;  Laterality: N/A;  ? TONSILLECTOMY    ? TUBAL LIGATION    ? ? ?Current Outpatient Medications:  ?  gabapentin (NEURONTIN) 300 MG capsule, Take 300 mg by mouth 4 (four) times daily., Disp: , Rfl:  ?  hydrOXYzine (ATARAX/VISTARIL) 25 MG tablet, Take 25 mg by mouth daily as needed for nausea,  vomiting or anxiety., Disp: , Rfl:  ?  meclizine (ANTIVERT) 12.5 MG tablet, Take 1 tablet (12.5 mg total) by mouth 3 (three) times daily as needed for dizziness or nausea., Disp: 30 tablet, Rfl: 0 ?  meclizine (ANTIVERT) 25 MG tablet, Take 25-50 mg by mouth 2 (two) times daily as needed., Disp: , Rfl:  ?  naproxen (NAPROSYN) 500 MG tablet, Take 1 tablet (500 mg total) by mouth 2 (two) times daily with a meal., Disp: 20 tablet, Rfl: 0 ?  omeprazole (PRILOSEC) 40 MG capsule, Take 1 capsule (40 mg total) by mouth daily., Disp: 90 capsule, Rfl: 0 ?  ondansetron (ZOFRAN ODT) 4 MG disintegrating tablet, Take 1 tablet (4 mg total) by mouth every 6 (six) hours as needed for nausea or vomiting., Disp: 20 tablet, Rfl: 0 ?  sertraline (ZOLOFT) 50 MG tablet, Take 50 mg by mouth daily., Disp: , Rfl:  ?  Vitamin D, Ergocalciferol, (DRISDOL) 1.25 MG (50000 UNIT) CAPS capsule, Take 50,000 Units by mouth once a week., Disp: , Rfl:  ?  clindamycin (CLEOCIN) 300 MG capsule, Take by mouth every 6 (six) hours. (Patient not taking: Reported on 04/18/2021), Disp: , Rfl:  ? ? ? ?Family History  ?Problem Relation Age of Onset  ? Breast cancer Sister   ? Brain cancer Paternal Grandfather   ?  ? ?Social History  ? ?Tobacco Use  ? Smoking status: Never  ? Smokeless tobacco: Never  ?Vaping  Use  ? Vaping Use: Never used  ?Substance Use Topics  ? Alcohol use: Not Currently  ? Drug use: No  ? ? ?Allergies as of 04/18/2021 - Review Complete 04/18/2021  ?Allergen Reaction Noted  ? Chlorhexidine  06/29/2020  ? ? ?Review of Systems:    ?All systems reviewed and negative except where noted in HPI. ? ? Physical Exam:  ?BP (!) 153/96 (BP Location: Left Arm, Patient Position: Sitting, Cuff Size: Normal)   Pulse 98   Temp 98.3 ?F (36.8 ?C) (Oral)   Ht '5\' 2"'$  (1.575 m)   Wt 172 lb 8 oz (78.2 kg)   BMI 31.55 kg/m?  ?No LMP recorded. Patient is postmenopausal. ? ?General:   Alert,  Well-developed, well-nourished, pleasant and cooperative in NAD ?Head:   Normocephalic and atraumatic. ?Eyes:  Sclera clear, no icterus.   Conjunctiva pink. ?Ears:  Normal auditory acuity. ?Nose:  No deformity, discharge, or lesions. ?Mouth:  No deformity or lesions,oropharynx pink & moist. ?Ne

## 2021-04-20 LAB — H. PYLORI ANTIBODY, IGG: H. pylori, IgG AbS: 0.66 Index Value (ref 0.00–0.79)

## 2021-04-20 LAB — VITAMIN B12: Vitamin B-12: 303 pg/mL (ref 232–1245)

## 2021-04-24 ENCOUNTER — Inpatient Hospital Stay: Payer: Medicaid Other | Attending: Radiation Oncology

## 2021-04-24 ENCOUNTER — Other Ambulatory Visit: Payer: Self-pay

## 2021-04-24 DIAGNOSIS — E538 Deficiency of other specified B group vitamins: Secondary | ICD-10-CM | POA: Diagnosis present

## 2021-04-24 DIAGNOSIS — Z79899 Other long term (current) drug therapy: Secondary | ICD-10-CM | POA: Diagnosis not present

## 2021-04-24 DIAGNOSIS — D701 Agranulocytosis secondary to cancer chemotherapy: Secondary | ICD-10-CM

## 2021-04-24 MED ORDER — CYANOCOBALAMIN 1000 MCG/ML IJ SOLN
1000.0000 ug | Freq: Once | INTRAMUSCULAR | Status: AC
  Administered 2021-04-24: 1000 ug via INTRAMUSCULAR
  Filled 2021-04-24: qty 1

## 2021-05-22 ENCOUNTER — Inpatient Hospital Stay (HOSPITAL_BASED_OUTPATIENT_CLINIC_OR_DEPARTMENT_OTHER): Payer: Medicaid Other | Admitting: Internal Medicine

## 2021-05-22 ENCOUNTER — Inpatient Hospital Stay: Payer: Medicaid Other | Attending: Radiation Oncology

## 2021-05-22 ENCOUNTER — Encounter: Payer: Self-pay | Admitting: Internal Medicine

## 2021-05-22 ENCOUNTER — Inpatient Hospital Stay: Payer: Medicaid Other

## 2021-05-22 VITALS — BP 124/87 | HR 71 | Temp 97.7°F | Ht 62.0 in | Wt 175.2 lb

## 2021-05-22 DIAGNOSIS — Z85810 Personal history of malignant neoplasm of tongue: Secondary | ICD-10-CM | POA: Diagnosis present

## 2021-05-22 DIAGNOSIS — R6884 Jaw pain: Secondary | ICD-10-CM | POA: Diagnosis not present

## 2021-05-22 DIAGNOSIS — T451X5A Adverse effect of antineoplastic and immunosuppressive drugs, initial encounter: Secondary | ICD-10-CM

## 2021-05-22 DIAGNOSIS — C01 Malignant neoplasm of base of tongue: Secondary | ICD-10-CM

## 2021-05-22 DIAGNOSIS — E538 Deficiency of other specified B group vitamins: Secondary | ICD-10-CM | POA: Insufficient documentation

## 2021-05-22 DIAGNOSIS — D649 Anemia, unspecified: Secondary | ICD-10-CM | POA: Insufficient documentation

## 2021-05-22 DIAGNOSIS — Z79899 Other long term (current) drug therapy: Secondary | ICD-10-CM | POA: Insufficient documentation

## 2021-05-22 DIAGNOSIS — D519 Vitamin B12 deficiency anemia, unspecified: Secondary | ICD-10-CM | POA: Insufficient documentation

## 2021-05-22 LAB — COMPREHENSIVE METABOLIC PANEL
ALT: 14 U/L (ref 0–44)
AST: 21 U/L (ref 15–41)
Albumin: 3.8 g/dL (ref 3.5–5.0)
Alkaline Phosphatase: 69 U/L (ref 38–126)
Anion gap: 8 (ref 5–15)
BUN: 15 mg/dL (ref 8–23)
CO2: 23 mmol/L (ref 22–32)
Calcium: 9 mg/dL (ref 8.9–10.3)
Chloride: 106 mmol/L (ref 98–111)
Creatinine, Ser: 1.04 mg/dL — ABNORMAL HIGH (ref 0.44–1.00)
GFR, Estimated: >60 mL/min (ref >60)
Glucose, Bld: 157 mg/dL — ABNORMAL HIGH (ref 70–99)
Potassium: 3.7 mmol/L (ref 3.5–5.1)
Sodium: 137 mmol/L (ref 135–145)
Total Bilirubin: 0.6 mg/dL (ref 0.3–1.2)
Total Protein: 7 g/dL (ref 6.5–8.1)

## 2021-05-22 LAB — CBC WITH DIFFERENTIAL/PLATELET
Abs Immature Granulocytes: 0.03 10*3/uL (ref 0.00–0.07)
Basophils Absolute: 0 10*3/uL (ref 0.0–0.1)
Basophils Relative: 1 %
Eosinophils Absolute: 0.2 10*3/uL (ref 0.0–0.5)
Eosinophils Relative: 3 %
HCT: 37.5 % (ref 36.0–46.0)
Hemoglobin: 12.3 g/dL (ref 12.0–15.0)
Immature Granulocytes: 1 %
Lymphocytes Relative: 17 %
Lymphs Abs: 1 10*3/uL (ref 0.7–4.0)
MCH: 29.3 pg (ref 26.0–34.0)
MCHC: 32.8 g/dL (ref 30.0–36.0)
MCV: 89.3 fL (ref 80.0–100.0)
Monocytes Absolute: 0.5 10*3/uL (ref 0.1–1.0)
Monocytes Relative: 8 %
Neutro Abs: 4.1 10*3/uL (ref 1.7–7.7)
Neutrophils Relative %: 70 %
Platelets: 214 10*3/uL (ref 150–400)
RBC: 4.2 MIL/uL (ref 3.87–5.11)
RDW: 13.3 % (ref 11.5–15.5)
WBC: 5.7 10*3/uL (ref 4.0–10.5)
nRBC: 0 % (ref 0.0–0.2)

## 2021-05-22 LAB — TSH: TSH: 4.938 u[IU]/mL — ABNORMAL HIGH (ref 0.350–4.500)

## 2021-05-22 LAB — VITAMIN B12: Vitamin B-12: 265 pg/mL (ref 180–914)

## 2021-05-22 MED ORDER — CYANOCOBALAMIN 1000 MCG/ML IJ SOLN
1000.0000 ug | Freq: Once | INTRAMUSCULAR | Status: AC
  Administered 2021-05-22: 1000 ug via INTRAMUSCULAR
  Filled 2021-05-22: qty 1

## 2021-05-22 MED ORDER — SODIUM CHLORIDE 0.9% FLUSH
10.0000 mL | Freq: Once | INTRAVENOUS | Status: AC
  Administered 2021-05-22: 10 mL via INTRAVENOUS
  Filled 2021-05-22: qty 10

## 2021-05-22 MED ORDER — HEPARIN SOD (PORK) LOCK FLUSH 100 UNIT/ML IV SOLN
500.0000 [IU] | Freq: Once | INTRAVENOUS | Status: AC
  Administered 2021-05-22: 500 [IU] via INTRAVENOUS
  Filled 2021-05-22: qty 5

## 2021-05-22 NOTE — Assessment & Plan Note (Signed)
# ???  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 Ladene Artist q 32M.  Clinically no evidence of recurrence; see discussion below. ? ?# Left jaw pain- [3 months] s/p Eval with Dr.Juengle. ? TMJ; CT scan asap.   ? ?#Normocytic anemia-with hemoglobin normal at 12.3-  [ EGD colonoscopy-February 2022 [Dr.Tahiliani]- STABLE  ? ?#  history of B12 deficiency-continue B12 injections monthly x6. ? ?#Mediport in place-stable flush every 2 to 3 months. ? ?# DISPOSITION: We will call with results. ?# CT in 1 week ?# B12 injection today ?# B12 injections monthly x6  ?# 3 months- port flush ?# follow up in 6 months- MD: labs- cbc/cmp/TSH; B12 levels; B12 injection; port flush- Dr.B ?

## 2021-05-22 NOTE — Addendum Note (Signed)
Addended by: Vanice Sarah on: 05/22/2021 10:08 AM ? ? Modules accepted: Orders ? ?

## 2021-05-22 NOTE — Progress Notes (Signed)
C/o left jaw pain. ?

## 2021-05-22 NOTE — Progress Notes (Signed)
?[facility] ?CONSULT NOTE ? ?Patient Care Team: ?[facility] as PCP - General ?Margaretha Sheffield, MD (Otolaryngology) ?[name], MD as Referring Physician (Radiation Oncology) ? ?CHIEF COMPLAINTS/PURPOSE OF CONSULTATION: Base of tongue cancer squamous cell ? 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 ?#Anemia B12 deficiency B12 injections monthly. ? ?HISTORY OF PRESENTING ILLNESS:  ?Connie Cameron 61 y.o.  female history of base of tongue squamous cell carcinoma; and also B12 deficiency is here for follow-up. ? ?C/o left jaw pain for the last 2 to 3 months.  Especially movement.  Denies any dental issues.  S/p evaluation with ENT. ? ?Denies any difficulty in swallowing.  Denies any new lumps or bumps.  Patient denies any nausea vomiting.  Denies any fevers or chills. Chronic mild fatigue. ? ?Review of Systems  ?Constitutional:  Positive for malaise/fatigue. Negative for chills, diaphoresis, fever and weight loss.  ?HENT:  Negative for nosebleeds and sore throat.   ?Eyes:  Negative for double vision.  ?Respiratory:  Negative for cough, hemoptysis, sputum production, shortness of breath and wheezing.   ?Cardiovascular:  Negative for chest pain, palpitations, orthopnea and leg swelling.  ?Gastrointestinal:  Negative for abdominal pain, blood in stool, constipation, diarrhea, heartburn, melena, nausea and vomiting.  ?Genitourinary:  Negative for dysuria, frequency and urgency.  ?Musculoskeletal:  Positive for back pain and joint pain.  ?Skin: Negative.  Negative for itching and rash.  ?Neurological:  Negative for dizziness, tingling, focal weakness, weakness and headaches.  ?Endo/Heme/Allergies:  Does not bruise/bleed easily.  ?Psychiatric/Behavioral:  Negative for depression. The patient is not nervous/anxious and does not have insomnia.    ? ?MEDICAL HISTORY:  ?Past Medical History:  ?Diagnosis Date  ? Anxiety   ? Depression   ? GERD (gastroesophageal  reflux disease)   ? Throat cancer ([place]) 2021  ? Tongue cancer ([place])   ? ? ?SURGICAL HISTORY: ?Past Surgical History:  ?Procedure Laterality Date  ? COLONOSCOPY WITH PROPOFOL N/A 03/30/2020  ? Procedure: COLONOSCOPY WITH PROPOFOL;  Surgeon: Virgel Manifold, MD;  Location: [facility] ENDOSCOPY;  Service: Endoscopy;  Laterality: N/A;  COVID POSITIVE 02/29/2020  ? ESOPHAGOGASTRODUODENOSCOPY (EGD) WITH PROPOFOL N/A 03/30/2020  ? Procedure: ESOPHAGOGASTRODUODENOSCOPY (EGD) WITH PROPOFOL;  Surgeon: Virgel Manifold, MD;  Location: [facility] ENDOSCOPY;  Service: Endoscopy;  Laterality: N/A;  ? ESOPHAGOGASTRODUODENOSCOPY (EGD) WITH PROPOFOL N/A 08/03/2020  ? Procedure: ESOPHAGOGASTRODUODENOSCOPY (EGD) WITH PROPOFOL;  Surgeon: Virgel Manifold, MD;  Location: [facility] ENDOSCOPY;  Service: Endoscopy;  Laterality: N/A;  ? ESOPHAGOGASTRODUODENOSCOPY (EGD) WITH PROPOFOL N/A 01/25/2021  ? Procedure: ESOPHAGOGASTRODUODENOSCOPY (EGD) WITH PROPOFOL;  Surgeon: Virgel Manifold, MD;  Location: [facility] ENDOSCOPY;  Service: Endoscopy;  Laterality: N/A;  ? EXCISION MASS NECK Left 01/18/2019  ? Procedure: EXCISION MASS NECK/NODE;  Surgeon: Margaretha Sheffield, MD;  Location: [facility] ORS;  Service: ENT;  Laterality: Left;  ? LARYNGOSCOPY Bilateral 02/22/2019  ? Procedure: MICROSCOPIC DIRECT LARYNGOSCOPY AND BIOPSY;  Surgeon: Margaretha Sheffield, MD;  Location: [facility] ORS;  Service: ENT;  Laterality: Bilateral;  ? PORTA CATH INSERTION N/A 03/24/2019  ? Procedure: PORTA CATH INSERTION;  Surgeon: Katha Cabal, MD;  Location: [facility];  Service: Cardiovascular;  Laterality: N/A;  ? TONSILLECTOMY    ? TUBAL LIGATION    ? ? ?SOCIAL HISTORY: ?Social History  ? ?Socioeconomic History  ? Marital status: Single  ?  Spouse name: Not on file  ? Number of children: Not on file  ? Years of education: Not on file  ?  Highest education level: Not on file  ?Occupational History  ? Not on file  ?Tobacco Use  ? Smoking status: Never  ? Smokeless tobacco: Never   ?Vaping Use  ? Vaping Use: Never used  ?Substance and Sexual Activity  ? Alcohol use: Not Currently  ? Drug use: No  ? Sexual activity: Not on file  ?Other Topics Concern  ? Not on file  ?Social History Narrative  ? Not on file  ? ?Social Determinants of Health  ? ?Financial Resource Strain: Not on file  ?Food Insecurity: Not on file  ?Transportation Needs: Not on file  ?Physical Activity: Not on file  ?Stress: Not on file  ?Social Connections: Not on file  ?Intimate Partner Violence: Not on file  ? ? ?FAMILY HISTORY: ?Family History  ?Problem Relation Age of Onset  ? Breast cancer Sister   ? Brain cancer Paternal Grandfather   ? ? ?ALLERGIES:  is allergic to chlorhexidine. ? ?MEDICATIONS:  ?Current Outpatient Medications  ?Medication Sig Dispense Refill  ? gabapentin (NEURONTIN) 300 MG capsule Take 300 mg by mouth 4 (four) times daily.    ? hydrOXYzine (ATARAX/VISTARIL) 25 MG tablet Take 25 mg by mouth daily as needed for nausea, vomiting or anxiety.    ? meclizine (ANTIVERT) 12.5 MG tablet Take 1 tablet (12.5 mg total) by mouth 3 (three) times daily as needed for dizziness or nausea. 30 tablet 0  ? naproxen (NAPROSYN) 500 MG tablet Take 1 tablet (500 mg total) by mouth 2 (two) times daily with a meal. 20 tablet 0  ? omeprazole (PRILOSEC) 40 MG capsule Take 1 capsule (40 mg total) by mouth daily. 90 capsule 0  ? ondansetron (ZOFRAN ODT) 4 MG disintegrating tablet Take 1 tablet (4 mg total) by mouth every 6 (six) hours as needed for nausea or vomiting. 20 tablet 0  ? sertraline (ZOLOFT) 50 MG tablet Take 50 mg by mouth daily.    ? Vitamin D, Ergocalciferol, (DRISDOL) 1.25 MG (50000 UNIT) CAPS capsule Take 50,000 Units by mouth once a week.    ? ?No current facility-administered medications for this visit.  ? ?Facility-Administered Medications Ordered in Other Visits  ?Medication Dose Route Frequency Provider Last Rate Last Admin  ? cyanocobalamin ((VITAMIN B-12)) injection 1,000 mcg  1,000 mcg Intramuscular Once  Cammie Sickle, MD      ? ? ?  ?. ? ?PHYSICAL EXAMINATION: ? ?Vitals:  ? 05/22/21 0910  ?BP: 124/87  ?Pulse: 71  ?Temp: 97.7 ?F (36.5 ?C)  ?SpO2: 100%  ? ?Filed Weights  ? 05/22/21 0910  ?Weight: 175 lb 3.2 oz (79.5 kg)  ? ? ?Physical Exam ?Vitals and nursing note reviewed.  ?HENT:  ?   Head: Normocephalic and atraumatic.  ?   Mouth/Throat:  ?   Pharynx: Oropharynx is clear.  ?Eyes:  ?   Extraocular Movements: Extraocular movements intact.  ?   Pupils: Pupils are equal, round, and reactive to light.  ?Cardiovascular:  ?   Rate and Rhythm: Normal rate and regular rhythm.  ?Pulmonary:  ?   Comments: Decreased breath sounds bilaterally.  ?Abdominal:  ?   Palpations: Abdomen is soft.  ?Musculoskeletal:     ?   General: Normal range of motion.  ?   Cervical back: Normal range of motion.  ?Skin: ?   General: Skin is warm.  ?Neurological:  ?   General: No focal deficit present.  ?   Mental Status: She is alert and oriented to person, place, and time.  ?  Psychiatric:     ?   Behavior: Behavior normal.     ?   Judgment: Judgment normal.  ? ? ? ?LABORATORY DATA:  ?I have reviewed the data as listed ?Lab Results  ?Component Value Date  ? WBC 5.7 05/22/2021  ? HGB 12.3 05/22/2021  ? HCT 37.5 05/22/2021  ? MCV 89.3 05/22/2021  ? PLT 214 05/22/2021  ? ?Recent Labs  ?  05/30/20 ?1053 06/26/20 ?0505 06/30/20 ?2094 11/08/20 ?7096 11/21/20 ?2836 05/22/21 ?0900  ?NA 137   < > 142  --  138 137  ?K 4.2   < > 4.9  --  3.6 3.7  ?CL 104   < > 106  --  107 106  ?CO2 28   < > 29  --  26 23  ?GLUCOSE 90   < > 89  --  112* 157*  ?BUN 20   < > 20  --  18 15  ?CREATININE 0.93   < > 1.01* 1.00 0.96 1.04*  ?CALCIUM 9.0   < > 9.6  --  8.9 9.0  ?GFRNONAA >60   < > >60  --  >60 >60  ?PROT 6.8  --   --   --  7.0 7.0  ?ALBUMIN 3.8  --   --   --  4.0 3.8  ?AST 19  --   --   --  21 21  ?ALT 14  --   --   --  14 14  ?ALKPHOS 90  --   --   --  97 69  ?BILITOT 0.4  --   --   --  0.2* 0.6  ? < > = values in this interval not displayed.   ? ? ?RADIOGRAPHIC STUDIES: ?I have personally reviewed the radiological images as listed and agreed with the findings in the report. ?No results found. ? ?Carcinoma of base of tongue ([place]) ?#    Stage I left base of tongue

## 2021-05-30 ENCOUNTER — Ambulatory Visit
Admission: RE | Admit: 2021-05-30 | Discharge: 2021-05-30 | Disposition: A | Discharge status: Home / Self Care | Payer: Medicaid Other | Source: Ambulatory Visit | Attending: Internal Medicine | Admitting: Internal Medicine

## 2021-05-30 ENCOUNTER — Other Ambulatory Visit: Payer: Self-pay

## 2021-05-30 DIAGNOSIS — R6884 Jaw pain: Secondary | ICD-10-CM | POA: Insufficient documentation

## 2021-05-30 DIAGNOSIS — C01 Malignant neoplasm of base of tongue: Secondary | ICD-10-CM | POA: Diagnosis not present

## 2021-05-30 MED ORDER — IOHEXOL 300 MG/ML  SOLN
75.0000 mL | Freq: Once | INTRAMUSCULAR | Status: AC | PRN
  Administered 2021-05-30: 75 mL via INTRAVENOUS

## 2021-05-31 ENCOUNTER — Telehealth: Payer: Self-pay

## 2021-05-31 NOTE — Telephone Encounter (Signed)
Pt notified and verbalized understanding.

## 2021-05-31 NOTE — Telephone Encounter (Signed)
-----   Message from Cammie Sickle, MD sent at 05/30/2021  7:44 PM EDT ----- ?H/A- please inform pt that her CT scan- does not show any evidence of cancer recurrence. 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 ? ?Also- TSH is slightly abnormal. I would recommend follow up with PCP re: further recommendations. ? ?Please add Thyroid panel at next visit.  ? ?Thanks ?GB ?

## 2021-06-14 ENCOUNTER — Encounter: Payer: Self-pay | Admitting: Hematology and Oncology

## 2021-06-14 ENCOUNTER — Encounter: Payer: Self-pay | Admitting: Oncology

## 2021-06-21 ENCOUNTER — Inpatient Hospital Stay: Payer: Medicare Other | Attending: Internal Medicine

## 2021-06-21 DIAGNOSIS — E538 Deficiency of other specified B group vitamins: Secondary | ICD-10-CM | POA: Insufficient documentation

## 2021-06-21 DIAGNOSIS — T451X5A Adverse effect of antineoplastic and immunosuppressive drugs, initial encounter: Secondary | ICD-10-CM

## 2021-06-21 MED ORDER — CYANOCOBALAMIN 1000 MCG/ML IJ SOLN
1000.0000 ug | Freq: Once | INTRAMUSCULAR | Status: AC
  Administered 2021-06-21: 1000 ug via INTRAMUSCULAR
  Filled 2021-06-21: qty 1

## 2021-07-23 ENCOUNTER — Inpatient Hospital Stay: Payer: Medicare Other | Attending: Internal Medicine

## 2021-08-14 ENCOUNTER — Encounter: Payer: Self-pay | Admitting: Hematology and Oncology

## 2021-08-14 ENCOUNTER — Encounter: Payer: Self-pay | Admitting: Oncology

## 2021-08-20 ENCOUNTER — Other Ambulatory Visit: Payer: Self-pay

## 2021-08-20 ENCOUNTER — Ambulatory Visit: Payer: Medicare Other | Admitting: Gastroenterology

## 2021-08-20 ENCOUNTER — Encounter: Payer: Self-pay | Admitting: Gastroenterology

## 2021-08-20 VITALS — BP 131/71 | HR 91 | Temp 98.3°F | Ht 62.0 in | Wt 172.4 lb

## 2021-08-20 DIAGNOSIS — R1013 Epigastric pain: Secondary | ICD-10-CM

## 2021-08-20 DIAGNOSIS — Z8711 Personal history of peptic ulcer disease: Secondary | ICD-10-CM

## 2021-08-20 NOTE — Patient Instructions (Signed)
Ultrasound arrive at 8:30am at out patient imaging. Nothing to eat or drink after midnight. Address is [address] [place], [place] [address]. Phone number is [phone]

## 2021-08-20 NOTE — Progress Notes (Unsigned)
Cephas Darby, MD [address]  [place]  [place], [place] [address]  Main: [phone]  Fax: [phone]    Gastroenterology Consultation  Referring Provider:     [facility], [facility] Primary Care Physician:  [facility] Primary Gastroenterologist:  Dr. Bonna Gains Reason for Consultation:     Upper abdominal pain        HPI:   Connie Cameron is a 61 y.o. female referred by Dr. [facility]  for consultation & management of upper abdominal pain.  Patient reports 1 month history of upper abdominal pain.  She underwent upper endoscopy by Dr. Bonna Gains in 12/22 for dysphagia, found to have mild narrowing of Schatzki's ring which was dilated to 18 mm.  She also had scattered nonbleeding small ulcers in gastric antrum, biopsies did not reveal H. pylori.  Patient has been taking omeprazole 20 mg daily.  She has been gaining weight, gained about 10 pounds since last visit  NSAIDs: None  Antiplts/Anticoagulants/Anti thrombotics: None  GI Procedures:  EGD 01/25/2021 - Low-grade of narrowing Schatzki ring. Dilated to 18 mm. - Erythematous mucosa in the antrum. Biopsied. - Non-bleeding gastric ulcers with a clean ulcer base ([place] Class III). Biopsied. - Erosive gastropathy with no bleeding and no stigmata of recent bleeding. Biopsied. - Small hiatal hernia. - Duodenal erosion without bleeding. - Erythematous duodenopathy. - Biopsies were obtained in the gastric body, at the incisura and in the gastric antrum. DIAGNOSIS:  A. STOMACH ULCERS; COLD BIOPSY:  - CHRONIC AND FOCALLY ACTIVE GASTRITIS.  - NEGATIVE FOR HELICOBACTER PYLORI BY IMMUNOHISTOCHEMISTRY.  - NEGATIVE FOR INTESTINAL METAPLASIA, DYSPLASIA, AND MALIGNANCY.   Upper endoscopy 08/03/2020 - Mucosal nodule found in the esophagus. Biopsied. - White nummular lesions in esophageal mucosa. Brushings performed. - LA Grade A reflux esophagitis with no bleeding. - Erythematous mucosa in the antrum.  Biopsied. - A single mucosal papule (nodule) found in the stomach. Biopsied. - Mucosal nodule found in the duodenum. Biopsied. - Biopsies were obtained in the gastric body, at the incisura and in the gastric antrum. DIAGNOSIS:  A. DUODENUM BULB NODULE; COLD BIOPSY:  - NODULAR PEPTIC DUODENITIS.  - NEGATIVE FOR FEATURES OF CELIAC DISEASE.  - NEGATIVE FOR DYSPLASIA AND MALIGNANCY.   B. STOMACH, ANTRUM, INCISURA, BODY; COLD BIOPSY:  - CHRONIC GASTRITIS.  - NEGATIVE FOR ACTIVE INFLAMMATION AND H PYLORI.  - NEGATIVE FOR INTESTINAL METAPLASIA, DYSPLASIA, AND MALIGNANCY.   C. STOMACH NODULE, INCISURA; COLD BIOPSY:  - POLYPOID GASTRIC MUCOSA WITH NO SIGNIFICANT PATHOLOGIC ALTERATION.  - NEGATIVE FOR ACTIVE INFLAMMATION AND H PYLORI.  - NEGATIVE FOR INTESTINAL METAPLASIA, DYSPLASIA, AND MALIGNANCY.   D. ESOPHAGUS LESION, 20 CM; COLD BIOPSY:  - FRAGMENTS OF SQUAMOUS MUCOSA WITH FEATURES POTENTIALLY COMPATIBLE WITH SQUAMOUS PAPILLOMA.  - NEGATIVE FOR DYSPLASIA AND MALIGNANCY.   Colonoscopy 03/30/2020 - Diverticulosis in the sigmoid colon. - The examination was otherwise normal. - The rectum, sigmoid colon, descending colon, transverse colon, ascending colon and cecum are normal. - Non-bleeding internal hemorrhoids. - No specimens collected.  Upper endoscopy 03/30/2020 DIAGNOSIS:  A.  STOMACH; COLD BIOPSY:  - MODERATE CHRONIC ACTIVE GASTRITIS.  - IHC FOR H.PYLORI IS NEGATIVE, SEE COMMENT.  - NEGATIVE FOR DYSPLASIA AND MALIGNANCY.   B ESOPHAGUS; COLD BIOPSY:  - FRAGMENTS OF SQUAMOUS MUCOSA WITH PATCHY GLYCOGEN ACANTHOSIS AND FOCAL  MILD ACUTE ESOPHAGITIS.  - PASDF STAIN IS NEGATIVE FOR YEAST / FUNGAL HYPHAE; STAIN CONTROLS  WORKED APPROPRIATELY.  - FRAGMENTS OF GASTRIC CARDIAC TYPE MUCOSA WITH  MODERATE CHRONIC ACTIVE  GASTRITIS.  - NEGATIVE FOR INTRAEPITHELIAL EOSINOPHILS, DYSPLASIA, AND MALIGNANCY.   C.  ESOPHAGUS, 20 CM; COLD BIOPSY:  - BENIGN SQUAMOUS PAPILLOMA.  - NEGATIVE FOR  DYSPLASIA AND MALIGNANCY.   Past Medical History:  Diagnosis Date   Anxiety    Depression    GERD (gastroesophageal reflux disease)    Throat cancer ([place]) 2021   Tongue cancer [facility])     Past Surgical History:  Procedure Laterality Date   COLONOSCOPY WITH PROPOFOL N/A 03/30/2020   Procedure: COLONOSCOPY WITH PROPOFOL;  Surgeon: Virgel Manifold, MD;  Location: [facility] ENDOSCOPY;  Service: Endoscopy;  Laterality: N/A;  COVID POSITIVE 02/29/2020   ESOPHAGOGASTRODUODENOSCOPY (EGD) WITH PROPOFOL N/A 03/30/2020   Procedure: ESOPHAGOGASTRODUODENOSCOPY (EGD) WITH PROPOFOL;  Surgeon: Virgel Manifold, MD;  Location: [facility];  Service: Endoscopy;  Laterality: N/A;   ESOPHAGOGASTRODUODENOSCOPY (EGD) WITH PROPOFOL N/A 08/03/2020   Procedure: ESOPHAGOGASTRODUODENOSCOPY (EGD) WITH PROPOFOL;  Surgeon: Virgel Manifold, MD;  Location: [facility];  Service: Endoscopy;  Laterality: N/A;   ESOPHAGOGASTRODUODENOSCOPY (EGD) WITH PROPOFOL N/A 01/25/2021   Procedure: ESOPHAGOGASTRODUODENOSCOPY (EGD) WITH PROPOFOL;  Surgeon: Virgel Manifold, MD;  Location: [facility];  Service: Endoscopy;  Laterality: N/A;   EXCISION MASS NECK Left 01/18/2019   Procedure: EXCISION MASS NECK/NODE;  Surgeon: Margaretha Sheffield, MD;  Location: [facility];  Service: ENT;  Laterality: Left;   LARYNGOSCOPY Bilateral 02/22/2019   Procedure: MICROSCOPIC DIRECT LARYNGOSCOPY AND BIOPSY;  Surgeon: Margaretha Sheffield, MD;  Location: [facility];  Service: ENT;  Laterality: Bilateral;   PORTA CATH INSERTION N/A 03/24/2019   Procedure: PORTA CATH INSERTION;  Surgeon: Katha Cabal, MD;  Location: [facility];  Service: Cardiovascular;  Laterality: N/A;   TONSILLECTOMY     TUBAL LIGATION      Current Outpatient Medications:    gabapentin (NEURONTIN) 300 MG capsule, Take 300 mg by mouth 4 (four) times daily., Disp: , Rfl:    hydrOXYzine (ATARAX/VISTARIL) 25 MG tablet, Take 25 mg by mouth daily as needed for nausea,  vomiting or anxiety., Disp: , Rfl:    meclizine (ANTIVERT) 12.5 MG tablet, Take 1 tablet (12.5 mg total) by mouth 3 (three) times daily as needed for dizziness or nausea., Disp: 30 tablet, Rfl: 0   naproxen (NAPROSYN) 500 MG tablet, Take 1 tablet (500 mg total) by mouth 2 (two) times daily with a meal., Disp: 20 tablet, Rfl: 0   omeprazole (PRILOSEC) 40 MG capsule, Take 1 capsule (40 mg total) by mouth daily., Disp: 90 capsule, Rfl: 0   ondansetron (ZOFRAN ODT) 4 MG disintegrating tablet, Take 1 tablet (4 mg total) by mouth every 6 (six) hours as needed for nausea or vomiting., Disp: 20 tablet, Rfl: 0   sertraline (ZOLOFT) 100 MG tablet, Take 100 mg by mouth daily., Disp: , Rfl:    traZODone (DESYREL) 100 MG tablet, Take 100 mg by mouth daily., Disp: , Rfl:    Vitamin D, Ergocalciferol, (DRISDOL) 1.25 MG (50000 UNIT) CAPS capsule, Take 50,000 Units by mouth once a week., Disp: , Rfl:     Family History  Problem Relation Age of Onset   Breast cancer Sister    Brain cancer Paternal Grandfather      Social History   Tobacco Use   Smoking status: Never   Smokeless tobacco: Never  Vaping Use   Vaping Use: Never used  Substance Use Topics   Alcohol use: Not Currently   Drug use: No    Allergies as of 08/20/2021 -  Review Complete 08/20/2021  Allergen Reaction Noted   Chlorhexidine  06/29/2020    Review of Systems:    All systems reviewed and negative except where noted in HPI.   Physical Exam:  BP 131/71 (BP Location: Left Arm, Patient Position: Sitting, Cuff Size: Normal)   Pulse 91   Temp 98.3 F (36.8 C) (Oral)   Ht '5\' 2"'$  (1.575 m)   Wt 172 lb 6 oz (78.2 kg)   BMI 31.53 kg/m  No LMP recorded. Patient is postmenopausal.  General:   Alert,  Well-developed, well-nourished, pleasant and cooperative in NAD Head:  Normocephalic and atraumatic. Eyes:  Sclera clear, no icterus.   Conjunctiva pink. Ears:  Normal auditory acuity. Nose:  No deformity, discharge, or  lesions. Mouth:  No deformity or lesions,oropharynx pink & moist. Neck:  Supple; no masses or thyromegaly. Lungs:  Respirations even and unlabored.  Clear throughout to auscultation.   No wheezes, crackles, or rhonchi. No acute distress. Heart:  Regular rate and rhythm; no murmurs, clicks, rubs, or gallops. Abdomen:  Normal bowel sounds. Soft, non-tender and non-distended without masses, hepatosplenomegaly or hernias noted.  No guarding or rebound tenderness.   Rectal: Not performed Msk:  Symmetrical without gross deformities. Good, equal movement & strength bilaterally. Pulses:  Normal pulses noted. Extremities:  No clubbing or edema.  No cyanosis. Neurologic:  Alert and oriented x3;  grossly normal neurologically. Skin:  Intact without significant lesions or rashes. No jaundice. Psych:  Alert and cooperative. Normal mood and affect.  Imaging Studies: Reviewed  Assessment and Plan:   Connie Cameron is a 61 y.o. female with history of anxiety, depression, history of dysphagia s/p dilation of Schatzki's ring in 01/2021 to 18 mm is seen in consultation for 1 month history of upper abdominal pain  Upper abdominal pain without any other associated symptoms CT abdomen and pelvis in 4/22 did not reveal cholelithiasis Patient had history of gastric ulcers based on the endoscopy in 12/22 Increase omeprazole to 40 mg daily before breakfast Check H. pylori IgG and treat if positive If her symptoms are persistent, recommend right upper quadrant ultrasound +/- HIDA scan  History of B12 deficiency anemia Check vitamin B12 levels No evidence of iron deficiency, last serum ferritin levels were 93 in 10/22   Follow up in 4 months   Cephas Darby, MD

## 2021-08-21 ENCOUNTER — Inpatient Hospital Stay: Payer: Medicare Other | Attending: Internal Medicine

## 2021-08-21 ENCOUNTER — Inpatient Hospital Stay: Payer: Medicare Other

## 2021-08-21 DIAGNOSIS — C01 Malignant neoplasm of base of tongue: Secondary | ICD-10-CM

## 2021-08-21 DIAGNOSIS — Z79899 Other long term (current) drug therapy: Secondary | ICD-10-CM | POA: Insufficient documentation

## 2021-08-21 DIAGNOSIS — Z95828 Presence of other vascular implants and grafts: Secondary | ICD-10-CM

## 2021-08-21 DIAGNOSIS — T451X5A Adverse effect of antineoplastic and immunosuppressive drugs, initial encounter: Secondary | ICD-10-CM

## 2021-08-21 DIAGNOSIS — E538 Deficiency of other specified B group vitamins: Secondary | ICD-10-CM | POA: Diagnosis not present

## 2021-08-21 MED ORDER — HEPARIN SOD (PORK) LOCK FLUSH 100 UNIT/ML IV SOLN
500.0000 [IU] | Freq: Once | INTRAVENOUS | Status: AC
  Administered 2021-08-21: 500 [IU] via INTRAVENOUS
  Filled 2021-08-21: qty 5

## 2021-08-21 MED ORDER — CYANOCOBALAMIN 1000 MCG/ML IJ SOLN
1000.0000 ug | Freq: Once | INTRAMUSCULAR | Status: AC
  Administered 2021-08-21: 1000 ug via INTRAMUSCULAR
  Filled 2021-08-21: qty 1

## 2021-08-21 MED ORDER — SODIUM CHLORIDE 0.9% FLUSH
10.0000 mL | INTRAVENOUS | Status: DC | PRN
  Administered 2021-08-21: 10 mL via INTRAVENOUS
  Filled 2021-08-21: qty 10

## 2021-08-27 ENCOUNTER — Other Ambulatory Visit: Payer: Self-pay

## 2021-08-27 ENCOUNTER — Ambulatory Visit
Admission: RE | Admit: 2021-08-27 | Discharge: 2021-08-27 | Disposition: A | Discharge status: Home / Self Care | Payer: Medicare Other | Source: Ambulatory Visit | Attending: Gastroenterology | Admitting: Gastroenterology

## 2021-08-27 DIAGNOSIS — R1013 Epigastric pain: Secondary | ICD-10-CM | POA: Insufficient documentation

## 2021-08-28 ENCOUNTER — Telehealth: Payer: Self-pay

## 2021-08-28 NOTE — Telephone Encounter (Signed)
-----   Message from Lin Landsman, MD sent at 08/28/2021  3:35 PM EDT ----- Please inform patient that the ultrasound of her liver showed fatty liver only.  I will see her for upper endoscopy as scheduled  RV

## 2021-08-28 NOTE — Telephone Encounter (Signed)
Patient verbalized understanding of results  

## 2021-09-05 ENCOUNTER — Ambulatory Visit: Payer: Medicare Other | Admitting: Anesthesiology

## 2021-09-05 ENCOUNTER — Encounter
Admission: RE | Disposition: A | Discharge status: Home / Self Care | Payer: Self-pay | Source: Home / Self Care | Attending: Gastroenterology

## 2021-09-05 ENCOUNTER — Encounter: Payer: Self-pay | Admitting: Gastroenterology

## 2021-09-05 ENCOUNTER — Ambulatory Visit
Admission: RE | Admit: 2021-09-05 | Discharge: 2021-09-05 | Disposition: A | Discharge status: Home / Self Care | Payer: Medicare Other | Attending: Gastroenterology | Admitting: Gastroenterology

## 2021-09-05 DIAGNOSIS — Z8616 Personal history of COVID-19: Secondary | ICD-10-CM | POA: Diagnosis not present

## 2021-09-05 DIAGNOSIS — K2100 Gastro-esophageal reflux disease with esophagitis, without bleeding: Secondary | ICD-10-CM | POA: Diagnosis not present

## 2021-09-05 DIAGNOSIS — K449 Diaphragmatic hernia without obstruction or gangrene: Secondary | ICD-10-CM | POA: Insufficient documentation

## 2021-09-05 DIAGNOSIS — Z85810 Personal history of malignant neoplasm of tongue: Secondary | ICD-10-CM | POA: Diagnosis not present

## 2021-09-05 DIAGNOSIS — Z8711 Personal history of peptic ulcer disease: Secondary | ICD-10-CM | POA: Insufficient documentation

## 2021-09-05 DIAGNOSIS — F32A Depression, unspecified: Secondary | ICD-10-CM | POA: Insufficient documentation

## 2021-09-05 DIAGNOSIS — F419 Anxiety disorder, unspecified: Secondary | ICD-10-CM | POA: Diagnosis not present

## 2021-09-05 DIAGNOSIS — R1013 Epigastric pain: Secondary | ICD-10-CM | POA: Diagnosis present

## 2021-09-05 DIAGNOSIS — K2210 Ulcer of esophagus without bleeding: Secondary | ICD-10-CM

## 2021-09-05 HISTORY — PX: ESOPHAGOGASTRODUODENOSCOPY (EGD) WITH PROPOFOL: SHX5813

## 2021-09-05 SURGERY — ESOPHAGOGASTRODUODENOSCOPY (EGD) WITH PROPOFOL
Anesthesia: General

## 2021-09-05 MED ORDER — PROPOFOL 10 MG/ML IV BOLUS
INTRAVENOUS | Status: DC | PRN
  Administered 2021-09-05: 50 mg via INTRAVENOUS

## 2021-09-05 MED ORDER — LIDOCAINE HCL (PF) 2 % IJ SOLN
INTRAMUSCULAR | Status: AC
  Filled 2021-09-05: qty 5

## 2021-09-05 MED ORDER — SODIUM CHLORIDE 0.9 % IV SOLN
INTRAVENOUS | Status: DC
  Administered 2021-09-05: 1000 mL via INTRAVENOUS

## 2021-09-05 MED ORDER — PROPOFOL 500 MG/50ML IV EMUL
INTRAVENOUS | Status: DC | PRN
  Administered 2021-09-05: 150 ug/kg/min via INTRAVENOUS

## 2021-09-05 MED ORDER — OMEPRAZOLE 40 MG PO CPDR: 40.0000 mg | DELAYED_RELEASE_CAPSULE | Freq: Two times a day (BID) | ORAL | 2 refills | Status: DC

## 2021-09-05 MED ORDER — MIDAZOLAM HCL 2 MG/2ML IJ SOLN
INTRAMUSCULAR | Status: AC
  Filled 2021-09-05: qty 2

## 2021-09-05 MED ORDER — LIDOCAINE HCL (CARDIAC) PF 100 MG/5ML IV SOSY
PREFILLED_SYRINGE | INTRAVENOUS | Status: DC | PRN
  Administered 2021-09-05: 80 mg via INTRAVENOUS

## 2021-09-05 MED ORDER — PROPOFOL 1000 MG/100ML IV EMUL
INTRAVENOUS | Status: AC
  Filled 2021-09-05: qty 100

## 2021-09-05 NOTE — Op Note (Signed)
[facility] Gastroenterology Patient Name: Connie Cameron Procedure Date: 09/05/2021 9:58 AM MRN: [id number] Account #: [id number] Date of Birth: [birth year] Admit Type: Outpatient Age: 62 Room: [facility] ENDO ROOM 4 Gender: Female Note Status: Finalized Instrument Name: Upper Endoscope [id number] Procedure:             Upper GI endoscopy Indications:           Epigastric abdominal pain Providers:             Lin Landsman MD, MD Referring MD:          No Local Md, MD (Referring MD) Medicines:             General Anesthesia Complications:         No immediate complications. Estimated blood loss: None. Procedure:             Pre-Anesthesia Assessment:                        - Prior to the procedure, a History and Physical was                         performed, and patient medications and allergies were                         reviewed. The patient is competent. The risks and                         benefits of the procedure and the sedation options and                         risks were discussed with the patient. All questions                         were answered and informed consent was obtained.                         Patient identification and proposed procedure were                         verified by the physician, the nurse, the                         anesthesiologist, the anesthetist and the technician                         in the pre-procedure area in the procedure room in the                         endoscopy suite. Mental Status Examination: alert and                         oriented. Airway Examination: normal oropharyngeal                         airway and neck mobility. Respiratory Examination:                         clear to auscultation. CV Examination: normal.  Prophylactic Antibiotics: The patient does not require                         prophylactic antibiotics. Prior Anticoagulants: The                         patient has  taken no previous anticoagulant or                         antiplatelet agents. ASA Grade Assessment: II - A                         patient with mild systemic disease. After reviewing                         the risks and benefits, the patient was deemed in                         satisfactory condition to undergo the procedure. The                         anesthesia plan was to use general anesthesia.                         Immediately prior to administration of medications,                         the patient was re-assessed for adequacy to receive                         sedatives. The heart rate, respiratory rate, oxygen                         saturations, blood pressure, adequacy of pulmonary                         ventilation, and response to care were monitored                         throughout the procedure. The physical status of the                         patient was re-assessed after the procedure.                        After obtaining informed consent, the endoscope was                         passed under direct vision. Throughout the procedure,                         the patient's blood pressure, pulse, and oxygen                         saturations were monitored continuously. The Endoscope                         was introduced through the mouth, and advanced to the  second part of duodenum. The upper GI endoscopy was                         accomplished without difficulty. The patient tolerated                         the procedure well. Findings:      The duodenal bulb and second portion of the duodenum were normal.      A medium-sized hiatal hernia was present.      The entire examined stomach was normal.      The cardia and gastric fundus were normal on retroflexion.      LA Grade B (one or more mucosal breaks greater than 5 mm, not extending       between the tops of two mucosal folds) esophagitis with no bleeding was       found in the  lower third of the esophagus. Impression:            - Normal duodenal bulb and second portion of the                         duodenum.                        - Medium-sized hiatal hernia.                        - Normal stomach.                        - LA Grade B reflux esophagitis with no bleeding.                        - No specimens collected. Recommendation:        - Discharge patient to home (with escort).                        - Resume previous diet today.                        - Follow an antireflux regimen indefinitely.                        - Use Prilosec (omeprazole) 40 mg PO BID for 3 months. Procedure Code(s):     --- Professional ---                        [phone], Esophagogastroduodenoscopy, flexible,                         transoral; diagnostic, including collection of                         specimen(s) by brushing or washing, when performed                         (separate procedure) Diagnosis Code(s):     --- Professional ---                        K44.9, Diaphragmatic hernia without obstruction or  gangrene                        K21.00, Gastro-esophageal reflux disease with                         esophagitis, without bleeding                        R10.13, Epigastric pain CPT copyright 2019 [employer] Association. All rights reserved. The codes documented in this report are preliminary and upon coder review may  be revised to meet current compliance requirements. Dr. Ulyess Mort Lin Landsman MD, MD 09/05/2021 10:21:56 AM This report has been signed electronically. Number of Addenda: 0 Note Initiated On: 09/05/2021 9:58 AM Estimated Blood Loss:  Estimated blood loss: none.      [facility]

## 2021-09-05 NOTE — H&P (Signed)
Cephas Darby, MD [address]  [place]  [place], [place] [address]  Main: [phone]  Fax: [phone] Pager: [phone]  Primary Care Physician:  [employer] [place] Primary Gastroenterologist:  Dr. Cephas Darby  Pre-Procedure History & Physical: HPI:  Connie Cameron is a 61 y.o. female is here for an endoscopy.   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   Tongue cancer [facility])     Past Surgical History:  Procedure Laterality Date   COLONOSCOPY WITH PROPOFOL N/A 03/30/2020   Procedure: COLONOSCOPY WITH PROPOFOL;  Surgeon: Virgel Manifold, MD;  Location: [facility] ENDOSCOPY;  Service: Endoscopy;  Laterality: N/A;  COVID POSITIVE 02/29/2020   ESOPHAGOGASTRODUODENOSCOPY (EGD) WITH PROPOFOL N/A 03/30/2020   Procedure: ESOPHAGOGASTRODUODENOSCOPY (EGD) WITH PROPOFOL;  Surgeon: Virgel Manifold, MD;  Location: [facility];  Service: Endoscopy;  Laterality: N/A;   ESOPHAGOGASTRODUODENOSCOPY (EGD) WITH PROPOFOL N/A 08/03/2020   Procedure: ESOPHAGOGASTRODUODENOSCOPY (EGD) WITH PROPOFOL;  Surgeon: Virgel Manifold, MD;  Location: [facility];  Service: Endoscopy;  Laterality: N/A;   ESOPHAGOGASTRODUODENOSCOPY (EGD) WITH PROPOFOL N/A 01/25/2021   Procedure: ESOPHAGOGASTRODUODENOSCOPY (EGD) WITH PROPOFOL;  Surgeon: Virgel Manifold, MD;  Location: [facility];  Service: Endoscopy;  Laterality: N/A;   EXCISION MASS NECK Left 01/18/2019   Procedure: EXCISION MASS NECK/NODE;  Surgeon: Margaretha Sheffield, MD;  Location: [facility];  Service: ENT;  Laterality: Left;   LARYNGOSCOPY Bilateral 02/22/2019   Procedure: MICROSCOPIC DIRECT LARYNGOSCOPY AND BIOPSY;  Surgeon: Margaretha Sheffield, MD;  Location: [facility];  Service: ENT;  Laterality: Bilateral;   PORTA CATH INSERTION N/A 03/24/2019   Procedure: PORTA CATH INSERTION;  Surgeon: Katha Cabal, MD;  Location: [place] [facility];   Service: Cardiovascular;  Laterality: N/A;   TONSILLECTOMY     TUBAL LIGATION      Prior to Admission medications   Medication Sig Start Date End Date Taking? Authorizing Provider  gabapentin (NEURONTIN) 300 MG capsule Take 300 mg by mouth 4 (four) times daily. 09/27/20  Yes [provider]  hydrOXYzine (ATARAX/VISTARIL) 25 MG tablet Take 25 mg by mouth daily as needed for nausea, vomiting or anxiety. 06/15/20  Yes [provider]  naproxen (NAPROSYN) 500 MG tablet Take 1 tablet (500 mg total) by mouth 2 (two) times daily with a meal. 12/25/20  Yes Carrie Mew, MD  omeprazole (PRILOSEC) 40 MG capsule Take 1 capsule (40 mg total) by mouth daily. 04/18/21  Yes Kamerin Axford, Tally Due, MD  sertraline (ZOLOFT) 100 MG tablet Take 100 mg by mouth daily. 06/04/21  Yes [provider]  traZODone (DESYREL) 100 MG tablet Take 100 mg by mouth daily. 06/04/21  Yes [provider]  Vitamin D, Ergocalciferol, (DRISDOL) 1.25 MG (50000 UNIT) CAPS capsule Take 50,000 Units by mouth once a week. 06/16/20  Yes [provider]  meclizine (ANTIVERT) 12.5 MG tablet Take 1 tablet (12.5 mg total) by mouth 3 (three) times daily as needed for dizziness or nausea. 06/30/20   Dwyane Dee, MD  ondansetron (ZOFRAN ODT) 4 MG disintegrating tablet Take 1 tablet (4 mg total) by mouth every 6 (six) hours as needed for nausea or vomiting. 06/26/20   Ward, Delice Bison, DO    Allergies as of 08/20/2021 - Review Complete 08/20/2021  Allergen Reaction Noted   Chlorhexidine  06/29/2020    Family History  Problem Relation Age of Onset   Breast cancer  Sister    Brain cancer Paternal Grandfather     Social History   Socioeconomic History   Marital status: Single    Spouse name: Not on file   Number of children: Not on file   Years of education: Not on file   Highest education level: Not on file  Occupational History   Not on file  Tobacco Use   Smoking status: Never   Smokeless  tobacco: Never  Vaping Use   Vaping Use: Never used  Substance and Sexual Activity   Alcohol use: Not Currently   Drug use: No   Sexual activity: Not on file  Other Topics Concern   Not on file  Social History Narrative   Not on file   Social Determinants of Health   Financial Resource Strain: Not on file  Food Insecurity: Not on file  Transportation Needs: Not on file  Physical Activity: Not on file  Stress: Not on file  Social Connections: Not on file  Intimate Partner Violence: Not on file    Review of Systems: See HPI, otherwise negative ROS  Physical Exam: BP (!) 129/95   Pulse 68   Temp (!) 96.6 F (35.9 C) (Temporal)   Resp 16   Ht '5\' 2"'$  (1.575 m)   Wt 79.1 kg   SpO2 100%   BMI 31.89 kg/m  General:   Alert,  pleasant and cooperative in NAD Head:  Normocephalic and atraumatic. Neck:  Supple; no masses or thyromegaly. Lungs:  Clear throughout to auscultation.    Heart:  Regular rate and rhythm. Abdomen:  Soft, nontender and nondistended. Normal bowel sounds, without guarding, and without rebound.   Neurologic:  Alert and  oriented x4;  grossly normal neurologically.  Impression/Plan: Connie Cameron is here for an endoscopy to be performed for follow up of gastric ulcer  Risks, benefits, limitations, and alternatives regarding  endoscopy have been reviewed with the patient.  Questions have been answered.  All parties agreeable.   Sherri Sear, MD  09/05/2021, 10:07 AM

## 2021-09-05 NOTE — Transfer of Care (Signed)
Immediate Anesthesia Transfer of Care Note  Patient: Connie Cameron  Procedure(s) Performed: ESOPHAGOGASTRODUODENOSCOPY (EGD) WITH PROPOFOL  Patient Location: PACU  Anesthesia Type:General  Level of Consciousness: sedated  Airway & Oxygen Therapy: Patient Spontanous Breathing and Patient connected to nasal cannula oxygen  Post-op Assessment: Report given to RN and Post -op Vital signs reviewed and stable  Post vital signs: Reviewed and stable  Last Vitals:  Vitals Value Taken Time  BP 129/96 09/05/21 1024  Temp 36.4 C 09/05/21 1024  Pulse 92 09/05/21 1026  Resp 21 09/05/21 1026  SpO2 98 % 09/05/21 1026  Vitals shown include unvalidated device data.  Last Pain:  Vitals:   09/05/21 1024  TempSrc: Temporal  PainSc: 0-No pain         Complications: No notable events documented.

## 2021-09-05 NOTE — Anesthesia Preprocedure Evaluation (Signed)
Anesthesia Evaluation  Patient identified by MRN, date of birth, ID band Patient awake    Reviewed: Allergy & Precautions, NPO status , Patient's Chart, lab work & pertinent test results  Airway Mallampati: II  TM Distance: >3 FB Neck ROM: Full    Dental  (+) Teeth Intact   Pulmonary neg pulmonary ROS,    Pulmonary exam normal breath sounds clear to auscultation       Cardiovascular Exercise Tolerance: Good negative cardio ROS Normal cardiovascular exam Rhythm:Regular     Neuro/Psych Anxiety Depression negative neurological ROS  negative psych ROS   GI/Hepatic negative GI ROS, Neg liver ROS, PUD, GERD  Medicated,  Endo/Other  negative endocrine ROS  Renal/GU negative Renal ROS  negative genitourinary   Musculoskeletal   Abdominal   Peds negative pediatric ROS (+)  Hematology negative hematology ROS (+)   Anesthesia Other Findings Past Medical History: No date: Anxiety No date: Depression No date: GERD (gastroesophageal reflux disease) 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[place]) No date: Tongue cancer [facility])  Past Surgical History: 03/30/2020: COLONOSCOPY WITH PROPOFOL; N/A     Comment:  Procedure: COLONOSCOPY WITH PROPOFOL;  Surgeon:               Virgel Manifold, MD;  Location: [facility];                Service: Endoscopy;  Laterality: N/A;  COVID POSITIVE               02/29/2020 03/30/2020: ESOPHAGOGASTRODUODENOSCOPY (EGD) WITH PROPOFOL; N/A     Comment:  Procedure: ESOPHAGOGASTRODUODENOSCOPY (EGD) WITH               PROPOFOL;  Surgeon: Virgel Manifold, MD;  Location:               [facility];  Service: Endoscopy;  Laterality: N/A; 08/03/2020: ESOPHAGOGASTRODUODENOSCOPY (EGD) WITH PROPOFOL; N/A     Comment:  Procedure: ESOPHAGOGASTRODUODENOSCOPY (EGD) WITH               PROPOFOL;  Surgeon: Virgel Manifold, MD;  Location:               [facility];  Service: Endoscopy;  Laterality: N/A; 01/25/2021:  ESOPHAGOGASTRODUODENOSCOPY (EGD) WITH PROPOFOL; N/A     Comment:  Procedure: ESOPHAGOGASTRODUODENOSCOPY (EGD) WITH               PROPOFOL;  Surgeon: Virgel Manifold, MD;  Location:               [facility];  Service: Endoscopy;  Laterality: N/A; 01/18/2019: EXCISION MASS NECK; Left     Comment:  Procedure: EXCISION MASS NECK/NODE;  Surgeon: Margaretha Sheffield, MD;  Location: [facility];  Service: ENT;                Laterality: Left; 02/22/2019: LARYNGOSCOPY; Bilateral     Comment:  Procedure: MICROSCOPIC DIRECT LARYNGOSCOPY AND BIOPSY;                Surgeon: Margaretha Sheffield, MD;  Location: [facility];                Service: ENT;  Laterality: Bilateral; 03/24/2019: PORTA CATH INSERTION; N/A     Comment:  Procedure: PORTA CATH INSERTION;  Surgeon: Katha Cabal, MD;  Location: [place] [facility];  Service:  Cardiovascular;  Laterality: N/A; No date: TONSILLECTOMY No date: TUBAL LIGATION     Reproductive/Obstetrics negative OB ROS                             Anesthesia Physical Anesthesia Plan  ASA: 2  Anesthesia Plan: General   Post-op Pain Management:    Induction: Intravenous  PONV Risk Score and Plan: Propofol infusion and TIVA  Airway Management Planned: Natural Airway  Additional Equipment:   Intra-op Plan:   Post-operative Plan:   Informed Consent: I have reviewed the patients History and Physical, chart, labs and discussed the procedure including the risks, benefits and alternatives for the proposed anesthesia with the patient or authorized representative who has indicated his/her understanding and acceptance.     Dental Advisory Given  Plan Discussed with: CRNA and Surgeon  Anesthesia Plan Comments:         Anesthesia Quick Evaluation

## 2021-09-05 NOTE — Anesthesia Postprocedure Evaluation (Signed)
Anesthesia Post Note  Patient: Connie Cameron  Procedure(s) Performed: ESOPHAGOGASTRODUODENOSCOPY (EGD) WITH PROPOFOL  Patient location during evaluation: PACU Anesthesia Type: General Level of consciousness: awake and awake and alert Pain management: pain level controlled Vital Signs Assessment: post-procedure vital signs reviewed and stable Respiratory status: spontaneous breathing and nonlabored ventilation Cardiovascular status: stable Anesthetic complications: no   No notable events documented.   Last Vitals:  Vitals:   09/05/21 1024 09/05/21 1033  BP: (!) 129/96   Pulse: 84 81  Resp: (!) 21 19  Temp: 36.4 C   SpO2: 90% 100%    Last Pain:  Vitals:   09/05/21 1033  TempSrc:   PainSc: 0-No pain                 VAN STAVEREN,Lamount Bankson

## 2021-09-06 ENCOUNTER — Other Ambulatory Visit: Payer: Self-pay

## 2021-09-06 ENCOUNTER — Encounter: Payer: Self-pay | Admitting: Gastroenterology

## 2021-09-21 ENCOUNTER — Inpatient Hospital Stay: Payer: Medicare Other | Attending: Internal Medicine

## 2021-09-21 DIAGNOSIS — D519 Vitamin B12 deficiency anemia, unspecified: Secondary | ICD-10-CM | POA: Insufficient documentation

## 2021-09-21 DIAGNOSIS — D701 Agranulocytosis secondary to cancer chemotherapy: Secondary | ICD-10-CM

## 2021-09-21 MED ORDER — CYANOCOBALAMIN 1000 MCG/ML IJ SOLN
1000.0000 ug | Freq: Once | INTRAMUSCULAR | Status: AC
  Administered 2021-09-21: 1000 ug via INTRAMUSCULAR
  Filled 2021-09-21: qty 1

## 2021-10-05 ENCOUNTER — Other Ambulatory Visit: Payer: Self-pay

## 2021-10-22 ENCOUNTER — Inpatient Hospital Stay: Payer: Medicare Other | Attending: Internal Medicine

## 2021-10-22 DIAGNOSIS — T451X5A Adverse effect of antineoplastic and immunosuppressive drugs, initial encounter: Secondary | ICD-10-CM

## 2021-10-22 DIAGNOSIS — D519 Vitamin B12 deficiency anemia, unspecified: Secondary | ICD-10-CM | POA: Diagnosis present

## 2021-10-22 MED ORDER — CYANOCOBALAMIN 1000 MCG/ML IJ SOLN
1000.0000 ug | Freq: Once | INTRAMUSCULAR | Status: AC
  Administered 2021-10-22: 1000 ug via INTRAMUSCULAR
  Filled 2021-10-22: qty 1

## 2021-11-21 ENCOUNTER — Inpatient Hospital Stay (HOSPITAL_BASED_OUTPATIENT_CLINIC_OR_DEPARTMENT_OTHER): Payer: Medicare Other | Admitting: Internal Medicine

## 2021-11-21 ENCOUNTER — Encounter: Payer: Self-pay | Admitting: Internal Medicine

## 2021-11-21 ENCOUNTER — Inpatient Hospital Stay: Payer: Medicare Other | Attending: Internal Medicine

## 2021-11-21 ENCOUNTER — Inpatient Hospital Stay: Payer: Medicare Other

## 2021-11-21 DIAGNOSIS — Z79899 Other long term (current) drug therapy: Secondary | ICD-10-CM | POA: Insufficient documentation

## 2021-11-21 DIAGNOSIS — C01 Malignant neoplasm of base of tongue: Secondary | ICD-10-CM

## 2021-11-21 DIAGNOSIS — Z08 Encounter for follow-up examination after completed treatment for malignant neoplasm: Secondary | ICD-10-CM | POA: Diagnosis not present

## 2021-11-21 DIAGNOSIS — D519 Vitamin B12 deficiency anemia, unspecified: Secondary | ICD-10-CM | POA: Diagnosis present

## 2021-11-21 DIAGNOSIS — Z8589 Personal history of malignant neoplasm of other organs and systems: Secondary | ICD-10-CM | POA: Diagnosis not present

## 2021-11-21 DIAGNOSIS — T451X5A Adverse effect of antineoplastic and immunosuppressive drugs, initial encounter: Secondary | ICD-10-CM

## 2021-11-21 LAB — CBC WITH DIFFERENTIAL/PLATELET
Abs Immature Granulocytes: 0.02 10*3/uL (ref 0.00–0.07)
Basophils Absolute: 0 10*3/uL (ref 0.0–0.1)
Basophils Relative: 1 %
Eosinophils Absolute: 0.1 10*3/uL (ref 0.0–0.5)
Eosinophils Relative: 2 %
HCT: 38.5 % (ref 36.0–46.0)
Hemoglobin: 12.4 g/dL (ref 12.0–15.0)
Immature Granulocytes: 0 %
Lymphocytes Relative: 14 %
Lymphs Abs: 1 10*3/uL (ref 0.7–4.0)
MCH: 29.4 pg (ref 26.0–34.0)
MCHC: 32.2 g/dL (ref 30.0–36.0)
MCV: 91.2 fL (ref 80.0–100.0)
Monocytes Absolute: 0.4 10*3/uL (ref 0.1–1.0)
Monocytes Relative: 6 %
Neutro Abs: 5.4 10*3/uL (ref 1.7–7.7)
Neutrophils Relative %: 77 %
Platelets: 228 10*3/uL (ref 150–400)
RBC: 4.22 MIL/uL (ref 3.87–5.11)
RDW: 13.4 % (ref 11.5–15.5)
WBC: 7 10*3/uL (ref 4.0–10.5)
nRBC: 0 % (ref 0.0–0.2)

## 2021-11-21 LAB — COMPREHENSIVE METABOLIC PANEL
ALT: 15 U/L (ref 0–44)
AST: 23 U/L (ref 15–41)
Albumin: 4 g/dL (ref 3.5–5.0)
Alkaline Phosphatase: 82 U/L (ref 38–126)
Anion gap: 6 (ref 5–15)
BUN: 19 mg/dL (ref 8–23)
CO2: 27 mmol/L (ref 22–32)
Calcium: 9.5 mg/dL (ref 8.9–10.3)
Chloride: 107 mmol/L (ref 98–111)
Creatinine, Ser: 1.12 mg/dL — ABNORMAL HIGH (ref 0.44–1.00)
GFR, Estimated: 56 mL/min — ABNORMAL LOW (ref >60)
Glucose, Bld: 166 mg/dL — ABNORMAL HIGH (ref 70–99)
Potassium: 3.8 mmol/L (ref 3.5–5.1)
Sodium: 140 mmol/L (ref 135–145)
Total Bilirubin: 0.6 mg/dL (ref 0.3–1.2)
Total Protein: 7.2 g/dL (ref 6.5–8.1)

## 2021-11-21 LAB — TSH: TSH: 5.494 u[IU]/mL — ABNORMAL HIGH (ref 0.350–4.500)

## 2021-11-21 LAB — VITAMIN B12: Vitamin B-12: 369 pg/mL (ref 180–914)

## 2021-11-21 MED ORDER — CYANOCOBALAMIN 1000 MCG/ML IJ SOLN
1000.0000 ug | Freq: Once | INTRAMUSCULAR | Status: AC
  Administered 2021-11-21: 1000 ug via INTRAMUSCULAR
  Filled 2021-11-21: qty 1

## 2021-11-21 NOTE — Assessment & Plan Note (Addendum)
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 Ladene Artist q 85M.  Clinically no evidence of recurrence; see discussion below.  # Left jaw pain- [3 months] s/p Eval with Dr.Juengle. ? TMJ; CT scan- NED s/p muscle relxaant- s/p orthodontist- STABLE.    #Normocytic anemia-with hemoglobin normal at 12.3-  [ EGD colonoscopy-February 2022 [Dr.Tahiliani]- STABLE   # Elevated BG- PBF- BG 166- ? Prediabetes- recommend follow up with PCP.   #  history of B12 deficiency-continue B12 injections monthly x6- .  # Mediport in place-stable flush every 2 to 3 months  # Port/IV access- Stable; discussed re: pro and cons of keeping the port vs. Explantation.  After lengthy discussion patient plans to have the port taken out. Will make an appointment with IR.  # DISPOSITION:  # port explanttaion refer to IR # B12 injection today;  # B12 injections monthly x6  # 3 months- port flush # follow up in 6 months- MD: labs- cbc/cmp/TSH; B12 levels; B12 injection; - Dr.B

## 2021-11-21 NOTE — Progress Notes (Signed)
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  HISTORY OF PRESENTING ILLNESS:  Connie Cameron 61 y.o.  female history of base of tongue squamous cell carcinoma; and also B12 deficiency is here for follow-up.  Patient reports increased fatigue.  New right knee pain (7/10 pain today) for the past 3 months that has been evaluated by PCP with no improvement.  Denies any difficulty in swallowing.  Denies any new lumps or bumps.  Patient denies any nausea vomiting.  Denies any fevers or chills. Chronic mild fatigue.  Review of Systems  Constitutional:  Positive for malaise/fatigue. Negative for chills, diaphoresis, fever and weight loss.  HENT:  Negative for nosebleeds and sore throat.   Eyes:  Negative for double vision.  Respiratory:  Negative for cough, hemoptysis, sputum production, shortness of breath and wheezing.   Cardiovascular:  Negative for chest pain, palpitations, orthopnea and leg swelling.  Gastrointestinal:  Negative for abdominal pain, blood in stool, constipation, diarrhea, heartburn, melena, nausea and vomiting.  Genitourinary:  Negative for dysuria, frequency and urgency.  Musculoskeletal:  Positive for back pain and joint pain.  Skin: Negative.  Negative for itching and rash.  Neurological:  Negative for dizziness, tingling, focal weakness, weakness and headaches.  Endo/Heme/Allergies:  Does not bruise/bleed easily.  Psychiatric/Behavioral:  Negative for depression. The patient is not nervous/anxious and does not have insomnia.      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   Tongue cancer ([place])     SURGICAL HISTORY: Past Surgical History:  Procedure Laterality Date   COLONOSCOPY WITH PROPOFOL N/A 03/30/2020   Procedure: COLONOSCOPY WITH PROPOFOL;  Surgeon: Virgel Manifold, MD;  Location: [facility];  Service: Endoscopy;  Laterality: N/A;  COVID POSITIVE 02/29/2020   ESOPHAGOGASTRODUODENOSCOPY (EGD) WITH PROPOFOL N/A 03/30/2020   Procedure: ESOPHAGOGASTRODUODENOSCOPY (EGD) WITH PROPOFOL;  Surgeon: Virgel Manifold, MD;  Location: [facility];  Service: Endoscopy;  Laterality: N/A;   ESOPHAGOGASTRODUODENOSCOPY (EGD) WITH PROPOFOL N/A 08/03/2020   Procedure: ESOPHAGOGASTRODUODENOSCOPY (EGD) WITH PROPOFOL;  Surgeon: Virgel Manifold, MD;  Location: [facility];  Service: Endoscopy;  Laterality: N/A;   ESOPHAGOGASTRODUODENOSCOPY (EGD) WITH PROPOFOL N/A 01/25/2021   Procedure: ESOPHAGOGASTRODUODENOSCOPY (EGD) WITH PROPOFOL;  Surgeon: Virgel Manifold, MD;  Location: [facility];  Service: Endoscopy;  Laterality: N/A;   ESOPHAGOGASTRODUODENOSCOPY (EGD) WITH PROPOFOL N/A 09/05/2021   Procedure: ESOPHAGOGASTRODUODENOSCOPY (EGD) WITH PROPOFOL;  Surgeon: Lin Landsman, MD;  Location: [facility] ENDOSCOPY;  Service: Gastroenterology;  Laterality: N/A;   EXCISION MASS NECK Left 01/18/2019   Procedure: EXCISION MASS NECK/NODE;  Surgeon: Margaretha Sheffield, MD;  Location: [facility];  Service: ENT;  Laterality: Left;   LARYNGOSCOPY Bilateral 02/22/2019   Procedure: MICROSCOPIC DIRECT LARYNGOSCOPY AND BIOPSY;  Surgeon: Margaretha Sheffield, MD;  Location: [facility];  Service: ENT;  Laterality: Bilateral;   PORTA CATH INSERTION N/A 03/24/2019   Procedure: PORTA CATH INSERTION;  Surgeon: Katha Cabal, MD;  Location: [place] [facility];  Service: Cardiovascular;  Laterality: N/A;   TONSILLECTOMY     TUBAL LIGATION      SOCIAL HISTORY: Social  History   Socioeconomic History   Marital  status: Single    Spouse name: Not on file   Number of children: Not on file   Years of education: Not on file   Highest education level: Not on file  Occupational History   Not on file  Tobacco Use   Smoking status: Never   Smokeless tobacco: Never  Vaping Use   Vaping Use: Never used  Substance and Sexual Activity   Alcohol use: Not Currently   Drug use: No   Sexual activity: Not on file  Other Topics Concern   Not on file  Social History Narrative   Not on file   Social Determinants of Health   Financial Resource Strain: Not on file  Food Insecurity: Not on file  Transportation Needs: Not on file  Physical Activity: Not on file  Stress: Not on file  Social Connections: Not on file  Intimate Partner Violence: Not on file    FAMILY HISTORY: Family History  Problem Relation Age of Onset   Breast cancer Sister    Brain cancer Paternal Grandfather     ALLERGIES:  is allergic to chlorhexidine.  MEDICATIONS:  Current Outpatient Medications  Medication Sig Dispense Refill   atorvastatin (LIPITOR) 20 MG tablet Take 20 mg by mouth daily.     baclofen (LIORESAL) 10 MG tablet Take 10 mg by mouth at bedtime as needed.     gabapentin (NEURONTIN) 300 MG capsule Take 300 mg by mouth 4 (four) times daily.     hydrOXYzine (ATARAX/VISTARIL) 25 MG tablet Take 25 mg by mouth daily as needed for nausea, vomiting or anxiety.     meclizine (ANTIVERT) 12.5 MG tablet Take 1 tablet (12.5 mg total) by mouth 3 (three) times daily as needed for dizziness or nausea. 30 tablet 0   omeprazole (PRILOSEC) 40 MG capsule Take 1 capsule (40 mg total) by mouth 2 (two) times daily before a meal. 60 capsule 2   sertraline (ZOLOFT) 100 MG tablet Take 100 mg by mouth daily.     Vitamin D, Ergocalciferol, (DRISDOL) 1.25 MG (50000 UNIT) CAPS capsule Take 50,000 Units by mouth once a week.     naproxen (NAPROSYN) 500 MG tablet Take 1 tablet (500 mg total) by mouth 2 (two) times daily with a meal. (Patient  not taking: Reported on 09/05/2021) 20 tablet 0   ondansetron (ZOFRAN ODT) 4 MG disintegrating tablet Take 1 tablet (4 mg total) by mouth every 6 (six) hours as needed for nausea or vomiting. (Patient not taking: Reported on 11/21/2021) 20 tablet 0   traZODone (DESYREL) 100 MG tablet Take 100 mg by mouth daily. (Patient not taking: Reported on 11/21/2021)     No current facility-administered medications for this visit.      Marland Kitchen  PHYSICAL EXAMINATION:  Vitals:   11/21/21 0900  BP: 114/83  Pulse: 81  Resp: 16  Temp: (!) 97.4 F (36.3 C)   Filed Weights   11/21/21 0900  Weight: 171 lb 4.8 oz (77.7 kg)    Physical Exam Vitals and nursing note reviewed.  HENT:     Head: Normocephalic and atraumatic.     Mouth/Throat:     Pharynx: Oropharynx is clear.  Eyes:     Extraocular Movements: Extraocular movements intact.     Pupils: Pupils are equal, round, and reactive to light.  Cardiovascular:     Rate and Rhythm: Normal rate and regular rhythm.  Pulmonary:     Comments: Decreased breath sounds bilaterally.  Abdominal:     Palpations: Abdomen is soft.  Musculoskeletal:        General: Normal range of motion.     Cervical back: Normal range of motion.  Skin:    General: Skin is warm.  Neurological:     General: No focal deficit present.     Mental Status: She is alert and oriented to person, place, and time.  Psychiatric:        Behavior: Behavior normal.        Judgment: Judgment normal.      LABORATORY DATA:  I have reviewed the data as listed Lab Results  Component Value Date   WBC 7.0 11/21/2021   HGB 12.4 11/21/2021   HCT 38.5 11/21/2021   MCV 91.2 11/21/2021   PLT 228 11/21/2021   Recent Labs    05/22/21 0900 11/21/21 0905  NA 137 140  K 3.7 3.8  CL 106 107  CO2 23 27  GLUCOSE 157* 166*  BUN 15 19  CREATININE 1.04* 1.12*  CALCIUM 9.0 9.5  GFRNONAA >60 56*  PROT 7.0 7.2  ALBUMIN 3.8 4.0  AST 21 23  ALT 14 15  ALKPHOS 69 82  BILITOT 0.6 0.6     RADIOGRAPHIC STUDIES: I have personally reviewed the radiological images as listed and agreed with the findings in the report. No results found.  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 Ladene Artist q 26M.  Clinically no evidence of recurrence; see discussion below.  # Left jaw pain- [3 months] s/p Eval with Dr.Juengle. ? TMJ; CT scan- NED s/p muscle relxaant- s/p orthodontist- STABLE.    #Normocytic anemia-with hemoglobin normal at 12.3-  [ EGD colonoscopy-February 2022 [Dr.Tahiliani]- STABLE   # Elevated BG- PBF- BG 166- ? Prediabetes- recommend follow up with PCP.   #  history of B12 deficiency-continue B12 injections monthly x6- .  # Mediport in place-stable flush every 2 to 3 months  # Port/IV access- Stable; discussed re: pro and cons of keeping the port vs. Explantation.  After lengthy discussion patient plans to have the port taken out. Will make an appointment with IR.  # DISPOSITION:  # port explanttaion refer to IR # B12 injection today;  # B12 injections monthly x6  # 3 months- port flush # follow up in 6 months- MD: labs- cbc/cmp/TSH; B12 levels; B12 injection; - Dr.B  All questions were answered. The patient knows to call the clinic with any problems, questions or concerns.    Cammie Sickle, MD 11/21/2021 10:58 AM

## 2021-11-21 NOTE — Progress Notes (Signed)
Patient reports increased fatigue.  New right knee pain (7/10 pain today) for the past 3 months that has been evaluated by PCP with no improvement.

## 2021-11-22 ENCOUNTER — Other Ambulatory Visit: Payer: Self-pay

## 2021-11-23 ENCOUNTER — Telehealth: Payer: Self-pay | Admitting: *Deleted

## 2021-11-23 ENCOUNTER — Other Ambulatory Visit: Payer: Self-pay | Admitting: *Deleted

## 2021-11-23 DIAGNOSIS — Z95828 Presence of other vascular implants and grafts: Secondary | ICD-10-CM

## 2021-11-23 NOTE — Telephone Encounter (Signed)
Call placed to patient to review appointment details for port removal in IR. Appointment scheduled for Wed. 11/1 at 1:30 pm, patient to arrive in medical mall at 12:30 pm. Patient in agreement with appointment and verbalized understanding of appointment details.

## 2021-11-25 ENCOUNTER — Other Ambulatory Visit: Payer: Self-pay

## 2021-12-04 ENCOUNTER — Other Ambulatory Visit: Payer: Self-pay | Admitting: Radiology

## 2021-12-04 DIAGNOSIS — C01 Malignant neoplasm of base of tongue: Secondary | ICD-10-CM

## 2021-12-04 NOTE — Progress Notes (Signed)
Patient for IR Port Removal on Wed 12/05/21, I called and spoke with the patient on the phone and gave pre-procedure instructions. Pt was made aware to be here at 12:30p at the new entrance, NPO after MN prior to procedure as well as driver post procedure/recovery/discharge. Pt stated understanding. Called 12/04/21

## 2021-12-05 ENCOUNTER — Ambulatory Visit
Admission: RE | Admit: 2021-12-05 | Discharge: 2021-12-05 | Disposition: A | Discharge status: Home / Self Care | Payer: Medicare Other | Source: Ambulatory Visit | Attending: Internal Medicine | Admitting: Internal Medicine

## 2021-12-05 ENCOUNTER — Other Ambulatory Visit: Payer: Self-pay

## 2021-12-05 ENCOUNTER — Encounter: Payer: Self-pay | Admitting: Radiology

## 2021-12-05 DIAGNOSIS — Z85810 Personal history of malignant neoplasm of tongue: Secondary | ICD-10-CM | POA: Diagnosis not present

## 2021-12-05 DIAGNOSIS — C01 Malignant neoplasm of base of tongue: Secondary | ICD-10-CM

## 2021-12-05 DIAGNOSIS — Z9221 Personal history of antineoplastic chemotherapy: Secondary | ICD-10-CM | POA: Diagnosis not present

## 2021-12-05 DIAGNOSIS — Z452 Encounter for adjustment and management of vascular access device: Secondary | ICD-10-CM | POA: Diagnosis not present

## 2021-12-05 DIAGNOSIS — F32A Depression, unspecified: Secondary | ICD-10-CM | POA: Diagnosis not present

## 2021-12-05 DIAGNOSIS — F419 Anxiety disorder, unspecified: Secondary | ICD-10-CM | POA: Insufficient documentation

## 2021-12-05 DIAGNOSIS — K219 Gastro-esophageal reflux disease without esophagitis: Secondary | ICD-10-CM | POA: Diagnosis not present

## 2021-12-05 DIAGNOSIS — Z923 Personal history of irradiation: Secondary | ICD-10-CM | POA: Insufficient documentation

## 2021-12-05 DIAGNOSIS — Z95828 Presence of other vascular implants and grafts: Secondary | ICD-10-CM

## 2021-12-05 HISTORY — PX: IR REMOVAL TUN ACCESS W/ PORT W/O FL MOD SED: IMG2290

## 2021-12-05 MED ORDER — MIDAZOLAM HCL 2 MG/2ML IJ SOLN
INTRAMUSCULAR | Status: AC
  Filled 2021-12-05: qty 2

## 2021-12-05 MED ORDER — MIDAZOLAM HCL 2 MG/2ML IJ SOLN
INTRAMUSCULAR | Status: AC | PRN
  Administered 2021-12-05: 1 mg via INTRAVENOUS

## 2021-12-05 MED ORDER — LIDOCAINE-EPINEPHRINE 1 %-1:100000 IJ SOLN
INTRAMUSCULAR | Status: AC
  Administered 2021-12-05: 6 mL
  Filled 2021-12-05: qty 1

## 2021-12-05 MED ORDER — FENTANYL CITRATE (PF) 100 MCG/2ML IJ SOLN
INTRAMUSCULAR | Status: AC
  Filled 2021-12-05: qty 2

## 2021-12-05 MED ORDER — SODIUM CHLORIDE 0.9 % IV SOLN: INTRAVENOUS | Status: DC

## 2021-12-05 MED ORDER — FENTANYL CITRATE (PF) 100 MCG/2ML IJ SOLN
INTRAMUSCULAR | Status: AC | PRN
  Administered 2021-12-05: 50 ug via INTRAVENOUS

## 2021-12-05 NOTE — H&P (Signed)
Chief Complaint: Patient was seen in consultation today for port removal  Referring Physician(s): Cammie Sickle  Supervising Physician: Juliet Rude  Patient Status: [facility] - Out-pt  History of Present Illness: Connie Cameron is a 61 y.o. female with PMH significant for anxiety, depression, GERD, throat cancer, and tongue cancer, with prior port placement on 03/23/21 by Dr Delana Meyer with the Vascular Service. 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 A request has been received for port removal by IR.  Past Medical History:  Diagnosis Date   Anxiety    Depression    GERD (gastroesophageal reflux disease)    Throat cancer ([place]) 2021   Tongue cancer [facility])     Past Surgical History:  Procedure Laterality Date   COLONOSCOPY WITH PROPOFOL N/A 03/30/2020   Procedure: COLONOSCOPY WITH PROPOFOL;  Surgeon: Virgel Manifold, MD;  Location: [facility] ENDOSCOPY;  Service: Endoscopy;  Laterality: N/A;  COVID POSITIVE 02/29/2020   ESOPHAGOGASTRODUODENOSCOPY (EGD) WITH PROPOFOL N/A 03/30/2020   Procedure: ESOPHAGOGASTRODUODENOSCOPY (EGD) WITH PROPOFOL;  Surgeon: Virgel Manifold, MD;  Location: [facility] ENDOSCOPY;  Service: Endoscopy;  Laterality: N/A;   ESOPHAGOGASTRODUODENOSCOPY (EGD) WITH PROPOFOL N/A 08/03/2020   Procedure: ESOPHAGOGASTRODUODENOSCOPY (EGD) WITH PROPOFOL;  Surgeon: Virgel Manifold, MD;  Location: [facility] ENDOSCOPY;  Service: Endoscopy;  Laterality: N/A;   ESOPHAGOGASTRODUODENOSCOPY (EGD) WITH PROPOFOL N/A 01/25/2021   Procedure: ESOPHAGOGASTRODUODENOSCOPY (EGD) WITH PROPOFOL;  Surgeon: Virgel Manifold, MD;  Location: [facility] ENDOSCOPY;  Service: Endoscopy;  Laterality: N/A;   ESOPHAGOGASTRODUODENOSCOPY (EGD) WITH PROPOFOL N/A 09/05/2021   Procedure: ESOPHAGOGASTRODUODENOSCOPY (EGD) WITH PROPOFOL;  Surgeon: Lin Landsman, MD;  Location: [facility] ENDOSCOPY;  Service: Gastroenterology;   Laterality: N/A;   EXCISION MASS NECK Left 01/18/2019   Procedure: EXCISION MASS NECK/NODE;  Surgeon: Margaretha Sheffield, MD;  Location: [facility] ORS;  Service: ENT;  Laterality: Left;   LARYNGOSCOPY Bilateral 02/22/2019   Procedure: MICROSCOPIC DIRECT LARYNGOSCOPY AND BIOPSY;  Surgeon: Margaretha Sheffield, MD;  Location: [facility] ORS;  Service: ENT;  Laterality: Bilateral;   PORTA CATH INSERTION N/A 03/24/2019   Procedure: PORTA CATH INSERTION;  Surgeon: Katha Cabal, MD;  Location: [place] CV LAB;  Service: Cardiovascular;  Laterality: N/A;   TONSILLECTOMY     TUBAL LIGATION      Allergies: Chlorhexidine  Medications: Prior to Admission medications   Medication Sig Start Date End Date Taking? Authorizing Provider  atorvastatin (LIPITOR) 20 MG tablet Take 20 mg by mouth daily. 08/20/21   [provider]  baclofen (LIORESAL) 10 MG tablet Take 10 mg by mouth at bedtime as needed. 11/11/21   [provider]  gabapentin (NEURONTIN) 300 MG capsule Take 300 mg by mouth 4 (four) times daily. 09/27/20   [provider]  hydrOXYzine (ATARAX/VISTARIL) 25 MG tablet Take 25 mg by mouth daily as needed for nausea, vomiting or anxiety. 06/15/20   [provider]  meclizine (ANTIVERT) 12.5 MG tablet Take 1 tablet (12.5 mg total) by mouth 3 (three) times daily as needed for dizziness or nausea. 06/30/20   Dwyane Dee, MD  naproxen (NAPROSYN) 500 MG tablet Take 1 tablet (500 mg total) by mouth 2 (two) times daily with a meal. Patient not taking: Reported on 09/05/2021 12/25/20   Carrie Mew, MD  omeprazole (PRILOSEC) 40 MG capsule Take 1 capsule (40 mg total) by mouth 2 (two) times daily before a meal. 09/05/21 12/04/21  Lin Landsman, MD  ondansetron (ZOFRAN ODT) 4  MG disintegrating tablet Take 1 tablet (4 mg total) by mouth every 6 (six) hours as needed for nausea or vomiting. Patient not taking: Reported on 11/21/2021 06/26/20   Ward, Delice Bison, DO  sertraline (ZOLOFT)  100 MG tablet Take 100 mg by mouth daily. 06/04/21   [provider]  traZODone (DESYREL) 100 MG tablet Take 100 mg by mouth daily. Patient not taking: Reported on 11/21/2021 06/04/21   [provider]  Vitamin D, Ergocalciferol, (DRISDOL) 1.25 MG (50000 UNIT) CAPS capsule Take 50,000 Units by mouth once a week. 06/16/20   [provider]     Family History  Problem Relation Age of Onset   Breast cancer Sister    Brain cancer Paternal Grandfather     Social History   Socioeconomic History   Marital status: Single    Spouse name: Not on file   Number of children: Not on file   Years of education: Not on file   Highest education level: Not on file  Occupational History   Not on file  Tobacco Use   Smoking status: Never   Smokeless tobacco: Never  Vaping Use   Vaping Use: Never used  Substance and Sexual Activity   Alcohol use: Not Currently   Drug use: No   Sexual activity: Not on file  Other Topics Concern   Not on file  Social History Narrative   Not on file   Social Determinants of Health   Financial Resource Strain: Not on file  Food Insecurity: Not on file  Transportation Needs: Not on file  Physical Activity: Not on file  Stress: Not on file  Social Connections: Not on file    Review of Systems: A 12 point ROS discussed and pertinent positives are indicated in the HPI above.  All other systems are negative.  Review of Systems  Constitutional:  Negative for chills and fever.  Respiratory:  Negative for chest tightness and shortness of breath.   Cardiovascular:  Negative for chest pain and leg swelling.  Gastrointestinal:  Negative for abdominal pain, diarrhea, nausea and vomiting.  Neurological:  Negative for dizziness and headaches.  Psychiatric/Behavioral:  Negative for confusion.     Vital Signs: There were no vitals taken for this visit.    Physical Exam Constitutional:      General: She is not in acute distress.     Appearance: Normal appearance. She is not ill-appearing.  HENT:     Mouth/Throat:     Mouth: Mucous membranes are moist.  Cardiovascular:     Rate and Rhythm: Normal rate and regular rhythm.     Pulses: Normal pulses.     Heart sounds: Normal heart sounds.  Pulmonary:     Effort: Pulmonary effort is normal.     Breath sounds: Normal breath sounds.  Abdominal:     General: Bowel sounds are normal.     Palpations: Abdomen is soft.     Tenderness: There is no abdominal tenderness.  Musculoskeletal:     Right lower leg: No edema.     Left lower leg: No edema.  Skin:    General: Skin is warm and dry.  Neurological:     Mental Status: She is alert and oriented to person, place, and time.  Psychiatric:        Mood and Affect: Mood normal.        Behavior: Behavior normal.     Imaging: No results found.  Labs:  CBC: Recent Labs  05/22/21 0900 11/21/21 0905  WBC 5.7 7.0  HGB 12.3 12.4  HCT 37.5 38.5  PLT 214 228    COAGS: No results for input(s): "INR", "APTT" in the last 8760 hours.  BMP: Recent Labs    05/22/21 0900 11/21/21 0905  NA 137 140  K 3.7 3.8  CL 106 107  CO2 23 27  GLUCOSE 157* 166*  BUN 15 19  CALCIUM 9.0 9.5  CREATININE 1.04* 1.12*  GFRNONAA >60 56*    LIVER FUNCTION TESTS: Recent Labs    05/22/21 0900 11/21/21 0905  BILITOT 0.6 0.6  AST 21 23  ALT 14 15  ALKPHOS 69 82  PROT 7.0 7.2  ALBUMIN 3.8 4.0    TUMOR MARKERS: No results for input(s): "AFPTM", "CEA", "CA199", "CHROMGRNA" in the last 8760 hours.  Assessment and Plan:  Connie Cameron is a 61 yo female being seen today for port removal following completion of treatment for squamous cell carcinoma of the tongue. Case is set to proceed with Dr Denna Haggard on 12/05/21  Risks and benefits of image guided port-a-catheter removal was discussed with the patient including, but not limited to bleeding, infection, and damage to blood vessel.  All of the patient's questions were answered,  patient is agreeable to proceed. Consent signed and in chart.   Thank you for this interesting consult.  I greatly enjoyed meeting Connie Cameron and look forward to participating in their care.  A copy of this report was sent to the requesting provider on this date.  Electronically Signed: Lura Em, PA-C 12/05/2021, 12:59 PM   I spent a total of  30 Minutes   in face to face in clinical consultation, greater than 50% of which was counseling/coordinating care for port removal.

## 2021-12-05 NOTE — Sedation Documentation (Signed)
Port on Right side of chest ( to be removed).

## 2021-12-07 ENCOUNTER — Other Ambulatory Visit: Payer: Self-pay

## 2021-12-24 ENCOUNTER — Inpatient Hospital Stay: Payer: Medicare Other | Attending: Internal Medicine

## 2021-12-24 DIAGNOSIS — T451X5A Adverse effect of antineoplastic and immunosuppressive drugs, initial encounter: Secondary | ICD-10-CM

## 2021-12-24 DIAGNOSIS — D519 Vitamin B12 deficiency anemia, unspecified: Secondary | ICD-10-CM | POA: Diagnosis present

## 2021-12-24 DIAGNOSIS — Z79899 Other long term (current) drug therapy: Secondary | ICD-10-CM | POA: Diagnosis not present

## 2021-12-24 MED ORDER — CYANOCOBALAMIN 1000 MCG/ML IJ SOLN
1000.0000 ug | Freq: Once | INTRAMUSCULAR | Status: AC
  Administered 2021-12-24: 1000 ug via INTRAMUSCULAR
  Filled 2021-12-24: qty 1

## 2022-01-09 DIAGNOSIS — S83209A Unspecified tear of unspecified meniscus, current injury, unspecified knee, initial encounter: Secondary | ICD-10-CM | POA: Insufficient documentation

## 2022-01-21 ENCOUNTER — Inpatient Hospital Stay: Payer: Medicare Other | Attending: Internal Medicine

## 2022-01-23 ENCOUNTER — Inpatient Hospital Stay
Admission: EM | Admit: 2022-01-23 | Discharge: 2022-01-25 | DRG: 322 | Disposition: A | Discharge status: Home / Self Care | Payer: Medicare Other | Attending: Family Medicine | Admitting: Family Medicine

## 2022-01-23 ENCOUNTER — Other Ambulatory Visit: Payer: Self-pay

## 2022-01-23 ENCOUNTER — Encounter: Payer: Self-pay | Admitting: Hematology and Oncology

## 2022-01-23 ENCOUNTER — Inpatient Hospital Stay (HOSPITAL_COMMUNITY)
Admit: 2022-01-23 | Discharge: 2022-01-23 | Disposition: A | Discharge status: Home / Self Care | Payer: Medicare Other | Attending: Internal Medicine | Admitting: Internal Medicine

## 2022-01-23 ENCOUNTER — Encounter: Payer: Self-pay | Admitting: Oncology

## 2022-01-23 ENCOUNTER — Other Ambulatory Visit (HOSPITAL_COMMUNITY): Payer: Self-pay

## 2022-01-23 ENCOUNTER — Encounter
Admission: EM | Disposition: A | Discharge status: Home / Self Care | Payer: Self-pay | Source: Home / Self Care | Attending: Family Medicine

## 2022-01-23 DIAGNOSIS — F419 Anxiety disorder, unspecified: Secondary | ICD-10-CM | POA: Diagnosis present

## 2022-01-23 DIAGNOSIS — K219 Gastro-esophageal reflux disease without esophagitis: Secondary | ICD-10-CM | POA: Diagnosis present

## 2022-01-23 DIAGNOSIS — Z803 Family history of malignant neoplasm of breast: Secondary | ICD-10-CM

## 2022-01-23 DIAGNOSIS — I4729 Other ventricular tachycardia: Secondary | ICD-10-CM | POA: Diagnosis not present

## 2022-01-23 DIAGNOSIS — I2510 Atherosclerotic heart disease of native coronary artery without angina pectoris: Secondary | ICD-10-CM | POA: Diagnosis present

## 2022-01-23 DIAGNOSIS — I213 ST elevation (STEMI) myocardial infarction of unspecified site: Secondary | ICD-10-CM | POA: Diagnosis not present

## 2022-01-23 DIAGNOSIS — I472 Ventricular tachycardia, unspecified: Secondary | ICD-10-CM | POA: Diagnosis not present

## 2022-01-23 DIAGNOSIS — Z79899 Other long term (current) drug therapy: Secondary | ICD-10-CM | POA: Diagnosis not present

## 2022-01-23 DIAGNOSIS — I2111 ST elevation (STEMI) myocardial infarction involving right coronary artery: Secondary | ICD-10-CM | POA: Diagnosis present

## 2022-01-23 DIAGNOSIS — R079 Chest pain, unspecified: Secondary | ICD-10-CM | POA: Diagnosis present

## 2022-01-23 DIAGNOSIS — Z808 Family history of malignant neoplasm of other organs or systems: Secondary | ICD-10-CM

## 2022-01-23 DIAGNOSIS — Z85810 Personal history of malignant neoplasm of tongue: Secondary | ICD-10-CM | POA: Diagnosis not present

## 2022-01-23 DIAGNOSIS — Z1152 Encounter for screening for COVID-19: Secondary | ICD-10-CM | POA: Diagnosis not present

## 2022-01-23 DIAGNOSIS — Z8249 Family history of ischemic heart disease and other diseases of the circulatory system: Secondary | ICD-10-CM

## 2022-01-23 DIAGNOSIS — Z888 Allergy status to other drugs, medicaments and biological substances status: Secondary | ICD-10-CM | POA: Diagnosis not present

## 2022-01-23 DIAGNOSIS — E782 Mixed hyperlipidemia: Secondary | ICD-10-CM | POA: Diagnosis present

## 2022-01-23 DIAGNOSIS — F32A Depression, unspecified: Secondary | ICD-10-CM | POA: Diagnosis present

## 2022-01-23 DIAGNOSIS — F418 Other specified anxiety disorders: Secondary | ICD-10-CM | POA: Diagnosis present

## 2022-01-23 HISTORY — PX: CORONARY/GRAFT ACUTE MI REVASCULARIZATION: CATH118305

## 2022-01-23 HISTORY — PX: LEFT HEART CATH AND CORONARY ANGIOGRAPHY: CATH118249

## 2022-01-23 LAB — ECHOCARDIOGRAM COMPLETE
AR max vel: 1.7 cm2
AV Area VTI: 1.94 cm2
AV Area mean vel: 1.58 cm2
AV Mean grad: 4 mmHg
AV Peak grad: 7.1 mmHg
Ao pk vel: 1.33 m/s
Area-P 1/2: 2.52 cm2
Height: 62 in
S' Lateral: 2.3 cm
Weight: 2627.88 oz

## 2022-01-23 LAB — CBC WITH DIFFERENTIAL/PLATELET
Abs Immature Granulocytes: 0.12 10*3/uL — ABNORMAL HIGH (ref 0.00–0.07)
Basophils Absolute: 0 10*3/uL (ref 0.0–0.1)
Basophils Relative: 0 %
Eosinophils Absolute: 0.1 10*3/uL (ref 0.0–0.5)
Eosinophils Relative: 1 %
HCT: 41.2 % (ref 36.0–46.0)
Hemoglobin: 13.2 g/dL (ref 12.0–15.0)
Immature Granulocytes: 2 %
Lymphocytes Relative: 25 %
Lymphs Abs: 2 10*3/uL (ref 0.7–4.0)
MCH: 28.8 pg (ref 26.0–34.0)
MCHC: 32 g/dL (ref 30.0–36.0)
MCV: 89.8 fL (ref 80.0–100.0)
Monocytes Absolute: 0.7 10*3/uL (ref 0.1–1.0)
Monocytes Relative: 8 %
Neutro Abs: 5.1 10*3/uL (ref 1.7–7.7)
Neutrophils Relative %: 64 %
Platelets: 372 10*3/uL (ref 150–400)
RBC: 4.59 MIL/uL (ref 3.87–5.11)
RDW: 13.8 % (ref 11.5–15.5)
WBC: 8 10*3/uL (ref 4.0–10.5)
nRBC: 0 % (ref 0.0–0.2)

## 2022-01-23 LAB — LIPID PANEL
Cholesterol: 258 mg/dL — ABNORMAL HIGH (ref 0–200)
HDL: 44 mg/dL (ref >40)
LDL Cholesterol: 161 mg/dL — ABNORMAL HIGH (ref 0–99)
Total CHOL/HDL Ratio: 5.9 RATIO
Triglycerides: 267 mg/dL — ABNORMAL HIGH (ref <150)
VLDL: 53 mg/dL — ABNORMAL HIGH (ref 0–40)

## 2022-01-23 LAB — TROPONIN I (HIGH SENSITIVITY)
Troponin I (High Sensitivity): 9 ng/L (ref <18)
Troponin I (High Sensitivity): 3204 ng/L (ref <18)

## 2022-01-23 LAB — COMPREHENSIVE METABOLIC PANEL
ALT: 14 U/L (ref 0–44)
AST: 19 U/L (ref 15–41)
Albumin: 4 g/dL (ref 3.5–5.0)
Alkaline Phosphatase: 70 U/L (ref 38–126)
Anion gap: 5 (ref 5–15)
BUN: 20 mg/dL (ref 8–23)
CO2: 24 mmol/L (ref 22–32)
Calcium: 9 mg/dL (ref 8.9–10.3)
Chloride: 111 mmol/L (ref 98–111)
Creatinine, Ser: 0.85 mg/dL (ref 0.44–1.00)
GFR, Estimated: >60 mL/min (ref >60)
Glucose, Bld: 177 mg/dL — ABNORMAL HIGH (ref 70–99)
Potassium: 3.7 mmol/L (ref 3.5–5.1)
Sodium: 140 mmol/L (ref 135–145)
Total Bilirubin: 0.8 mg/dL (ref 0.3–1.2)
Total Protein: 6.9 g/dL (ref 6.5–8.1)

## 2022-01-23 LAB — MRSA NEXT GEN BY PCR, NASAL: MRSA by PCR Next Gen: NOT DETECTED

## 2022-01-23 LAB — PROTIME-INR
INR: 1.1 (ref 0.8–1.2)
Prothrombin Time: 13.8 seconds (ref 11.4–15.2)

## 2022-01-23 LAB — HEMOGLOBIN A1C
Hgb A1c MFr Bld: 6.2 % — ABNORMAL HIGH (ref 4.8–5.6)
Mean Plasma Glucose: 131 mg/dL

## 2022-01-23 LAB — GLUCOSE, CAPILLARY: Glucose-Capillary: 126 mg/dL — ABNORMAL HIGH (ref 70–99)

## 2022-01-23 LAB — POCT ACTIVATED CLOTTING TIME: Activated Clotting Time: 244 seconds

## 2022-01-23 LAB — RESP PANEL BY RT-PCR (RSV, FLU A&B, COVID)  RVPGX2
Influenza A by PCR: NEGATIVE
Influenza B by PCR: NEGATIVE
Resp Syncytial Virus by PCR: NEGATIVE
SARS Coronavirus 2 by RT PCR: NEGATIVE

## 2022-01-23 LAB — APTT: aPTT: 26 seconds (ref 24–36)

## 2022-01-23 SURGERY — CORONARY/GRAFT ACUTE MI REVASCULARIZATION
Anesthesia: Moderate Sedation

## 2022-01-23 MED ORDER — NITROGLYCERIN 1 MG/10 ML FOR IR/CATH LAB
INTRA_ARTERIAL | Status: DC | PRN
  Administered 2022-01-23 (×3): 200 ug via INTRACORONARY

## 2022-01-23 MED ORDER — SODIUM CHLORIDE 0.9 % IV SOLN: INTRAVENOUS | Status: DC

## 2022-01-23 MED ORDER — SODIUM CHLORIDE 0.9 % IV SOLN: INTRAVENOUS | Status: AC

## 2022-01-23 MED ORDER — LABETALOL HCL 5 MG/ML IV SOLN: 10.0000 mg | INTRAVENOUS | Status: AC | PRN

## 2022-01-23 MED ORDER — ACETAMINOPHEN 325 MG PO TABS
650.0000 mg | ORAL_TABLET | ORAL | Status: DC | PRN
  Administered 2022-01-23 – 2022-01-24 (×2): 650 mg via ORAL
  Filled 2022-01-23 (×2): qty 2

## 2022-01-23 MED ORDER — MIDAZOLAM HCL 2 MG/2ML IJ SOLN
INTRAMUSCULAR | Status: AC
  Filled 2022-01-23: qty 2

## 2022-01-23 MED ORDER — ASPIRIN 81 MG PO CHEW
81.0000 mg | CHEWABLE_TABLET | Freq: Every day | ORAL | Status: DC
  Administered 2022-01-24 – 2022-01-25 (×2): 81 mg via ORAL
  Filled 2022-01-23 (×2): qty 1

## 2022-01-23 MED ORDER — VERAPAMIL HCL 2.5 MG/ML IV SOLN
INTRAVENOUS | Status: DC | PRN
  Administered 2022-01-23 (×2): 2.5 mg via INTRA_ARTERIAL

## 2022-01-23 MED ORDER — ENOXAPARIN SODIUM 40 MG/0.4ML IJ SOSY
40.0000 mg | PREFILLED_SYRINGE | INTRAMUSCULAR | Status: DC
  Administered 2022-01-24 – 2022-01-25 (×2): 40 mg via SUBCUTANEOUS
  Filled 2022-01-23 (×2): qty 0.4

## 2022-01-23 MED ORDER — FENTANYL CITRATE (PF) 100 MCG/2ML IJ SOLN
INTRAMUSCULAR | Status: AC
  Filled 2022-01-23: qty 2

## 2022-01-23 MED ORDER — VERAPAMIL HCL 2.5 MG/ML IV SOLN
INTRAVENOUS | Status: AC
  Filled 2022-01-23: qty 2

## 2022-01-23 MED ORDER — TICAGRELOR 90 MG PO TABS
90.0000 mg | ORAL_TABLET | Freq: Two times a day (BID) | ORAL | Status: DC
  Administered 2022-01-23 – 2022-01-25 (×4): 90 mg via ORAL
  Filled 2022-01-23 (×4): qty 1

## 2022-01-23 MED ORDER — HEPARIN SODIUM (PORCINE) 1000 UNIT/ML IJ SOLN
INTRAMUSCULAR | Status: AC
  Filled 2022-01-23: qty 10

## 2022-01-23 MED ORDER — MIDAZOLAM HCL 2 MG/2ML IJ SOLN
INTRAMUSCULAR | Status: DC | PRN
  Administered 2022-01-23 (×2): 1 mg via INTRAVENOUS

## 2022-01-23 MED ORDER — SERTRALINE HCL 100 MG PO TABS
100.0000 mg | ORAL_TABLET | Freq: Every day | ORAL | Status: DC
  Administered 2022-01-23 – 2022-01-25 (×3): 100 mg via ORAL
  Filled 2022-01-23 (×3): qty 1

## 2022-01-23 MED ORDER — SODIUM CHLORIDE 0.9 % IV SOLN: 250.0000 mL | INTRAVENOUS | Status: DC | PRN

## 2022-01-23 MED ORDER — HEPARIN SODIUM (PORCINE) 5000 UNIT/ML IJ SOLN
4000.0000 [IU] | Freq: Once | INTRAMUSCULAR | Status: AC
  Administered 2022-01-23: 4000 [IU] via INTRAVENOUS

## 2022-01-23 MED ORDER — TICAGRELOR 90 MG PO TABS
ORAL_TABLET | ORAL | Status: AC
  Filled 2022-01-23: qty 2

## 2022-01-23 MED ORDER — HYDRALAZINE HCL 20 MG/ML IJ SOLN: 10.0000 mg | INTRAMUSCULAR | Status: AC | PRN

## 2022-01-23 MED ORDER — SODIUM CHLORIDE 0.9% FLUSH: 3.0000 mL | INTRAVENOUS | Status: DC | PRN

## 2022-01-23 MED ORDER — HEPARIN (PORCINE) IN NACL 1000-0.9 UT/500ML-% IV SOLN
INTRAVENOUS | Status: DC | PRN
  Administered 2022-01-23: 1000 mL

## 2022-01-23 MED ORDER — PANTOPRAZOLE SODIUM 40 MG PO TBEC
40.0000 mg | DELAYED_RELEASE_TABLET | Freq: Every day | ORAL | Status: DC
  Administered 2022-01-23 – 2022-01-25 (×3): 40 mg via ORAL
  Filled 2022-01-23 (×3): qty 1

## 2022-01-23 MED ORDER — FENTANYL CITRATE (PF) 100 MCG/2ML IJ SOLN
INTRAMUSCULAR | Status: DC | PRN
  Administered 2022-01-23 (×2): 25 ug via INTRAVENOUS

## 2022-01-23 MED ORDER — IOHEXOL 300 MG/ML  SOLN
INTRAMUSCULAR | Status: DC | PRN
  Administered 2022-01-23: 86 mL

## 2022-01-23 MED ORDER — ATORVASTATIN CALCIUM 80 MG PO TABS
80.0000 mg | ORAL_TABLET | Freq: Every day | ORAL | Status: DC
  Administered 2022-01-23 – 2022-01-25 (×3): 80 mg via ORAL
  Filled 2022-01-23 (×3): qty 1

## 2022-01-23 MED ORDER — HEPARIN SODIUM (PORCINE) 1000 UNIT/ML IJ SOLN
INTRAMUSCULAR | Status: DC | PRN
  Administered 2022-01-23 (×2): 2000 [IU] via INTRAVENOUS
  Administered 2022-01-23: 3000 [IU] via INTRAVENOUS
  Administered 2022-01-23: 4000 [IU] via INTRAVENOUS

## 2022-01-23 MED ORDER — NITROGLYCERIN 1 MG/10 ML FOR IR/CATH LAB
INTRA_ARTERIAL | Status: AC
  Filled 2022-01-23: qty 10

## 2022-01-23 MED ORDER — TICAGRELOR 90 MG PO TABS
ORAL_TABLET | ORAL | Status: DC | PRN
  Administered 2022-01-23: 180 mg via ORAL

## 2022-01-23 MED ORDER — ASPIRIN 81 MG PO CHEW: 324.0000 mg | CHEWABLE_TABLET | Freq: Once | ORAL | Status: DC

## 2022-01-23 MED ORDER — PERFLUTREN LIPID MICROSPHERE
1.0000 mL | INTRAVENOUS | Status: AC | PRN
  Administered 2022-01-23: 3 mL via INTRAVENOUS

## 2022-01-23 MED ORDER — ATROPINE SULFATE 1 MG/10ML IJ SOSY
PREFILLED_SYRINGE | INTRAMUSCULAR | Status: AC
  Filled 2022-01-23: qty 10

## 2022-01-23 MED ORDER — SODIUM CHLORIDE 0.9% FLUSH
3.0000 mL | Freq: Two times a day (BID) | INTRAVENOUS | Status: DC
  Administered 2022-01-23 – 2022-01-25 (×5): 3 mL via INTRAVENOUS

## 2022-01-23 MED ORDER — MECLIZINE HCL 25 MG PO TABS
12.5000 mg | ORAL_TABLET | Freq: Three times a day (TID) | ORAL | Status: DC | PRN
  Administered 2022-01-23 – 2022-01-24 (×2): 12.5 mg via ORAL
  Filled 2022-01-23 (×3): qty 0.5

## 2022-01-23 MED ORDER — ONDANSETRON HCL 4 MG/2ML IJ SOLN: 4.0000 mg | Freq: Four times a day (QID) | INTRAMUSCULAR | Status: DC | PRN

## 2022-01-23 SURGICAL SUPPLY — 19 items
BALLN MINITREK RX 2.0X12 (BALLOONS) ×1
BALLN ~~LOC~~ TREK NEO RX 3.25X12 (BALLOONS) IMPLANT
BALLN ~~LOC~~ TREK NEO RX 3.25X8 (BALLOONS) IMPLANT
BALLOON MINITREK RX 2.0X12 (BALLOONS) IMPLANT
CATH INFINITI 5 FR JL3.5 (CATHETERS) IMPLANT
CATH LAUNCHER 6FR JR4 (CATHETERS) IMPLANT
DEVICE RAD TR BAND REGULAR (VASCULAR PRODUCTS) IMPLANT
DRAPE BRACHIAL (DRAPES) IMPLANT
GLIDESHEATH SLEND SS 6F .021 (SHEATH) IMPLANT
GUIDEWIRE INQWIRE 1.5J.035X260 (WIRE) IMPLANT
INQWIRE 1.5J .035X260CM (WIRE) ×1
KIT ENCORE 26 ADVANTAGE (KITS) IMPLANT
PACK CARDIAC CATH (CUSTOM PROCEDURE TRAY) ×1 IMPLANT
PROTECTION STATION PRESSURIZED (MISCELLANEOUS) ×1
SET ATX SIMPLICITY (MISCELLANEOUS) IMPLANT
STATION PROTECTION PRESSURIZED (MISCELLANEOUS) IMPLANT
STENT ONYX FRONTIER 2.75X18 (Permanent Stent) IMPLANT
STENT ONYX FRONTIER 3.0X12 (Permanent Stent) IMPLANT
WIRE RUNTHROUGH .014X180CM (WIRE) IMPLANT

## 2022-01-23 NOTE — Progress Notes (Signed)
Responded to Code Stemi. Upon arrival patient being cared for by medical staff. No family present, patient taken to Cath Lab.

## 2022-01-23 NOTE — ED Provider Notes (Signed)
[facility] Provider Note    Event Date/Time   First MD Initiated Contact with Patient 01/23/22 [phone]     (approximate)  History   Chief Complaint: Code STEMI (/)  HPI  Connie Cameron is a 61 y.o. female with a past medical history anxiety, oral cancer now in remission, gastric reflux, presents emergency department for chest pain.  According to the patient around 530 this morning she developed chest pain pressure 7/10 in severity along with nausea 1 episode of vomiting and diaphoresis.  Patient called EMS.  EMS obtained an EKG and route to the hospital that was concerning for an inferior STEMI.  Patient denies any history of cardiac disease, states only family history is a grandmother.  Patient denies smoking and alcohol.  Patient received 324 mg of aspirin by EMS as well as nitroglycerin x 2 and 50 mcg of fentanyl currently states chest pain is a 5/10.  Physical Exam   Triage Vital Signs: ED Triage Vitals [01/23/22 0647]  Enc Vitals Group     BP      Pulse      Resp      Temp      Temp src      SpO2      Weight 170 lb (77.1 kg)     Height '5\' 2"'$  (1.575 m)     Head Circumference      Peak Flow      Pain Score      Pain Loc      Pain Edu?      Excl. in [place]?     Most recent vital signs: There were no vitals filed for this visit.  General: Awake, no distress.  CV:  Good peripheral perfusion.  Regular rate and rhythm  Resp:  Normal effort.  Equal breath sounds bilaterally.  Abd:  No distention.  Soft, nontender.  No rebound or guarding.    ED Results / Procedures / Treatments   EKG  EKG viewed and interpreted by myself shows a normal sinus rhythm at 53 bpm with a narrow QRS, normal axis, normal intervals.  Patient has ST elevation in leads II, III, aVF with reciprocal depressions in lead I and aVL most consistent with an inferior STEMI.  MEDICATIONS ORDERED IN ED: Medications  0.9 %  sodium chloride infusion (has no administration in time  range)  aspirin chewable tablet 324 mg (has no administration in time range)  heparin injection 60 Units/kg (has no administration in time range)     IMPRESSION / MDM / ASSESSMENT AND PLAN / ED COURSE  I reviewed the triage vital signs and the nursing notes.  Patient's presentation is most consistent with acute presentation with potential threat to life or bodily function.  Patient presents to the emergency department for chest pain starting around 530 this morning associate with nausea 1 episode of vomiting, diaphoresis.  Patient's EKG by EMS consistent with inferior STEMI, code STEMI alert paged.  Here the patient continues to state 5/10 central chest pain.  Patient is EKG in the emergency department is again concerning for inferior STEMI with elevation in leads II, III, aVF reciprocal depressions in leads I and aVL.  We will check labs.  Currently awaiting cardiology arrival, anticipate likely Cath Lab.  CRITICAL CARE Performed by: Harvest Dark   Total critical care time: 30 minutes  Critical care time was exclusive of separately billable procedures and treating other patients.  Critical care was necessary to treat or  prevent imminent or life-threatening deterioration.  Critical care was time spent personally by me on the following activities: development of treatment plan with patient and/or surrogate as well as nursing, discussions with consultants, evaluation of patient's response to treatment, examination of patient, obtaining history from patient or surrogate, ordering and performing treatments and interventions, ordering and review of laboratory studies, ordering and review of radiographic studies, pulse oximetry and re-evaluation of patient's condition.   FINAL CLINICAL IMPRESSION(S) / ED DIAGNOSES   STEMI    Note:  This document was prepared using Dragon voice recognition software and may include unintentional dictation errors.   Harvest Dark, MD 01/23/22  [phone]

## 2022-01-23 NOTE — Consult Note (Signed)
Cardiology Consultation   Patient ID: Connie Cameron MRN: [id number]; DOB: [birth year]  Admit date: 01/23/2022 Date of Consult: 01/23/2022  PCP:  [place] [facility] Cardiologist:  None      Patient Profile:   Connie Cameron is a 61 y.o. female with a hx of head and neck cancer who is being seen 01/23/2022 for the evaluation of chest pain and abnormal EKG at the request of Dr. Kerman Passey.  History of Present Illness:   Connie Cameron was in her usual state of health until ~5 AM, when she awoke with central chest pain and nausea.  Pain was 7/10 in intensity with a pressure-like sensation.  It does not radiate.  She threw up (liquids) but otherwise has not had any associated symptoms.  She had been feeling well yesterday.  She does not have a history of heart problems.  Her roommate called 911; EMS found inferior ST elevation.  She was given aspirin and NTG.  She continues to have 5/10 CP.   Past Medical History:  Diagnosis Date   Anxiety    Depression    GERD (gastroesophageal reflux disease)    Throat cancer ([place]) 2021   Tongue cancer [facility])     Past Surgical History:  Procedure Laterality Date   COLONOSCOPY WITH PROPOFOL N/A 03/30/2020   Procedure: COLONOSCOPY WITH PROPOFOL;  Surgeon: Virgel Manifold, MD;  Location: [facility] ENDOSCOPY;  Service: Endoscopy;  Laterality: N/A;  COVID POSITIVE 02/29/2020   ESOPHAGOGASTRODUODENOSCOPY (EGD) WITH PROPOFOL N/A 03/30/2020   Procedure: ESOPHAGOGASTRODUODENOSCOPY (EGD) WITH PROPOFOL;  Surgeon: Virgel Manifold, MD;  Location: [facility] ENDOSCOPY;  Service: Endoscopy;  Laterality: N/A;   ESOPHAGOGASTRODUODENOSCOPY (EGD) WITH PROPOFOL N/A 08/03/2020   Procedure: ESOPHAGOGASTRODUODENOSCOPY (EGD) WITH PROPOFOL;  Surgeon: Virgel Manifold, MD;  Location: [facility] ENDOSCOPY;  Service: Endoscopy;  Laterality: N/A;   ESOPHAGOGASTRODUODENOSCOPY (EGD) WITH PROPOFOL N/A 01/25/2021   Procedure: ESOPHAGOGASTRODUODENOSCOPY  (EGD) WITH PROPOFOL;  Surgeon: Virgel Manifold, MD;  Location: [facility] ENDOSCOPY;  Service: Endoscopy;  Laterality: N/A;   ESOPHAGOGASTRODUODENOSCOPY (EGD) WITH PROPOFOL N/A 09/05/2021   Procedure: ESOPHAGOGASTRODUODENOSCOPY (EGD) WITH PROPOFOL;  Surgeon: Lin Landsman, MD;  Location: [facility] ENDOSCOPY;  Service: Gastroenterology;  Laterality: N/A;   EXCISION MASS NECK Left 01/18/2019   Procedure: EXCISION MASS NECK/NODE;  Surgeon: Margaretha Sheffield, MD;  Location: [facility] ORS;  Service: ENT;  Laterality: Left;   IR REMOVAL TUN ACCESS W/ PORT W/O FL MOD SED  12/05/2021   LARYNGOSCOPY Bilateral 02/22/2019   Procedure: MICROSCOPIC DIRECT LARYNGOSCOPY AND BIOPSY;  Surgeon: Margaretha Sheffield, MD;  Location: [facility] ORS;  Service: ENT;  Laterality: Bilateral;   PORTA CATH INSERTION N/A 03/24/2019   Procedure: PORTA CATH INSERTION;  Surgeon: Katha Cabal, MD;  Location: [place] CV LAB;  Service: Cardiovascular;  Laterality: N/A;   TONSILLECTOMY     TUBAL LIGATION       Inpatient Medications: Scheduled Meds:  aspirin  324 mg Oral Once   Continuous Infusions:  sodium chloride 20 mL/hr at 01/23/22 0655   PRN Meds:   Allergies:    Allergies  Allergen Reactions   Chlorhexidine     Pt doesn't have anything that requires CHG bath    Social History:   Social History   Tobacco Use   Smoking status: Never   Smokeless tobacco: Never  Vaping Use   Vaping Use: Never used  Substance Use Topics   Alcohol use: Not Currently   Drug use: No  Family History:   Family History  Problem Relation Age of Onset   Breast cancer Sister    Brain cancer Paternal Grandfather      ROS:  Please see the history of present illness. All other ROS reviewed and negative.     Physical Exam/Data:   Vitals:   01/23/22 0647 01/23/22 0649  BP:  (!) 179/93  Pulse:  (!) 55  Resp:  16  Temp:  97.6 F (36.4 C)  TempSrc:  Oral  SpO2:  95%  Weight: 77.1 kg   Height: '5\' 2"'$  (1.575 m)    No intake or  output data in the 24 hours ending 01/23/22 0708    01/23/2022    6:47 AM 12/05/2021    1:13 PM 11/21/2021    9:00 AM  Last 3 Weights  Weight (lbs) 170 lb 168 lb 171 lb 4.8 oz  Weight (kg) 77.111 kg 76.204 kg 77.701 kg     Body mass index is 31.09 kg/m.  General:  Well nourished, well developed, in no acute distress HEENT: normal Neck: no JVD Vascular: No carotid bruits; Distal pulses 2+ bilaterally Cardiac:  Bradycardic but regular without murmus. Lungs:  clear to auscultation bilaterally, no wheezing, rhonchi or rales  Abd: soft, nontender, no hepatomegaly  Ext: no edema Musculoskeletal:  No deformities, BUE and BLE strength normal and equal Skin: warm and dry  Neuro:  CNs 2-12 intact, no focal abnormalities noted Psych:  Normal affect   EKG:  The EKG was personally reviewed and demonstrates:  Sinus bradycardia with inferior ST elevation Telemetry:  Telemetry was personally reviewed and demonstrates:  Sinus rhythm with PVC's.  Relevant CV Studies: None.  Laboratory Data:  High Sensitivity Troponin:  No results for input(s): "TROPONINIHS" in the last 720 hours.   ChemistryNo results for input(s): "NA", "K", "CL", "CO2", "GLUCOSE", "BUN", "CREATININE", "CALCIUM", "MG", "GFRNONAA", "GFRAA", "ANIONGAP" in the last 168 hours.  No results for input(s): "PROT", "ALBUMIN", "AST", "ALT", "ALKPHOS", "BILITOT" in the last 168 hours. Lipids No results for input(s): "CHOL", "TRIG", "HDL", "LABVLDL", "LDLCALC", "CHOLHDL" in the last 168 hours.  HematologyNo results for input(s): "WBC", "RBC", "HGB", "HCT", "MCV", "MCH", "MCHC", "RDW", "PLT" in the last 168 hours. Thyroid No results for input(s): "TSH", "FREET4" in the last 168 hours.  BNPNo results for input(s): "BNP", "PROBNP" in the last 168 hours.  DDimer No results for input(s): "DDIMER" in the last 168 hours.   Radiology/Studies:  No results found.   Assessment and Plan:   Inferior STEMI: Patient with acute onset of CP.  EKG  demonstrates inferior ST elevation consistent with STEMI.  We have discussed the procedure and will move forward with emergent cardiac catheterization and possible PCI.  ASA already given, as well as heparin 4,000 units.  Further recommendations to follow after cath.   For questions or updates, please contact [place] Please consult [email or web address] for contact info under [facility] Cardiology.   Signed, Nelva Bush, MD  01/23/2022 7:08 AM

## 2022-01-23 NOTE — TOC Benefit Eligibility Note (Signed)
Patient Teacher, English as a foreign language completed.    The patient is currently admitted and upon discharge could be taking Brilinta 90 mg.  The current 30 day co-pay is $4.30.   The patient is insured through [place], [place] Patient Advocate Specialist [place] Patient Advocate Team Direct Number: [phone]  Fax: [phone]

## 2022-01-23 NOTE — ED Triage Notes (Signed)
Woke with acute central chest pain 0530. No major cardiac hx per ems report. CODE STEMI called en route. 1 episode n/v and dizziness.   Zofran en route  324 ASA  1 SL Nitro  Pt alert and oriented on arrival. MD met pt in room.

## 2022-01-23 NOTE — ED Notes (Signed)
Patient transported to cath lab with cardiologist and STEMI coordinator. Pt on monitor.

## 2022-01-23 NOTE — ED Notes (Signed)
STEMI coordinator in room.   Cardiologist in room.

## 2022-01-23 NOTE — Assessment & Plan Note (Signed)
Continue PPI ?

## 2022-01-23 NOTE — Assessment & Plan Note (Signed)
Continue sertraline 

## 2022-01-23 NOTE — Assessment & Plan Note (Signed)
Presents to the ER via EMS as a code STEMI, EKG in the field showed inferior wall MI  Patient was taken emergently for left heart cath which showed severe single-vessel coronary artery disease with 99% mid RCA stenosis, likely due to acute plaque rupture, with TIMI-2 flow.  There is also a hazy 50-60% proximal RCA stenosis as well as moderate to severe but not critical left coronary artery disease as detailed below. Normal left ventricular filling pressure (LVEDP 12 mmHg). Successful PCI to proximal and mid RCA stenoses using nonoverlapping Onyx Frontier 3.0 x 12 mm (proximal) and 2.75 x 18 mm (mid) drug-eluting stents with 0% residual stenosis and TIMI-3 flow.  Jailed RV marginal branch arising from the mid RCA demonstrates 70% stenosis pre-PCI, increased to 90% plaque shift.  There is TIMI-2 flow at the end of the procedure through this small branch, which is too small for intervention. Continue dual antiplatelet therapy Continue high intensity statin

## 2022-01-23 NOTE — Progress Notes (Signed)
*  PRELIMINARY RESULTS* Echocardiogram 2D Echocardiogram has been performed.  Connie Cameron 01/23/2022, 4:04 PM

## 2022-01-23 NOTE — ED Notes (Signed)
Pads in place on patient. Full cardiac monitor in place

## 2022-01-23 NOTE — H&P (Signed)
History and Physical    Patient: Connie Cameron XNA:[id number] DOB: [birth year] DOA: 01/23/2022 DOS: the patient was seen and examined on 01/23/2022 PCP: [place]  Patient coming from: Home  Chief Complaint:  Chief Complaint  Patient presents with   Code STEMI        HPI: Connie Cameron is a 61 y.o. female with medical history significant for anxiety, depression, GERD, history of oral cancer now in remission who presents to the ER via EMS for evaluation of midsternal chest pain that woke her up out of her sleep at 5:30 in the morning.  She rated her pain as 7 x 10 in intensity at its worst associated with diaphoresis, nausea, emesis.  She also complained of some dizziness.  She denied having any palpitations or shortness of breath.  She called EMS and when they arrived they got an EKG concerning for an inferior STEMI and so code STEMI was called. Patient has a family history of coronary artery disease but denies a personal history of diabetes, no nicotine dependence, no hypertension. She denies having any urinary symptoms, no fever, no chills, no leg swelling, no headache, no dizziness, no lightheadedness, no blurred vision, no focal deficit, no cough She received aspirin 324 mg, sublingual nitroglycerin and 50 mcg of fentanyl Patient was taken emergently to the Cath Lab for left heart cath   Review of Systems: As mentioned in the history of present illness. All other systems reviewed and are negative. 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[facility])    Past Surgical History:  Procedure Laterality Date   COLONOSCOPY WITH PROPOFOL N/A 03/30/2020   Procedure: COLONOSCOPY WITH PROPOFOL;  Surgeon: Virgel Manifold, MD;  Location: [facility] ENDOSCOPY;  Service: Endoscopy;  Laterality: N/A;  COVID POSITIVE 02/29/2020   ESOPHAGOGASTRODUODENOSCOPY (EGD) WITH PROPOFOL N/A  03/30/2020   Procedure: ESOPHAGOGASTRODUODENOSCOPY (EGD) WITH PROPOFOL;  Surgeon: Virgel Manifold, MD;  Location: [facility] ENDOSCOPY;  Service: Endoscopy;  Laterality: N/A;   ESOPHAGOGASTRODUODENOSCOPY (EGD) WITH PROPOFOL N/A 08/03/2020   Procedure: ESOPHAGOGASTRODUODENOSCOPY (EGD) WITH PROPOFOL;  Surgeon: Virgel Manifold, MD;  Location: [facility] ENDOSCOPY;  Service: Endoscopy;  Laterality: N/A;   ESOPHAGOGASTRODUODENOSCOPY (EGD) WITH PROPOFOL N/A 01/25/2021   Procedure: ESOPHAGOGASTRODUODENOSCOPY (EGD) WITH PROPOFOL;  Surgeon: Virgel Manifold, MD;  Location: [facility] ENDOSCOPY;  Service: Endoscopy;  Laterality: N/A;   ESOPHAGOGASTRODUODENOSCOPY (EGD) WITH PROPOFOL N/A 09/05/2021   Procedure: ESOPHAGOGASTRODUODENOSCOPY (EGD) WITH PROPOFOL;  Surgeon: Lin Landsman, MD;  Location: [facility] ENDOSCOPY;  Service: Gastroenterology;  Laterality: N/A;   EXCISION MASS NECK Left 01/18/2019   Procedure: EXCISION MASS NECK/NODE;  Surgeon: Margaretha Sheffield, MD;  Location: [facility] ORS;  Service: ENT;  Laterality: Left;   IR REMOVAL TUN ACCESS W/ PORT W/O FL MOD SED  12/05/2021   LARYNGOSCOPY Bilateral 02/22/2019   Procedure: MICROSCOPIC DIRECT LARYNGOSCOPY AND BIOPSY;  Surgeon: Margaretha Sheffield, MD;  Location: [facility] ORS;  Service: ENT;  Laterality: Bilateral;   PORTA CATH INSERTION N/A 03/24/2019   Procedure: PORTA CATH INSERTION;  Surgeon: Katha Cabal, MD;  Location: [facility];  Service: Cardiovascular;  Laterality: N/A;   TONSILLECTOMY     TUBAL LIGATION     Social History:  reports that she has never smoked. She has never used smokeless tobacco. She reports that she does not currently use alcohol. She reports that she does not use  drugs.  Allergies  Allergen Reactions   Chlorhexidine     Pt doesn't have anything that requires CHG bath    Family History  Problem Relation Age of Onset   Breast cancer Sister    Brain cancer Paternal Grandfather     Prior to Admission medications   Medication  Sig Start Date End Date Taking? Authorizing Provider  atorvastatin (LIPITOR) 20 MG tablet Take 20 mg by mouth daily. 08/20/21   [provider]  baclofen (LIORESAL) 10 MG tablet Take 10 mg by mouth at bedtime as needed. 11/11/21   [provider]  gabapentin (NEURONTIN) 300 MG capsule Take 300 mg by mouth 4 (four) times daily. 09/27/20   [provider]  hydrOXYzine (ATARAX/VISTARIL) 25 MG tablet Take 25 mg by mouth daily as needed for nausea, vomiting or anxiety. 06/15/20   [provider]  meclizine (ANTIVERT) 12.5 MG tablet Take 1 tablet (12.5 mg total) by mouth 3 (three) times daily as needed for dizziness or nausea. 06/30/20   Dwyane Dee, MD  naproxen (NAPROSYN) 500 MG tablet Take 1 tablet (500 mg total) by mouth 2 (two) times daily with a meal. Patient not taking: Reported on 09/05/2021 12/25/20   Carrie Mew, MD  omeprazole (PRILOSEC) 40 MG capsule Take 1 capsule (40 mg total) by mouth 2 (two) times daily before a meal. 09/05/21 12/04/21  Vanga, Tally Due, MD  ondansetron (ZOFRAN ODT) 4 MG disintegrating tablet Take 1 tablet (4 mg total) by mouth every 6 (six) hours as needed for nausea or vomiting. Patient not taking: Reported on 11/21/2021 06/26/20   Ward, Delice Bison, DO  sertraline (ZOLOFT) 100 MG tablet Take 100 mg by mouth daily. 06/04/21   [provider]  traZODone (DESYREL) 100 MG tablet Take 100 mg by mouth daily. Patient not taking: Reported on 11/21/2021 06/04/21   [provider]  Vitamin D, Ergocalciferol, (DRISDOL) 1.25 MG (50000 UNIT) CAPS capsule Take 50,000 Units by mouth once a week. 06/16/20   [provider]    Physical Exam: Vitals:   01/23/22 0647 01/23/22 0649 01/23/22 0824 01/23/22 0900  BP:  (!) 179/93 118/75 123/73  Pulse:  (!) 55  (!) 55  Resp:  '16 18 12  '$ Temp:  97.6 F (36.4 C) 97.6 F (36.4 C)   TempSrc:  Oral Oral   SpO2:  95% 100% 96%  Weight: 77.1 kg  74.5 kg   Height: '5\' 2"'$  (1.575 m)  5'  2" (1.575 m)    Physical Exam Vitals and nursing note reviewed.  Constitutional:      Appearance: Normal appearance.  HENT:     Head: Normocephalic and atraumatic.     Nose: Nose normal.     Mouth/Throat:     Mouth: Mucous membranes are moist.  Eyes:     Conjunctiva/sclera: Conjunctivae normal.  Cardiovascular:     Rate and Rhythm: Bradycardia present.  Pulmonary:     Effort: Pulmonary effort is normal.     Breath sounds: Normal breath sounds.  Abdominal:     General: Abdomen is flat. Bowel sounds are normal.     Palpations: Abdomen is soft.  Musculoskeletal:        General: Normal range of motion.     Cervical back: Normal range of motion and neck supple.  Skin:    General: Skin is warm and dry.  Neurological:     General: No focal deficit present.     Mental Status: She is alert and oriented  to person, place, and time.  Psychiatric:        Mood and Affect: Mood normal.        Behavior: Behavior normal.     Data Reviewed: Relevant notes from primary care and specialist visits, past discharge summaries as available in EHR, including Care Everywhere. Prior diagnostic testing as pertinent to current admission diagnoses Updated medications and problem lists for reconciliation ED course, including vitals, labs, imaging, treatment and response to treatment Triage notes, nursing and pharmacy notes and ED provider's notes Notable results as noted in HPI Labs reviewed.  Sodium 140, potassium 3.7, chloride 111, bicarb 24, glucose 177, BUN 20, creatinine 0.85, calcium 9.0, total protein 6.9, albumin 4.0, AST 19, ALT 14, alkaline phosphatase 70, total bilirubin 0.8, total cholesterol 258, triglycerides 267, HDL 44, troponin 9 >> 3204, white count 8.0, hemoglobin 13.2, hematocrit 41.2, platelet count 372 Respiratory viral panel is negative Twelve-lead EKG reviewed by me shows sinus rhythm with an acute inferior wall MI There are no new results to review at this time.  Assessment  and Plan: * ST elevation myocardial infarction involving right coronary artery (HCC) Presents to the ER via EMS as a code STEMI, EKG in the field showed inferior wall MI  Patient was taken emergently for left heart cath which showed severe single-vessel coronary artery disease with 99% mid RCA stenosis, likely due to acute plaque rupture, with TIMI-2 flow.  There is also a hazy 50-60% proximal RCA stenosis as well as moderate to severe but not critical left coronary artery disease as detailed below. Normal left ventricular filling pressure (LVEDP 12 mmHg). Successful PCI to proximal and mid RCA stenoses using nonoverlapping Onyx Frontier 3.0 x 12 mm (proximal) and 2.75 x 18 mm (mid) drug-eluting stents with 0% residual stenosis and TIMI-3 flow.  Jailed RV marginal branch arising from the mid RCA demonstrates 70% stenosis pre-PCI, increased to 90% plaque shift.  There is TIMI-2 flow at the end of the procedure through this small branch, which is too small for intervention. Continue dual antiplatelet therapy Continue high intensity statin  Depression with anxiety Continue sertraline  GERD (gastroesophageal reflux disease) Continue PPI      Advance Care Planning:   Code Status: Full Code   Consults: Cardiology  Family Communication: Greater than 50% of time was spent discussing plan of care with patient in detail.  All questions and concerns have been addressed.  She verbalizes understanding and agrees to the plan.  CODE STATUS was discussed and she is a full code.   Severity of Illness: The appropriate patient status for this patient is INPATIENT. Inpatient status is judged to be reasonable and necessary in order to provide the required intensity of service to ensure the patient's safety. The patient's presenting symptoms, physical exam findings, and initial radiographic and laboratory data in the context of their chronic comorbidities is felt to place them at high risk for further clinical  deterioration. Furthermore, it is not anticipated that the patient will be medically stable for discharge from the hospital within 2 midnights of admission.   * I certify that at the point of admission it is my clinical judgment that the patient will require inpatient hospital care spanning beyond 2 midnights from the point of admission due to high intensity of service, high risk for further deterioration and high frequency of surveillance required.*  Author: Collier Bullock, MD 01/23/2022 10:22 AM  For on call review [email or web address].

## 2022-01-24 ENCOUNTER — Encounter: Payer: Self-pay | Admitting: Internal Medicine

## 2022-01-24 DIAGNOSIS — I4729 Other ventricular tachycardia: Secondary | ICD-10-CM

## 2022-01-24 DIAGNOSIS — I2111 ST elevation (STEMI) myocardial infarction involving right coronary artery: Secondary | ICD-10-CM | POA: Diagnosis not present

## 2022-01-24 DIAGNOSIS — E782 Mixed hyperlipidemia: Secondary | ICD-10-CM | POA: Diagnosis not present

## 2022-01-24 LAB — BASIC METABOLIC PANEL
Anion gap: 5 (ref 5–15)
BUN: 18 mg/dL (ref 8–23)
CO2: 24 mmol/L (ref 22–32)
Calcium: 9.1 mg/dL (ref 8.9–10.3)
Chloride: 112 mmol/L — ABNORMAL HIGH (ref 98–111)
Creatinine, Ser: 0.88 mg/dL (ref 0.44–1.00)
GFR, Estimated: >60 mL/min (ref >60)
Glucose, Bld: 102 mg/dL — ABNORMAL HIGH (ref 70–99)
Potassium: 4.1 mmol/L (ref 3.5–5.1)
Sodium: 141 mmol/L (ref 135–145)

## 2022-01-24 LAB — CBC
HCT: 36.1 % (ref 36.0–46.0)
Hemoglobin: 11.8 g/dL — ABNORMAL LOW (ref 12.0–15.0)
MCH: 28.7 pg (ref 26.0–34.0)
MCHC: 32.7 g/dL (ref 30.0–36.0)
MCV: 87.8 fL (ref 80.0–100.0)
Platelets: 295 10*3/uL (ref 150–400)
RBC: 4.11 MIL/uL (ref 3.87–5.11)
RDW: 14.3 % (ref 11.5–15.5)
WBC: 8.3 10*3/uL (ref 4.0–10.5)
nRBC: 0 % (ref 0.0–0.2)

## 2022-01-24 LAB — POCT ACTIVATED CLOTTING TIME
Activated Clotting Time: 217 seconds
Activated Clotting Time: 260 seconds

## 2022-01-24 LAB — MAGNESIUM: Magnesium: 2.4 mg/dL (ref 1.7–2.4)

## 2022-01-24 MED ORDER — METOPROLOL SUCCINATE ER 25 MG PO TB24
12.5000 mg | ORAL_TABLET | Freq: Every day | ORAL | Status: DC
  Administered 2022-01-24 – 2022-01-25 (×2): 12.5 mg via ORAL
  Filled 2022-01-24 (×2): qty 0.5

## 2022-01-24 MED ORDER — ORAL CARE MOUTH RINSE: 15.0000 mL | OROMUCOSAL | Status: DC | PRN

## 2022-01-24 MED ORDER — EZETIMIBE 10 MG PO TABS
10.0000 mg | ORAL_TABLET | Freq: Every day | ORAL | Status: DC
  Administered 2022-01-24 – 2022-01-25 (×2): 10 mg via ORAL
  Filled 2022-01-24 (×3): qty 1

## 2022-01-24 NOTE — Progress Notes (Signed)
Rounding Note    Patient Name: Connie Cameron Date of Encounter: 01/24/2022  [place] Cardiologist: None  New consult done by Dr. Saunders Revel Subjective   Patient seen on AM rounds. Denies any chest pain or shortness of breath. Endorses headache and vertigo. One episode on NSVT noted this AM on telemetry. Patient was asymptomatic.   Inpatient Medications    Scheduled Meds:  aspirin  81 mg Oral Daily   atorvastatin  80 mg Oral Daily   enoxaparin (LOVENOX) injection  40 mg Subcutaneous Q24H   pantoprazole  40 mg Oral Daily   sertraline  100 mg Oral Daily   sodium chloride flush  3 mL Intravenous Q12H   ticagrelor  90 mg Oral BID   Continuous Infusions:  sodium chloride 20 mL/hr at 01/23/22 1800   sodium chloride     PRN Meds: sodium chloride, acetaminophen, meclizine, ondansetron (ZOFRAN) IV, sodium chloride flush   Vital Signs    Vitals:   01/24/22 0300 01/24/22 0400 01/24/22 0500 01/24/22 0600  BP: 120/80 123/84 115/82 123/88  Pulse: (!) 57 (!) 57 (!) 57 63  Resp: (!) 9 (!) 9 (!) 1 15  Temp:  98.1 F (36.7 C)    TempSrc:  Oral    SpO2: 96% 95% 96% 97%  Weight:      Height:        Intake/Output Summary (Last 24 hours) at 01/24/2022 0739 Last data filed at 01/24/2022 0600 Gross per 24 hour  Intake 1201.67 ml  Output 2200 ml  Net -998.33 ml      01/23/2022    8:24 AM 01/23/2022    6:47 AM 12/05/2021    1:13 PM  Last 3 Weights  Weight (lbs) 164 lb 3.9 oz 170 lb 168 lb  Weight (kg) 74.5 kg 77.111 kg 76.204 kg      Telemetry    Sinus brady to sinus with rates 50-60 with occasional PAC's, 1 20 beat run of NSVT this morning (EKG completed) - Personally Reviewed  ECG    Sinus rate of 63 - Personally Reviewed  Physical Exam   GEN: No acute distress.   Neck: No JVD Cardiac: RRR, no murmurs, rubs, or gallops. Right radial cath site with small amount of bruising noted with hematoma, 1+ radial pulse, gauze dressing clean/dry/and intact to  site Respiratory: Clear to auscultation bilaterally. Respirations are unlabored on room air. GI: Soft, nontender, non-distended  MS: No edema; No deformity. Neuro:  Nonfocal  Psych: Normal affect   Labs    High Sensitivity Troponin:   Recent Labs  Lab 01/23/22 0652 01/23/22 0848  TROPONINIHS 9 3,204*     Chemistry Recent Labs  Lab 01/23/22 0652  NA 140  K 3.7  CL 111  CO2 24  GLUCOSE 177*  BUN 20  CREATININE 0.85  CALCIUM 9.0  PROT 6.9  ALBUMIN 4.0  AST 19  ALT 14  ALKPHOS 70  BILITOT 0.8  GFRNONAA >60  ANIONGAP 5    Lipids  Recent Labs  Lab 01/23/22 0652  CHOL 258*  TRIG 267*  HDL 44  LDLCALC 161*  CHOLHDL 5.9    Hematology Recent Labs  Lab 01/23/22 0652 01/24/22 0636  WBC 8.0 8.3  RBC 4.59 4.11  HGB 13.2 11.8*  HCT 41.2 36.1  MCV 89.8 87.8  MCH 28.8 28.7  MCHC 32.0 32.7  RDW 13.8 14.3  PLT 372 295   Thyroid No results for input(s): "TSH", "FREET4" in the last 168 hours.  BNPNo results for input(s): "BNP", "PROBNP" in the last 168 hours.  DDimer No results for input(s): "DDIMER" in the last 168 hours.   Radiology      Cardiac Studies  TTE 01/23/22 1. Left ventricular ejection fraction, by estimation, is 60 to 65%. The  left ventricle has normal function. The left ventricle has no regional  wall motion abnormalities. There is mild left ventricular hypertrophy.  Left ventricular diastolic parameters  were normal.   2. Right ventricular systolic function is normal. The right ventricular  size is not well visualized.   3. The mitral valve is normal in structure. Mild mitral valve  regurgitation.   4. The aortic valve is tricuspid. Aortic valve regurgitation is not  visualized. Aortic valve sclerosis is present, with no evidence of aortic  valve stenosis.   LHC 01/23/2022 Severe single-vessel coronary artery disease with 99% mid RCA stenosis, likely due to acute plaque rupture, with TIMI-2 flow.  There is also a hazy 50-60% proximal  RCA stenosis as well as moderate to severe but not critical left coronary artery disease as detailed below. Normal left ventricular filling pressure (LVEDP 12 mmHg). Successful PCI to proximal and mid RCA stenoses using nonoverlapping Onyx Frontier 3.0 x 12 mm (proximal) and 2.75 x 18 mm (mid) drug-eluting stents with 0% residual stenosis and TIMI-3 flow.  Jailed RV marginal branch arising from the mid RCA demonstrates 70% stenosis pre-PCI, increased to 90% plaque shift.  There is TIMI-2 flow at the end of the procedure through this small branch, which is too small for intervention.   Recommendations: Admit to ICU for post STEMI/PCI monitoring. Dual antiplatelet therapy with aspirin and ticagrelor for at least 12 months. Aggressive secondary prevention of coronary artery disease. Obtain echocardiogram. Favor medical therapy of the left coronary artery disease, as the most severe stenoses involve small/distal branches. Diagnostic Dominance: Right  Intervention    Implants    Patient Profile     61 y.o. female with a history of head and neck cancer, who is being seen and evaluated for chest pain, who was found to have an inferior STEMI and underwent emergency heart catheterization on 01/23/2022.  Assessment & Plan    Inferior STEMI -successful PCI/DES to the proximal and mid RCA -continued on asa 81 mg daily and tricagrelor recommended for at least 12 months -echocardiogram revealed LVEF 60-65%, no RWMA, and mild MR -continued on statin -cardiac rehab referral  -continue cardiac monitoring -out of bed and move around the room today -transfer to telemetry if bed becomes available  -EKG as needed for pain or changes -one episode for 20 beats of NSVT vs reperfusion noted on telemetry this morning, patient had complaints of headache and dizziness -previously not placed on beta blocker therapy due to baseline of sinus bradycardia -started on low dose toprol xl 12.5 mg daily secondary to  frequent PAC's and NSVT -potassium 4.1 -mg level added onto AM bloodwork  Hyperlipidemia -LDL 161 -continued on atorvastatin 80 mg daily -will need repeat lipid panel and hepatic panel in 6-8 weeks     For questions or updates, please contact [place] Please consult [email or web address] for contact info under        Signed, Starr Engel, NP  01/24/2022, 7:39 AM

## 2022-01-24 NOTE — Progress Notes (Signed)
PROGRESS NOTE    Connie Cameron  OEU:[id number] DOB: Aug 24, 1960 DOA: 01/23/2022  PCP: [place]    Brief Narrative:  This 61 years old female with PMH significant for anxiety, depression, GERD, history of oral cancer now in remission presented in the ED for the evaluation of midsternal chest pain that woke her up from the sleep at 5:30 in the morning.  She rated chest pain as 7/10 in intensity associated with diaphoresis nausea and emesis.  After she arrived in the ED EKG concerning for inferior ischemia so code STEMI was called.  Patient was taken to the left heart cath. She was found to have severe single-vessel coronary artery disease with 99% mid RCA stenosis.  He underwent successful PCI.  Assessment & Plan:   Principal Problem:   ST elevation myocardial infarction involving right coronary artery (HCC) Active Problems:   GERD (gastroesophageal reflux disease)   Depression with anxiety   Mixed hyperlipidemia   NSVT (nonsustained ventricular tachycardia) (HCC)  ST elevation MI: Patient presented with severe chest pain that woke her up in the morning from sleep.  EKG concerning for inferior wall STEMI. Patient was taken to the left heart cath which showed severe single-vessel CAD with 99% mid RCA stenosis.   Patient underwent successful PCI. Continue dual antiplatelet agents (aspirin and Brilinta) Continue high intensity statin. Echocardiogram shows normal LVEF, no focal wall motion abnormalities. Continue inpatient monitoring for 24 hours s/p PCI and high risk for arrhythmias in 48 hours after event.  Depression with anxiety: Continue Zoloft  GERD : Continue pantoprazole.  Hyperlipidemia: Continue Lipitor 80 mg daily. Continue Zetia 10 mg daily  Nonsustained VT: Started on low-dose metoprolol.   DVT prophylaxis: Lovenox Code Status: Full code Family Communication: No family at bedside Disposition Plan:   Status is: Inpatient Remains inpatient appropriate  because: Admitted for STEMI undergoing left heart cath,  found to have severe single-vessel disease underwent PCI.  Monitoring for 24 hours post PCI for arrhythmias..   Consultants:  Cardiology  Procedures: LHC post PCI Antimicrobials:  None  Subjective: Patient is seen and examined at bedside.  Overnight events noted.   Patient reports doing better,  denies any chest pain. She underwent successful PCI of the stenosed artery.  Objective: Vitals:   01/24/22 0900 01/24/22 1000 01/24/22 1045 01/24/22 1100  BP: 115/80 106/73 107/76 103/68  Pulse: 70 88 72 62  Resp: '15 16 20 16  '$ Temp:      TempSrc:      SpO2: 95% 98% 97% 97%  Weight:      Height:        Intake/Output Summary (Last 24 hours) at 01/24/2022 1309 Last data filed at 01/24/2022 1044 Gross per 24 hour  Intake 1090 ml  Output 2200 ml  Net -1110 ml   Filed Weights   01/23/22 0647 01/23/22 0824  Weight: 77.1 kg 74.5 kg    Examination:  General exam: Appears comfortable, not in any acute distress. Respiratory system: Clear to auscultation. Respiratory effort normal. Cardiovascular system: S1 & S2 heard, regular rate and rhythm, no murmur. Gastrointestinal system: Abdomen is soft, non tender, non distended, BS+ Central nervous system: Alert and oriented X 3. No focal neurological deficits. Extremities: No edema, no cyanosis, no clubbing Skin: No rashes, lesions or ulcers Psychiatry: Judgement and insight appear normal. Mood & affect appropriate.     Data Reviewed: I have personally reviewed following labs and imaging studies  CBC: Recent Labs  Lab 01/23/22 0652 01/24/22 0636  WBC 8.0 8.3  NEUTROABS 5.1  --   HGB 13.2 11.8*  HCT 41.2 36.1  MCV 89.8 87.8  PLT 372 885   Basic Metabolic Panel: Recent Labs  Lab 01/23/22 0652 01/24/22 0629 01/24/22 0636  NA 140  --  141  K 3.7  --  4.1  CL 111  --  112*  CO2 24  --  24  GLUCOSE 177*  --  102*  BUN 20  --  18  CREATININE 0.85  --  0.88  CALCIUM  9.0  --  9.1  MG  --  2.4  --    GFR: Estimated Creatinine Clearance: 63.5 mL/min (by C-G formula based on SCr of 0.88 mg/dL). Liver Function Tests: Recent Labs  Lab 01/23/22 0652  AST 19  ALT 14  ALKPHOS 70  BILITOT 0.8  PROT 6.9  ALBUMIN 4.0   No results for input(s): "LIPASE", "AMYLASE" in the last 168 hours. No results for input(s): "AMMONIA" in the last 168 hours. Coagulation Profile: Recent Labs  Lab 01/23/22 0652  INR 1.1   Cardiac Enzymes: No results for input(s): "CKTOTAL", "CKMB", "CKMBINDEX", "TROPONINI" in the last 168 hours. BNP (last 3 results) No results for input(s): "PROBNP" in the last 8760 hours. HbA1C: Recent Labs    01/23/22 0652  HGBA1C 6.2*   CBG: Recent Labs  Lab 01/23/22 0824  GLUCAP 126*   Lipid Profile: Recent Labs    01/23/22 0652  CHOL 258*  HDL 44  LDLCALC 161*  TRIG 267*  CHOLHDL 5.9   Thyroid Function Tests: No results for input(s): "TSH", "T4TOTAL", "FREET4", "T3FREE", "THYROIDAB" in the last 72 hours. Anemia Panel: No results for input(s): "VITAMINB12", "FOLATE", "FERRITIN", "TIBC", "IRON", "RETICCTPCT" in the last 72 hours. Sepsis Labs: No results for input(s): "PROCALCITON", "LATICACIDVEN" in the last 168 hours.  Recent Results (from the past 240 hour(s))  Resp panel by RT-PCR (RSV, Flu A&B, Covid) Anterior Nasal Swab     Status: None   Collection Time: 01/23/22  6:52 AM   Specimen: Anterior Nasal Swab  Result Value Ref Range Status   SARS Coronavirus 2 by RT PCR NEGATIVE NEGATIVE Final    Comment: (NOTE) SARS-CoV-2 target nucleic acids are NOT DETECTED.  The SARS-CoV-2 RNA is generally detectable in upper respiratory specimens during the acute phase of infection. The lowest concentration of SARS-CoV-2 viral copies this assay can detect is 138 copies/mL. A negative result does not preclude SARS-Cov-2 infection and should not be used as the sole basis for treatment or other patient management decisions. A  negative result may occur with  improper specimen collection/handling, submission of specimen other than nasopharyngeal swab, presence of viral mutation(s) within the areas targeted by this assay, and inadequate number of viral copies(<138 copies/mL). A negative result must be combined with clinical observations, patient history, and epidemiological information. The expected result is Negative.  Fact Sheet for Patients:  EntrepreneurPulse.com.au  Fact Sheet for Healthcare Providers:  IncredibleEmployment.be  This test is no t yet approved or cleared by the [place] FDA and  has been authorized for detection and/or diagnosis of SARS-CoV-2 by FDA under an Emergency Use Authorization (EUA). This EUA will remain  in effect (meaning this test can be used) for the duration of the COVID-19 declaration under Section 564(b)(1) of the Act, 21 U.S.C.section 360bbb-3(b)(1), unless the authorization is terminated  or revoked sooner.       Influenza A by PCR NEGATIVE NEGATIVE Final   Influenza B by PCR NEGATIVE NEGATIVE  Final    Comment: (NOTE) The Xpert Xpress SARS-CoV-2/FLU/RSV plus assay is intended as an aid in the diagnosis of influenza from Nasopharyngeal swab specimens and should not be used as a sole basis for treatment. Nasal washings and aspirates are unacceptable for Xpert Xpress SARS-CoV-2/FLU/RSV testing.  Fact Sheet for Patients: EntrepreneurPulse.com.au  Fact Sheet for Healthcare Providers: IncredibleEmployment.be  This test is not yet approved or cleared by the [place] FDA and has been authorized for detection and/or diagnosis of SARS-CoV-2 by FDA under an Emergency Use Authorization (EUA). This EUA will remain in effect (meaning this test can be used) for the duration of the COVID-19 declaration under Section 564(b)(1) of the Act, 21 U.S.C. section 360bbb-3(b)(1), unless the authorization  is terminated or revoked.     Resp Syncytial Virus by PCR NEGATIVE NEGATIVE Final    Comment: (NOTE) Fact Sheet for Patients: EntrepreneurPulse.com.au  Fact Sheet for Healthcare Providers: IncredibleEmployment.be  This test is not yet approved or cleared by the [place] FDA and has been authorized for detection and/or diagnosis of SARS-CoV-2 by FDA under an Emergency Use Authorization (EUA). This EUA will remain in effect (meaning this test can be used) for the duration of the COVID-19 declaration under Section 564(b)(1) of the Act, 21 U.S.C. section 360bbb-3(b)(1), unless the authorization is terminated or revoked.  Performed at [facility], [address] [place], [place] [address]   MRSA Next Gen by PCR, Nasal     Status: None   Collection Time: 01/23/22  8:35 AM   Specimen: Nasal Mucosa; Nasal Swab  Result Value Ref Range Status   MRSA by PCR Next Gen NOT DETECTED NOT DETECTED Final    Comment: (NOTE) The GeneXpert MRSA Assay (FDA approved for NASAL specimens only), is one component of a comprehensive MRSA colonization surveillance program. It is not intended to diagnose MRSA infection nor to guide or monitor treatment for MRSA infections. Test performance is not FDA approved in patients less than 34 years old. Performed at [facility], [address] [place], [place] [address]      Radiology Studies: ECHOCARDIOGRAM COMPLETE  Result Date: 01/23/2022    ECHOCARDIOGRAM REPORT   Patient Name:   MAYBELLINE KOLARIK Date of Exam: 01/23/2022 Medical Rec #:  [id number]       Height:       62.0 in Accession #:    [id number]      Weight:       164.2 lb Date of Birth:  11/15/1960        BSA:          1.758 m Patient Age:    5 years        BP:           135/84 mmHg Patient Gender: F               HR:           60 bpm. Exam Location:  [facility] Procedure: 2D Echo, Color Doppler and Cardiac Doppler Indications:     I21.9 Acute  myocardial infarction, unspecified  History:         Patient has no prior history of Echocardiogram examinations.                  Signs/Symptoms:Chest Pain.  Sonographer:     Charmayne Sheer Referring Phys:  [phone] CHRISTOPHER END Diagnosing Phys: Kate Sable MD  Sonographer Comments: Suboptimal apical window. IMPRESSIONS  1. Left ventricular ejection fraction, by estimation,  is 60 to 65%. The left ventricle has normal function. The left ventricle has no regional wall motion abnormalities. There is mild left ventricular hypertrophy. Left ventricular diastolic parameters were normal.  2. Right ventricular systolic function is normal. The right ventricular size is not well visualized.  3. The mitral valve is normal in structure. Mild mitral valve regurgitation.  4. The aortic valve is tricuspid. Aortic valve regurgitation is not visualized. Aortic valve sclerosis is present, with no evidence of aortic valve stenosis. FINDINGS  Left Ventricle: Left ventricular ejection fraction, by estimation, is 60 to 65%. The left ventricle has normal function. The left ventricle has no regional wall motion abnormalities. The left ventricular internal cavity size was normal in size. There is  mild left ventricular hypertrophy. Left ventricular diastolic parameters were normal. Right Ventricle: The right ventricular size is not well visualized. No increase in right ventricular wall thickness. Right ventricular systolic function is normal. Left Atrium: Left atrial size was normal in size. Right Atrium: Right atrial size was normal in size. Pericardium: There is no evidence of pericardial effusion. Mitral Valve: The mitral valve is normal in structure. Mild mitral valve regurgitation. Tricuspid Valve: The tricuspid valve is normal in structure. Tricuspid valve regurgitation is mild. Aortic Valve: The aortic valve is tricuspid. Aortic valve regurgitation is not visualized. Aortic valve sclerosis is present, with no evidence of aortic  valve stenosis. Aortic valve mean gradient measures 4.0 mmHg. Aortic valve peak gradient measures 7.1  mmHg. Aortic valve area, by VTI measures 1.94 cm. Pulmonic Valve: The pulmonic valve was normal in structure. Pulmonic valve regurgitation is not visualized. Aorta: The aortic root and ascending aorta are structurally normal, with no evidence of dilitation. Venous: The inferior vena cava was not well visualized. IAS/Shunts: No atrial level shunt detected by color flow Doppler.  LEFT VENTRICLE PLAX 2D LVIDd:         3.00 cm   Diastology LVIDs:         2.30 cm   LV e' medial:    4.57 cm/s LV PW:         1.30 cm   LV E/e' medial:  15.8 LV IVS:        1.00 cm   LV e' lateral:   10.60 cm/s LVOT diam:     1.70 cm   LV E/e' lateral: 6.8 LV SV:         47 LV SV Index:   27 LVOT Area:     2.27 cm  LEFT ATRIUM             Index LA diam:        3.30 cm 1.88 cm/m LA Vol (A2C):   33.6 ml 19.11 ml/m LA Vol (A4C):   34.5 ml 19.62 ml/m LA Biplane Vol: 35.7 ml 20.31 ml/m  AORTIC VALVE                    PULMONIC VALVE AV Area (Vmax):    1.70 cm     PV Vmax:       1.17 m/s AV Area (Vmean):   1.58 cm     PV Vmean:      74.700 cm/s AV Area (VTI):     1.94 cm     PV VTI:        0.215 m AV Vmax:           133.00 cm/s  PV Peak grad:  5.5 mmHg AV Vmean:  95.200 cm/s  PV Mean grad:  3.0 mmHg AV VTI:            0.244 m AV Peak Grad:      7.1 mmHg AV Mean Grad:      4.0 mmHg LVOT Vmax:         99.80 cm/s LVOT Vmean:        66.400 cm/s LVOT VTI:          0.209 m LVOT/AV VTI ratio: 0.86  AORTA Ao Root diam: 2.90 cm MITRAL VALVE MV Area (PHT): 2.52 cm    SHUNTS MV Decel Time: 301 msec    Systemic VTI:  0.21 m MV E velocity: 72.30 cm/s  Systemic Diam: 1.70 cm MV A velocity: 68.70 cm/s MV E/A ratio:  1.05 Kate Sable MD Electronically signed by Kate Sable MD Signature Date/Time: 01/23/2022/5:35:51 PM    Final    CARDIAC CATHETERIZATION  Result Date: 01/23/2022 Conclusions: Severe single-vessel coronary artery  disease with 99% mid RCA stenosis, likely due to acute plaque rupture, with TIMI-2 flow.  There is also a hazy 50-60% proximal RCA stenosis as well as moderate to severe but not critical left coronary artery disease as detailed below. Normal left ventricular filling pressure (LVEDP 12 mmHg). Successful PCI to proximal and mid RCA stenoses using nonoverlapping Onyx Frontier 3.0 x 12 mm (proximal) and 2.75 x 18 mm (mid) drug-eluting stents with 0% residual stenosis and TIMI-3 flow.  Jailed RV marginal branch arising from the mid RCA demonstrates 70% stenosis pre-PCI, increased to 90% plaque shift.  There is TIMI-2 flow at the end of the procedure through this small branch, which is too small for intervention. Recommendations: Admit to ICU for post STEMI/PCI monitoring. Dual antiplatelet therapy with aspirin and ticagrelor for at least 12 months. Aggressive secondary prevention of coronary artery disease. Obtain echocardiogram. Favor medical therapy of the left coronary artery disease, as the most severe stenoses involve small/distal branches. Nelva Bush, MD [facility]   Scheduled Meds:  aspirin  81 mg Oral Daily   atorvastatin  80 mg Oral Daily   enoxaparin (LOVENOX) injection  40 mg Subcutaneous Q24H   ezetimibe  10 mg Oral Daily   metoprolol succinate  12.5 mg Oral Daily   pantoprazole  40 mg Oral Daily   sertraline  100 mg Oral Daily   sodium chloride flush  3 mL Intravenous Q12H   ticagrelor  90 mg Oral BID   Continuous Infusions:  sodium chloride 20 mL/hr at 01/23/22 1800   sodium chloride       LOS: 1 day    Time spent: 50 mins    Tationna Fullard, MD [facility]   If 7PM-7AM, please contact night-coverage

## 2022-01-25 DIAGNOSIS — I2111 ST elevation (STEMI) myocardial infarction involving right coronary artery: Secondary | ICD-10-CM | POA: Diagnosis not present

## 2022-01-25 LAB — BASIC METABOLIC PANEL
Anion gap: 7 (ref 5–15)
BUN: 25 mg/dL — ABNORMAL HIGH (ref 8–23)
CO2: 23 mmol/L (ref 22–32)
Calcium: 9.1 mg/dL (ref 8.9–10.3)
Chloride: 110 mmol/L (ref 98–111)
Creatinine, Ser: 0.94 mg/dL (ref 0.44–1.00)
GFR, Estimated: >60 mL/min (ref >60)
Glucose, Bld: 145 mg/dL — ABNORMAL HIGH (ref 70–99)
Potassium: 4 mmol/L (ref 3.5–5.1)
Sodium: 140 mmol/L (ref 135–145)

## 2022-01-25 LAB — CBC
HCT: 36.5 % (ref 36.0–46.0)
Hemoglobin: 11.9 g/dL — ABNORMAL LOW (ref 12.0–15.0)
MCH: 28.6 pg (ref 26.0–34.0)
MCHC: 32.6 g/dL (ref 30.0–36.0)
MCV: 87.7 fL (ref 80.0–100.0)
Platelets: 287 10*3/uL (ref 150–400)
RBC: 4.16 MIL/uL (ref 3.87–5.11)
RDW: 14.3 % (ref 11.5–15.5)
WBC: 7.5 10*3/uL (ref 4.0–10.5)
nRBC: 0 % (ref 0.0–0.2)

## 2022-01-25 LAB — LIPOPROTEIN A (LPA): Lipoprotein (a): 318 nmol/L — ABNORMAL HIGH (ref <75.0)

## 2022-01-25 LAB — PHOSPHORUS: Phosphorus: 3.5 mg/dL (ref 2.5–4.6)

## 2022-01-25 LAB — MAGNESIUM: Magnesium: 2.5 mg/dL — ABNORMAL HIGH (ref 1.7–2.4)

## 2022-01-25 LAB — TROPONIN I (HIGH SENSITIVITY): Troponin I (High Sensitivity): 2532 ng/L (ref <18)

## 2022-01-25 MED ORDER — ATORVASTATIN CALCIUM 80 MG PO TABS: 80.0000 mg | ORAL_TABLET | Freq: Every day | ORAL | 1 refills | Status: DC

## 2022-01-25 MED ORDER — METOPROLOL SUCCINATE ER 50 MG PO TB24: 25.0000 mg | ORAL_TABLET | Freq: Every day | ORAL | Status: DC

## 2022-01-25 MED ORDER — PANTOPRAZOLE SODIUM 40 MG PO TBEC: 40.0000 mg | DELAYED_RELEASE_TABLET | Freq: Every day | ORAL | 1 refills | Status: DC

## 2022-01-25 MED ORDER — TICAGRELOR 90 MG PO TABS: 90.0000 mg | ORAL_TABLET | Freq: Two times a day (BID) | ORAL | 3 refills | Status: DC

## 2022-01-25 MED ORDER — EZETIMIBE 10 MG PO TABS: 10.0000 mg | ORAL_TABLET | Freq: Every day | ORAL | 1 refills | Status: DC

## 2022-01-25 MED ORDER — METOPROLOL SUCCINATE ER 25 MG PO TB24: 25.0000 mg | ORAL_TABLET | Freq: Every day | ORAL | 1 refills | Status: DC

## 2022-01-25 MED ORDER — ASPIRIN 81 MG PO CHEW: 81.0000 mg | CHEWABLE_TABLET | Freq: Every day | ORAL | 1 refills | Status: AC

## 2022-01-25 NOTE — Discharge Summary (Signed)
Physician Discharge Summary  Connie Cameron OJJ:[id number] DOB: Sep 26, 1960 DOA: 01/23/2022  PCP: [place]  Admit date: 01/23/2022  Discharge date: 01/25/2022  Admitted From: Home.  Disposition:  Home  Recommendations for Outpatient Follow-up:  Follow up with PCP in 1-2 weeks. Please obtain BMP/CBC in one week. Advised to follow-up with Cardiology as scheduled. Advised to take aspirin and Brilinta for at least 1 year following PTCA. Advised to take Lipitor 80 mg daily for hyperlipidemia.  Home Health:None Equipment/Devices:None  Discharge Condition: Stable CODE STATUS:Full code Diet recommendation: Heart Healthy   Brief [facility] Course: This 61 years old female with PMH significant for anxiety, depression, GERD, history of oral cancer now in remission presented in the ED for the evaluation of midsternal chest pain that woke her up from the sleep at 5:30 in the morning.  She rated chest pain as 7/10 in intensity associated with diaphoresis,  nausea and emesis.  After she arrived in the ED.  EKG concerning for inferior ischemia so code STEMI was called.  Patient was taken to the left heart cath. She was found to have severe single-vessel coronary artery disease with 99% mid RCA stenosis.  She underwent successful PCI.  She was admitted for observation for 24 hours following PTCA.  Patient denies any chest pain, remained hemodynamically stable afterwards.  No arrhythmias were noted on telemetry.  Patient cleared from cardiology to be discharged on dual antiplatelet therapy for at least 1 year.  Patient is being discharged home.   Discharge Diagnoses:  Principal Problem:   ST elevation myocardial infarction involving right coronary artery (HCC) Active Problems:   GERD (gastroesophageal reflux disease)   Depression with anxiety   Mixed hyperlipidemia   NSVT (nonsustained ventricular tachycardia) (HCC)  ST elevation MI: Patient presented with severe chest pain that  woke her up in the morning from sleep.   EKG concerning for inferior wall STEMI. Patient was taken to the left heart cath which showed severe single-vessel CAD with 99% mid RCA stenosis.   Patient underwent successful PCI. Continue dual antiplatelet agents (aspirin and Brilinta) Continue high intensity statin. Echocardiogram shows normal LVEF, no focal wall motion abnormalities. Continue inpatient monitoring for 24 hours s/p PCI and high risk for arrhythmias in 48 hours after event. Likely signed off.  Patient is being discharged home on the DAPT for 1 year   Depression with anxiety: Continue Zoloft.   GERD : Continue pantoprazole.   Hyperlipidemia: Continue Lipitor 80 mg daily. Continue Zetia 10 mg daily.   Nonsustained VT: Started on low-dose metoprolol.  Discharge Instructions  Discharge Instructions     AMB Referral to Cardiac Rehabilitation - Phase II   Complete by: As directed    Diagnosis:  Coronary Stents STEMI     After initial evaluation and assessments completed: Virtual Based Care may be provided alone or in conjunction with Phase 2 Cardiac Rehab based on patient barriers.: Yes   Intensive Cardiac Rehabilitation (ICR) [place] location only OR Traditional Cardiac Rehabilitation (TCR) *If criteria for ICR are not met will enroll in TCR [facility] only): Yes   Call MD for:  difficulty breathing, headache or visual disturbances   Complete by: As directed    Call MD for:  persistant dizziness or light-headedness   Complete by: As directed    Call MD for:  persistant nausea and vomiting   Complete by: As directed    Diet - low sodium heart healthy   Complete by: As directed    Diet  Carb Modified   Complete by: As directed    Discharge instructions   Complete by: As directed    Advised to follow-up with primary care physician in 1 week. Advised to follow-up with cardiology as scheduled. Advised to take aspirin and Brilinta for at least 1 year following PTCA. Advised to  take Lipitor 80 mg daily for hyperlipidemia.   Increase activity slowly   Complete by: As directed       Allergies as of 01/25/2022       Reactions   Chlorhexidine    Pt doesn't have anything that requires CHG bath        Medication List     STOP taking these medications    naproxen 500 MG tablet Commonly known as: Naprosyn   omeprazole 40 MG capsule Commonly known as: PRILOSEC Replaced by: pantoprazole 40 MG tablet   ondansetron 4 MG disintegrating tablet Commonly known as: Zofran ODT   traZODone 100 MG tablet Commonly known as: DESYREL       TAKE these medications    aspirin 81 MG chewable tablet Chew 1 tablet (81 mg total) by mouth daily. Start taking on: January 26, 2022   atorvastatin 80 MG tablet Commonly known as: LIPITOR Take 1 tablet (80 mg total) by mouth daily. Start taking on: January 26, 2022 What changed:  medication strength how much to take   baclofen 10 MG tablet Commonly known as: LIORESAL Take 10 mg by mouth at bedtime as needed.   ezetimibe 10 MG tablet Commonly known as: ZETIA Take 1 tablet (10 mg total) by mouth daily. Start taking on: January 26, 2022   gabapentin 300 MG capsule Commonly known as: NEURONTIN Take 300 mg by mouth 4 (four) times daily.   hydrOXYzine 25 MG tablet Commonly known as: ATARAX Take 25 mg by mouth daily as needed for nausea, vomiting or anxiety.   meclizine 12.5 MG tablet Commonly known as: ANTIVERT Take 1 tablet (12.5 mg total) by mouth 3 (three) times daily as needed for dizziness or nausea.   metoprolol succinate 25 MG 24 hr tablet Commonly known as: TOPROL-XL Take 1 tablet (25 mg total) by mouth daily. Take with or immediately following a meal. Start taking on: January 26, 2022   pantoprazole 40 MG tablet Commonly known as: PROTONIX Take 1 tablet (40 mg total) by mouth daily. Start taking on: January 26, 2022 Replaces: omeprazole 40 MG capsule   sertraline 100 MG tablet Commonly  known as: ZOLOFT Take 100 mg by mouth daily.   ticagrelor 90 MG Tabs tablet Commonly known as: BRILINTA Take 1 tablet (90 mg total) by mouth 2 (two) times daily.   Vitamin D (Ergocalciferol) 1.25 MG (50000 UNIT) Caps capsule Commonly known as: DRISDOL Take 50,000 Units by mouth once a week.        Follow-up Information     [facility] Follow up in 1 week(s).   Why: CALLED X 3 TIMES WOULD NOT ANSWEER PHONE. HAVE PT CALL FOR AN APPOINTMENT [phone] [facility] Contact information: [place] [address] [phone]         Wellington Hampshire, MD Follow up on 02/15/2022.   Specialty: Cardiology Why: appt with DR Willene Hatchet AT 3 PM Contact information: [address] [place]  [address] [phone]                Allergies  Allergen Reactions   Chlorhexidine     Pt doesn't  have anything that requires CHG bath    Consultations: Cardiology  Procedures/Studies: ECHOCARDIOGRAM COMPLETE  Result Date: 01/23/2022    ECHOCARDIOGRAM REPORT   Patient Name:   TAKIAH MAIDEN Date of Exam: 01/23/2022 Medical Rec #:  [id number]       Height:       62.0 in Accession #:    [id number]      Weight:       164.2 lb Date of Birth:  31-May-1960        BSA:          1.758 m Patient Age:    18 years        BP:           135/84 mmHg Patient Gender: F               HR:           60 bpm. Exam Location:  [facility] Procedure: 2D Echo, Color Doppler and Cardiac Doppler Indications:     I21.9 Acute myocardial infarction, unspecified  History:         Patient has no prior history of Echocardiogram examinations.                  Signs/Symptoms:Chest Pain.  Sonographer:     Charmayne Sheer Referring Phys:  [phone] CHRISTOPHER END Diagnosing Phys: Kate Sable MD  Sonographer Comments: Suboptimal apical window. IMPRESSIONS  1. Left ventricular ejection fraction, by estimation, is 60 to 65%. The left ventricle has normal function. The left ventricle has no  regional wall motion abnormalities. There is mild left ventricular hypertrophy. Left ventricular diastolic parameters were normal.  2. Right ventricular systolic function is normal. The right ventricular size is not well visualized.  3. The mitral valve is normal in structure. Mild mitral valve regurgitation.  4. The aortic valve is tricuspid. Aortic valve regurgitation is not visualized. Aortic valve sclerosis is present, with no evidence of aortic valve stenosis. FINDINGS  Left Ventricle: Left ventricular ejection fraction, by estimation, is 60 to 65%. The left ventricle has normal function. The left ventricle has no regional wall motion abnormalities. The left ventricular internal cavity size was normal in size. There is  mild left ventricular hypertrophy. Left ventricular diastolic parameters were normal. Right Ventricle: The right ventricular size is not well visualized. No increase in right ventricular wall thickness. Right ventricular systolic function is normal. Left Atrium: Left atrial size was normal in size. Right Atrium: Right atrial size was normal in size. Pericardium: There is no evidence of pericardial effusion. Mitral Valve: The mitral valve is normal in structure. Mild mitral valve regurgitation. Tricuspid Valve: The tricuspid valve is normal in structure. Tricuspid valve regurgitation is mild. Aortic Valve: The aortic valve is tricuspid. Aortic valve regurgitation is not visualized. Aortic valve sclerosis is present, with no evidence of aortic valve stenosis. Aortic valve mean gradient measures 4.0 mmHg. Aortic valve peak gradient measures 7.1  mmHg. Aortic valve area, by VTI measures 1.94 cm. Pulmonic Valve: The pulmonic valve was normal in structure. Pulmonic valve regurgitation is not visualized. Aorta: The aortic root and ascending aorta are structurally normal, with no evidence of dilitation. Venous: The inferior vena cava was not well visualized. IAS/Shunts: No atrial level shunt detected  by color flow Doppler.  LEFT VENTRICLE PLAX 2D LVIDd:         3.00 cm   Diastology LVIDs:         2.30 cm   LV e' medial:  4.57 cm/s LV PW:         1.30 cm   LV E/e' medial:  15.8 LV IVS:        1.00 cm   LV e' lateral:   10.60 cm/s LVOT diam:     1.70 cm   LV E/e' lateral: 6.8 LV SV:         47 LV SV Index:   27 LVOT Area:     2.27 cm  LEFT ATRIUM             Index LA diam:        3.30 cm 1.88 cm/m LA Vol (A2C):   33.6 ml 19.11 ml/m LA Vol (A4C):   34.5 ml 19.62 ml/m LA Biplane Vol: 35.7 ml 20.31 ml/m  AORTIC VALVE                    PULMONIC VALVE AV Area (Vmax):    1.70 cm     PV Vmax:       1.17 m/s AV Area (Vmean):   1.58 cm     PV Vmean:      74.700 cm/s AV Area (VTI):     1.94 cm     PV VTI:        0.215 m AV Vmax:           133.00 cm/s  PV Peak grad:  5.5 mmHg AV Vmean:          95.200 cm/s  PV Mean grad:  3.0 mmHg AV VTI:            0.244 m AV Peak Grad:      7.1 mmHg AV Mean Grad:      4.0 mmHg LVOT Vmax:         99.80 cm/s LVOT Vmean:        66.400 cm/s LVOT VTI:          0.209 m LVOT/AV VTI ratio: 0.86  AORTA Ao Root diam: 2.90 cm MITRAL VALVE MV Area (PHT): 2.52 cm    SHUNTS MV Decel Time: 301 msec    Systemic VTI:  0.21 m MV E velocity: 72.30 cm/s  Systemic Diam: 1.70 cm MV A velocity: 68.70 cm/s MV E/A ratio:  1.05 Kate Sable MD Electronically signed by Kate Sable MD Signature Date/Time: 01/23/2022/5:35:51 PM    Final    CARDIAC CATHETERIZATION  Result Date: 01/23/2022 Conclusions: Severe single-vessel coronary artery disease with 99% mid RCA stenosis, likely due to acute plaque rupture, with TIMI-2 flow.  There is also a hazy 50-60% proximal RCA stenosis as well as moderate to severe but not critical left coronary artery disease as detailed below. Normal left ventricular filling pressure (LVEDP 12 mmHg). Successful PCI to proximal and mid RCA stenoses using nonoverlapping Onyx Frontier 3.0 x 12 mm (proximal) and 2.75 x 18 mm (mid) drug-eluting stents with 0% residual  stenosis and TIMI-3 flow.  Jailed RV marginal branch arising from the mid RCA demonstrates 70% stenosis pre-PCI, increased to 90% plaque shift.  There is TIMI-2 flow at the end of the procedure through this small branch, which is too small for intervention. Recommendations: Admit to ICU for post STEMI/PCI monitoring. Dual antiplatelet therapy with aspirin and ticagrelor for at least 12 months. Aggressive secondary prevention of coronary artery disease. Obtain echocardiogram. Favor medical therapy of the left coronary artery disease, as the most severe stenoses involve small/distal branches. Nelva Bush, MD [facility] , s/p PTCA  Subjective: Patient was seen and examined at bedside.  Overnight events noted.   Patient reports doing much better,  denies any chest pain and wants to be discharged.  Discharge Exam: Vitals:   01/25/22 1023 01/25/22 1405  BP: 115/71 116/76  Pulse: 85 76  Resp: 17 18  Temp: 97.7 F (36.5 C) 98.7 F (37.1 C)  SpO2: 97% 99%   Vitals:   01/25/22 0000 01/25/22 0500 01/25/22 1023 01/25/22 1405  BP: 105/67 114/77 115/71 116/76  Pulse: (!) 59 (!) 57 85 76  Resp: _0 Temp: 98.1 F (36.7 C) 98.3 F (36.8 C) 97.7 F (36.5 C) 98.7 F (37.1 C)  TempSrc: Oral Oral Oral Oral  SpO2: 95% 97% 97% 99%  Weight:      Height:        General: Pt is alert, awake, not in acute distress Cardiovascular: RRR, S1/S2 +, no rubs, no gallops Respiratory: CTA bilaterally, no wheezing, no rhonchi Abdominal: Soft, NT, ND, bowel sounds + Extremities: no edema, no cyanosis    The results of significant diagnostics from this hospitalization (including imaging, microbiology, ancillary and laboratory) are listed below for reference.     Microbiology: Recent Results (from the past 240 hour(s))  Resp panel by RT-PCR (RSV, Flu A&B, Covid) Anterior Nasal Swab     Status: None   Collection Time: 01/23/22  6:52 AM   Specimen: Anterior Nasal Swab  Result Value Ref  Range Status   SARS Coronavirus 2 by RT PCR NEGATIVE NEGATIVE Final    Comment: (NOTE) SARS-CoV-2 target nucleic acids are NOT DETECTED.  The SARS-CoV-2 RNA is generally detectable in upper respiratory specimens during the acute phase of infection. The lowest concentration of SARS-CoV-2 viral copies this assay can detect is 138 copies/mL. A negative result does not preclude SARS-Cov-2 infection and should not be used as the sole basis for treatment or other patient management decisions. A negative result may occur with  improper specimen collection/handling, submission of specimen other than nasopharyngeal swab, presence of viral mutation(s) within the areas targeted by this assay, and inadequate number of viral copies(<138 copies/mL). A negative result must be combined with clinical observations, patient history, and epidemiological information. The expected result is Negative.  Fact Sheet for Patients:  EntrepreneurPulse.com.au  Fact Sheet for Healthcare Providers:  IncredibleEmployment.be  This test is no t yet approved or cleared by the [place] FDA and  has been authorized for detection and/or diagnosis of SARS-CoV-2 by FDA under an Emergency Use Authorization (EUA). This EUA will remain  in effect (meaning this test can be used) for the duration of the COVID-19 declaration under Section 564(b)(1) of the Act, 21 U.S.C.section 360bbb-3(b)(1), unless the authorization is terminated  or revoked sooner.       Influenza A by PCR NEGATIVE NEGATIVE Final   Influenza B by PCR NEGATIVE NEGATIVE Final    Comment: (NOTE) The Xpert Xpress SARS-CoV-2/FLU/RSV plus assay is intended as an aid in the diagnosis of influenza from Nasopharyngeal swab specimens and should not be used as a sole basis for treatment. Nasal washings and aspirates are unacceptable for Xpert Xpress SARS-CoV-2/FLU/RSV testing.  Fact Sheet for  Patients: EntrepreneurPulse.com.au  Fact Sheet for Healthcare Providers: IncredibleEmployment.be  This test is not yet approved or cleared by the [place] FDA and has been authorized for detection and/or diagnosis of SARS-CoV-2 by FDA under an Emergency Use Authorization (EUA). This EUA will remain in effect (meaning this test can be used) for the  duration of the COVID-19 declaration under Section 564(b)(1) of the Act, 21 U.S.C. section 360bbb-3(b)(1), unless the authorization is terminated or revoked.     Resp Syncytial Virus by PCR NEGATIVE NEGATIVE Final    Comment: (NOTE) Fact Sheet for Patients: EntrepreneurPulse.com.au  Fact Sheet for Healthcare Providers: IncredibleEmployment.be  This test is not yet approved or cleared by the [place] FDA and has been authorized for detection and/or diagnosis of SARS-CoV-2 by FDA under an Emergency Use Authorization (EUA). This EUA will remain in effect (meaning this test can be used) for the duration of the COVID-19 declaration under Section 564(b)(1) of the Act, 21 U.S.C. section 360bbb-3(b)(1), unless the authorization is terminated or revoked.  Performed at [facility], [address] [place], [place] [address]   MRSA Next Gen by PCR, Nasal     Status: None   Collection Time: 01/23/22  8:35 AM   Specimen: Nasal Mucosa; Nasal Swab  Result Value Ref Range Status   MRSA by PCR Next Gen NOT DETECTED NOT DETECTED Final    Comment: (NOTE) The GeneXpert MRSA Assay (FDA approved for NASAL specimens only), is one component of a comprehensive MRSA colonization surveillance program. It is not intended to diagnose MRSA infection nor to guide or monitor treatment for MRSA infections. Test performance is not FDA approved in patients less than 12 years old. Performed at [facility], [address] [place], [place] [address]       Labs: BNP (last 3 results) No results for input(s): "BNP" in the last 8760 hours. Basic Metabolic Panel: Recent Labs  Lab 01/23/22 0652 01/24/22 0629 01/24/22 0636 01/25/22 0415  NA 140  --  141 140  K 3.7  --  4.1 4.0  CL 111  --  112* 110  CO2 24  --  24 23  GLUCOSE 177*  --  102* 145*  BUN 20  --  18 25*  CREATININE 0.85  --  0.88 0.94  CALCIUM 9.0  --  9.1 9.1  MG  --  2.4  --  2.5*  PHOS  --   --   --  3.5   Liver Function Tests: Recent Labs  Lab 01/23/22 0652  AST 19  ALT 14  ALKPHOS 70  BILITOT 0.8  PROT 6.9  ALBUMIN 4.0   No results for input(s): "LIPASE", "AMYLASE" in the last 168 hours. No results for input(s): "AMMONIA" in the last 168 hours. CBC: Recent Labs  Lab 01/23/22 0652 01/24/22 0636 01/25/22 0415  WBC 8.0 8.3 7.5  NEUTROABS 5.1  --   --   HGB 13.2 11.8* 11.9*  HCT 41.2 36.1 36.5  MCV 89.8 87.8 87.7  PLT 372 295 287   Cardiac Enzymes: No results for input(s): "CKTOTAL", "CKMB", "CKMBINDEX", "TROPONINI" in the last 168 hours. BNP: Invalid input(s): "POCBNP" CBG: Recent Labs  Lab 01/23/22 0824  GLUCAP 126*   D-Dimer No results for input(s): "DDIMER" in the last 72 hours. Hgb A1c Recent Labs    01/23/22 0652  HGBA1C 6.2*   Lipid Profile Recent Labs    01/23/22 0652  CHOL 258*  HDL 44  LDLCALC 161*  TRIG 267*  CHOLHDL 5.9   Thyroid function studies No results for input(s): "TSH", "T4TOTAL", "T3FREE", "THYROIDAB" in the last 72 hours.  Invalid input(s): "FREET3" Anemia work up No results for input(s): "VITAMINB12", "FOLATE", "FERRITIN", "TIBC", "IRON", "RETICCTPCT" in the last 72 hours. Urinalysis    Component Value Date/Time   COLORURINE YELLOW (A) 06/26/2020  [place] (A) 06/26/2020 1359   APPEARANCEUR [place] 06/24/2011 1709   LABSPEC 1.012 06/26/2020 1359   LABSPEC 1.010 06/24/2011 1709   PHURINE 6.0 06/26/2020 1359   GLUCOSEU NEGATIVE 06/26/2020 1359   GLUCOSEU Negative 06/24/2011 1709    HGBUR NEGATIVE 06/26/2020 [place] 06/26/2020 1359   BILIRUBINUR Negative 06/24/2011 [place] 06/26/2020 1359   PROTEINUR NEGATIVE 06/26/2020 1359   NITRITE NEGATIVE 06/26/2020 1359   LEUKOCYTESUR MODERATE (A) 06/26/2020 1359   LEUKOCYTESUR 1+ 06/24/2011 1709   Sepsis Labs Recent Labs  Lab 01/23/22 0652 01/24/22 0636 01/25/22 0415  WBC 8.0 8.3 7.5   Microbiology Recent Results (from the past 240 hour(s))  Resp panel by RT-PCR (RSV, Flu A&B, Covid) Anterior Nasal Swab     Status: None   Collection Time: 01/23/22  6:52 AM   Specimen: Anterior Nasal Swab  Result Value Ref Range Status   SARS Coronavirus 2 by RT PCR NEGATIVE NEGATIVE Final    Comment: (NOTE) SARS-CoV-2 target nucleic acids are NOT DETECTED.  The SARS-CoV-2 RNA is generally detectable in upper respiratory specimens during the acute phase of infection. The lowest concentration of SARS-CoV-2 viral copies this assay can detect is 138 copies/mL. A negative result does not preclude SARS-Cov-2 infection and should not be used as the sole basis for treatment or other patient management decisions. A negative result may occur with  improper specimen collection/handling, submission of specimen other than nasopharyngeal swab, presence of viral mutation(s) within the areas targeted by this assay, and inadequate number of viral copies(<138 copies/mL). A negative result must be combined with clinical observations, patient history, and epidemiological information. The expected result is Negative.  Fact Sheet for Patients:  EntrepreneurPulse.com.au  Fact Sheet for Healthcare Providers:  IncredibleEmployment.be  This test is no t yet approved or cleared by the [place] FDA and  has been authorized for detection and/or diagnosis of SARS-CoV-2 by FDA under an Emergency Use Authorization (EUA). This EUA will remain  in effect (meaning this test can be  used) for the duration of the COVID-19 declaration under Section 564(b)(1) of the Act, 21 U.S.C.section 360bbb-3(b)(1), unless the authorization is terminated  or revoked sooner.       Influenza A by PCR NEGATIVE NEGATIVE Final   Influenza B by PCR NEGATIVE NEGATIVE Final    Comment: (NOTE) The Xpert Xpress SARS-CoV-2/FLU/RSV plus assay is intended as an aid in the diagnosis of influenza from Nasopharyngeal swab specimens and should not be used as a sole basis for treatment. Nasal washings and aspirates are unacceptable for Xpert Xpress SARS-CoV-2/FLU/RSV testing.  Fact Sheet for Patients: EntrepreneurPulse.com.au  Fact Sheet for Healthcare Providers: IncredibleEmployment.be  This test is not yet approved or cleared by the [place] FDA and has been authorized for detection and/or diagnosis of SARS-CoV-2 by FDA under an Emergency Use Authorization (EUA). This EUA will remain in effect (meaning this test can be used) for the duration of the COVID-19 declaration under Section 564(b)(1) of the Act, 21 U.S.C. section 360bbb-3(b)(1), unless the authorization is terminated or revoked.     Resp Syncytial Virus by PCR NEGATIVE NEGATIVE Final    Comment: (NOTE) Fact Sheet for Patients: EntrepreneurPulse.com.au  Fact Sheet for Healthcare Providers: IncredibleEmployment.be  This test is not yet approved or cleared by the [place] FDA and has been authorized for detection and/or diagnosis of SARS-CoV-2 by FDA under an Emergency Use Authorization (EUA). This EUA will remain in effect (meaning this  test can be used) for the duration of the COVID-19 declaration under Section 564(b)(1) of the Act, 21 U.S.C. section 360bbb-3(b)(1), unless the authorization is terminated or revoked.  Performed at [facility], [address] [place], [place] [address]   MRSA Next Gen by PCR, Nasal     Status:  None   Collection Time: 01/23/22  8:35 AM   Specimen: Nasal Mucosa; Nasal Swab  Result Value Ref Range Status   MRSA by PCR Next Gen NOT DETECTED NOT DETECTED Final    Comment: (NOTE) The GeneXpert MRSA Assay (FDA approved for NASAL specimens only), is one component of a comprehensive MRSA colonization surveillance program. It is not intended to diagnose MRSA infection nor to guide or monitor treatment for MRSA infections. Test performance is not FDA approved in patients less than 89 years old. Performed at [facility], [address], [place], [place] [address]      Time coordinating discharge: Over 30 minutes  SIGNED:   Shawna Clamp, MD  [facility] 01/25/2022, 3:54 PM Pager   If 7PM-7AM, please contact night-coverage

## 2022-01-25 NOTE — Progress Notes (Signed)
Rounding Note    Patient Name: Connie Cameron Date of Encounter: 01/25/2022  [place] Cardiologist: None  New Consult done by Dr. Saunders Revel  Subjective   Patient seen on AM rounds. Denies any chest pain or shortness of breath. Endorses cough. Walked yesterday without difficulty around the ICU. No further run of NSVT or frequent PAC's noted on telemetry overnight since starting low dose metoprolol.   Inpatient Medications    Scheduled Meds:  aspirin  81 mg Oral Daily   atorvastatin  80 mg Oral Daily   enoxaparin (LOVENOX) injection  40 mg Subcutaneous Q24H   ezetimibe  10 mg Oral Daily   metoprolol succinate  12.5 mg Oral Daily   pantoprazole  40 mg Oral Daily   sertraline  100 mg Oral Daily   sodium chloride flush  3 mL Intravenous Q12H   ticagrelor  90 mg Oral BID   Continuous Infusions:  sodium chloride Stopped (01/24/22 0815)   sodium chloride     PRN Meds: sodium chloride, acetaminophen, meclizine, ondansetron (ZOFRAN) IV, mouth rinse, sodium chloride flush   Vital Signs    Vitals:   01/24/22 1704 01/24/22 2000 01/25/22 0000 01/25/22 0500  BP: 101/78 113/70 105/67 114/77  Pulse: 68 64 (!) 59 (!) 57  Resp: '20 11 16 18  '$ Temp:  98.4 F (36.9 C) 98.1 F (36.7 C) 98.3 F (36.8 C)  TempSrc:  Oral Oral Oral  SpO2: 97% 96% 95% 97%  Weight:      Height:        Intake/Output Summary (Last 24 hours) at 01/25/2022 0737 Last data filed at 01/25/2022 0600 Gross per 24 hour  Intake 1755 ml  Output 1700 ml  Net 55 ml      01/23/2022    8:24 AM 01/23/2022    6:47 AM 12/05/2021    1:13 PM  Last 3 Weights  Weight (lbs) 164 lb 3.9 oz 170 lb 168 lb  Weight (kg) 74.5 kg 77.111 kg 76.204 kg      Telemetry    Sinus brady to sinus rates of 60's - Personally Reviewed  ECG    No new tracings - Personally Reviewed  Physical Exam   GEN: No acute distress.   Neck: No JVD Cardiac: RRR, no murmurs, rubs, or gallops. Right radial cath site with mild  tenderness to palpitations, 1+ pulse, mild bruising without hematoma or bleeding, open to air Respiratory: Clear to auscultation bilaterally.Respirations are unlabored on room air. GI: Soft, nontender, non-distended  MS: No edema; No deformity. Neuro:  Nonfocal  Psych: Normal affect   Labs    High Sensitivity Troponin:   Recent Labs  Lab 01/23/22 0652 01/23/22 0848 01/25/22 0412  TROPONINIHS 9 3,204* 2,532*     Chemistry Recent Labs  Lab 01/23/22 0652 01/24/22 0629 01/24/22 0636 01/25/22 0415  NA 140  --  141 140  K 3.7  --  4.1 4.0  CL 111  --  112* 110  CO2 24  --  24 23  GLUCOSE 177*  --  102* 145*  BUN 20  --  18 25*  CREATININE 0.85  --  0.88 0.94  CALCIUM 9.0  --  9.1 9.1  MG  --  2.4  --  2.5*  PROT 6.9  --   --   --   ALBUMIN 4.0  --   --   --   AST 19  --   --   --   ALT 14  --   --   --  ALKPHOS 70  --   --   --   BILITOT 0.8  --   --   --   GFRNONAA >60  --  >60 >60  ANIONGAP 5  --  5 7    Lipids  Recent Labs  Lab 01/23/22 0652  CHOL 258*  TRIG 267*  HDL 44  LDLCALC 161*  CHOLHDL 5.9    Hematology Recent Labs  Lab 01/23/22 0652 01/24/22 0636 01/25/22 0415  WBC 8.0 8.3 7.5  RBC 4.59 4.11 4.16  HGB 13.2 11.8* 11.9*  HCT 41.2 36.1 36.5  MCV 89.8 87.8 87.7  MCH 28.8 28.7 28.6  MCHC 32.0 32.7 32.6  RDW 13.8 14.3 14.3  PLT 372 295 287   Thyroid No results for input(s): "TSH", "FREET4" in the last 168 hours.  BNPNo results for input(s): "BNP", "PROBNP" in the last 168 hours.  DDimer No results for input(s): "DDIMER" in the last 168 hours.   Radiology      Cardiac Studies   TTE 01/23/22 1. Left ventricular ejection fraction, by estimation, is 60 to 65%. The  left ventricle has normal function. The left ventricle has no regional  wall motion abnormalities. There is mild left ventricular hypertrophy.  Left ventricular diastolic parameters  were normal.   2. Right ventricular systolic function is normal. The right ventricular  size  is not well visualized.   3. The mitral valve is normal in structure. Mild mitral valve  regurgitation.   4. The aortic valve is tricuspid. Aortic valve regurgitation is not  visualized. Aortic valve sclerosis is present, with no evidence of aortic  valve stenosis.    LHC 01/23/2022 Severe single-vessel coronary artery disease with 99% mid RCA stenosis, likely due to acute plaque rupture, with TIMI-2 flow.  There is also a hazy 50-60% proximal RCA stenosis as well as moderate to severe but not critical left coronary artery disease as detailed below. Normal left ventricular filling pressure (LVEDP 12 mmHg). Successful PCI to proximal and mid RCA stenoses using nonoverlapping Onyx Frontier 3.0 x 12 mm (proximal) and 2.75 x 18 mm (mid) drug-eluting stents with 0% residual stenosis and TIMI-3 flow.  Jailed RV marginal branch arising from the mid RCA demonstrates 70% stenosis pre-PCI, increased to 90% plaque shift.  There is TIMI-2 flow at the end of the procedure through this small branch, which is too small for intervention.   Recommendations: Admit to ICU for post STEMI/PCI monitoring. Dual antiplatelet therapy with aspirin and ticagrelor for at least 12 months. Aggressive secondary prevention of coronary artery disease. Obtain echocardiogram. Favor medical therapy of the left coronary artery disease, as the most severe stenoses involve small/distal branches. Diagnostic Dominance: Right  Intervention     Patient Profile     61 y.o. female with a history of head and neck cancer, who is being seen and evaluated for chest pain, who was found to have an inferior STEMI and underwent emergency heart catheterization on 01/23/22.  Assessment & Plan    Inferior STEMI -presented with chest pain taken for emergent cardiac catheterization -HS troponins peaked at 3204 -successful PCI/DES to the proximal and mid RCA -continued on ASA 81 mg daily and tricagrelor 90 mg bid, recommended for at least  12 months -continued on statin therapy and Toprol XL 12.5 mg daily -cardiac rehab referral, program explained to patient who is in agreement to participate in program   -recommend keeping potassium 4 or better and Mg 2 or better -echocardiogram revealed normal EF with  no RWMA -remains chest pain free this morning -OOB to chair and ambulate to ensure no episodes of chest pain  Hyperlipidemia -LDL 161 -continued on atorvastatin 80 mg daily and zetia 10 mg daily -PTA dose of 20 mg daily -repeat lipid and hepatic panel in 6-8 weeks -goal of 70 or less -will consider adding PCSK9i at follow up  Nonsustained VT -no reoccurrence noted on bedside cardiac monitor -electrolytes WNL -continued on Toprol XL 12.5 mg daily   For questions or updates, please contact [place] Please consult [email or web address] for contact info under        Signed, Deone Leifheit, NP  01/25/2022, 7:37 AM

## 2022-01-25 NOTE — Discharge Instructions (Signed)
Advised to follow-up with primary care physician in 1 week. Advised to follow-up with cardiology as scheduled. Advised to take aspirin and Brilinta for at least 1 year following PTCA. Advised to take Lipitor 80 mg daily for hyperlipidemia.

## 2022-01-25 NOTE — Plan of Care (Signed)

## 2022-01-30 ENCOUNTER — Encounter: Payer: Self-pay | Admitting: Hematology and Oncology

## 2022-01-30 ENCOUNTER — Encounter: Payer: Self-pay | Admitting: Oncology

## 2022-01-30 DIAGNOSIS — Z9889 Other specified postprocedural states: Secondary | ICD-10-CM | POA: Insufficient documentation

## 2022-02-05 ENCOUNTER — Encounter: Payer: Medicare Other | Attending: Internal Medicine | Admitting: *Deleted

## 2022-02-05 DIAGNOSIS — Z955 Presence of coronary angioplasty implant and graft: Secondary | ICD-10-CM

## 2022-02-05 DIAGNOSIS — I213 ST elevation (STEMI) myocardial infarction of unspecified site: Secondary | ICD-10-CM | POA: Insufficient documentation

## 2022-02-05 NOTE — Progress Notes (Signed)
Initial phone call completed. Diagnosis can be found in [facility] 12/20. EP Orientation scheduled for Thursday 1/4 at 2pm.

## 2022-02-07 VITALS — Ht 62.2 in | Wt 164.1 lb

## 2022-02-07 DIAGNOSIS — Z955 Presence of coronary angioplasty implant and graft: Secondary | ICD-10-CM

## 2022-02-07 DIAGNOSIS — I213 ST elevation (STEMI) myocardial infarction of unspecified site: Secondary | ICD-10-CM | POA: Diagnosis present

## 2022-02-07 NOTE — Patient Instructions (Signed)
Patient Instructions  Patient Details  Name: Connie Cameron MRN: [id number] Date of Birth: 01-18-1961 Referring Provider:  Nelva Bush, MD  Below are your personal goals for exercise, nutrition, and risk factors. Our goal is to help you stay on track towards obtaining and maintaining these goals. We will be discussing your progress on these goals with you throughout the program.  Initial Exercise Prescription:  Initial Exercise Prescription - 02/07/22 1500       Date of Initial Exercise RX and Referring Provider   Date 02/07/22    Referring Provider End, Harrell Gave MD      Oxygen   Maintain Oxygen Saturation 88% or higher      Treadmill   MPH 2.5    Grade 0.5    Minutes 15    METs 3.09      NuStep   Level 3    SPM 80    Minutes 15    METs 3.18      REL-XR   Level 2    Speed 50    Minutes 15    METs 3.18      Prescription Details   Frequency (times per week) 3    Duration Progress to 30 minutes of continuous aerobic without signs/symptoms of physical distress      Intensity   THRR 40-80% of Max Heartrate 106 - 141    Ratings of Perceived Exertion 11-13    Perceived Dyspnea 0-4      Progression   Progression Continue to progress workloads to maintain intensity without signs/symptoms of physical distress.      Resistance Training   Training Prescription Yes    Weight 4 lb    Reps 10-15             Exercise Goals: Frequency: Be able to perform aerobic exercise two to three times per week in program working toward 2-5 days per week of home exercise.  Intensity: Work with a perceived exertion of 11 (fairly light) - 15 (hard) while following your exercise prescription.  We will make changes to your prescription with you as you progress through the program.   Duration: Be able to do 30 to 45 minutes of continuous aerobic exercise in addition to a 5 minute warm-up and a 5 minute cool-down routine.   Nutrition Goals: Your personal nutrition goals will  be established when you do your nutrition analysis with the dietician.  The following are general nutrition guidelines to follow: Cholesterol < '200mg'$ /day Sodium < '1500mg'$ /day Fiber: Women over 50 yrs - 21 grams per day  Personal Goals:  Personal Goals and Risk Factors at Admission - 02/07/22 1551       Core Components/Risk Factors/Patient Goals on Admission    Weight Management Yes;Weight Loss    Intervention Weight Management: Develop a combined nutrition and exercise program designed to reach desired caloric intake, while maintaining appropriate intake of nutrient and fiber, sodium and fats, and appropriate energy expenditure required for the weight goal.;Weight Management: Provide education and appropriate resources to help participant work on and attain dietary goals.;Weight Management/Obesity: Establish reasonable short term and long term weight goals.    Admit Weight 164 lb (74.4 kg)    Goal Weight: Short Term 160 lb (72.6 kg)    Goal Weight: Long Term 155 lb (70.3 kg)    Expected Outcomes Short Term: Continue to assess and modify interventions until short term weight is achieved;Long Term: Adherence to nutrition and physical activity/exercise program aimed toward attainment of established  weight goal;Weight Loss: Understanding of general recommendations for a balanced deficit meal plan, which promotes 1-2 lb weight loss per week and includes a negative energy balance of [phone] kcal/d;Understanding recommendations for meals to include 15-35% energy as protein, 25-35% energy from fat, 35-60% energy from carbohydrates, less than '200mg'$  of dietary cholesterol, 20-35 gm of total fiber daily;Understanding of distribution of calorie intake throughout the day with the consumption of 4-5 meals/snacks    Lipids Yes    Intervention Provide education and support for participant on nutrition & aerobic/resistive exercise along with prescribed medications to achieve LDL '70mg'$ , HDL >'40mg'$ .    Expected  Outcomes Short Term: Participant states understanding of desired cholesterol values and is compliant with medications prescribed. Participant is following exercise prescription and nutrition guidelines.;Long Term: Cholesterol controlled with medications as prescribed, with individualized exercise RX and with personalized nutrition plan. Value goals: LDL < '70mg'$ , HDL > 40 mg.             Tobacco Use Initial Evaluation: Social History   Tobacco Use  Smoking Status Never  Smokeless Tobacco Never    Exercise Goals and Review:  Exercise Goals     Row Name 02/07/22 1549             Exercise Goals   Increase Physical Activity Yes       Intervention Provide advice, education, support and counseling about physical activity/exercise needs.;Develop an individualized exercise prescription for aerobic and resistive training based on initial evaluation findings, risk stratification, comorbidities and participant's personal goals.       Expected Outcomes Short Term: Attend rehab on a regular basis to increase amount of physical activity.;Long Term: Add in home exercise to make exercise part of routine and to increase amount of physical activity.;Long Term: Exercising regularly at least 3-5 days a week.       Increase Strength and Stamina Yes       Intervention Provide advice, education, support and counseling about physical activity/exercise needs.;Develop an individualized exercise prescription for aerobic and resistive training based on initial evaluation findings, risk stratification, comorbidities and participant's personal goals.       Expected Outcomes Short Term: Increase workloads from initial exercise prescription for resistance, speed, and METs.;Short Term: Perform resistance training exercises routinely during rehab and add in resistance training at home;Long Term: Improve cardiorespiratory fitness, muscular endurance and strength as measured by increased METs and functional capacity (6MWT)        Able to understand and use rate of perceived exertion (RPE) scale Yes       Intervention Provide education and explanation on how to use RPE scale       Expected Outcomes Short Term: Able to use RPE daily in rehab to express subjective intensity level;Long Term:  Able to use RPE to guide intensity level when exercising independently       Able to understand and use Dyspnea scale Yes       Intervention Provide education and explanation on how to use Dyspnea scale       Expected Outcomes Short Term: Able to use Dyspnea scale daily in rehab to express subjective sense of shortness of breath during exertion;Long Term: Able to use Dyspnea scale to guide intensity level when exercising independently       Knowledge and understanding of Target Heart Rate Range (THRR) Yes       Intervention Provide education and explanation of THRR including how the numbers were predicted and where they are located for reference  Expected Outcomes Short Term: Able to state/look up THRR;Long Term: Able to use THRR to govern intensity when exercising independently;Short Term: Able to use daily as guideline for intensity in rehab       Able to check pulse independently Yes       Intervention Provide education and demonstration on how to check pulse in carotid and radial arteries.;Review the importance of being able to check your own pulse for safety during independent exercise       Expected Outcomes Long Term: Able to check pulse independently and accurately;Short Term: Able to explain why pulse checking is important during independent exercise       Understanding of Exercise Prescription Yes       Intervention Provide education, explanation, and written materials on patient's individual exercise prescription       Expected Outcomes Short Term: Able to explain program exercise prescription;Long Term: Able to explain home exercise prescription to exercise independently                Copy of goals given to  participant.

## 2022-02-07 NOTE — Progress Notes (Signed)
Cardiac Individual Treatment Plan  Patient Details  Name: LORIE CLECKLEY MRN: [id number] Date of Birth: 1960-09-19 Referring Provider:   Flowsheet Row Cardiac Rehab from 02/07/2022 in [facility] Cardiac and Pulmonary Rehab  Referring Provider End, Harrell Gave MD       Initial Encounter Date:  Flowsheet Row Cardiac Rehab from 02/07/2022 in [facility] Cardiac and Pulmonary Rehab  Date 02/07/22       Visit Diagnosis: ST elevation myocardial infarction (STEMI), unspecified artery [facility])  Status post coronary artery stent placement  Patient's Home Medications on Admission:  Current Outpatient Medications:    aspirin 81 MG chewable tablet, Chew 1 tablet (81 mg total) by mouth daily., Disp: 30 tablet, Rfl: 1   atorvastatin (LIPITOR) 80 MG tablet, Take 1 tablet (80 mg total) by mouth daily., Disp: 30 tablet, Rfl: 1   baclofen (LIORESAL) 10 MG tablet, Take 10 mg by mouth at bedtime as needed., Disp: , Rfl:    ezetimibe (ZETIA) 10 MG tablet, Take 1 tablet (10 mg total) by mouth daily., Disp: 30 tablet, Rfl: 1   gabapentin (NEURONTIN) 300 MG capsule, Take 300 mg by mouth 4 (four) times daily., Disp: , Rfl:    hydrOXYzine (ATARAX/VISTARIL) 25 MG tablet, Take 25 mg by mouth daily as needed for nausea, vomiting or anxiety., Disp: , Rfl:    meclizine (ANTIVERT) 12.5 MG tablet, Take 1 tablet (12.5 mg total) by mouth 3 (three) times daily as needed for dizziness or nausea., Disp: 30 tablet, Rfl: 0   metoprolol succinate (TOPROL-XL) 25 MG 24 hr tablet, Take 1 tablet (25 mg total) by mouth daily. Take with or immediately following a meal., Disp: 30 tablet, Rfl: 1   pantoprazole (PROTONIX) 40 MG tablet, Take 1 tablet (40 mg total) by mouth daily., Disp: 30 tablet, Rfl: 1   sertraline (ZOLOFT) 100 MG tablet, Take 100 mg by mouth daily., Disp: , Rfl:    ticagrelor (BRILINTA) 90 MG TABS tablet, Take 1 tablet (90 mg total) by mouth 2 (two) times daily., Disp: 60 tablet, Rfl: 3   Vitamin D, Ergocalciferol, (DRISDOL) 1.25  MG (50000 UNIT) CAPS capsule, Take 50,000 Units by mouth once a week., Disp: , Rfl:   Past Medical History: Past Medical History:  Diagnosis Date   Anxiety    Depression    GERD (gastroesophageal reflux disease)    Throat cancer ([place]) 2021   Tongue cancer ([place])     Tobacco Use: Social History   Tobacco Use  Smoking Status Never  Smokeless Tobacco Never    Labs: Review Flowsheet       Latest Ref Rng & Units 01/23/2022  Labs for ITP Cardiac and Pulmonary Rehab  Cholestrol 0 - 200 mg/dL 258   LDL (calc) 0 - 99 mg/dL 161   HDL-C >40 mg/dL 44   Trlycerides <150 mg/dL 267   Hemoglobin A1c 4.8 - 5.6 % 6.2      Exercise Target Goals: Exercise Program Goal: Individual exercise prescription set using results from initial 6 min walk test and THRR while considering  patient's activity barriers and safety.   Exercise Prescription Goal: Initial exercise prescription builds to 30-45 minutes a day of aerobic activity, 2-3 days per week.  Home exercise guidelines will be given to patient during program as part of exercise prescription that the participant will acknowledge.   Education: Aerobic Exercise: - Group verbal and visual presentation on the components of exercise prescription. Introduces F.I.T.T principle from ACSM for exercise prescriptions.  Reviews F.I.T.T. principles of aerobic  exercise including progression. Written material given at graduation. Flowsheet Row Cardiac Rehab from 02/07/2022 in [facility] Cardiac and Pulmonary Rehab  Education need identified 02/07/22       Education: Resistance Exercise: - Group verbal and visual presentation on the components of exercise prescription. Introduces F.I.T.T principle from ACSM for exercise prescriptions  Reviews F.I.T.T. principles of resistance exercise including progression. Written material given at graduation. Flowsheet Row Cardiac Rehab from 02/07/2022 in [facility] Cardiac and Pulmonary Rehab  Education need identified 02/07/22         Education: Exercise & Equipment Safety: - Individual verbal instruction and demonstration of equipment use and safety with use of the equipment. Flowsheet Row Cardiac Rehab from 02/07/2022 in [facility] Cardiac and Pulmonary Rehab  Education need identified 02/07/22  Date 02/07/22  Educator KW  Instruction Review Code 1- Verbalizes Understanding       Education: Exercise Physiology & General Exercise Guidelines: - Group verbal and written instruction with models to review the exercise physiology of the cardiovascular system and associated critical values. Provides general exercise guidelines with specific guidelines to those with heart or lung disease.  Flowsheet Row Cardiac Rehab from 02/07/2022 in [facility] Cardiac and Pulmonary Rehab  Education need identified 02/07/22       Education: Flexibility, Balance, Mind/Body Relaxation: - Group verbal and visual presentation with interactive activity on the components of exercise prescription. Introduces F.I.T.T principle from ACSM for exercise prescriptions. Reviews F.I.T.T. principles of flexibility and balance exercise training including progression. Also discusses the mind body connection.  Reviews various relaxation techniques to help reduce and manage stress (i.e. Deep breathing, progressive muscle relaxation, and visualization). Balance handout provided to take home. Written material given at graduation.   Activity Barriers & Risk Stratification:  Activity Barriers & Cardiac Risk Stratification - 02/07/22 1544       Activity Barriers & Cardiac Risk Stratification   Activity Barriers Joint Problems;Muscular Weakness   Right knee scope completed 01/17/22   Cardiac Risk Stratification Moderate             6 Minute Walk:  6 Minute Walk     Row Name 02/07/22 1545         6 Minute Walk   Phase Initial     Distance 1345 feet     Walk Time 6 minutes     # of Rest Breaks 0     MPH 2.54     METS 3.18     RPE 11     Perceived  Dyspnea  1     VO2 Peak 11.16     Symptoms No     Resting HR 72 bpm     Resting BP 102/66     Resting Oxygen Saturation  99 %     Exercise Oxygen Saturation  during 6 min walk 96 %     Max Ex. HR 98 bpm     Max Ex. BP 124/64     2 Minute Post BP 118/68              Oxygen Initial Assessment:   Oxygen Re-Evaluation:   Oxygen Discharge (Final Oxygen Re-Evaluation):   Initial Exercise Prescription:  Initial Exercise Prescription - 02/07/22 1500       Date of Initial Exercise RX and Referring Provider   Date 02/07/22    Referring Provider End, Harrell Gave MD      Oxygen   Maintain Oxygen Saturation 88% or higher      Treadmill   MPH 2.5  Grade 0.5    Minutes 15    METs 3.09      NuStep   Level 3    SPM 80    Minutes 15    METs 3.18      REL-XR   Level 2    Speed 50    Minutes 15    METs 3.18      Prescription Details   Frequency (times per week) 3    Duration Progress to 30 minutes of continuous aerobic without signs/symptoms of physical distress      Intensity   THRR 40-80% of Max Heartrate 106 - 141    Ratings of Perceived Exertion 11-13    Perceived Dyspnea 0-4      Progression   Progression Continue to progress workloads to maintain intensity without signs/symptoms of physical distress.      Resistance Training   Training Prescription Yes    Weight 4 lb    Reps 10-15             Perform Capillary Blood Glucose checks as needed.  Exercise Prescription Changes:   Exercise Prescription Changes     Row Name 02/07/22 1500             Response to Exercise   Blood Pressure (Admit) 102/66       Blood Pressure (Exercise) 124/64       Blood Pressure (Exit) 118/68       Heart Rate (Admit) 72 bpm       Heart Rate (Exercise) 98 bpm       Heart Rate (Exit) 71 bpm       Oxygen Saturation (Admit) 98 %       Oxygen Saturation (Exercise) 96 %       Rating of Perceived Exertion (Exercise) 11       Perceived Dyspnea (Exercise) 1        Symptoms none       Comments walk test results                Exercise Comments:   Exercise Goals and Review:   Exercise Goals     Row Name 02/07/22 1549             Exercise Goals   Increase Physical Activity Yes       Intervention Provide advice, education, support and counseling about physical activity/exercise needs.;Develop an individualized exercise prescription for aerobic and resistive training based on initial evaluation findings, risk stratification, comorbidities and participant's personal goals.       Expected Outcomes Short Term: Attend rehab on a regular basis to increase amount of physical activity.;Long Term: Add in home exercise to make exercise part of routine and to increase amount of physical activity.;Long Term: Exercising regularly at least 3-5 days a week.       Increase Strength and Stamina Yes       Intervention Provide advice, education, support and counseling about physical activity/exercise needs.;Develop an individualized exercise prescription for aerobic and resistive training based on initial evaluation findings, risk stratification, comorbidities and participant's personal goals.       Expected Outcomes Short Term: Increase workloads from initial exercise prescription for resistance, speed, and METs.;Short Term: Perform resistance training exercises routinely during rehab and add in resistance training at home;Long Term: Improve cardiorespiratory fitness, muscular endurance and strength as measured by increased METs and functional capacity (6MWT)       Able to understand and use rate of perceived exertion (RPE) scale  Yes       Intervention Provide education and explanation on how to use RPE scale       Expected Outcomes Short Term: Able to use RPE daily in rehab to express subjective intensity level;Long Term:  Able to use RPE to guide intensity level when exercising independently       Able to understand and use Dyspnea scale Yes       Intervention  Provide education and explanation on how to use Dyspnea scale       Expected Outcomes Short Term: Able to use Dyspnea scale daily in rehab to express subjective sense of shortness of breath during exertion;Long Term: Able to use Dyspnea scale to guide intensity level when exercising independently       Knowledge and understanding of Target Heart Rate Range (THRR) Yes       Intervention Provide education and explanation of THRR including how the numbers were predicted and where they are located for reference       Expected Outcomes Short Term: Able to state/look up THRR;Long Term: Able to use THRR to govern intensity when exercising independently;Short Term: Able to use daily as guideline for intensity in rehab       Able to check pulse independently Yes       Intervention Provide education and demonstration on how to check pulse in carotid and radial arteries.;Review the importance of being able to check your own pulse for safety during independent exercise       Expected Outcomes Long Term: Able to check pulse independently and accurately;Short Term: Able to explain why pulse checking is important during independent exercise       Understanding of Exercise Prescription Yes       Intervention Provide education, explanation, and written materials on patient's individual exercise prescription       Expected Outcomes Short Term: Able to explain program exercise prescription;Long Term: Able to explain home exercise prescription to exercise independently                Exercise Goals Re-Evaluation :   Discharge Exercise Prescription (Final Exercise Prescription Changes):  Exercise Prescription Changes - 02/07/22 1500       Response to Exercise   Blood Pressure (Admit) 102/66    Blood Pressure (Exercise) 124/64    Blood Pressure (Exit) 118/68    Heart Rate (Admit) 72 bpm    Heart Rate (Exercise) 98 bpm    Heart Rate (Exit) 71 bpm    Oxygen Saturation (Admit) 98 %    Oxygen Saturation  (Exercise) 96 %    Rating of Perceived Exertion (Exercise) 11    Perceived Dyspnea (Exercise) 1    Symptoms none    Comments walk test results             Nutrition:  Target Goals: Understanding of nutrition guidelines, daily intake of sodium '1500mg'$ , cholesterol '200mg'$ , calories 30% from fat and 7% or less from saturated fats, daily to have 5 or more servings of fruits and vegetables.  Education: All About Nutrition: -Group instruction provided by verbal, written material, interactive activities, discussions, models, and posters to present general guidelines for heart healthy nutrition including fat, fiber, MyPlate, the role of sodium in heart healthy nutrition, utilization of the nutrition label, and utilization of this knowledge for meal planning. Follow up email sent as well. Written material given at graduation. Flowsheet Row Cardiac Rehab from 02/07/2022 in [facility] Cardiac and Pulmonary Rehab  Education need identified 02/07/22  Biometrics:  Pre Biometrics - 02/07/22 1545       Pre Biometrics   Height 5' 2.2" (1.58 m)    Weight 164 lb 1.6 oz (74.4 kg)    Waist Circumference 41.5 inches    Hip Circumference 43.5 inches    Waist to Hip Ratio 0.95 %    BMI (Calculated) 29.82    Single Leg Stand 5.15 seconds              Nutrition Therapy Plan and Nutrition Goals:  Nutrition Therapy & Goals - 02/07/22 1553       Intervention Plan   Intervention Prescribe, educate and counsel regarding individualized specific dietary modifications aiming towards targeted core components such as weight, hypertension, lipid management, diabetes, heart failure and other comorbidities.    Expected Outcomes Short Term Goal: Understand basic principles of dietary content, such as calories, fat, sodium, cholesterol and nutrients.;Short Term Goal: A plan has been developed with personal nutrition goals set during dietitian appointment.;Long Term Goal: Adherence to prescribed nutrition plan.              Nutrition Assessments:  MEDIFICTS Score Key: ?70 Need to make dietary changes  40-70 Heart Healthy Diet ? 40 Therapeutic Level Cholesterol Diet  Flowsheet Row Cardiac Rehab from 02/07/2022 in [facility] Cardiac and Pulmonary Rehab  Picture Your Plate Total Score on Admission 61      Picture Your Plate Scores: <63 Unhealthy dietary pattern with much room for improvement. 41-50 Dietary pattern unlikely to meet recommendations for good health and room for improvement. 51-60 More healthful dietary pattern, with some room for improvement.  >60 Healthy dietary pattern, although there may be some specific behaviors that could be improved.    Nutrition Goals Re-Evaluation:   Nutrition Goals Discharge (Final Nutrition Goals Re-Evaluation):   Psychosocial: Target Goals: Acknowledge presence or absence of significant depression and/or stress, maximize coping skills, provide positive support system. Participant is able to verbalize types and ability to use techniques and skills needed for reducing stress and depression.   Education: Stress, Anxiety, and Depression - Group verbal and visual presentation to define topics covered.  Reviews how body is impacted by stress, anxiety, and depression.  Also discusses healthy ways to reduce stress and to treat/manage anxiety and depression.  Written material given at graduation.   Education: Sleep Hygiene -Provides group verbal and written instruction about how sleep can affect your health.  Define sleep hygiene, discuss sleep cycles and impact of sleep habits. Review good sleep hygiene tips.    Initial Review & Psychosocial Screening:  Initial Psych Review & Screening - 02/05/22 1015       Initial Review   Current issues with History of Depression      Family Dynamics   Good Support System? Yes   son, roommate     Barriers   Psychosocial barriers to participate in program There are no identifiable barriers or psychosocial  needs.;The patient should benefit from training in stress management and relaxation.      Screening Interventions   Interventions Encouraged to exercise;Provide feedback about the scores to participant;To provide support and resources with identified psychosocial needs    Expected Outcomes Long Term Goal: Stressors or current issues are controlled or eliminated.;Short Term goal: Utilizing psychosocial counselor, staff and physician to assist with identification of specific Stressors or current issues interfering with healing process. Setting desired goal for each stressor or current issue identified.;Short Term goal: Identification and review with participant of any Quality of Life  or Depression concerns found by scoring the questionnaire.;Long Term goal: The participant improves quality of Life and PHQ9 Scores as seen by post scores and/or verbalization of changes             Quality of Life Scores:   Quality of Life - 02/07/22 1527       Quality of Life   Select Quality of Life      Quality of Life Scores   Health/Function Pre 21.5 %    Socioeconomic Pre 22.79 %    Psych/Spiritual Pre 25.29 %    Family Pre 27 %    GLOBAL Pre 23.3 %            Scores of 19 and below usually indicate a poorer quality of life in these areas.  A difference of  2-3 points is a clinically meaningful difference.  A difference of 2-3 points in the total score of the Quality of Life Index has been associated with significant improvement in overall quality of life, self-image, physical symptoms, and general health in studies assessing change in quality of life.  PHQ-9: Review Flowsheet       02/07/2022  Depression screen PHQ 2/9  Decreased Interest 0  Down, Depressed, Hopeless 0  PHQ - 2 Score 0  Altered sleeping 0  Tired, decreased energy 1  Change in appetite 0  Feeling bad or failure about yourself  0  Trouble concentrating 0  Moving slowly or fidgety/restless 0  Suicidal thoughts 0  PHQ-9  Score 1  Difficult doing work/chores Not difficult at all   Interpretation of Total Score  Total Score Depression Severity:  1-4 = Minimal depression, 5-9 = Mild depression, 10-14 = Moderate depression, 15-19 = Moderately severe depression, 20-27 = Severe depression   Psychosocial Evaluation and Intervention:  Psychosocial Evaluation - 02/05/22 1031       Psychosocial Evaluation & Interventions   Interventions Encouraged to exercise with the program and follow exercise prescription    Comments Lauriel 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 She reports no chest pain, but feeling sluggish so she is trying to rest. She is on disability after oral cancer a few years ago. She has her son and roommate as her support system. She does have a history of anxiety, depression, adn stress. She states she is doing well in that area, and after her heart attack she has decided she doesn't want to let anything stress her out any more. She is working on staying calm. She wants to come exercise and learn more about heart healthy living    Expected Outcomes Short: attend cardiac rehab for exercise and education. Long: Develop and maintain positive self care habits.    Continue Psychosocial Services  Follow up required by staff             Psychosocial Re-Evaluation:   Psychosocial Discharge (Final Psychosocial Re-Evaluation):   Vocational Rehabilitation: Provide vocational rehab assistance to qualifying candidates.   Vocational Rehab Evaluation & Intervention:  Vocational Rehab - 02/05/22 1018       Initial Vocational Rehab Evaluation & Intervention   Assessment shows need for Vocational Rehabilitation Yes             Education: Education Goals: Education classes will be provided on a variety of topics geared toward better understanding of heart health and risk factor modification. Participant will state understanding/return demonstration of topics presented as noted by  education test scores.  Learning Barriers/Preferences:  Learning Barriers/Preferences -  02/05/22 1015       Learning Barriers/Preferences   Learning Barriers None    Learning Preferences Individual Instruction             General Cardiac Education Topics:  AED/CPR: - Group verbal and written instruction with the use of models to demonstrate the basic use of the AED with the basic ABC's of resuscitation.   Anatomy and Cardiac Procedures: - Group verbal and visual presentation and models provide information about basic cardiac anatomy and function. Reviews the testing methods done to diagnose heart disease and the outcomes of the test results. Describes the treatment choices: Medical Management, Angioplasty, or Coronary Bypass Surgery for treating various heart conditions including Myocardial Infarction, Angina, Valve Disease, and Cardiac Arrhythmias.  Written material given at graduation.   Medication Safety: - Group verbal and visual instruction to review commonly prescribed medications for heart and lung disease. Reviews the medication, class of the drug, and side effects. Includes the steps to properly store meds and maintain the prescription regimen.  Written material given at graduation.   Intimacy: - Group verbal instruction through game format to discuss how heart and lung disease can affect sexual intimacy. Written material given at graduation..   Know Your Numbers and Heart Failure: - Group verbal and visual instruction to discuss disease risk factors for cardiac and pulmonary disease and treatment options.  Reviews associated critical values for Overweight/Obesity, Hypertension, Cholesterol, and Diabetes.  Discusses basics of heart failure: signs/symptoms and treatments.  Introduces Heart Failure Zone chart for action plan for heart failure.  Written material given at graduation.   Infection Prevention: - Provides verbal and written material to individual with  discussion of infection control including proper hand washing and proper equipment cleaning during exercise session. Flowsheet Row Cardiac Rehab from 02/07/2022 in [facility] Cardiac and Pulmonary Rehab  Education need identified 02/07/22  Date 02/07/22  Educator KW  Instruction Review Code 1- Verbalizes Understanding       Falls Prevention: - Provides verbal and written material to individual with discussion of falls prevention and safety. Flowsheet Row Cardiac Rehab from 02/07/2022 in [facility] Cardiac and Pulmonary Rehab  Education need identified 02/07/22  Date 02/07/22  Educator KW  Instruction Review Code 1- Verbalizes Understanding       Other: -Provides group and verbal instruction on various topics (see comments)   Knowledge Questionnaire Score:  Knowledge Questionnaire Score - 02/07/22 1527       Knowledge Questionnaire Score   Pre Score 22/26             Core Components/Risk Factors/Patient Goals at Admission:  Personal Goals and Risk Factors at Admission - 02/07/22 1551       Core Components/Risk Factors/Patient Goals on Admission    Weight Management Yes;Weight Loss    Intervention Weight Management: Develop a combined nutrition and exercise program designed to reach desired caloric intake, while maintaining appropriate intake of nutrient and fiber, sodium and fats, and appropriate energy expenditure required for the weight goal.;Weight Management: Provide education and appropriate resources to help participant work on and attain dietary goals.;Weight Management/Obesity: Establish reasonable short term and long term weight goals.    Admit Weight 164 lb (74.4 kg)    Goal Weight: Short Term 160 lb (72.6 kg)    Goal Weight: Long Term 155 lb (70.3 kg)    Expected Outcomes Short Term: Continue to assess and modify interventions until short term weight is achieved;Long Term: Adherence to nutrition and physical activity/exercise program aimed toward  attainment of established  weight goal;Weight Loss: Understanding of general recommendations for a balanced deficit meal plan, which promotes 1-2 lb weight loss per week and includes a negative energy balance of [phone] kcal/d;Understanding recommendations for meals to include 15-35% energy as protein, 25-35% energy from fat, 35-60% energy from carbohydrates, less than '200mg'$  of dietary cholesterol, 20-35 gm of total fiber daily;Understanding of distribution of calorie intake throughout the day with the consumption of 4-5 meals/snacks    Lipids Yes    Intervention Provide education and support for participant on nutrition & aerobic/resistive exercise along with prescribed medications to achieve LDL '70mg'$ , HDL >'40mg'$ .    Expected Outcomes Short Term: Participant states understanding of desired cholesterol values and is compliant with medications prescribed. Participant is following exercise prescription and nutrition guidelines.;Long Term: Cholesterol controlled with medications as prescribed, with individualized exercise RX and with personalized nutrition plan. Value goals: LDL < '70mg'$ , HDL > 40 mg.             Education:Diabetes - Individual verbal and written instruction to review signs/symptoms of diabetes, desired ranges of glucose level fasting, after meals and with exercise. Acknowledge that pre and post exercise glucose checks will be done for 3 sessions at entry of program.   Core Components/Risk Factors/Patient Goals Review:    Core Components/Risk Factors/Patient Goals at Discharge (Final Review):    ITP Comments:  ITP Comments     Row Name 02/05/22 1029 02/07/22 1522         ITP Comments Initial phone call completed. Diagnosis can be found in [facility] 12/20. EP Orientation scheduled for Thursday 1/4 at 2pm. Completed 6MWT and gym orientation. Initial ITP created and sent for review to Dr. Emily Filbert, Medical Director.               Comments: Initial ITP

## 2022-02-11 ENCOUNTER — Encounter: Payer: Medicare Other | Admitting: *Deleted

## 2022-02-11 DIAGNOSIS — Z955 Presence of coronary angioplasty implant and graft: Secondary | ICD-10-CM

## 2022-02-11 DIAGNOSIS — I213 ST elevation (STEMI) myocardial infarction of unspecified site: Secondary | ICD-10-CM

## 2022-02-11 NOTE — Progress Notes (Signed)
Daily Session Note  Patient Details  Name: Connie Cameron MRN: [id number] Date of Birth: [birth year] Referring Provider:   Flowsheet Row Cardiac Rehab from 02/07/2022 in [facility] Cardiac and Pulmonary Rehab  Referring Provider End, Harrell Gave MD       Encounter Date: 02/11/2022  Check In:  Session Check In - 02/11/22 1333       Check-In   Supervising physician immediately available to respond to emergencies See telemetry face sheet for immediately available ER MD    Location [facility]-Cardiac & Pulmonary Rehab    Staff Present Renita Papa, RN BSN;Joseph Tessie Fass, RCP,RRT,BSRT;Megan Tamala Julian, RN, [place]    Virtual Visit No    Medication changes reported     No    Fall or balance concerns reported    No    Warm-up and Cool-down Performed on first and last piece of equipment    Resistance Training Performed Yes    VAD Patient? No    PAD/SET Patient? No      Pain Assessment   Currently in Pain? No/denies                Social History   Tobacco Use  Smoking Status Never  Smokeless Tobacco Never    Goals Met:  Independence with exercise equipment Exercise tolerated well No report of concerns or symptoms today Strength training completed today  Goals Unmet:  Not Applicable  Comments: First full day of exercise!  Patient was oriented to gym and equipment including functions, settings, policies, and procedures.  Patient's individual exercise prescription and treatment plan were reviewed.  All starting workloads were established based on the results of the 6 minute walk test done at initial orientation visit.  The plan for exercise progression was also introduced and progression will be customized based on patient's performance and goals.     Dr. Emily Filbert is Medical Director for [place].  Dr. Ottie Glazier is Medical Director for [facility] Pulmonary Rehabilitation.

## 2022-02-11 NOTE — Progress Notes (Signed)
Cardiology Office Note    Date:  02/15/2022   ID:  Connie Cameron, DOB Apr 12, 1960, MRN [id number]  PCP:  [place]  Cardiologist:  Nelva Bush, MD  Electrophysiologist:  None   Chief Complaint: Hospital follow-up  History of Present Illness:   Connie Cameron is a 62 y.o. female with history of CAD with recent inferior ST elevation MI on 01/23/2022, HLD, and head and neck cancer who presents for hospital follow-up as outlined below.  She was admitted to [facility] from 12/20-12/22/2023 with an inferior ST elevation MI.  LHC showed severe single-vessel CAD with 99% mid RCA stenosis felt to likely be due to acute plaque rupture.  There was also 50 to 60% proximal RCA stenosis as well as moderate to severe, but noncritical left coronary artery disease.  She underwent successful PCI/DES to the proximal and mid RCA using nonoverlapping drug-eluting stents.  There was a jailed RV marginal branch arising from the mid RCA that demonstrated 70% stenosis of pre-PCI that increased to 90% with plaque shift.  There was TIMI II flow at the end of the procedure.  The vessel was a small branch and too small for intervention.  The left coronary artery disease was favored for medical management as the most severe stenoses involved small/distal branches.  Echo during the admission demonstrated an EF of 60 to 65%, no regional wall motion abnormalities, mild LVH, normal LV diastolic function parameters, normal RV systolic function, mild mitral regurgitation, and aortic valve sclerosis without evidence of stenosis.  High-sensitivity troponin peaked at 3204.  Post intervention there was some NSVT that resolved prior to discharge.  She comes in doing very well from a cardiac perspective and is without symptoms of angina or decompensation.  No dyspnea, palpitations, presyncope, or syncope.  She does note some mild dizziness if she stands up quickly.  She also notes some fatigue, though is more active now than she  previously was.  No lower extremity swelling, abdominal distention, or orthopnea.  No falls, hematochezia, or melena.  She is participating in cardiac rehab and enjoying this.  Adherent and tolerating cardiac medications including DAPT, without missing any doses.  Overall, she feels well and does not have any acute concerns at this time.   Labs independently reviewed: 01/2022 - potassium 4.0, BUN 25, serum creatinine 0.94, magnesium 2.5, Hgb 11.9, PLT 287, LP(a) 318, A1c 6.2, TC 258, TG 267, HDL 44, LDL 161, albumin 4.0, AST/ALT normal 11/2021 - TSH 5.494  Past Medical History:  Diagnosis Date   Anxiety    Depression    GERD (gastroesophageal reflux disease)    Throat cancer ([place]) 2021   Tongue cancer ([place])     Past Surgical History:  Procedure Laterality Date   COLONOSCOPY WITH PROPOFOL N/A 03/30/2020   Procedure: COLONOSCOPY WITH PROPOFOL;  Surgeon: Virgel Manifold, MD;  Location: [facility] ENDOSCOPY;  Service: Endoscopy;  Laterality: N/A;  COVID POSITIVE 02/29/2020   CORONARY/GRAFT ACUTE MI REVASCULARIZATION N/A 01/23/2022   Procedure: Coronary/Graft Acute MI Revascularization;  Surgeon: Nelva Bush, MD;  Location: [place] CV LAB;  Service: Cardiovascular;  Laterality: N/A;   ESOPHAGOGASTRODUODENOSCOPY (EGD) WITH PROPOFOL N/A 03/30/2020   Procedure: ESOPHAGOGASTRODUODENOSCOPY (EGD) WITH PROPOFOL;  Surgeon: Virgel Manifold, MD;  Location: [facility] ENDOSCOPY;  Service: Endoscopy;  Laterality: N/A;   ESOPHAGOGASTRODUODENOSCOPY (EGD) WITH PROPOFOL N/A 08/03/2020   Procedure: ESOPHAGOGASTRODUODENOSCOPY (EGD) WITH PROPOFOL;  Surgeon: Virgel Manifold, MD;  Location: [facility] ENDOSCOPY;  Service: Endoscopy;  Laterality: N/A;  ESOPHAGOGASTRODUODENOSCOPY (EGD) WITH PROPOFOL N/A 01/25/2021   Procedure: ESOPHAGOGASTRODUODENOSCOPY (EGD) WITH PROPOFOL;  Surgeon: Virgel Manifold, MD;  Location: [facility] ENDOSCOPY;  Service: Endoscopy;  Laterality: N/A;   ESOPHAGOGASTRODUODENOSCOPY (EGD)  WITH PROPOFOL N/A 09/05/2021   Procedure: ESOPHAGOGASTRODUODENOSCOPY (EGD) WITH PROPOFOL;  Surgeon: Lin Landsman, MD;  Location: [facility] ENDOSCOPY;  Service: Gastroenterology;  Laterality: N/A;   EXCISION MASS NECK Left 01/18/2019   Procedure: EXCISION MASS NECK/NODE;  Surgeon: Margaretha Sheffield, MD;  Location: [facility] ORS;  Service: ENT;  Laterality: Left;   IR REMOVAL TUN ACCESS W/ PORT W/O FL MOD SED  12/05/2021   LARYNGOSCOPY Bilateral 02/22/2019   Procedure: MICROSCOPIC DIRECT LARYNGOSCOPY AND BIOPSY;  Surgeon: Margaretha Sheffield, MD;  Location: [facility] ORS;  Service: ENT;  Laterality: Bilateral;   LEFT HEART CATH AND CORONARY ANGIOGRAPHY N/A 01/23/2022   Procedure: LEFT HEART CATH AND CORONARY ANGIOGRAPHY;  Surgeon: Nelva Bush, MD;  Location: [place] CV LAB;  Service: Cardiovascular;  Laterality: N/A;   PORTA CATH INSERTION N/A 03/24/2019   Procedure: PORTA CATH INSERTION;  Surgeon: Katha Cabal, MD;  Location: [place] CV LAB;  Service: Cardiovascular;  Laterality: N/A;   TONSILLECTOMY     TUBAL LIGATION      Current Medications: Current Meds  Medication Sig   aspirin 81 MG chewable tablet Chew 1 tablet (81 mg total) by mouth daily.   atorvastatin (LIPITOR) 80 MG tablet Take 1 tablet (80 mg total) by mouth daily.   baclofen (LIORESAL) 10 MG tablet Take 10 mg by mouth at bedtime as needed.   ezetimibe (ZETIA) 10 MG tablet Take 1 tablet (10 mg total) by mouth daily.   gabapentin (NEURONTIN) 300 MG capsule Take 300 mg by mouth 2 (two) times daily.   hydrOXYzine (ATARAX/VISTARIL) 25 MG tablet Take 25 mg by mouth daily as needed for nausea, vomiting or anxiety.   meclizine (ANTIVERT) 12.5 MG tablet Take 1 tablet (12.5 mg total) by mouth 3 (three) times daily as needed for dizziness or nausea.   pantoprazole (PROTONIX) 40 MG tablet Take 1 tablet (40 mg total) by mouth daily.   sertraline (ZOLOFT) 100 MG tablet Take 100 mg by mouth daily.   ticagrelor (BRILINTA) 90 MG TABS tablet  Take 1 tablet (90 mg total) by mouth 2 (two) times daily.   Vitamin D, Ergocalciferol, (DRISDOL) 1.25 MG (50000 UNIT) CAPS capsule Take 50,000 Units by mouth once a week.   [DISCONTINUED] metoprolol succinate (TOPROL-XL) 25 MG 24 hr tablet Take 1 tablet (25 mg total) by mouth daily. Take with or immediately following a meal.    Allergies:   Chlorhexidine   Social History   Socioeconomic History   Marital status: Single    Spouse name: Not on file   Number of children: Not on file   Years of education: Not on file   Highest education level: Not on file  Occupational History   Not on file  Tobacco Use   Smoking status: Never   Smokeless tobacco: Never  Vaping Use   Vaping Use: Never used  Substance and Sexual Activity   Alcohol use: Not Currently   Drug use: No   Sexual activity: Not on file  Other Topics Concern   Not on file  Social History Narrative   Not on file   Social Determinants of Health   Financial Resource Strain: Not on file  Food Insecurity: Not on file  Transportation Needs: Not on file  Physical Activity: Not on file  Stress: Not on  file  Social Connections: Not on file     Family History:  The patient's family history includes Brain cancer in her paternal grandfather; Breast cancer in her sister.  ROS:   12-point review of systems is negative unless otherwise noted in the HPI.   EKGs/Labs/Other Studies Reviewed:    Studies reviewed were summarized above. The additional studies were reviewed today:  2D echo 01/23/2022: 1. Left ventricular ejection fraction, by estimation, is 60 to 65%. The  left ventricle has normal function. The left ventricle has no regional  wall motion abnormalities. There is mild left ventricular hypertrophy.  Left ventricular diastolic parameters  were normal.   2. Right ventricular systolic function is normal. The right ventricular  size is not well visualized.   3. The mitral valve is normal in structure. Mild mitral  valve  regurgitation.   4. The aortic valve is tricuspid. Aortic valve regurgitation is not  visualized. Aortic valve sclerosis is present, with no evidence of aortic  valve stenosis.  __________  LHC 01/23/2022: Conclusions: Severe single-vessel coronary artery disease with 99% mid RCA stenosis, likely due to acute plaque rupture, with TIMI-2 flow.  There is also a hazy 50-60% proximal RCA stenosis as well as moderate to severe but not critical left coronary artery disease as detailed below. Normal left ventricular filling pressure (LVEDP 12 mmHg). Successful PCI to proximal and mid RCA stenoses using nonoverlapping Onyx Frontier 3.0 x 12 mm (proximal) and 2.75 x 18 mm (mid) drug-eluting stents with 0% residual stenosis and TIMI-3 flow.  Jailed RV marginal branch arising from the mid RCA demonstrates 70% stenosis pre-PCI, increased to 90% plaque shift.  There is TIMI-2 flow at the end of the procedure through this small branch, which is too small for intervention.   Recommendations: Admit to ICU for post STEMI/PCI monitoring. Dual antiplatelet therapy with aspirin and ticagrelor for at least 12 months. Aggressive secondary prevention of coronary artery disease. Obtain echocardiogram. Favor medical therapy of the left coronary artery disease, as the most severe stenoses involve small/distal branches.    EKG:  EKG is ordered today.  The EKG ordered today demonstrates NSR, 67 bpm, prior inferior infarct, no acute ST-T changes  Recent Labs: 11/21/2021: TSH 5.494 01/23/2022: ALT 14 01/25/2022: BUN 25; Creatinine, Ser 0.94; Hemoglobin 11.9; Magnesium 2.5; Platelets 287; Potassium 4.0; Sodium 140  Recent Lipid Panel    Component Value Date/Time   CHOL 258 (H) 01/23/2022 0652   TRIG 267 (H) 01/23/2022 0652   HDL 44 01/23/2022 0652   CHOLHDL 5.9 01/23/2022 0652   VLDL 53 (H) 01/23/2022 0652   LDLCALC 161 (H) 01/23/2022 3419    PHYSICAL EXAM:    VS:  BP 106/72 (BP Location: Left Arm,  Patient Position: Sitting, Cuff Size: Normal)   Pulse 67   Ht '5\' 2"'$  (1.575 m)   Wt 163 lb 12.8 oz (74.3 kg)   SpO2 97%   BMI 29.96 kg/m   BMI: Body mass index is 29.96 kg/m.  Physical Exam Vitals reviewed.  Constitutional:      Appearance: She is well-developed.  HENT:     Head: Normocephalic and atraumatic.  Eyes:     General:        Right eye: No discharge.        Left eye: No discharge.  Neck:     Vascular: No JVD.  Cardiovascular:     Rate and Rhythm: Normal rate and regular rhythm.     Pulses:  Posterior tibial pulses are 2+ on the right side and 2+ on the left side.     Heart sounds: Normal heart sounds, S1 normal and S2 normal. Heart sounds not distant. No midsystolic click and no opening snap. No murmur heard.    No friction rub.     Comments: Right radial arteriotomy site has healed well without bleeding, bruising, swelling, warmth, erythema, or tenderness to palpation.  Radial pulse 2+ proximal and distal to the arteriotomy site. Pulmonary:     Effort: Pulmonary effort is normal. No respiratory distress.     Breath sounds: Normal breath sounds. No decreased breath sounds, wheezing or rales.  Chest:     Chest wall: No tenderness.  Abdominal:     General: There is no distension.  Musculoskeletal:     Cervical back: Normal range of motion.     Right lower leg: No edema.     Left lower leg: No edema.  Skin:    General: Skin is warm and dry.     Nails: There is no clubbing.  Neurological:     Mental Status: She is alert and oriented to person, place, and time.  Psychiatric:        Speech: Speech normal.        Behavior: Behavior normal.        Thought Content: Thought content normal.        Judgment: Judgment normal.     Wt Readings from Last 3 Encounters:  02/15/22 163 lb 12.8 oz (74.3 kg)  02/07/22 164 lb 1.6 oz (74.4 kg)  01/23/22 164 lb 3.9 oz (74.5 kg)     ASSESSMENT & PLAN:   CAD in the native coronary arteries with recent inferior ST  elevation MI: She is doing well, without symptoms concerning for angina or decompensation.  Continue aggressive risk factor modification and secondary prevention including DAPT with aspirin and ticagrelor without interruption for a minimum of 12 months dating back to date of PCI (01/23/2022).  She will otherwise continue atorvastatin, ezetimibe, and lower dose Toprol-XL.  Continue to participate with cardiac rehab.  No indication for further ischemic testing at this time.  NSVT: Likely in the setting of acute MI.  No further ventricular ectopy noted.  HLD: LDL 161 with a triglyceride of 267 during recent admission with goal LDL being less than 55.  Now on atorvastatin 80 mg and ezetimibe.  Check fasting lipid panel and CMP in 2 months with recommendation to escalate lipid-lowering therapy as indicated to achieve target LDL.  May need to consider PCSK9 inhibitor.  Orthostasis: Decrease Toprol-XL to 12.5 mg daily.   Disposition: F/u with Dr. Saunders Revel or an APP in 2 months.   Medication Adjustments/Labs and Tests Ordered: Current medicines are reviewed at length with the patient today.  Concerns regarding medicines are outlined above. Medication changes, Labs and Tests ordered today are summarized above and listed in the Patient Instructions accessible in Encounters.   Signed, Christell Faith, PA-C 02/15/2022 4:37 PM     [facility] [address] [place] [address] [place], [place] [address] [phone]

## 2022-02-14 ENCOUNTER — Encounter: Payer: Medicare Other | Admitting: *Deleted

## 2022-02-14 DIAGNOSIS — Z955 Presence of coronary angioplasty implant and graft: Secondary | ICD-10-CM

## 2022-02-14 DIAGNOSIS — I213 ST elevation (STEMI) myocardial infarction of unspecified site: Secondary | ICD-10-CM | POA: Diagnosis not present

## 2022-02-14 NOTE — Progress Notes (Signed)
Daily Session Note  Patient Details  Name: Connie Cameron MRN: [id number] Date of Birth: 01/29/1961 Referring Provider:   Flowsheet Row Cardiac Rehab from 02/07/2022 in [facility] Cardiac and Pulmonary Rehab  Referring Provider End, Harrell Gave MD       Encounter Date: 02/14/2022  Check In:  Session Check In - 02/14/22 1332       Check-In   Supervising physician immediately available to respond to emergencies See telemetry face sheet for immediately available ER MD    Location [facility]-Cardiac & Pulmonary Rehab    Staff Present Alberteen Sam, MA, RCEP, CCRP, CCET;Kourtland Coopman Onward, [place]    Virtual Visit No    Medication changes reported     No    Fall or balance concerns reported    No    Warm-up and Cool-down Performed on first and last piece of equipment    Resistance Training Performed Yes    VAD Patient? No      Pain Assessment   Currently in Pain? No/denies    Multiple Pain Sites No                Social History   Tobacco Use  Smoking Status Never  Smokeless Tobacco Never    Goals Met:  Proper associated with RPD/PD & O2 Sat Exercise tolerated well No report of concerns or symptoms today Strength training completed today  Goals Unmet:  Not Applicable  Comments: Pt able to follow exercise prescription today without complaint.  Will continue to monitor for progression.Pt able to follow exercise prescription today without complaint.  Will continue to monitor for progression.    Dr. Emily Filbert is Medical Director for [place].  Dr. Ottie Glazier is Medical Director for [facility] Pulmonary Rehabilitation.

## 2022-02-15 ENCOUNTER — Encounter: Payer: Self-pay | Admitting: Physician Assistant

## 2022-02-15 ENCOUNTER — Ambulatory Visit: Payer: Medicare Other | Attending: Physician Assistant | Admitting: Physician Assistant

## 2022-02-15 VITALS — BP 106/72 | HR 67 | Ht 62.0 in | Wt 163.8 lb

## 2022-02-15 DIAGNOSIS — I4729 Other ventricular tachycardia: Secondary | ICD-10-CM | POA: Diagnosis present

## 2022-02-15 DIAGNOSIS — E785 Hyperlipidemia, unspecified: Secondary | ICD-10-CM | POA: Diagnosis not present

## 2022-02-15 DIAGNOSIS — I2119 ST elevation (STEMI) myocardial infarction involving other coronary artery of inferior wall: Secondary | ICD-10-CM | POA: Diagnosis not present

## 2022-02-15 DIAGNOSIS — I2510 Atherosclerotic heart disease of native coronary artery without angina pectoris: Secondary | ICD-10-CM

## 2022-02-15 DIAGNOSIS — I951 Orthostatic hypotension: Secondary | ICD-10-CM | POA: Diagnosis present

## 2022-02-15 MED ORDER — METOPROLOL SUCCINATE ER 25 MG PO TB24: 12.5000 mg | ORAL_TABLET | Freq: Every day | ORAL | 3 refills | Status: DC

## 2022-02-15 NOTE — Patient Instructions (Signed)
Medication Instructions:  Your physician has recommended you make the following change in your medication:   DECREASE Metoprolol to 12.5 mg once daily   *If you need a refill on your cardiac medications before your next appointment, please call your pharmacy*   Lab Work: Lipid, Cmet, & CBC in 2 months.  If you have labs (blood work) drawn today and your tests are completely normal, you will receive your results only by: [place] (if you have MyChart) OR A paper copy in the mail If you have any lab test that is abnormal or we need to change your treatment, we will call you to review the results.   Testing/Procedures: None   Follow-Up: At [facility], you and your health needs are our priority.  As part of our continuing mission to provide you with exceptional heart care, we have created designated Provider Care Teams.  These Care Teams include your primary Cardiologist (physician) and Advanced Practice Providers (APPs -  Physician Assistants and Nurse Practitioners) who all work together to provide you with the care you need, when you need it.  We recommend signing up for the patient portal called "MyChart".  Sign up information is provided on this After Visit Summary.  MyChart is used to connect with patients for Virtual Visits (Telemedicine).  Patients are able to view lab/test results, encounter notes, upcoming appointments, etc.  Non-urgent messages can be sent to your provider as well.   To learn more about what you can do with MyChart, go to [email or web address].    Your next appointment:   March  Provider:   Christell Faith, PA-C

## 2022-02-18 ENCOUNTER — Encounter: Payer: Medicare Other | Admitting: *Deleted

## 2022-02-18 DIAGNOSIS — I213 ST elevation (STEMI) myocardial infarction of unspecified site: Secondary | ICD-10-CM

## 2022-02-18 DIAGNOSIS — Z955 Presence of coronary angioplasty implant and graft: Secondary | ICD-10-CM

## 2022-02-18 NOTE — Progress Notes (Signed)
Daily Session Note  Patient Details  Name: Connie Cameron MRN: [id number] Date of Birth: 1960-07-07 Referring Provider:   Flowsheet Row Cardiac Rehab from 02/07/2022 in [facility] Cardiac and Pulmonary Rehab  Referring Provider End, Harrell Gave MD       Encounter Date: 02/18/2022  Check In:  Session Check In - 02/18/22 1334       Check-In   Supervising physician immediately available to respond to emergencies See telemetry face sheet for immediately available ER MD    Location [facility]-Cardiac & Pulmonary Rehab    Staff Present Renita Papa, RN BSN;Joseph Tessie Fass, RCP,RRT,BSRT;Megan Tamala Julian, RN, [place]    Virtual Visit No    Medication changes reported     No    Fall or balance concerns reported    No    Warm-up and Cool-down Performed on first and last piece of equipment    Resistance Training Performed Yes    VAD Patient? No    PAD/SET Patient? No      Pain Assessment   Currently in Pain? No/denies                Social History   Tobacco Use  Smoking Status Never  Smokeless Tobacco Never    Goals Met:  Independence with exercise equipment Exercise tolerated well No report of concerns or symptoms today Strength training completed today  Goals Unmet:  Not Applicable  Comments: Pt able to follow exercise prescription today without complaint.  Will continue to monitor for progression.    Dr. Emily Filbert is Medical Director for [place].  Dr. Ottie Glazier is Medical Director for [facility] Pulmonary Rehabilitation.

## 2022-02-20 ENCOUNTER — Encounter: Payer: Medicare Other | Admitting: *Deleted

## 2022-02-20 DIAGNOSIS — I213 ST elevation (STEMI) myocardial infarction of unspecified site: Secondary | ICD-10-CM | POA: Diagnosis not present

## 2022-02-20 DIAGNOSIS — Z955 Presence of coronary angioplasty implant and graft: Secondary | ICD-10-CM

## 2022-02-20 NOTE — Progress Notes (Signed)
Daily Session Note  Patient Details  Name: Connie Cameron MRN: [id number] Date of Birth: [birth year] Referring Provider:   Flowsheet Row Cardiac Rehab from 02/07/2022 in [facility] Cardiac and Pulmonary Rehab  Referring Provider End, Harrell Gave MD       Encounter Date: 02/20/2022  Check In:  Session Check In - 02/20/22 1328       Check-In   Supervising physician immediately available to respond to emergencies See telemetry face sheet for immediately available ER MD    Location [facility]-Cardiac & Pulmonary Rehab    Staff Present Renita Papa, RN BSN;Jessica Luan Pulling, MA, RCEP, CCRP, Mindi Curling, RN, [place]    Virtual Visit No    Medication changes reported     No    Fall or balance concerns reported    No    Warm-up and Cool-down Performed on first and last piece of equipment    Resistance Training Performed Yes    VAD Patient? No    PAD/SET Patient? No      Pain Assessment   Currently in Pain? No/denies                Social History   Tobacco Use  Smoking Status Never  Smokeless Tobacco Never    Goals Met:  Independence with exercise equipment Exercise tolerated well No report of concerns or symptoms today Strength training completed today  Goals Unmet:  Not Applicable  Comments: Pt able to follow exercise prescription today without complaint.  Will continue to monitor for progression.    Dr. Emily Filbert is Medical Director for [place].  Dr. Ottie Glazier is Medical Director for [facility] Pulmonary Rehabilitation.

## 2022-02-21 ENCOUNTER — Encounter: Payer: Medicare Other | Admitting: *Deleted

## 2022-02-21 ENCOUNTER — Inpatient Hospital Stay: Payer: Medicare Other | Attending: Internal Medicine

## 2022-02-21 ENCOUNTER — Telehealth: Payer: Self-pay | Admitting: Internal Medicine

## 2022-02-21 ENCOUNTER — Inpatient Hospital Stay: Payer: Medicare Other

## 2022-02-21 DIAGNOSIS — D519 Vitamin B12 deficiency anemia, unspecified: Secondary | ICD-10-CM | POA: Diagnosis not present

## 2022-02-21 DIAGNOSIS — Z955 Presence of coronary angioplasty implant and graft: Secondary | ICD-10-CM

## 2022-02-21 DIAGNOSIS — I213 ST elevation (STEMI) myocardial infarction of unspecified site: Secondary | ICD-10-CM | POA: Diagnosis not present

## 2022-02-21 DIAGNOSIS — Z79899 Other long term (current) drug therapy: Secondary | ICD-10-CM | POA: Diagnosis not present

## 2022-02-21 DIAGNOSIS — C01 Malignant neoplasm of base of tongue: Secondary | ICD-10-CM

## 2022-02-21 DIAGNOSIS — Z08 Encounter for follow-up examination after completed treatment for malignant neoplasm: Secondary | ICD-10-CM

## 2022-02-21 DIAGNOSIS — D701 Agranulocytosis secondary to cancer chemotherapy: Secondary | ICD-10-CM

## 2022-02-21 LAB — COMPREHENSIVE METABOLIC PANEL
ALT: 15 U/L (ref 0–44)
AST: 24 U/L (ref 15–41)
Albumin: 3.9 g/dL (ref 3.5–5.0)
Alkaline Phosphatase: 84 U/L (ref 38–126)
Anion gap: 8 (ref 5–15)
BUN: 21 mg/dL (ref 8–23)
CO2: 25 mmol/L (ref 22–32)
Calcium: 9.2 mg/dL (ref 8.9–10.3)
Chloride: 106 mmol/L (ref 98–111)
Creatinine, Ser: 1.13 mg/dL — ABNORMAL HIGH (ref 0.44–1.00)
GFR, Estimated: 55 mL/min — ABNORMAL LOW (ref >60)
Glucose, Bld: 106 mg/dL — ABNORMAL HIGH (ref 70–99)
Potassium: 4.2 mmol/L (ref 3.5–5.1)
Sodium: 139 mmol/L (ref 135–145)
Total Bilirubin: 0.6 mg/dL (ref 0.3–1.2)
Total Protein: 7.4 g/dL (ref 6.5–8.1)

## 2022-02-21 LAB — CBC WITH DIFFERENTIAL/PLATELET
Abs Immature Granulocytes: 0.03 10*3/uL (ref 0.00–0.07)
Basophils Absolute: 0 10*3/uL (ref 0.0–0.1)
Basophils Relative: 1 %
Eosinophils Absolute: 0.1 10*3/uL (ref 0.0–0.5)
Eosinophils Relative: 2 %
HCT: 35.9 % — ABNORMAL LOW (ref 36.0–46.0)
Hemoglobin: 11.7 g/dL — ABNORMAL LOW (ref 12.0–15.0)
Immature Granulocytes: 1 %
Lymphocytes Relative: 13 %
Lymphs Abs: 0.8 10*3/uL (ref 0.7–4.0)
MCH: 29.3 pg (ref 26.0–34.0)
MCHC: 32.6 g/dL (ref 30.0–36.0)
MCV: 90 fL (ref 80.0–100.0)
Monocytes Absolute: 0.5 10*3/uL (ref 0.1–1.0)
Monocytes Relative: 8 %
Neutro Abs: 5.1 10*3/uL (ref 1.7–7.7)
Neutrophils Relative %: 75 %
Platelets: 233 10*3/uL (ref 150–400)
RBC: 3.99 MIL/uL (ref 3.87–5.11)
RDW: 14.6 % (ref 11.5–15.5)
WBC: 6.7 10*3/uL (ref 4.0–10.5)
nRBC: 0 % (ref 0.0–0.2)

## 2022-02-21 LAB — VITAMIN B12: Vitamin B-12: 230 pg/mL (ref 180–914)

## 2022-02-21 LAB — TSH: TSH: 3.206 u[IU]/mL (ref 0.350–4.500)

## 2022-02-21 MED ORDER — CYANOCOBALAMIN 1000 MCG/ML IJ SOLN
1000.0000 ug | Freq: Once | INTRAMUSCULAR | Status: AC
  Administered 2022-02-21: 1000 ug via INTRAMUSCULAR
  Filled 2022-02-21: qty 1

## 2022-02-21 NOTE — Telephone Encounter (Signed)
Called [facility], no requests have been sent to approve for refill.  They are currently filling new Rx for Metoprolol. Made attempt to call p/t, no answer and VW not set up.   Glean V. RMA

## 2022-02-21 NOTE — Progress Notes (Signed)
Daily Session Note  Patient Details  Name: LARYSSA HASSING MRN: [id number] Date of Birth: 1960/06/04 Referring Provider:   Flowsheet Row Cardiac Rehab from 02/07/2022 in [facility]  Referring Provider End, Harrell Gave MD       Encounter Date: 02/21/2022  Check In:  Session Check In - 02/21/22 1346       Check-In   Supervising physician immediately available to respond to emergencies See telemetry face sheet for immediately available ER MD    Location [facility]    Staff Present Darlyne Russian, RN, Lorin Mercy, MS, ACSM CEP, Exercise Physiologist;Joseph Tessie Fass, [place]    Virtual Visit No    Medication changes reported     No    Fall or balance concerns reported    No    Warm-up and Cool-down Performed on first and last piece of equipment    Resistance Training Performed Yes    VAD Patient? No    PAD/SET Patient? No      Pain Assessment   Currently in Pain? No/denies                Social History   Tobacco Use  Smoking Status Never  Smokeless Tobacco Never    Goals Met:  Independence with exercise equipment Exercise tolerated well No report of concerns or symptoms today Strength training completed today  Goals Unmet:  Not Applicable  Comments: Pt able to follow exercise prescription today without complaint.  Will continue to monitor for progression.    Dr. Emily Filbert is Medical Director for [place].  Dr. Ottie Glazier is Medical Director for [facility].

## 2022-02-21 NOTE — Telephone Encounter (Signed)
Patient came by office inquiring about refills States that pharmacy has sent request but we have not responded Patient did not know which medications Please review

## 2022-02-27 ENCOUNTER — Encounter: Payer: Self-pay | Admitting: *Deleted

## 2022-02-27 DIAGNOSIS — Z955 Presence of coronary angioplasty implant and graft: Secondary | ICD-10-CM

## 2022-02-27 DIAGNOSIS — I213 ST elevation (STEMI) myocardial infarction of unspecified site: Secondary | ICD-10-CM

## 2022-02-27 NOTE — Progress Notes (Signed)
Cardiac Individual Treatment Plan  Patient Details  Name: Connie Cameron MRN: [id number] Date of Birth: 10-21-1960 Referring Provider:   Flowsheet Row Cardiac Rehab from 02/07/2022 in [facility] Cardiac and Pulmonary Rehab  Referring Provider End, Harrell Gave MD       Initial Encounter Date:  Flowsheet Row Cardiac Rehab from 02/07/2022 in [facility] Cardiac and Pulmonary Rehab  Date 02/07/22       Visit Diagnosis: ST elevation myocardial infarction (STEMI), unspecified artery [facility])  Status post coronary artery stent placement  Patient's Home Medications on Admission:  Current Outpatient Medications:    aspirin 81 MG chewable tablet, Chew 1 tablet (81 mg total) by mouth daily., Disp: 30 tablet, Rfl: 1   atorvastatin (LIPITOR) 80 MG tablet, Take 1 tablet (80 mg total) by mouth daily., Disp: 30 tablet, Rfl: 1   baclofen (LIORESAL) 10 MG tablet, Take 10 mg by mouth at bedtime as needed., Disp: , Rfl:    ezetimibe (ZETIA) 10 MG tablet, Take 1 tablet (10 mg total) by mouth daily., Disp: 30 tablet, Rfl: 1   gabapentin (NEURONTIN) 300 MG capsule, Take 300 mg by mouth 2 (two) times daily., Disp: , Rfl:    hydrOXYzine (ATARAX/VISTARIL) 25 MG tablet, Take 25 mg by mouth daily as needed for nausea, vomiting or anxiety., Disp: , Rfl:    meclizine (ANTIVERT) 12.5 MG tablet, Take 1 tablet (12.5 mg total) by mouth 3 (three) times daily as needed for dizziness or nausea., Disp: 30 tablet, Rfl: 0   metoprolol succinate (TOPROL-XL) 25 MG 24 hr tablet, Take 0.5 tablets (12.5 mg total) by mouth daily. Take with or immediately following a meal., Disp: 45 tablet, Rfl: 3   pantoprazole (PROTONIX) 40 MG tablet, Take 1 tablet (40 mg total) by mouth daily., Disp: 30 tablet, Rfl: 1   sertraline (ZOLOFT) 100 MG tablet, Take 100 mg by mouth daily., Disp: , Rfl:    ticagrelor (BRILINTA) 90 MG TABS tablet, Take 1 tablet (90 mg total) by mouth 2 (two) times daily., Disp: 60 tablet, Rfl: 3   Vitamin D, Ergocalciferol, (DRISDOL)  1.25 MG (50000 UNIT) CAPS capsule, Take 50,000 Units by mouth once a week., Disp: , Rfl:   Past Medical History: Past Medical History:  Diagnosis Date   Anxiety    Depression    GERD (gastroesophageal reflux disease)    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   Exercise Prescription Goal: Initial exercise prescription builds to 30-45 minutes a day of aerobic activity, 2-3 days per week.  Home exercise guidelines will be given to patient during program as part of exercise prescription that the participant will acknowledge.   Education: Aerobic Exercise: - Group verbal and visual presentation on the components of exercise prescription. Introduces F.I.T.T principle from ACSM for exercise prescriptions.  Reviews F.I.T.T. principles of aerobic  exercise including progression. Written material given at graduation. Flowsheet Row Cardiac Rehab from 02/20/2022 in [facility] Cardiac and Pulmonary Rehab  Education need identified 02/07/22  Date 02/20/22  Educator KW  Instruction Review Code 1- [employer] Understanding       Education: Resistance Exercise: - Group verbal and visual presentation on the components of exercise prescription. Introduces F.I.T.T principle from ACSM for exercise prescriptions  Reviews F.I.T.T. principles of resistance exercise including progression. Written material given at graduation. Flowsheet Row Cardiac Rehab  from 02/20/2022 in [facility] Cardiac and Pulmonary Rehab  Education need identified 02/07/22        Education: Exercise & Equipment Safety: - Individual verbal instruction and demonstration of equipment use and safety with use of the equipment. Flowsheet Row Cardiac Rehab from 02/20/2022 in [facility] Cardiac and Pulmonary Rehab  Education need identified 02/07/22  Date 02/07/22  Educator KW  Instruction Review Code 1- Verbalizes Understanding       Education: Exercise Physiology & General Exercise Guidelines: - Group verbal and written instruction with models to review the exercise physiology of the cardiovascular system and associated critical values. Provides general exercise guidelines with specific guidelines to those with heart or lung disease.  Flowsheet Row Cardiac Rehab from 02/20/2022 in [facility] Cardiac and Pulmonary Rehab  Education need identified 02/07/22       Education: Flexibility, Balance, Mind/Body Relaxation: - Group verbal and visual presentation with interactive activity on the components of exercise prescription. Introduces F.I.T.T principle from ACSM for exercise prescriptions. Reviews F.I.T.T. principles of flexibility and balance exercise training including progression. Also discusses the mind body connection.  Reviews various relaxation techniques to help reduce and manage stress (i.e. Deep breathing, progressive muscle relaxation, and visualization). Balance handout provided to take home. Written material given at graduation.   Activity Barriers & Risk Stratification:  Activity Barriers & Cardiac Risk Stratification - 02/07/22 1544       Activity Barriers & Cardiac Risk Stratification   Activity Barriers Joint Problems;Muscular Weakness   Right knee scope completed 01/17/22   Cardiac Risk Stratification Moderate             6 Minute Walk:  6 Minute Walk     Row Name 02/07/22 1545         6 Minute Walk   Phase Initial     Distance 1345 feet     Walk Time  6 minutes     # of Rest Breaks 0     MPH 2.54     METS 3.18     RPE 11     Perceived Dyspnea  1     VO2 Peak 11.16     Symptoms No     Resting HR 72 bpm     Resting BP 102/66     Resting Oxygen Saturation  99 %     Exercise Oxygen Saturation  during 6 min walk 96 %     Max Ex. HR 98 bpm     Max Ex. BP 124/64     2 Minute Post BP 118/68              Oxygen Initial Assessment:   Oxygen Re-Evaluation:   Oxygen Discharge (Final Oxygen Re-Evaluation):   Initial Exercise Prescription:  Initial Exercise Prescription - 02/07/22 1500       Date of Initial Exercise RX and Referring Provider   Date 02/07/22    Referring Provider End, Harrell Gave MD      Oxygen   Maintain Oxygen Saturation  88% or higher      Treadmill   MPH 2.5    Grade 0.5    Minutes 15    METs 3.09      NuStep   Level 3    SPM 80    Minutes 15    METs 3.18      REL-XR   Level 2    Speed 50    Minutes 15    METs 3.18      Prescription Details   Frequency (times per week) 3    Duration Progress to 30 minutes of continuous aerobic without signs/symptoms of physical distress      Intensity   THRR 40-80% of Max Heartrate 106 - 141    Ratings of Perceived Exertion 11-13    Perceived Dyspnea 0-4      Progression   Progression Continue to progress workloads to maintain intensity without signs/symptoms of physical distress.      Resistance Training   Training Prescription Yes    Weight 4 lb    Reps 10-15             Perform Capillary Blood Glucose checks as needed.  Exercise Prescription Changes:   Exercise Prescription Changes     Row Name 02/07/22 1500 02/13/22 1300 02/18/22 1400         Response to Exercise   Blood Pressure (Admit) 102/66 110/64 --     Blood Pressure (Exercise) 124/64 128/62 --     Blood Pressure (Exit) 118/68 100/68 --     Heart Rate (Admit) 72 bpm 81 bpm --     Heart Rate (Exercise) 98 bpm 108 bpm --     Heart Rate (Exit) 71 bpm 79 bpm --      Oxygen Saturation (Admit) 98 % -- --     Oxygen Saturation (Exercise) 96 % -- --     Rating of Perceived Exertion (Exercise) 11 11 --     Perceived Dyspnea (Exercise) 1 -- --     Symptoms none none --     Comments walk test results first full day of exercise --     Duration -- Continue with 30 min of aerobic exercise without signs/symptoms of physical distress. --     Intensity -- THRR unchanged --       Progression   Progression -- Continue to progress workloads to maintain intensity without signs/symptoms of physical distress. --     Average METs -- 3.04 --       Resistance Training   Training Prescription -- Yes --     Weight -- 4 lb --     Reps -- 10-15 --       Interval Training   Interval Training -- No --       Treadmill   MPH -- 2.5 --     Grade -- 0.5 --     Minutes -- 15 --     METs -- 3.09 --       REL-XR   Level -- 1 --     Minutes -- 15 --       Home Exercise Plan   Plans to continue exercise at -- -- Home (comment)  recumbent bike, walking     Frequency -- -- Add 2 additional days to program exercise sessions.     Initial Home Exercises Provided -- -- 02/18/22       Oxygen   Maintain Oxygen Saturation -- 88% or higher 88% or higher  Exercise Comments:   Exercise Comments     Row Name 02/11/22 1341           Exercise Comments First full day of exercise!  Patient was oriented to gym and equipment including functions, settings, policies, and procedures.  Patient's individual exercise prescription and treatment plan were reviewed.  All starting workloads were established based on the results of the 6 minute walk test done at initial orientation visit.  The plan for exercise progression was also introduced and progression will be customized based on patient's performance and goals.                Exercise Goals and Review:   Exercise Goals     Row Name 02/07/22 1549             Exercise Goals   Increase Physical Activity  Yes       Intervention Provide advice, education, support and counseling about physical activity/exercise needs.;Develop an individualized exercise prescription for aerobic and resistive training based on initial evaluation findings, risk stratification, comorbidities and participant's personal goals.       Expected Outcomes Short Term: Attend rehab on a regular basis to increase amount of physical activity.;Long Term: Add in home exercise to make exercise part of routine and to increase amount of physical activity.;Long Term: Exercising regularly at least 3-5 days a week.       Increase Strength and Stamina Yes       Intervention Provide advice, education, support and counseling about physical activity/exercise needs.;Develop an individualized exercise prescription for aerobic and resistive training based on initial evaluation findings, risk stratification, comorbidities and participant's personal goals.       Expected Outcomes Short Term: Increase workloads from initial exercise prescription for resistance, speed, and METs.;Short Term: Perform resistance training exercises routinely during rehab and add in resistance training at home;Long Term: Improve cardiorespiratory fitness, muscular endurance and strength as measured by increased METs and functional capacity (6MWT)       Able to understand and use rate of perceived exertion (RPE) scale Yes       Intervention Provide education and explanation on how to use RPE scale       Expected Outcomes Short Term: Able to use RPE daily in rehab to express subjective intensity level;Long Term:  Able to use RPE to guide intensity level when exercising independently       Able to understand and use Dyspnea scale Yes       Intervention Provide education and explanation on how to use Dyspnea scale       Expected Outcomes Short Term: Able to use Dyspnea scale daily in rehab to express subjective sense of shortness of breath during exertion;Long Term: Able to use  Dyspnea scale to guide intensity level when exercising independently       Knowledge and understanding of Target Heart Rate Range (THRR) Yes       Intervention Provide education and explanation of THRR including how the numbers were predicted and where they are located for reference       Expected Outcomes Short Term: Able to state/look up THRR;Long Term: Able to use THRR to govern intensity when exercising independently;Short Term: Able to use daily as guideline for intensity in rehab       Able to check pulse independently Yes       Intervention Provide education and demonstration on how to check pulse in carotid and radial arteries.;Review the importance of being able to check your  own pulse for safety during independent exercise       Expected Outcomes Long Term: Able to check pulse independently and accurately;Short Term: Able to explain why pulse checking is important during independent exercise       Understanding of Exercise Prescription Yes       Intervention Provide education, explanation, and written materials on patient's individual exercise prescription       Expected Outcomes Short Term: Able to explain program exercise prescription;Long Term: Able to explain home exercise prescription to exercise independently                Exercise Goals Re-Evaluation :  Exercise Goals Re-Evaluation     Row Name 02/11/22 1341 02/13/22 1356 02/18/22 1408         Exercise Goal Re-Evaluation   Exercise Goals Review Increase Physical Activity;Able to understand and use rate of perceived exertion (RPE) scale;Knowledge and understanding of Target Heart Rate Range (THRR);Understanding of Exercise Prescription;Increase Strength and Stamina;Able to check pulse independently;Improve claudication pain tolerance and improve walking ability Increase Physical Activity;Increase Strength and Stamina;Understanding of Exercise Prescription Increase Physical Activity;Increase Strength and Stamina;Understanding  of Exercise Prescription     Comments Reviewed RPE scale, THR and program prescription with pt today.  Pt voiced understanding and was given a copy of goals to take home. Connie Cameron is doing well for her first session of exercise at rehab. She was able to exercise at her initial exercise prescription and had appropriate RPEs are to follow. We will continue to monitor as she progresses in the program. Reviewed home exercise with pt today.  Pt plans to walk and ride her recumbent bike for exercise.  Patient would like to join a gym but states she wouldn't be able to afford it. Encouraged her to reach out to [facility] to see if they can use a scholarship program or something to help financially. Reviewed THR, pulse, RPE, sign and symptoms, pulse oximetery and when to call 911 or MD.  Also discussed weather considerations and indoor options.  Pt voiced understanding.     Expected Outcomes Short: Use RPE daily to regulate intensity.  Long: Follow program prescription in THR. Short: Continue to follow exercise prescription Long: Build up overall strength and stamina Short: Add on 1 day of structured exercise at home, break up minutes between walking and biking Long: Continue to exercise independeently at home at appropriate prescription              Discharge Exercise Prescription (Final Exercise Prescription Changes):  Exercise Prescription Changes - 02/18/22 1400       Home Exercise Plan   Plans to continue exercise at Home (comment)   recumbent bike, walking   Frequency Add 2 additional days to program exercise sessions.    Initial Home Exercises Provided 02/18/22      Oxygen   Maintain Oxygen Saturation 88% or higher             Nutrition:  Target Goals: Understanding of nutrition guidelines, daily intake of sodium '1500mg'$ , cholesterol '200mg'$ , calories 30% from fat and 7% or less from saturated fats, daily to have 5 or more servings of fruits and vegetables.  Education: All About  Nutrition: -Group instruction provided by verbal, written material, interactive activities, discussions, models, and posters to present general guidelines for heart healthy nutrition including fat, fiber, MyPlate, the role of sodium in heart healthy nutrition, utilization of the nutrition label, and utilization of this knowledge for meal planning. Follow up email sent  as well. Written material given at graduation. Flowsheet Row Cardiac Rehab from 02/20/2022 in [facility] Cardiac and Pulmonary Rehab  Education need identified 02/07/22       Biometrics:  Pre Biometrics - 02/07/22 1545       Pre Biometrics   Height 5' 2.2" (1.58 m)    Weight 164 lb 1.6 oz (74.4 kg)    Waist Circumference 41.5 inches    Hip Circumference 43.5 inches    Waist to Hip Ratio 0.95 %    BMI (Calculated) 29.82    Single Leg Stand 5.15 seconds              Nutrition Therapy Plan and Nutrition Goals:  Nutrition Therapy & Goals - 02/07/22 1553       Intervention Plan   Intervention Prescribe, educate and counsel regarding individualized specific dietary modifications aiming towards targeted core components such as weight, hypertension, lipid management, diabetes, heart failure and other comorbidities.    Expected Outcomes Short Term Goal: Understand basic principles of dietary content, such as calories, fat, sodium, cholesterol and nutrients.;Short Term Goal: A plan has been developed with personal nutrition goals set during dietitian appointment.;Long Term Goal: Adherence to prescribed nutrition plan.             Nutrition Assessments:  MEDIFICTS Score Key: ?70 Need to make dietary changes  40-70 Heart Healthy Diet ? 40 Therapeutic Level Cholesterol Diet  Flowsheet Row Cardiac Rehab from 02/07/2022 in [facility] Cardiac and Pulmonary Rehab  Picture Your Plate Total Score on Admission 61      Picture Your Plate Scores: <04 Unhealthy dietary pattern with much room for improvement. 41-50 Dietary pattern  unlikely to meet recommendations for good health and room for improvement. 51-60 More healthful dietary pattern, with some room for improvement.  >60 Healthy dietary pattern, although there may be some specific behaviors that could be improved.    Nutrition Goals Re-Evaluation:  Nutrition Goals Re-Evaluation     [place] Name 02/18/22 1344             Goals   Nutrition Goal --       Comment The RD is out on medical leave and had unable to meet with patient at this time. With small discussion, patient states she is eating more salads by increasing her greens, she is eliminating mostly all red meat and eating more lean proteins like chicken and [place]. She stopped eating fried food. She is eating a low sodium diet as she is checking the label more. Potato chips and pickles were her weakness - discussed finding low sodium chips and/or eliminating the portions.       Expected Outcome Short: Meet with RD and establish specific goals, continue to work on low sodium and red meats Long: Continue to eat heart healthy diet                Nutrition Goals Discharge (Final Nutrition Goals Re-Evaluation):  Nutrition Goals Re-Evaluation - 02/18/22 1344       Goals   Nutrition Goal --    Comment The RD is out on medical leave and had unable to meet with patient at this time. With small discussion, patient states she is eating more salads by increasing her greens, she is eliminating mostly all red meat and eating more lean proteins like chicken and [place]. She stopped eating fried food. She is eating a low sodium diet as she is checking the label more. Potato chips and pickles were her weakness -  discussed finding low sodium chips and/or eliminating the portions.    Expected Outcome Short: Meet with RD and establish specific goals, continue to work on low sodium and red meats Long: Continue to eat heart healthy diet             Psychosocial: Target Goals: Acknowledge presence or absence of  significant depression and/or stress, maximize coping skills, provide positive support system. Participant is able to verbalize types and ability to use techniques and skills needed for reducing stress and depression.   Education: Stress, Anxiety, and Depression - Group verbal and visual presentation to define topics covered.  Reviews how body is impacted by stress, anxiety, and depression.  Also discusses healthy ways to reduce stress and to treat/manage anxiety and depression.  Written material given at graduation.   Education: Sleep Hygiene -Provides group verbal and written instruction about how sleep can affect your health.  Define sleep hygiene, discuss sleep cycles and impact of sleep habits. Review good sleep hygiene tips.    Initial Review & Psychosocial Screening:  Initial Psych Review & Screening - 02/05/22 1015       Initial Review   Current issues with History of Depression      Family Dynamics   Good Support System? Yes   son, roommate     Barriers   Psychosocial barriers to participate in program There are no identifiable barriers or psychosocial needs.;The patient should benefit from training in stress management and relaxation.      Screening Interventions   Interventions Encouraged to exercise;Provide feedback about the scores to participant;To provide support and resources with identified psychosocial needs    Expected Outcomes Long Term Goal: Stressors or current issues are controlled or eliminated.;Short Term goal: Utilizing psychosocial counselor, staff and physician to assist with identification of specific Stressors or current issues interfering with healing process. Setting desired goal for each stressor or current issue identified.;Short Term goal: Identification and review with participant of any Quality of Life or Depression concerns found by scoring the questionnaire.;Long Term goal: The participant improves quality of Life and PHQ9 Scores as seen by post scores  and/or verbalization of changes             Quality of Life Scores:   Quality of Life - 02/07/22 1527       Quality of Life   Select Quality of Life      Quality of Life Scores   Health/Function Pre 21.5 %    Socioeconomic Pre 22.79 %    Psych/Spiritual Pre 25.29 %    Family Pre 27 %    GLOBAL Pre 23.3 %            Scores of 19 and below usually indicate a poorer quality of life in these areas.  A difference of  2-3 points is a clinically meaningful difference.  A difference of 2-3 points in the total score of the Quality of Life Index has been associated with significant improvement in overall quality of life, self-image, physical symptoms, and general health in studies assessing change in quality of life.  PHQ-9: Review Flowsheet       02/07/2022  Depression screen PHQ 2/9  Decreased Interest 0  Down, Depressed, Hopeless 0  PHQ - 2 Score 0  Altered sleeping 0  Tired, decreased energy 1  Change in appetite 0  Feeling bad or failure about yourself  0  Trouble concentrating 0  Moving slowly or fidgety/restless 0  Suicidal thoughts 0  PHQ-9  Score 1  Difficult doing work/chores Not difficult at all   Interpretation of Total Score  Total Score Depression Severity:  1-4 = Minimal depression, 5-9 = Mild depression, 10-14 = Moderate depression, 15-19 = Moderately severe depression, 20-27 = Severe depression   Psychosocial Evaluation and Intervention:  Psychosocial Evaluation - 02/05/22 1031       Psychosocial Evaluation & Interventions   Interventions Encouraged to exercise with the program and follow exercise prescription    Comments Connie Cameron 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 She reports no chest pain, but feeling sluggish so she is trying to rest. She is on disability after oral cancer a few years ago. She has her son and roommate as her support system. She does have a history of anxiety, depression, adn stress. She states she is doing well in that  area, and after her heart attack she has decided she doesn't want to let anything stress her out any more. She is working on staying calm. She wants to come exercise and learn more about heart healthy living    Expected Outcomes Short: attend cardiac rehab for exercise and education. Long: Develop and maintain positive self care habits.    Continue Psychosocial Services  Follow up required by staff             Psychosocial Re-Evaluation:  Psychosocial Re-Evaluation     [place] Name 02/18/22 1353             Psychosocial Re-Evaluation   Current issues with Current Sleep Concerns;History of Depression;Current Stress Concerns       Comments Connie Cameron states she is doing well mentally- she has good support from her son and roommate and living situation at home is fine. She continues to do crafts and paint for stress relief as she states she does she stressed easily. We talked about trying progresive musclar relaxation or meditiations she can find on Youtube. She is willing to try. 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 She states she sleeps well but has a hard time falling asleep. She did admit to being on her screems before bed. Besides eliminating screen time, she will ask her doctor on maybe trying Melatonin or something else to help her fall asleep.       Expected Outcomes Short: Ask doctor about sleep, continue coming to rehab Long: Continue to utilize exercise for stress management and continue to maintain positive attitude       Interventions Encouraged to attend Cardiac Rehabilitation for the exercise       Continue Psychosocial Services  Follow up required by staff                Psychosocial Discharge (Final Psychosocial Re-Evaluation):  Psychosocial Re-Evaluation - 02/18/22 1353       Psychosocial Re-Evaluation   Current issues with Current  Sleep Concerns;History of Depression;Current Stress Concerns    Comments Connie Cameron states she is doing well mentally- she has good support from her son and roommate and living situation at home is fine. She continues to do crafts and paint for stress relief as she states she does she stressed easily. We talked about trying progresive musclar relaxation or meditiations she can find on Youtube. She is willing to try. 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 She states she sleeps well but has a hard time falling asleep. She did admit to being on her screems before bed. Besides eliminating screen time, she will ask her doctor on maybe trying Melatonin or something else to help her fall asleep.    Expected Outcomes Short: Ask doctor about sleep, continue coming to rehab Long: Continue to utilize exercise for stress management and continue to maintain positive attitude    Interventions Encouraged to attend Cardiac Rehabilitation for the exercise    Continue Psychosocial Services  Follow up required by staff             Vocational Rehabilitation: Provide vocational rehab assistance to qualifying candidates.   Vocational Rehab Evaluation & Intervention:  Vocational Rehab - 02/05/22 1018       Initial Vocational Rehab Evaluation & Intervention   Assessment shows need for Vocational Rehabilitation Yes             Education: Education Goals: Education classes will be provided on a variety of topics geared toward better understanding of heart health and risk factor modification. Participant will state understanding/return demonstration of topics presented as noted by education test scores.  Learning Barriers/Preferences:  Learning Barriers/Preferences - 02/05/22 1015       Learning Barriers/Preferences   Learning Barriers None    Learning Preferences  Individual Instruction             General Cardiac Education Topics:  AED/CPR: - Group verbal and written instruction with the use of models to demonstrate the basic use of the AED with the basic ABC's of resuscitation.   Anatomy and Cardiac Procedures: - Group verbal and visual presentation and models provide information about basic cardiac anatomy and function. Reviews the testing methods done to diagnose heart disease and the outcomes of the test results. Describes the treatment choices: Medical Management, Angioplasty, or Coronary Bypass Surgery for treating various heart conditions including Myocardial Infarction, Angina, Valve Disease, and Cardiac Arrhythmias.  Written material given at graduation.   Medication Safety: - Group verbal and visual instruction to review commonly prescribed medications for heart and lung disease. Reviews the medication, class of the drug, and side effects. Includes the steps to properly store meds and maintain the prescription regimen.  Written material given at graduation.   Intimacy: - Group verbal instruction through game format to discuss how heart and lung disease can affect sexual intimacy. Written material given at graduation.. Flowsheet Row Cardiac Rehab from 02/20/2022 in [facility] Cardiac and Pulmonary Rehab  Date 02/20/22  Educator KW  Instruction Review Code 1- Verbalizes Understanding       Know Your Numbers and Heart Failure: - Group verbal and visual instruction to discuss disease risk factors for cardiac and pulmonary disease and treatment options.  Reviews associated critical values for Overweight/Obesity, Hypertension, Cholesterol, and Diabetes.  Discusses basics of heart failure: signs/symptoms and treatments.  Introduces Heart Failure Zone chart for action plan for heart failure.  Written material given at graduation.   Infection Prevention: - Provides verbal and written material to individual with discussion of infection control  including proper hand washing and proper equipment cleaning during exercise session. Flowsheet Row Cardiac Rehab from 02/20/2022 in [facility] Cardiac and Pulmonary Rehab  Education need identified 02/07/22  Date 02/07/22  Educator KW  Instruction Review Code 1- Verbalizes Understanding       Falls Prevention: - Provides verbal and written material  to individual with discussion of falls prevention and safety. Flowsheet Row Cardiac Rehab from 02/20/2022 in [facility] Cardiac and Pulmonary Rehab  Education need identified 02/07/22  Date 02/07/22  Educator KW  Instruction Review Code 1- Verbalizes Understanding       Other: -Provides group and verbal instruction on various topics (see comments)   Knowledge Questionnaire Score:  Knowledge Questionnaire Score - 02/07/22 1527       Knowledge Questionnaire Score   Pre Score 22/26             Core Components/Risk Factors/Patient Goals at Admission:  Personal Goals and Risk Factors at Admission - 02/07/22 1551       Core Components/Risk Factors/Patient Goals on Admission    Weight Management Yes;Weight Loss    Intervention Weight Management: Develop a combined nutrition and exercise program designed to reach desired caloric intake, while maintaining appropriate intake of nutrient and fiber, sodium and fats, and appropriate energy expenditure required for the weight goal.;Weight Management: Provide education and appropriate resources to help participant work on and attain dietary goals.;Weight Management/Obesity: Establish reasonable short term and long term weight goals.    Admit Weight 164 lb (74.4 kg)    Goal Weight: Short Term 160 lb (72.6 kg)    Goal Weight: Long Term 155 lb (70.3 kg)    Expected Outcomes Short Term: Continue to assess and modify interventions until short term weight is achieved;Long Term: Adherence to nutrition and physical activity/exercise program aimed toward attainment of established weight goal;Weight Loss:  Understanding of general recommendations for a balanced deficit meal plan, which promotes 1-2 lb weight loss per week and includes a negative energy balance of [phone] kcal/d;Understanding recommendations for meals to include 15-35% energy as protein, 25-35% energy from fat, 35-60% energy from carbohydrates, less than '200mg'$  of dietary cholesterol, 20-35 gm of total fiber daily;Understanding of distribution of calorie intake throughout the day with the consumption of 4-5 meals/snacks    Lipids Yes    Intervention Provide education and support for participant on nutrition & aerobic/resistive exercise along with prescribed medications to achieve LDL '70mg'$ , HDL >'40mg'$ .    Expected Outcomes Short Term: Participant states understanding of desired cholesterol values and is compliant with medications prescribed. Participant is following exercise prescription and nutrition guidelines.;Long Term: Cholesterol controlled with medications as prescribed, with individualized exercise RX and with personalized nutrition plan. Value goals: LDL < '70mg'$ , HDL > 40 mg.             Education:Diabetes - Individual verbal and written instruction to review signs/symptoms of diabetes, desired ranges of glucose level fasting, after meals and with exercise. Acknowledge that pre and post exercise glucose checks will be done for 3 sessions at entry of program.   Core Components/Risk Factors/Patient Goals Review:   Goals and Risk Factor Review     Row Name 02/18/22 1348             Core Components/Risk Factors/Patient Goals Review   Personal Goals Review Lipids;Weight Management/Obesity       Review Connie Cameron is doing well. She does not have scale to use at home so she does not weigh herself besides what she sees at rehab. Funds are limited- encouraged to ask doctor about a financial assistance with getting DME through insurance or somewhere else. Explained importance of checking weight. Her last lipid check is ordered and  she was told to get her next check in March. She is taking all medications as prescribed with no issues.  Expected Outcomes Short: Obtain a scale for home Long: Continue to manage lifestyle risk factors                Core Components/Risk Factors/Patient Goals at Discharge (Final Review):   Goals and Risk Factor Review - 02/18/22 1348       Core Components/Risk Factors/Patient Goals Review   Personal Goals Review Lipids;Weight Management/Obesity    Review Connie Cameron is doing well. She does not have scale to use at home so she does not weigh herself besides what she sees at rehab. Funds are limited- encouraged to ask doctor about a financial assistance with getting DME through insurance or somewhere else. Explained importance of checking weight. Her last lipid check is ordered and she was told to get her next check in March. She is taking all medications as prescribed with no issues.    Expected Outcomes Short: Obtain a scale for home Long: Continue to manage lifestyle risk factors             ITP Comments:  ITP Comments     Row Name 02/05/22 1029 02/07/22 1522 02/11/22 1340 02/27/22 1039     ITP Comments Initial phone call completed. Diagnosis can be found in [facility] 12/20. EP Orientation scheduled for Thursday 1/4 at 2pm. Completed 6MWT and gym orientation. Initial ITP created and sent for review to Dr. Emily Filbert, Medical Director. First full day of exercise!  Patient was oriented to gym and equipment including functions, settings, policies, and procedures.  Patient's individual exercise prescription and treatment plan were reviewed.  All starting workloads were established based on the results of the 6 minute walk test done at initial orientation visit.  The plan for exercise progression was also introduced and progression will be customized based on patient's performance and goals. 30 Day review completed. Medical Director ITP review done, changes made as directed, and signed approval  by Medical Director.   new to program             Comments:

## 2022-03-04 ENCOUNTER — Encounter: Payer: Medicare Other | Admitting: *Deleted

## 2022-03-04 DIAGNOSIS — I213 ST elevation (STEMI) myocardial infarction of unspecified site: Secondary | ICD-10-CM | POA: Diagnosis not present

## 2022-03-04 DIAGNOSIS — Z955 Presence of coronary angioplasty implant and graft: Secondary | ICD-10-CM

## 2022-03-04 NOTE — Progress Notes (Signed)
Daily Session Note  Patient Details  Name: Connie Cameron MRN: [id number] Date of Birth: 1960/07/28 Referring Provider:   Flowsheet Row Cardiac Rehab from 02/07/2022 in [facility] Cardiac and Pulmonary Rehab  Referring Provider End, Harrell Gave MD       Encounter Date: 03/04/2022  Check In:  Session Check In - 03/04/22 1327       Check-In   Supervising physician immediately available to respond to emergencies See telemetry face sheet for immediately available ER MD    Location [facility]-Cardiac & Pulmonary Rehab    Staff Present Renita Papa, RN BSN;Joseph Tessie Fass, RCP,RRT,BSRT;Megan Tamala Julian, RN, [place]    Virtual Visit No    Medication changes reported     No    Fall or balance concerns reported    No    Warm-up and Cool-down Performed on first and last piece of equipment    Resistance Training Performed Yes    VAD Patient? No      Pain Assessment   Currently in Pain? No/denies                Social History   Tobacco Use  Smoking Status Never  Smokeless Tobacco Never    Goals Met:  Independence with exercise equipment Exercise tolerated well No report of concerns or symptoms today Strength training completed today  Goals Unmet:  Not Applicable  Comments: Pt able to follow exercise prescription today without complaint.  Will continue to monitor for progression.    Dr. Emily Filbert is Medical Director for [place].  Dr. Ottie Glazier is Medical Director for [facility] Pulmonary Rehabilitation.

## 2022-03-06 ENCOUNTER — Encounter: Payer: Medicare Other | Admitting: *Deleted

## 2022-03-06 DIAGNOSIS — I213 ST elevation (STEMI) myocardial infarction of unspecified site: Secondary | ICD-10-CM | POA: Diagnosis not present

## 2022-03-06 DIAGNOSIS — Z955 Presence of coronary angioplasty implant and graft: Secondary | ICD-10-CM

## 2022-03-06 NOTE — Progress Notes (Signed)
Daily Session Note  Patient Details  Name: Connie Cameron MRN: [id number] Date of Birth: 10/02/1960 Referring Provider:   Flowsheet Row Cardiac Rehab from 02/07/2022 in [facility] Cardiac and Pulmonary Rehab  Referring Provider End, Harrell Gave MD       Encounter Date: 03/06/2022  Check In:  Session Check In - 03/06/22 1334       Check-In   Supervising physician immediately available to respond to emergencies See telemetry face sheet for immediately available ER MD    Location [facility]-Cardiac & Pulmonary Rehab    Staff Present Renita Papa, RN Odelia Gage, RN, ADN;Jessica Luan Pulling, MA, RCEP, CCRP, CCET    Virtual Visit No    Medication changes reported     No    Fall or balance concerns reported    No    Warm-up and Cool-down Performed on first and last piece of equipment    Resistance Training Performed Yes    VAD Patient? No    PAD/SET Patient? No      Pain Assessment   Currently in Pain? No/denies                Social History   Tobacco Use  Smoking Status Never  Smokeless Tobacco Never    Goals Met:  Independence with exercise equipment Exercise tolerated well No report of concerns or symptoms today Strength training completed today  Goals Unmet:  Not Applicable  Comments: Pt able to follow exercise prescription today without complaint.  Will continue to monitor for progression.    Dr. Emily Filbert is Medical Director for [place].  Dr. Ottie Glazier is Medical Director for [facility] Pulmonary Rehabilitation.

## 2022-03-07 ENCOUNTER — Encounter: Payer: Medicare Other | Attending: Internal Medicine | Admitting: *Deleted

## 2022-03-07 DIAGNOSIS — I213 ST elevation (STEMI) myocardial infarction of unspecified site: Secondary | ICD-10-CM | POA: Insufficient documentation

## 2022-03-07 DIAGNOSIS — Z955 Presence of coronary angioplasty implant and graft: Secondary | ICD-10-CM | POA: Diagnosis present

## 2022-03-07 NOTE — Progress Notes (Signed)
Daily Session Note  Patient Details  Name: Connie Cameron MRN: [id number] Date of Birth: [birth year] Referring Provider:   Flowsheet Row Cardiac Rehab from 02/07/2022 in [facility] Cardiac and Pulmonary Rehab  Referring Provider End, Harrell Gave MD       Encounter Date: 03/07/2022  Check In:  Session Check In - 03/07/22 1334       Check-In   Supervising physician immediately available to respond to emergencies See telemetry face sheet for immediately available ER MD    Location [facility]-Cardiac & Pulmonary Rehab    Staff Present Darlyne Russian, RN, ADN;Jessica Luan Pulling, MA, RCEP, CCRP, CCET;Joseph [place], [place]    Virtual Visit No    Medication changes reported     No    Fall or balance concerns reported    No    Warm-up and Cool-down Performed on first and last piece of equipment    Resistance Training Performed Yes    VAD Patient? No    PAD/SET Patient? No      Pain Assessment   Currently in Pain? No/denies                Social History   Tobacco Use  Smoking Status Never  Smokeless Tobacco Never    Goals Met:  Independence with exercise equipment Exercise tolerated well No report of concerns or symptoms today Strength training completed today  Goals Unmet:  Not Applicable  Comments: Pt able to follow exercise prescription today without complaint.  Will continue to monitor for progression.    Dr. Emily Filbert is Medical Director for [place].  Dr. Ottie Glazier is Medical Director for [facility] Pulmonary Rehabilitation.

## 2022-03-22 ENCOUNTER — Telehealth: Payer: Self-pay | Admitting: Physician Assistant

## 2022-03-22 MED ORDER — ATORVASTATIN CALCIUM 80 MG PO TABS: 80.0000 mg | ORAL_TABLET | Freq: Every day | ORAL | 0 refills | Status: DC

## 2022-03-22 MED ORDER — EZETIMIBE 10 MG PO TABS: 10.0000 mg | ORAL_TABLET | Freq: Every day | ORAL | 0 refills | Status: DC

## 2022-03-22 NOTE — Telephone Encounter (Signed)
Atorvastatin and ezetimibe refill sent to pharmacy as requested. ASA is OTC. Call attempted to reach patient to let her know she has to get sertraline and pantoprazole filled through PCP but her voice mail box is full.

## 2022-03-22 NOTE — Telephone Encounter (Signed)
*  STAT* If patient is at the pharmacy, call can be transferred to refill team.   1. Which medications need to be refilled? (please list name of each medication and dose if known)  Asprin 75m Sertraline 1033mPantoprazole 4057mtorvastatin 75m85metimibe 10ml58m Which pharmacy/location (including street and city if local pharmacy) [facility] [address]

## 2022-03-25 ENCOUNTER — Inpatient Hospital Stay: Payer: Medicare Other | Attending: Internal Medicine

## 2022-03-25 ENCOUNTER — Telehealth: Payer: Self-pay | Admitting: Internal Medicine

## 2022-03-25 DIAGNOSIS — T451X5A Adverse effect of antineoplastic and immunosuppressive drugs, initial encounter: Secondary | ICD-10-CM

## 2022-03-25 DIAGNOSIS — D519 Vitamin B12 deficiency anemia, unspecified: Secondary | ICD-10-CM | POA: Diagnosis present

## 2022-03-25 MED ORDER — CYANOCOBALAMIN 1000 MCG/ML IJ SOLN
1000.0000 ug | Freq: Once | INTRAMUSCULAR | Status: AC
  Administered 2022-03-25: 1000 ug via INTRAMUSCULAR
  Filled 2022-03-25: qty 1

## 2022-03-25 NOTE — Telephone Encounter (Signed)
Pt c/o medication issue:  1. Name of Medication: aspirin 81 MG chewable tablet sertraline (ZOLOFT) 100 MG tablet pantoprazole (PROTONIX) 40 MG tablet  2. How are you currently taking this medication (dosage and times per day)?   3. Are you having a reaction (difficulty breathing--STAT)? No  4. What is your medication issue? Patient is asking if this medication can be filled by her cardiologist. She states she only has one day left and would like the [facility] called [facility]  [id number] [place] [address]

## 2022-03-25 NOTE — Telephone Encounter (Signed)
Attempted to contact pt. Unable to leave message as mailbox is full.  °

## 2022-03-26 ENCOUNTER — Other Ambulatory Visit: Payer: Self-pay | Admitting: Physician Assistant

## 2022-03-26 NOTE — Telephone Encounter (Signed)
Attempted to contact the patient. No answer- unable to leave a message as her voice mail is full.

## 2022-03-27 ENCOUNTER — Encounter: Payer: Self-pay | Admitting: *Deleted

## 2022-03-27 DIAGNOSIS — Z955 Presence of coronary angioplasty implant and graft: Secondary | ICD-10-CM

## 2022-03-27 DIAGNOSIS — I213 ST elevation (STEMI) myocardial infarction of unspecified site: Secondary | ICD-10-CM

## 2022-03-27 NOTE — Progress Notes (Signed)
Cardiac Individual Treatment Plan  Patient Details  Name: KADEEJA RISTINE MRN: [id number] Date of Birth: 1960-06-29 Referring Provider:   Flowsheet Row Cardiac Rehab from 02/07/2022 in [facility] Cardiac and Pulmonary Rehab  Referring Provider End, Harrell Gave MD       Initial Encounter Date:  Flowsheet Row Cardiac Rehab from 02/07/2022 in [facility] Cardiac and Pulmonary Rehab  Date 02/07/22       Visit Diagnosis: ST elevation myocardial infarction (STEMI), unspecified artery [facility])  Status post coronary artery stent placement  Patient's Home Medications on Admission:  Current Outpatient Medications:    aspirin 81 MG chewable tablet, Chew 1 tablet (81 mg total) by mouth daily., Disp: 30 tablet, Rfl: 1   atorvastatin (LIPITOR) 80 MG tablet, Take 1 tablet (80 mg total) by mouth daily., Disp: 90 tablet, Rfl: 0   baclofen (LIORESAL) 10 MG tablet, Take 10 mg by mouth at bedtime as needed., Disp: , Rfl:    ezetimibe (ZETIA) 10 MG tablet, Take 1 tablet (10 mg total) by mouth daily., Disp: 90 tablet, Rfl: 0   gabapentin (NEURONTIN) 300 MG capsule, Take 300 mg by mouth 2 (two) times daily., Disp: , Rfl:    hydrOXYzine (ATARAX/VISTARIL) 25 MG tablet, Take 25 mg by mouth daily as needed for nausea, vomiting or anxiety., Disp: , Rfl:    meclizine (ANTIVERT) 12.5 MG tablet, Take 1 tablet (12.5 mg total) by mouth 3 (three) times daily as needed for dizziness or nausea., Disp: 30 tablet, Rfl: 0   metoprolol succinate (TOPROL-XL) 25 MG 24 hr tablet, Take 0.5 tablets (12.5 mg total) by mouth daily. Take with or immediately following a meal., Disp: 45 tablet, Rfl: 3   pantoprazole (PROTONIX) 40 MG tablet, Take 1 tablet (40 mg total) by mouth daily., Disp: 30 tablet, Rfl: 1   sertraline (ZOLOFT) 100 MG tablet, Take 100 mg by mouth daily., Disp: , Rfl:    ticagrelor (BRILINTA) 90 MG TABS tablet, Take 1 tablet (90 mg total) by mouth 2 (two) times daily., Disp: 60 tablet, Rfl: 3   Vitamin D, Ergocalciferol, (DRISDOL)  1.25 MG (50000 UNIT) CAPS capsule, Take 50,000 Units by mouth once a week., Disp: , Rfl:   Past Medical History: Past Medical History:  Diagnosis Date   Anxiety    Depression    GERD (gastroesophageal reflux disease)    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   Exercise Prescription Goal: Initial exercise prescription builds to 30-45 minutes a day of aerobic activity, 2-3 days per week.  Home exercise guidelines will be given to patient during program as part of exercise prescription that the participant will acknowledge.   Education: Aerobic Exercise: - Group verbal and visual presentation on the components of exercise prescription. Introduces F.I.T.T principle from ACSM for exercise prescriptions.  Reviews F.I.T.T. principles of aerobic  exercise including progression. Written material given at graduation. Flowsheet Row Cardiac Rehab from 02/20/2022 in [facility] Cardiac and Pulmonary Rehab  Education need identified 02/07/22  Date 02/20/22  Educator KW  Instruction Review Code 1- [employer] Understanding       Education: Resistance Exercise: - Group verbal and visual presentation on the components of exercise prescription. Introduces F.I.T.T principle from ACSM for exercise prescriptions  Reviews F.I.T.T. principles of resistance exercise including progression. Written material given at graduation. Flowsheet Row Cardiac Rehab  from 02/20/2022 in [facility] Cardiac and Pulmonary Rehab  Education need identified 02/07/22        Education: Exercise & Equipment Safety: - Individual verbal instruction and demonstration of equipment use and safety with use of the equipment. Flowsheet Row Cardiac Rehab from 02/20/2022 in [facility] Cardiac and Pulmonary Rehab  Education need identified 02/07/22  Date 02/07/22  Educator KW  Instruction Review Code 1- Verbalizes Understanding       Education: Exercise Physiology & General Exercise Guidelines: - Group verbal and written instruction with models to review the exercise physiology of the cardiovascular system and associated critical values. Provides general exercise guidelines with specific guidelines to those with heart or lung disease.  Flowsheet Row Cardiac Rehab from 02/20/2022 in [facility] Cardiac and Pulmonary Rehab  Education need identified 02/07/22       Education: Flexibility, Balance, Mind/Body Relaxation: - Group verbal and visual presentation with interactive activity on the components of exercise prescription. Introduces F.I.T.T principle from ACSM for exercise prescriptions. Reviews F.I.T.T. principles of flexibility and balance exercise training including progression. Also discusses the mind body connection.  Reviews various relaxation techniques to help reduce and manage stress (i.e. Deep breathing, progressive muscle relaxation, and visualization). Balance handout provided to take home. Written material given at graduation.   Activity Barriers & Risk Stratification:  Activity Barriers & Cardiac Risk Stratification - 02/07/22 1544       Activity Barriers & Cardiac Risk Stratification   Activity Barriers Joint Problems;Muscular Weakness   Right knee scope completed 01/17/22   Cardiac Risk Stratification Moderate             6 Minute Walk:  6 Minute Walk     Row Name 02/07/22 1545         6 Minute Walk   Phase Initial     Distance 1345 feet     Walk Time  6 minutes     # of Rest Breaks 0     MPH 2.54     METS 3.18     RPE 11     Perceived Dyspnea  1     VO2 Peak 11.16     Symptoms No     Resting HR 72 bpm     Resting BP 102/66     Resting Oxygen Saturation  99 %     Exercise Oxygen Saturation  during 6 min walk 96 %     Max Ex. HR 98 bpm     Max Ex. BP 124/64     2 Minute Post BP 118/68              Oxygen Initial Assessment:   Oxygen Re-Evaluation:   Oxygen Discharge (Final Oxygen Re-Evaluation):   Initial Exercise Prescription:  Initial Exercise Prescription - 02/07/22 1500       Date of Initial Exercise RX and Referring Provider   Date 02/07/22    Referring Provider End, Harrell Gave MD      Oxygen   Maintain Oxygen Saturation  88% or higher      Treadmill   MPH 2.5    Grade 0.5    Minutes 15    METs 3.09      NuStep   Level 3    SPM 80    Minutes 15    METs 3.18      REL-XR   Level 2    Speed 50    Minutes 15    METs 3.18      Prescription Details   Frequency (times per week) 3    Duration Progress to 30 minutes of continuous aerobic without signs/symptoms of physical distress      Intensity   THRR 40-80% of Max Heartrate 106 - 141    Ratings of Perceived Exertion 11-13    Perceived Dyspnea 0-4      Progression   Progression Continue to progress workloads to maintain intensity without signs/symptoms of physical distress.      Resistance Training   Training Prescription Yes    Weight 4 lb    Reps 10-15             Perform Capillary Blood Glucose checks as needed.  Exercise Prescription Changes:   Exercise Prescription Changes     Row Name 02/07/22 1500 02/13/22 1300 02/18/22 1400 02/28/22 1400 03/12/22 1500     Response to Exercise   Blood Pressure (Admit) 102/66 110/64 -- 98/60 126/64   Blood Pressure (Exercise) 124/64 128/62 -- 124/68 126/72   Blood Pressure (Exit) 118/68 100/68 -- 106/68 104/64   Heart Rate (Admit) 72 bpm 81 bpm -- 74 bpm 64 bpm   Heart Rate (Exercise)  98 bpm 108 bpm -- 120 bpm 108 bpm   Heart Rate (Exit) 71 bpm 79 bpm -- 82 bpm 75 bpm   Oxygen Saturation (Admit) 98 % -- -- -- --   Oxygen Saturation (Exercise) 96 % -- -- -- --   Rating of Perceived Exertion (Exercise) 11 11 -- 13 14   Perceived Dyspnea (Exercise) 1 -- -- -- --   Symptoms none none -- none none   Comments walk test results first full day of exercise -- -- --   Duration -- Continue with 30 min of aerobic exercise without signs/symptoms of physical distress. -- Continue with 30 min of aerobic exercise without signs/symptoms of physical distress. Continue with 30 min of aerobic exercise without signs/symptoms of physical distress.   Intensity -- THRR unchanged -- THRR unchanged THRR unchanged     Progression   Progression -- Continue to progress workloads to maintain intensity without signs/symptoms of physical distress. -- Continue to progress workloads to maintain intensity without signs/symptoms of physical distress. Continue to progress workloads to maintain intensity without signs/symptoms of physical distress.   Average METs -- 3.04 -- 3.13 3.2     Resistance Training   Training Prescription -- Yes -- Yes Yes   Weight -- 4 lb -- 4 lb 4 lb   Reps -- 10-15 -- 10-15 10-15     Interval Training   Interval Training -- No -- No No     Treadmill   MPH -- 2.5 -- 2.5 2.5   Grade -- 0.5 -- 1 0.5   Minutes -- 15 -- 15 15   METs -- 3.09 -- 3.26 3.09     NuStep   Level -- -- -- 4 4   Minutes -- -- -- 15 15   METs -- -- -- 3.5 3.4     REL-XR  Level -- 1 -- 3 3   Minutes -- 15 -- 15 15   METs -- -- -- 3.2 --     Home Exercise Plan   Plans to continue exercise at -- -- Home (comment)  recumbent bike, walking Home (comment)  recumbent bike, walking Home (comment)  recumbent bike, walking   Frequency -- -- Add 2 additional days to program exercise sessions. Add 2 additional days to program exercise sessions. Add 2 additional days to program exercise sessions.   Initial  Home Exercises Provided -- -- 02/18/22 02/18/22 02/18/22     Oxygen   Maintain Oxygen Saturation -- 88% or higher 88% or higher 88% or higher 88% or higher            Exercise Comments:   Exercise Comments     Row Name 02/11/22 1341           Exercise Comments First full day of exercise!  Patient was oriented to gym and equipment including functions, settings, policies, and procedures.  Patient's individual exercise prescription and treatment plan were reviewed.  All starting workloads were established based on the results of the 6 minute walk test done at initial orientation visit.  The plan for exercise progression was also introduced and progression will be customized based on patient's performance and goals.                Exercise Goals and Review:   Exercise Goals     Row Name 02/07/22 1549             Exercise Goals   Increase Physical Activity Yes       Intervention Provide advice, education, support and counseling about physical activity/exercise needs.;Develop an individualized exercise prescription for aerobic and resistive training based on initial evaluation findings, risk stratification, comorbidities and participant's personal goals.       Expected Outcomes Short Term: Attend rehab on a regular basis to increase amount of physical activity.;Long Term: Add in home exercise to make exercise part of routine and to increase amount of physical activity.;Long Term: Exercising regularly at least 3-5 days a week.       Increase Strength and Stamina Yes       Intervention Provide advice, education, support and counseling about physical activity/exercise needs.;Develop an individualized exercise prescription for aerobic and resistive training based on initial evaluation findings, risk stratification, comorbidities and participant's personal goals.       Expected Outcomes Short Term: Increase workloads from initial exercise prescription for resistance, speed, and  METs.;Short Term: Perform resistance training exercises routinely during rehab and add in resistance training at home;Long Term: Improve cardiorespiratory fitness, muscular endurance and strength as measured by increased METs and functional capacity (6MWT)       Able to understand and use rate of perceived exertion (RPE) scale Yes       Intervention Provide education and explanation on how to use RPE scale       Expected Outcomes Short Term: Able to use RPE daily in rehab to express subjective intensity level;Long Term:  Able to use RPE to guide intensity level when exercising independently       Able to understand and use Dyspnea scale Yes       Intervention Provide education and explanation on how to use Dyspnea scale       Expected Outcomes Short Term: Able to use Dyspnea scale daily in rehab to express subjective sense of shortness of breath during exertion;Long Term: Able to  use Dyspnea scale to guide intensity level when exercising independently       Knowledge and understanding of Target Heart Rate Range (THRR) Yes       Intervention Provide education and explanation of THRR including how the numbers were predicted and where they are located for reference       Expected Outcomes Short Term: Able to state/look up THRR;Long Term: Able to use THRR to govern intensity when exercising independently;Short Term: Able to use daily as guideline for intensity in rehab       Able to check pulse independently Yes       Intervention Provide education and demonstration on how to check pulse in carotid and radial arteries.;Review the importance of being able to check your own pulse for safety during independent exercise       Expected Outcomes Long Term: Able to check pulse independently and accurately;Short Term: Able to explain why pulse checking is important during independent exercise       Understanding of Exercise Prescription Yes       Intervention Provide education, explanation, and written materials  on patient's individual exercise prescription       Expected Outcomes Short Term: Able to explain program exercise prescription;Long Term: Able to explain home exercise prescription to exercise independently                Exercise Goals Re-Evaluation :  Exercise Goals Re-Evaluation     Row Name 02/11/22 1341 02/13/22 1356 02/18/22 1408 02/28/22 1450 03/06/22 1340     Exercise Goal Re-Evaluation   Exercise Goals Review Increase Physical Activity;Able to understand and use rate of perceived exertion (RPE) scale;Knowledge and understanding of Target Heart Rate Range (THRR);Understanding of Exercise Prescription;Increase Strength and Stamina;Able to check pulse independently;Improve claudication pain tolerance and improve walking ability Increase Physical Activity;Increase Strength and Stamina;Understanding of Exercise Prescription Increase Physical Activity;Increase Strength and Stamina;Understanding of Exercise Prescription Increase Physical Activity;Increase Strength and Stamina;Understanding of Exercise Prescription Increase Physical Activity;Increase Strength and Stamina;Understanding of Exercise Prescription   Comments Reviewed RPE scale, THR and program prescription with pt today.  Pt voiced understanding and was given a copy of goals to take home. Cleotha is doing well for her first session of exercise at rehab. She was able to exercise at her initial exercise prescription and had appropriate RPEs are to follow. We will continue to monitor as she progresses in the program. Reviewed home exercise with pt today.  Pt plans to walk and ride her recumbent bike for exercise.  Patient would like to join a gym but states she wouldn't be able to afford it. Encouraged her to reach out to [facility] to see if they can use a scholarship program or something to help financially. Reviewed THR, pulse, RPE, sign and symptoms, pulse oximetery and when to call 911 or MD.  Also discussed weather considerations and  indoor options.  Pt voiced understanding. Janiesha is doing well in the program. She recently improved her overall average MET level to 3.13 METs. She also improved to level 4 on the T4 and level 3 on XR. She increased her incline on the treadmill to 1% while maintaining a speed of 2.5 mph as well. We will continue to monitor his progress in the program. Valanda is participating in home exercise well. She is exercising about 15 minutes on her recumbent bike and then walking about 30 minutes outdoors. She is sure she is not outside when it is too cold. She is exercising an extra  2 days outside of rehab and states she is tolerating it well. She has not been checking her HR, however, as she does not have a pulse ox and may not be able to afford one due to financial issues. We spoke about how to check HR manually and encouraged her to ask her doctor to see if she can get one discounted or somehow through insurance.   Expected Outcomes Short: Use RPE daily to regulate intensity.  Long: Follow program prescription in THR. Short: Continue to follow exercise prescription Long: Build up overall strength and stamina Short: Add on 1 day of structured exercise at home, break up minutes between walking and biking Long: Continue to exercise independeently at home at appropriate prescription Short: Continue to increase workload on the treadmill. Long: Continue to increase strength and stamina. Short: Start checking HR manually Long: Continue to exercise independently    [place] Name 03/12/22 1537             Exercise Goal Re-Evaluation   Exercise Goals Review Increase Physical Activity;Increase Strength and Stamina;Understanding of Exercise Prescription       Comments Chanay continues to do well in rehab. She has been staying consistent with her workloads on the XR, T4, and treadmill. We will encourage patient to start increasing her workloads again. Fortunately, she has been hitting her THR most sessions. Will continue to  monitor.       Expected Outcomes Short: Increase speed on treadmill, level on T4 Long: Continue to increase overall MET level and stamina                Discharge Exercise Prescription (Final Exercise Prescription Changes):  Exercise Prescription Changes - 03/12/22 1500       Response to Exercise   Blood Pressure (Admit) 126/64    Blood Pressure (Exercise) 126/72    Blood Pressure (Exit) 104/64    Heart Rate (Admit) 64 bpm    Heart Rate (Exercise) 108 bpm    Heart Rate (Exit) 75 bpm    Rating of Perceived Exertion (Exercise) 14    Symptoms none    Duration Continue with 30 min of aerobic exercise without signs/symptoms of physical distress.    Intensity THRR unchanged      Progression   Progression Continue to progress workloads to maintain intensity without signs/symptoms of physical distress.    Average METs 3.2      Resistance Training   Training Prescription Yes    Weight 4 lb    Reps 10-15      Interval Training   Interval Training No      Treadmill   MPH 2.5    Grade 0.5    Minutes 15    METs 3.09      NuStep   Level 4    Minutes 15    METs 3.4      REL-XR   Level 3    Minutes 15      Home Exercise Plan   Plans to continue exercise at Home (comment)   recumbent bike, walking   Frequency Add 2 additional days to program exercise sessions.    Initial Home Exercises Provided 02/18/22      Oxygen   Maintain Oxygen Saturation 88% or higher             Nutrition:  Target Goals: Understanding of nutrition guidelines, daily intake of sodium <1532m, cholesterol <2031m calories 30% from fat and 7% or less from saturated fats, daily to have 5  or more servings of fruits and vegetables.  Education: All About Nutrition: -Group instruction provided by verbal, written material, interactive activities, discussions, models, and posters to present general guidelines for heart healthy nutrition including fat, fiber, MyPlate, the role of sodium in heart  healthy nutrition, utilization of the nutrition label, and utilization of this knowledge for meal planning. Follow up email sent as well. Written material given at graduation. Flowsheet Row Cardiac Rehab from 02/20/2022 in [facility] Cardiac and Pulmonary Rehab  Education need identified 02/07/22       Biometrics:  Pre Biometrics - 02/07/22 1545       Pre Biometrics   Height 5' 2.2" (1.58 m)    Weight 164 lb 1.6 oz (74.4 kg)    Waist Circumference 41.5 inches    Hip Circumference 43.5 inches    Waist to Hip Ratio 0.95 %    BMI (Calculated) 29.82    Single Leg Stand 5.15 seconds              Nutrition Therapy Plan and Nutrition Goals:  Nutrition Therapy & Goals - 02/07/22 1553       Intervention Plan   Intervention Prescribe, educate and counsel regarding individualized specific dietary modifications aiming towards targeted core components such as weight, hypertension, lipid management, diabetes, heart failure and other comorbidities.    Expected Outcomes Short Term Goal: Understand basic principles of dietary content, such as calories, fat, sodium, cholesterol and nutrients.;Short Term Goal: A plan has been developed with personal nutrition goals set during dietitian appointment.;Long Term Goal: Adherence to prescribed nutrition plan.             Nutrition Assessments:  MEDIFICTS Score Key: ?70 Need to make dietary changes  40-70 Heart Healthy Diet ? 40 Therapeutic Level Cholesterol Diet  Flowsheet Row Cardiac Rehab from 02/07/2022 in [facility] Cardiac and Pulmonary Rehab  Picture Your Plate Total Score on Admission 61      Picture Your Plate Scores: [id number] Unhealthy dietary pattern with much room for improvement. 41-50 Dietary pattern unlikely to meet recommendations for good health and room for improvement. 51-60 More healthful dietary pattern, with some room for improvement.  >60 Healthy dietary pattern, although there may be some specific behaviors that could be improved.     Nutrition Goals Re-Evaluation:  Nutrition Goals Re-Evaluation     [place] Name 02/18/22 1344 03/06/22 1342           Goals   Nutrition Goal -- Melissa, the RD continues to be out of office and therefore has not met with the patient to establish individualized goals.      Comment The RD is out on medical leave and had unable to meet with patient at this time. With small discussion, patient states she is eating more salads by increasing her greens, she is eliminating mostly all red meat and eating more lean proteins like chicken and [place]. She stopped eating fried food. She is eating a low sodium diet as she is checking the label more. Potato chips and pickles were her weakness - discussed finding low sodium chips and/or eliminating the portions. Overall, Raini states she is eating more salads to help increase her vegetable intake. She is using light dressing and watches her portion sizes. For protein, she eats  chicken, salmon, tuna and no red meat. She has replaced ground beef with ground [place]. She also does not eat any access sugar. Lastly, she has focused more on drinking more water and states she drinks around 60+ ounces /  day.      Expected Outcome Short: Meet with RD and establish specific goals, continue to work on low sodium and red meats Long: Continue to eat heart healthy diet Short: Meet with RD to establish goals Long: Continue to eat heart healthy               Nutrition Goals Discharge (Final Nutrition Goals Re-Evaluation):  Nutrition Goals Re-Evaluation - 03/06/22 1342       Goals   Nutrition Goal Melissa, the RD continues to be out of office and therefore has not met with the patient to establish individualized goals.    Comment Overall, Caye states she is eating more salads to help increase her vegetable intake. She is using light dressing and watches her portion sizes. For protein, she eats  chicken, salmon, tuna and no red meat. She has replaced ground beef with  ground [place]. She also does not eat any access sugar. Lastly, she has focused more on drinking more water and states she drinks around 60+ ounces / day.    Expected Outcome Short: Meet with RD to establish goals Long: Continue to eat heart healthy             Psychosocial: Target Goals: Acknowledge presence or absence of significant depression and/or stress, maximize coping skills, provide positive support system. Participant is able to verbalize types and ability to use techniques and skills needed for reducing stress and depression.   Education: Stress, Anxiety, and Depression - Group verbal and visual presentation to define topics covered.  Reviews how body is impacted by stress, anxiety, and depression.  Also discusses healthy ways to reduce stress and to treat/manage anxiety and depression.  Written material given at graduation.   Education: Sleep Hygiene -Provides group verbal and written instruction about how sleep can affect your health.  Define sleep hygiene, discuss sleep cycles and impact of sleep habits. Review good sleep hygiene tips.    Initial Review & Psychosocial Screening:  Initial Psych Review & Screening - 02/05/22 1015       Initial Review   Current issues with History of Depression      Family Dynamics   Good Support System? Yes   son, roommate     Barriers   Psychosocial barriers to participate in program There are no identifiable barriers or psychosocial needs.;The patient should benefit from training in stress management and relaxation.      Screening Interventions   Interventions Encouraged to exercise;Provide feedback about the scores to participant;To provide support and resources with identified psychosocial needs    Expected Outcomes Long Term Goal: Stressors or current issues are controlled or eliminated.;Short Term goal: Utilizing psychosocial counselor, staff and physician to assist with identification of specific Stressors or current issues  interfering with healing process. Setting desired goal for each stressor or current issue identified.;Short Term goal: Identification and review with participant of any Quality of Life or Depression concerns found by scoring the questionnaire.;Long Term goal: The participant improves quality of Life and PHQ9 Scores as seen by post scores and/or verbalization of changes             Quality of Life Scores:   Quality of Life - 02/07/22 1527       Quality of Life   Select Quality of Life      Quality of Life Scores   Health/Function Pre 21.5 %    Socioeconomic Pre 22.79 %    Psych/Spiritual Pre 25.29 %    Family  Pre 27 %    GLOBAL Pre 23.3 %            Scores of 19 and below usually indicate a poorer quality of life in these areas.  A difference of  2-3 points is a clinically meaningful difference.  A difference of 2-3 points in the total score of the Quality of Life Index has been associated with significant improvement in overall quality of life, self-image, physical symptoms, and general health in studies assessing change in quality of life.  PHQ-9: Review Flowsheet       02/07/2022  Depression screen PHQ 2/9  Decreased Interest 0  Down, Depressed, Hopeless 0  PHQ - 2 Score 0  Altered sleeping 0  Tired, decreased energy 1  Change in appetite 0  Feeling bad or failure about yourself  0  Trouble concentrating 0  Moving slowly or fidgety/restless 0  Suicidal thoughts 0  PHQ-9 Score 1  Difficult doing work/chores Not difficult at all   Interpretation of Total Score  Total Score Depression Severity:  1-4 = Minimal depression, 5-9 = Mild depression, 10-14 = Moderate depression, 15-19 = Moderately severe depression, 20-27 = Severe depression   Psychosocial Evaluation and Intervention:  Psychosocial Evaluation - 02/05/22 1031       Psychosocial Evaluation & Interventions   Interventions Encouraged to exercise with the program and follow exercise prescription    Comments  Petrita 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 She reports no chest pain, but feeling sluggish so she is trying to rest. She is on disability after oral cancer a few years ago. She has her son and roommate as her support system. She does have a history of anxiety, depression, adn stress. She states she is doing well in that area, and after her heart attack she has decided she doesn't want to let anything stress her out any more. She is working on staying calm. She wants to come exercise and learn more about heart healthy living    Expected Outcomes Short: attend cardiac rehab for exercise and education. Long: Develop and maintain positive self care habits.    Continue Psychosocial Services  Follow up required by staff             Psychosocial Re-Evaluation:  Psychosocial Re-Evaluation     [place] Name 02/18/22 1353 03/06/22 1350           Psychosocial Re-Evaluation   Current issues with Current Sleep Concerns;History of Depression;Current Stress Concerns Current Sleep Concerns;History of Depression;Current Stress Concerns      Comments Marbeth states she is doing well mentally- she has good support from her son and roommate and living situation at home is fine. She continues to do crafts and paint for stress relief as she states she does she stressed easily. We talked about trying progresive musclar relaxation or meditiations she can find on Youtube. She is willing to try. 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 She states she sleeps well but has a hard time falling asleep. She did admit to being on her screems before bed. Besides eliminating screen time, she will ask her doctor on maybe trying Melatonin or something else to help her fall asleep. Lyanna continues to take Zoloft and feels it still helps her as she said she doesnt get so upset so easily  and  it keeps her more mellow.  She continues to do arts and crafts for stress management. She did try to  eliminate screen time before bed but says it hasn't really helped her sleep quality. She is not interested in taking anything else and says its manageable for now. There are no other concerns or changes for her at this time.      Expected Outcomes Short: Ask doctor about sleep, continue coming to rehab Long: Continue to utilize exercise for stress management and continue to maintain positive attitude Short: Continue coming to rehab and notify staff of any changes Long: Continue to maintain positive attitude      Interventions Encouraged to attend Cardiac Rehabilitation for the exercise Encouraged to attend Cardiac Rehabilitation for the exercise      Continue Psychosocial Services  Follow up required by staff Follow up required by staff               Psychosocial Discharge (Final Psychosocial Re-Evaluation):  Psychosocial Re-Evaluation - 03/06/22 1350       Psychosocial Re-Evaluation   Current issues with Current Sleep Concerns;History of Depression;Current Stress Concerns    Comments Kiasia continues to take Zoloft and feels it still helps her as she said she doesnt get so upset so easily and it keeps her more mellow.  She continues to do arts and crafts for stress management. She did try to  eliminate screen time before bed but says it hasn't really helped her sleep quality. She is not interested in taking anything else and says its manageable for now. There are no other concerns or changes for her at this time.    Expected Outcomes Short: Continue coming to rehab and notify staff of any changes Long: Continue to maintain positive attitude    Interventions Encouraged to attend Cardiac Rehabilitation for the exercise    Continue Psychosocial Services  Follow up required by staff             Vocational Rehabilitation: Provide vocational rehab assistance to qualifying candidates.    Vocational Rehab Evaluation & Intervention:  Vocational Rehab - 02/05/22 1018       Initial Vocational Rehab Evaluation & Intervention   Assessment shows need for Vocational Rehabilitation Yes             Education: Education Goals: Education classes will be provided on a variety of topics geared toward better understanding of heart health and risk factor modification. Participant will state understanding/return demonstration of topics presented as noted by education test scores.  Learning Barriers/Preferences:  Learning Barriers/Preferences - 02/05/22 1015       Learning Barriers/Preferences   Learning Barriers None    Learning Preferences Individual Instruction             General Cardiac Education Topics:  AED/CPR: - Group verbal and written instruction with the use of models to demonstrate the basic use of the AED with the basic ABC's of resuscitation.   Anatomy and Cardiac Procedures: - Group verbal and visual presentation and models provide information about basic cardiac anatomy and function. Reviews the testing methods done to diagnose heart disease and the outcomes of the test results. Describes the treatment choices: Medical Management, Angioplasty, or Coronary Bypass Surgery for treating various heart conditions including Myocardial Infarction, Angina, Valve Disease, and Cardiac Arrhythmias.  Written material given at graduation.   Medication Safety: - Group verbal and visual instruction to review commonly prescribed medications for heart and lung disease. Reviews the medication, class of the  drug, and side effects. Includes the steps to properly store meds and maintain the prescription regimen.  Written material given at graduation.   Intimacy: - Group verbal instruction through game format to discuss how heart and lung disease can affect sexual intimacy. Written material given at graduation.. Flowsheet Row Cardiac Rehab from 02/20/2022 in [facility] Cardiac and  Pulmonary Rehab  Date 02/20/22  Educator KW  Instruction Review Code 1- Verbalizes Understanding       Know Your Numbers and Heart Failure: - Group verbal and visual instruction to discuss disease risk factors for cardiac and pulmonary disease and treatment options.  Reviews associated critical values for Overweight/Obesity, Hypertension, Cholesterol, and Diabetes.  Discusses basics of heart failure: signs/symptoms and treatments.  Introduces Heart Failure Zone chart for action plan for heart failure.  Written material given at graduation.   Infection Prevention: - Provides verbal and written material to individual with discussion of infection control including proper hand washing and proper equipment cleaning during exercise session. Flowsheet Row Cardiac Rehab from 02/20/2022 in [facility] Cardiac and Pulmonary Rehab  Education need identified 02/07/22  Date 02/07/22  Educator KW  Instruction Review Code 1- Verbalizes Understanding       Falls Prevention: - Provides verbal and written material to individual with discussion of falls prevention and safety. Flowsheet Row Cardiac Rehab from 02/20/2022 in [facility] Cardiac and Pulmonary Rehab  Education need identified 02/07/22  Date 02/07/22  Educator KW  Instruction Review Code 1- Verbalizes Understanding       Other: -Provides group and verbal instruction on various topics (see comments)   Knowledge Questionnaire Score:  Knowledge Questionnaire Score - 02/07/22 1527       Knowledge Questionnaire Score   Pre Score 22/26             Core Components/Risk Factors/Patient Goals at Admission:  Personal Goals and Risk Factors at Admission - 02/07/22 1551       Core Components/Risk Factors/Patient Goals on Admission    Weight Management Yes;Weight Loss    Intervention Weight Management: Develop a combined nutrition and exercise program designed to reach desired caloric intake, while maintaining appropriate intake of nutrient and  fiber, sodium and fats, and appropriate energy expenditure required for the weight goal.;Weight Management: Provide education and appropriate resources to help participant work on and attain dietary goals.;Weight Management/Obesity: Establish reasonable short term and long term weight goals.    Admit Weight 164 lb (74.4 kg)    Goal Weight: Short Term 160 lb (72.6 kg)    Goal Weight: Long Term 155 lb (70.3 kg)    Expected Outcomes Short Term: Continue to assess and modify interventions until short term weight is achieved;Long Term: Adherence to nutrition and physical activity/exercise program aimed toward attainment of established weight goal;Weight Loss: Understanding of general recommendations for a balanced deficit meal plan, which promotes 1-2 lb weight loss per week and includes a negative energy balance of [id number] kcal/d;Understanding recommendations for meals to include 15-35% energy as protein, 25-35% energy from fat, 35-60% energy from carbohydrates, less than 242m of dietary cholesterol, 20-35 gm of total fiber daily;Understanding of distribution of calorie intake throughout the day with the consumption of 4-5 meals/snacks    Lipids Yes    Intervention Provide education and support for participant on nutrition & aerobic/resistive exercise along with prescribed medications to achieve LDL <772m HDL >4079m   Expected Outcomes Short Term: Participant states understanding of desired cholesterol values and is compliant with medications prescribed. Participant is following exercise  prescription and nutrition guidelines.;Long Term: Cholesterol controlled with medications as prescribed, with individualized exercise RX and with personalized nutrition plan. Value goals: LDL < 48m, HDL > 40 mg.             Education:Diabetes - Individual verbal and written instruction to review signs/symptoms of diabetes, desired ranges of glucose level fasting, after meals and with exercise. Acknowledge that pre  and post exercise glucose checks will be done for 3 sessions at entry of program.   Core Components/Risk Factors/Patient Goals Review:   Goals and Risk Factor Review     Row Name 02/18/22 1348 03/06/22 1346           Core Components/Risk Factors/Patient Goals Review   Personal Goals Review Lipids;Weight Management/Obesity Lipids;Weight Management/Obesity      Review CMalaakis doing well. She does not have scale to use at home so she does not weigh herself besides what she sees at rehab. Funds are limited- encouraged to ask doctor about a financial assistance with getting DME through insurance or somewhere else. Explained importance of checking weight. Her last lipid check is ordered and she was told to get her next check in March. She is taking all medications as prescribed with no issues. CKarelinis doing well. Her biggest complain is that she has been feeling fatigue lately and she is wondering if its from her medications. She was encouraged to reach out to her doctor to determine why. She states she has gained about 2-3 lbs since starting the program and feels discouraged as she has changed her diet and moving more at home. She has a scale now and weighs at home. She is still taking all medications as prescribed. She has a BP cuff at home which ranges around 110-20s/60-70s.      Expected Outcomes Short: Obtain a scale for home Long: Continue to manage lifestyle risk factors Short: Talk to doctor about fatigue side effect Long: Continue to manage lifestyle risk factors               Core Components/Risk Factors/Patient Goals at Discharge (Final Review):   Goals and Risk Factor Review - 03/06/22 1346       Core Components/Risk Factors/Patient Goals Review   Personal Goals Review Lipids;Weight Management/Obesity    Review CFinesseis doing well. Her biggest complain is that she has been feeling fatigue lately and she is wondering if its from her medications. She was encouraged to reach  out to her doctor to determine why. She states she has gained about 2-3 lbs since starting the program and feels discouraged as she has changed her diet and moving more at home. She has a scale now and weighs at home. She is still taking all medications as prescribed. She has a BP cuff at home which ranges around 110-20s/60-70s.    Expected Outcomes Short: Talk to doctor about fatigue side effect Long: Continue to manage lifestyle risk factors             ITP Comments:  ITP Comments     Row Name 02/05/22 1029 02/07/22 1522 02/11/22 1340 02/27/22 1039 03/27/22 1456   ITP Comments Initial phone call completed. [facility] EP Orientation scheduled for Thursday 1/4 at 2pm. Completed 6MWT and gym orientation. Initial ITP created and sent for review to Dr. MEmily Filbert Medical Director. First full day of exercise!  Patient was oriented to gym and equipment including functions, settings, policies, and procedures.  Patient's individual exercise  prescription and treatment plan were reviewed.  All starting workloads were established based on the results of the 6 minute walk test done at initial orientation visit.  The plan for exercise progression was also introduced and progression will be customized based on patient's performance and goals. 30 Day review completed. Medical Director ITP review done, changes made as directed, and signed approval by Medical Director.   new to program 30 day review completed. ITP sent to Dr. Emily Filbert, Medical Director of Cardiac Rehab. Continue with ITP unless changes are made by physician.            Comments: 30 day review

## 2022-03-27 NOTE — Telephone Encounter (Signed)
Spoke to pt. She stated she was able to get refilled through pcp.  Pt also reported for the past coupled of days, she's been experiencing off and on chest tightness with a headache. She is unable to determine precipitating or alleviating factors., but stated it typically just goes away on it's own. Pt denies symptoms currently  Appointment scheduled for 2//26 for further evaluations. Pt made aware to report to ER if symptoms reoccur. Pt verbalized understanding.

## 2022-03-31 NOTE — Progress Notes (Deleted)
Cardiology Office Note  Date:  03/31/2022   ID:  Connie Cameron, DOB November 10, 1960, MRN [id number]  PCP:  Macarthur Critchley, MD   No chief complaint on file.   HPI:  Connie Cameron is a 62 y.o. female with history of  CAD  inferior ST elevation MI on 01/23/2022,  HLD,  head and neck cancer  who presents for hospital follow-up redisease   Prior cardiac history  admitted to [facility] from 12/20-12/22/2023 with an inferior ST elevation MI.   High-sensitivity troponin peaked at 3204.  Post intervention there was some NSVT  LHC showed severe single-vessel CAD with 99% mid RCA stenosis felt to likely be due to acute plaque rupture. 50 to 60% proximal RCA stenosis as well as moderate to severe, but noncritical left coronary artery disease. successful PCI/DES to the proximal and mid RCA using nonoverlapping drug-eluting stents.   jailed RV marginal branch arising from the mid RCA that demonstrated 70% stenosis of pre-PCI that increased to 90% with plaque shift.  There was TIMI II flow at the end of the procedure.  The vessel was a small branch and too small for intervention.     Echo EF of 60 to 65%, no regional wall motion abnormalities, mild LVH, normal LV diastolic function parameters, normal RV systolic function, mild mitral regurgitation, and aortic valve sclerosis without evidence of stenosis.     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  PSH:    Past Surgical History:  Procedure Laterality Date   COLONOSCOPY WITH PROPOFOL N/A 03/30/2020   Procedure: COLONOSCOPY WITH PROPOFOL;  Surgeon: Virgel Manifold, MD;  Location: [facility] ENDOSCOPY;  Service: Endoscopy;  Laterality: N/A;  COVID POSITIVE 02/29/2020   CORONARY/GRAFT ACUTE MI REVASCULARIZATION N/A 01/23/2022   Procedure: Coronary/Graft Acute MI Revascularization;  Surgeon: Nelva Bush, MD;  Location: [place] CV LAB;   Service: Cardiovascular;  Laterality: N/A;   ESOPHAGOGASTRODUODENOSCOPY (EGD) WITH PROPOFOL N/A 03/30/2020   Procedure: ESOPHAGOGASTRODUODENOSCOPY (EGD) WITH PROPOFOL;  Surgeon: Virgel Manifold, MD;  Location: [facility] ENDOSCOPY;  Service: Endoscopy;  Laterality: N/A;   ESOPHAGOGASTRODUODENOSCOPY (EGD) WITH PROPOFOL N/A 08/03/2020   Procedure: ESOPHAGOGASTRODUODENOSCOPY (EGD) WITH PROPOFOL;  Surgeon: Virgel Manifold, MD;  Location: [facility] ENDOSCOPY;  Service: Endoscopy;  Laterality: N/A;   ESOPHAGOGASTRODUODENOSCOPY (EGD) WITH PROPOFOL N/A 01/25/2021   Procedure: ESOPHAGOGASTRODUODENOSCOPY (EGD) WITH PROPOFOL;  Surgeon: Virgel Manifold, MD;  Location: [facility] ENDOSCOPY;  Service: Endoscopy;  Laterality: N/A;   ESOPHAGOGASTRODUODENOSCOPY (EGD) WITH PROPOFOL N/A 09/05/2021   Procedure: ESOPHAGOGASTRODUODENOSCOPY (EGD) WITH PROPOFOL;  Surgeon: Lin Landsman, MD;  Location: [facility] END[facility]COPY;  Service: Gastroenterology;  Laterality: N/A;   EXCISION MASS NECK Left 01/18/2019   Procedure: EXCISION MASS NECK/NODE;  Surgeon: Margaretha Sheffield, MD;  Location: [facility] ORS;  Service: ENT;  Laterality: Left;   IR REMOVAL TUN ACCESS W/ PORT W/O FL MOD SED  12/05/2021   LARYNGOSCOPY Bilateral 02/22/2019   Procedure: MICROSCOPIC DIRECT LARYNGOSCOPY AND BIOPSY;  Surgeon: Margaretha Sheffield, MD;  Location: [facility] ORS;  Service: ENT;  Laterality: Bilateral;   LEFT HEART CATH AND CORONARY ANGIOGRAPHY N/A 01/23/2022   Procedure: LEFT HEART CATH AND CORONARY ANGIOGRAPHY;  Surgeon: Nelva Bush, MD;  Location: [place] CV LAB;  Service: Cardiovascular;  Laterality: N/A;   PORTA CATH INSERTION N/A 03/24/2019   Procedure: PORTA CATH INSERTION;  Surgeon: Katha Cabal, MD;  Location: [place] CV LAB;  Service: Cardiovascular;  Laterality: N/A;   TONSILLECTOMY     TUBAL LIGATION      Current Outpatient Medications  Medication Sig Dispense Refill   aspirin 81 MG chewable tablet Chew 1 tablet (81 mg total) by  mouth daily. 30 tablet 1   atorvastatin (LIPITOR) 80 MG tablet Take 1 tablet (80 mg total) by mouth daily. 90 tablet 0   baclofen (LIORESAL) 10 MG tablet Take 10 mg by mouth at bedtime as needed.     ezetimibe (ZETIA) 10 MG tablet Take 1 tablet (10 mg total) by mouth daily. 90 tablet 0   gabapentin (NEURONTIN) 300 MG capsule Take 300 mg by mouth 2 (two) times daily.     hydrOXYzine (ATARAX/VISTARIL) 25 MG tablet Take 25 mg by mouth daily as needed for nausea, vomiting or anxiety.     meclizine (ANTIVERT) 12.5 MG tablet Take 1 tablet (12.5 mg total) by mouth 3 (three) times daily as needed for dizziness or nausea. 30 tablet 0   metoprolol succinate (TOPROL-XL) 25 MG 24 hr tablet Take 0.5 tablets (12.5 mg total) by mouth daily. Take with or immediately following a meal. 45 tablet 3   pantoprazole (PROTONIX) 40 MG tablet Take 1 tablet (40 mg total) by mouth daily. 30 tablet 1   sertraline (ZOLOFT) 100 MG tablet Take 100 mg by mouth daily.     ticagrelor (BRILINTA) 90 MG TABS tablet Take 1 tablet (90 mg total) by mouth 2 (two) times daily. 60 tablet 3   Vitamin D, Ergocalciferol, (DRISDOL) 1.25 MG (50000 UNIT) CAPS capsule Take 50,000 Units by mouth once a week.     No current facility-administered medications for this visit.     Allergies:   Chlorhexidine   Social History:  The patient  reports that she has never smoked. She has never used smokeless tobacco. She reports that she does not currently use alcohol. She reports that she does not use drugs.   Family History:   family history includes Brain cancer in her paternal grandfather; Breast cancer in her sister.    Review of Systems: ROS   PHYSICAL EXAM: VS:  There were no vitals taken for this visit. , BMI There is no height or weight on file to calculate BMI. GEN: Well nourished, well developed, in no acute distress HEENT: normal Neck: no JVD, carotid bruits, or masses Cardiac: RRR; no murmurs, rubs, or gallops,no edema   Respiratory:  clear to auscultation bilaterally, normal work of breathing GI: soft, nontender, nondistended, + BS MS: no deformity or atrophy Skin: warm and dry, no rash Neuro:  Strength and sensation are intact Psych: euthymic mood, full affect    Recent Labs: 01/25/2022: Magnesium 2.5 02/21/2022: ALT 15; BUN 21; Creatinine, Ser 1.13; Hemoglobin 11.7; Platelets 233; Potassium 4.2; Sodium 139; TSH 3.206    Lipid Panel Lab Results  Component Value Date   CHOL 258 (H) 01/23/2022   HDL 44 01/23/2022   LDLCALC 161 (H) 01/23/2022   TRIG 267 (H) 01/23/2022      Wt Readings from Last 3 Encounters:  02/15/22 163 lb 12.8 oz (74.3 kg)  02/07/22 164 lb 1.6 oz (74.4 kg)  01/23/22 164 lb 3.9 oz (74.5 kg)       ASSESSMENT AND PLAN:  Problem List Items Addressed This Visit   None    Disposition:   F/U  12 months   Total encounter time more than 30 minutes  Greater than 50% was spent in counseling and coordination of care with the patient  Signed, Esmond Plants, M.D., Ph.D. [place], [place]

## 2022-04-01 ENCOUNTER — Encounter: Payer: Self-pay | Admitting: Cardiovascular Disease

## 2022-04-01 ENCOUNTER — Ambulatory Visit: Payer: Medicare Other | Attending: Cardiovascular Disease | Admitting: Cardiovascular Disease

## 2022-04-01 ENCOUNTER — Encounter: Payer: Self-pay | Admitting: *Deleted

## 2022-04-01 DIAGNOSIS — I4729 Other ventricular tachycardia: Secondary | ICD-10-CM

## 2022-04-01 DIAGNOSIS — I213 ST elevation (STEMI) myocardial infarction of unspecified site: Secondary | ICD-10-CM

## 2022-04-01 DIAGNOSIS — R1319 Other dysphagia: Secondary | ICD-10-CM

## 2022-04-01 DIAGNOSIS — E785 Hyperlipidemia, unspecified: Secondary | ICD-10-CM

## 2022-04-01 DIAGNOSIS — C01 Malignant neoplasm of base of tongue: Secondary | ICD-10-CM

## 2022-04-01 DIAGNOSIS — Z955 Presence of coronary angioplasty implant and graft: Secondary | ICD-10-CM

## 2022-04-01 DIAGNOSIS — I25118 Atherosclerotic heart disease of native coronary artery with other forms of angina pectoris: Secondary | ICD-10-CM

## 2022-04-01 NOTE — Progress Notes (Signed)
Cardiac Individual Treatment Plan  Patient Details  Name: Connie Cameron MRN: [id number] Date of Birth: [birth year] Referring Provider:   Flowsheet Row Cardiac Rehab from 02/07/2022 in [facility] Cardiac and Pulmonary Rehab  Referring Provider End, Harrell Gave MD       Initial Encounter Date:  Flowsheet Row Cardiac Rehab from 02/07/2022 in [facility] Cardiac and Pulmonary Rehab  Date 02/07/22       Visit Diagnosis: ST elevation myocardial infarction (STEMI), unspecified artery [facility])  Status post coronary artery stent placement  Patient's Home Medications on Admission:  Current Outpatient Medications:    aspirin 81 MG chewable tablet, Chew 1 tablet (81 mg total) by mouth daily., Disp: 30 tablet, Rfl: 1   atorvastatin (LIPITOR) 80 MG tablet, Take 1 tablet (80 mg total) by mouth daily., Disp: 90 tablet, Rfl: 0   baclofen (LIORESAL) 10 MG tablet, Take 10 mg by mouth at bedtime as needed., Disp: , Rfl:    ezetimibe (ZETIA) 10 MG tablet, Take 1 tablet (10 mg total) by mouth daily., Disp: 90 tablet, Rfl: 0   gabapentin (NEURONTIN) 300 MG capsule, Take 300 mg by mouth 2 (two) times daily., Disp: , Rfl:    hydrOXYzine (ATARAX/VISTARIL) 25 MG tablet, Take 25 mg by mouth daily as needed for nausea, vomiting or anxiety., Disp: , Rfl:    meclizine (ANTIVERT) 12.5 MG tablet, Take 1 tablet (12.5 mg total) by mouth 3 (three) times daily as needed for dizziness or nausea., Disp: 30 tablet, Rfl: 0   metoprolol succinate (TOPROL-XL) 25 MG 24 hr tablet, Take 0.5 tablets (12.5 mg total) by mouth daily. Take with or immediately following a meal., Disp: 45 tablet, Rfl: 3   pantoprazole (PROTONIX) 40 MG tablet, Take 1 tablet (40 mg total) by mouth daily., Disp: 30 tablet, Rfl: 1   sertraline (ZOLOFT) 100 MG tablet, Take 100 mg by mouth daily., Disp: , Rfl:    ticagrelor (BRILINTA) 90 MG TABS tablet, Take 1 tablet (90 mg total) by mouth 2 (two) times daily., Disp: 60 tablet, Rfl: 3   Vitamin D, Ergocalciferol, (DRISDOL)  1.25 MG (50000 UNIT) CAPS capsule, Take 50,000 Units by mouth once a week., Disp: , Rfl:   Past Medical History: Past Medical History:  Diagnosis Date   Anxiety    Depression    GERD (gastroesophageal reflux disease)    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   Exercise Prescription Goal: Initial exercise prescription builds to 30-45 minutes a day of aerobic activity, 2-3 days per week.  Home exercise guidelines will be given to patient during program as part of exercise prescription that the participant will acknowledge.   Education: Aerobic Exercise: - Group verbal and visual presentation on the components of exercise prescription. Introduces F.I.T.T principle from ACSM for exercise prescriptions.  Reviews F.I.T.T. principles of aerobic  exercise including progression. Written material given at graduation. Flowsheet Row Cardiac Rehab from 02/20/2022 in [facility] Cardiac and Pulmonary Rehab  Education need identified 02/07/22  Date 02/20/22  Educator KW  Instruction Review Code 1- [employer] Understanding       Education: Resistance Exercise: - Group verbal and visual presentation on the components of exercise prescription. Introduces F.I.T.T principle from ACSM for exercise prescriptions  Reviews F.I.T.T. principles of resistance exercise including progression. Written material given at graduation. Flowsheet Row Cardiac Rehab  from 02/20/2022 in [facility] Cardiac and Pulmonary Rehab  Education need identified 02/07/22        Education: Exercise & Equipment Safety: - Individual verbal instruction and demonstration of equipment use and safety with use of the equipment. Flowsheet Row Cardiac Rehab from 02/20/2022 in [facility] Cardiac and Pulmonary Rehab  Education need identified 02/07/22  Date 02/07/22  Educator KW  Instruction Review Code 1- Verbalizes Understanding       Education: Exercise Physiology & General Exercise Guidelines: - Group verbal and written instruction with models to review the exercise physiology of the cardiovascular system and associated critical values. Provides general exercise guidelines with specific guidelines to those with heart or lung disease.  Flowsheet Row Cardiac Rehab from 02/20/2022 in [facility] Cardiac and Pulmonary Rehab  Education need identified 02/07/22       Education: Flexibility, Balance, Mind/Body Relaxation: - Group verbal and visual presentation with interactive activity on the components of exercise prescription. Introduces F.I.T.T principle from ACSM for exercise prescriptions. Reviews F.I.T.T. principles of flexibility and balance exercise training including progression. Also discusses the mind body connection.  Reviews various relaxation techniques to help reduce and manage stress (i.e. Deep breathing, progressive muscle relaxation, and visualization). Balance handout provided to take home. Written material given at graduation.   Activity Barriers & Risk Stratification:  Activity Barriers & Cardiac Risk Stratification - 02/07/22 1544       Activity Barriers & Cardiac Risk Stratification   Activity Barriers Joint Problems;Muscular Weakness   Right knee scope completed 01/17/22   Cardiac Risk Stratification Moderate             6 Minute Walk:  6 Minute Walk     Row Name 02/07/22 1545         6 Minute Walk   Phase Initial     Distance 1345 feet     Walk Time  6 minutes     # of Rest Breaks 0     MPH 2.54     METS 3.18     RPE 11     Perceived Dyspnea  1     VO2 Peak 11.16     Symptoms No     Resting HR 72 bpm     Resting BP 102/66     Resting Oxygen Saturation  99 %     Exercise Oxygen Saturation  during 6 min walk 96 %     Max Ex. HR 98 bpm     Max Ex. BP 124/64     2 Minute Post BP 118/68              Oxygen Initial Assessment:   Oxygen Re-Evaluation:   Oxygen Discharge (Final Oxygen Re-Evaluation):   Initial Exercise Prescription:  Initial Exercise Prescription - 02/07/22 1500       Date of Initial Exercise RX and Referring Provider   Date 02/07/22    Referring Provider End, Harrell Gave MD      Oxygen   Maintain Oxygen Saturation  88% or higher      Treadmill   MPH 2.5    Grade 0.5    Minutes 15    METs 3.09      NuStep   Level 3    SPM 80    Minutes 15    METs 3.18      REL-XR   Level 2    Speed 50    Minutes 15    METs 3.18      Prescription Details   Frequency (times per week) 3    Duration Progress to 30 minutes of continuous aerobic without signs/symptoms of physical distress      Intensity   THRR 40-80% of Max Heartrate 106 - 141    Ratings of Perceived Exertion 11-13    Perceived Dyspnea 0-4      Progression   Progression Continue to progress workloads to maintain intensity without signs/symptoms of physical distress.      Resistance Training   Training Prescription Yes    Weight 4 lb    Reps 10-15             Perform Capillary Blood Glucose checks as needed.  Exercise Prescription Changes:   Exercise Prescription Changes     Row Name 02/07/22 1500 02/13/22 1300 02/18/22 1400 02/28/22 1400 03/12/22 1500     Response to Exercise   Blood Pressure (Admit) 102/66 110/64 -- 98/60 126/64   Blood Pressure (Exercise) 124/64 128/62 -- 124/68 126/72   Blood Pressure (Exit) 118/68 100/68 -- 106/68 104/64   Heart Rate (Admit) 72 bpm 81 bpm -- 74 bpm 64 bpm   Heart Rate (Exercise)  98 bpm 108 bpm -- 120 bpm 108 bpm   Heart Rate (Exit) 71 bpm 79 bpm -- 82 bpm 75 bpm   Oxygen Saturation (Admit) 98 % -- -- -- --   Oxygen Saturation (Exercise) 96 % -- -- -- --   Rating of Perceived Exertion (Exercise) 11 11 -- 13 14   Perceived Dyspnea (Exercise) 1 -- -- -- --   Symptoms none none -- none none   Comments walk test results first full day of exercise -- -- --   Duration -- Continue with 30 min of aerobic exercise without signs/symptoms of physical distress. -- Continue with 30 min of aerobic exercise without signs/symptoms of physical distress. Continue with 30 min of aerobic exercise without signs/symptoms of physical distress.   Intensity -- THRR unchanged -- THRR unchanged THRR unchanged     Progression   Progression -- Continue to progress workloads to maintain intensity without signs/symptoms of physical distress. -- Continue to progress workloads to maintain intensity without signs/symptoms of physical distress. Continue to progress workloads to maintain intensity without signs/symptoms of physical distress.   Average METs -- 3.04 -- 3.13 3.2     Resistance Training   Training Prescription -- Yes -- Yes Yes   Weight -- 4 lb -- 4 lb 4 lb   Reps -- 10-15 -- 10-15 10-15     Interval Training   Interval Training -- No -- No No     Treadmill   MPH -- 2.5 -- 2.5 2.5   Grade -- 0.5 -- 1 0.5   Minutes -- 15 -- 15 15   METs -- 3.09 -- 3.26 3.09     NuStep   Level -- -- -- 4 4   Minutes -- -- -- 15 15   METs -- -- -- 3.5 3.4     REL-XR  Level -- 1 -- 3 3   Minutes -- 15 -- 15 15   METs -- -- -- 3.2 --     Home Exercise Plan   Plans to continue exercise at -- -- Home (comment)  recumbent bike, walking Home (comment)  recumbent bike, walking Home (comment)  recumbent bike, walking   Frequency -- -- Add 2 additional days to program exercise sessions. Add 2 additional days to program exercise sessions. Add 2 additional days to program exercise sessions.   Initial  Home Exercises Provided -- -- 02/18/22 02/18/22 02/18/22     Oxygen   Maintain Oxygen Saturation -- 88% or higher 88% or higher 88% or higher 88% or higher            Exercise Comments:   Exercise Comments     Row Name 02/11/22 1341           Exercise Comments First full day of exercise!  Patient was oriented to gym and equipment including functions, settings, policies, and procedures.  Patient's individual exercise prescription and treatment plan were reviewed.  All starting workloads were established based on the results of the 6 minute walk test done at initial orientation visit.  The plan for exercise progression was also introduced and progression will be customized based on patient's performance and goals.                Exercise Goals and Review:   Exercise Goals     Row Name 02/07/22 1549             Exercise Goals   Increase Physical Activity Yes       Intervention Provide advice, education, support and counseling about physical activity/exercise needs.;Develop an individualized exercise prescription for aerobic and resistive training based on initial evaluation findings, risk stratification, comorbidities and participant's personal goals.       Expected Outcomes Short Term: Attend rehab on a regular basis to increase amount of physical activity.;Long Term: Add in home exercise to make exercise part of routine and to increase amount of physical activity.;Long Term: Exercising regularly at least 3-5 days a week.       Increase Strength and Stamina Yes       Intervention Provide advice, education, support and counseling about physical activity/exercise needs.;Develop an individualized exercise prescription for aerobic and resistive training based on initial evaluation findings, risk stratification, comorbidities and participant's personal goals.       Expected Outcomes Short Term: Increase workloads from initial exercise prescription for resistance, speed, and  METs.;Short Term: Perform resistance training exercises routinely during rehab and add in resistance training at home;Long Term: Improve cardiorespiratory fitness, muscular endurance and strength as measured by increased METs and functional capacity (6MWT)       Able to understand and use rate of perceived exertion (RPE) scale Yes       Intervention Provide education and explanation on how to use RPE scale       Expected Outcomes Short Term: Able to use RPE daily in rehab to express subjective intensity level;Long Term:  Able to use RPE to guide intensity level when exercising independently       Able to understand and use Dyspnea scale Yes       Intervention Provide education and explanation on how to use Dyspnea scale       Expected Outcomes Short Term: Able to use Dyspnea scale daily in rehab to express subjective sense of shortness of breath during exertion;Long Term: Able to  use Dyspnea scale to guide intensity level when exercising independently       Knowledge and understanding of Target Heart Rate Range (THRR) Yes       Intervention Provide education and explanation of THRR including how the numbers were predicted and where they are located for reference       Expected Outcomes Short Term: Able to state/look up THRR;Long Term: Able to use THRR to govern intensity when exercising independently;Short Term: Able to use daily as guideline for intensity in rehab       Able to check pulse independently Yes       Intervention Provide education and demonstration on how to check pulse in carotid and radial arteries.;Review the importance of being able to check your own pulse for safety during independent exercise       Expected Outcomes Long Term: Able to check pulse independently and accurately;Short Term: Able to explain why pulse checking is important during independent exercise       Understanding of Exercise Prescription Yes       Intervention Provide education, explanation, and written materials  on patient's individual exercise prescription       Expected Outcomes Short Term: Able to explain program exercise prescription;Long Term: Able to explain home exercise prescription to exercise independently                Exercise Goals Re-Evaluation :  Exercise Goals Re-Evaluation     Row Name 02/11/22 1341 02/13/22 1356 02/18/22 1408 02/28/22 1450 03/06/22 1340     Exercise Goal Re-Evaluation   Exercise Goals Review Increase Physical Activity;Able to understand and use rate of perceived exertion (RPE) scale;Knowledge and understanding of Target Heart Rate Range (THRR);Understanding of Exercise Prescription;Increase Strength and Stamina;Able to check pulse independently;Improve claudication pain tolerance and improve walking ability Increase Physical Activity;Increase Strength and Stamina;Understanding of Exercise Prescription Increase Physical Activity;Increase Strength and Stamina;Understanding of Exercise Prescription Increase Physical Activity;Increase Strength and Stamina;Understanding of Exercise Prescription Increase Physical Activity;Increase Strength and Stamina;Understanding of Exercise Prescription   Comments Reviewed RPE scale, THR and program prescription with pt today.  Pt voiced understanding and was given a copy of goals to take home. Maribel is doing well for her first session of exercise at rehab. She was able to exercise at her initial exercise prescription and had appropriate RPEs are to follow. We will continue to monitor as she progresses in the program. Reviewed home exercise with pt today.  Pt plans to walk and ride her recumbent bike for exercise.  Patient would like to join a gym but states she wouldn't be able to afford it. Encouraged her to reach out to [facility] to see if they can use a scholarship program or something to help financially. Reviewed THR, pulse, RPE, sign and symptoms, pulse oximetery and when to call 911 or MD.  Also discussed weather considerations and  indoor options.  Pt voiced understanding. Bibi is doing well in the program. She recently improved her overall average MET level to 3.13 METs. She also improved to level 4 on the T4 and level 3 on XR. She increased her incline on the treadmill to 1% while maintaining a speed of 2.5 mph as well. We will continue to monitor his progress in the program. Loyalti is participating in home exercise well. She is exercising about 15 minutes on her recumbent bike and then walking about 30 minutes outdoors. She is sure she is not outside when it is too cold. She is exercising an extra  2 days outside of rehab and states she is tolerating it well. She has not been checking her HR, however, as she does not have a pulse ox and may not be able to afford one due to financial issues. We spoke about how to check HR manually and encouraged her to ask her doctor to see if she can get one discounted or somehow through insurance.   Expected Outcomes Short: Use RPE daily to regulate intensity.  Long: Follow program prescription in THR. Short: Continue to follow exercise prescription Long: Build up overall strength and stamina Short: Add on 1 day of structured exercise at home, break up minutes between walking and biking Long: Continue to exercise independeently at home at appropriate prescription Short: Continue to increase workload on the treadmill. Long: Continue to increase strength and stamina. Short: Start checking HR manually Long: Continue to exercise independently    [place] Name 03/12/22 1537 03/28/22 1421           Exercise Goal Re-Evaluation   Exercise Goals Review Increase Physical Activity;Increase Strength and Stamina;Understanding of Exercise Prescription Increase Physical Activity;Increase Strength and Stamina;Understanding of Exercise Prescription      Comments Ellean continues to do well in rehab. She has been staying consistent with her workloads on the XR, T4, and treadmill. We will encourage patient to start  increasing her workloads again. Fortunately, she has been hitting her THR most sessions. Will continue to monitor. Syndney has not attended rehab since the last review. She has been out due to her being out of town. We will continue to monitor her progress in the program once she returns to rehab.      Expected Outcomes Short: Increase speed on treadmill, level on T4 Long: Continue to increase overall MET level and stamina Short: Return to regular attendance in the program. Long: Continue to improve strength and stamina.               Discharge Exercise Prescription (Final Exercise Prescription Changes):  Exercise Prescription Changes - 03/12/22 1500       Response to Exercise   Blood Pressure (Admit) 126/64    Blood Pressure (Exercise) 126/72    Blood Pressure (Exit) 104/64    Heart Rate (Admit) 64 bpm    Heart Rate (Exercise) 108 bpm    Heart Rate (Exit) 75 bpm    Rating of Perceived Exertion (Exercise) 14    Symptoms none    Duration Continue with 30 min of aerobic exercise without signs/symptoms of physical distress.    Intensity THRR unchanged      Progression   Progression Continue to progress workloads to maintain intensity without signs/symptoms of physical distress.    Average METs 3.2      Resistance Training   Training Prescription Yes    Weight 4 lb    Reps 10-15      Interval Training   Interval Training No      Treadmill   MPH 2.5    Grade 0.5    Minutes 15    METs 3.09      NuStep   Level 4    Minutes 15    METs 3.4      REL-XR   Level 3    Minutes 15      Home Exercise Plan   Plans to continue exercise at Home (comment)   recumbent bike, walking   Frequency Add 2 additional days to program exercise sessions.    Initial Home Exercises Provided 02/18/22  Oxygen   Maintain Oxygen Saturation 88% or higher             Nutrition:  Target Goals: Understanding of nutrition guidelines, daily intake of sodium '1500mg'$ , cholesterol '200mg'$ ,  calories 30% from fat and 7% or less from saturated fats, daily to have 5 or more servings of fruits and vegetables.  Education: All About Nutrition: -Group instruction provided by verbal, written material, interactive activities, discussions, models, and posters to present general guidelines for heart healthy nutrition including fat, fiber, MyPlate, the role of sodium in heart healthy nutrition, utilization of the nutrition label, and utilization of this knowledge for meal planning. Follow up email sent as well. Written material given at graduation. Flowsheet Row Cardiac Rehab from 02/20/2022 in [facility] Cardiac and Pulmonary Rehab  Education need identified 02/07/22       Biometrics:  Pre Biometrics - 02/07/22 1545       Pre Biometrics   Height 5' 2.2" (1.58 m)    Weight 164 lb 1.6 oz (74.4 kg)    Waist Circumference 41.5 inches    Hip Circumference 43.5 inches    Waist to Hip Ratio 0.95 %    BMI (Calculated) 29.82    Single Leg Stand 5.15 seconds              Nutrition Therapy Plan and Nutrition Goals:  Nutrition Therapy & Goals - 02/07/22 1553       Intervention Plan   Intervention Prescribe, educate and counsel regarding individualized specific dietary modifications aiming towards targeted core components such as weight, hypertension, lipid management, diabetes, heart failure and other comorbidities.    Expected Outcomes Short Term Goal: Understand basic principles of dietary content, such as calories, fat, sodium, cholesterol and nutrients.;Short Term Goal: A plan has been developed with personal nutrition goals set during dietitian appointment.;Long Term Goal: Adherence to prescribed nutrition plan.             Nutrition Assessments:  MEDIFICTS Score Key: ?70 Need to make dietary changes  40-70 Heart Healthy Diet ? 40 Therapeutic Level Cholesterol Diet  Flowsheet Row Cardiac Rehab from 02/07/2022 in [facility] Cardiac and Pulmonary Rehab  Picture Your Plate Total Score  on Admission 61      Picture Your Plate Scores: [id number] Unhealthy dietary pattern with much room for improvement. 41-50 Dietary pattern unlikely to meet recommendations for good health and room for improvement. 51-60 More healthful dietary pattern, with some room for improvement.  >60 Healthy dietary pattern, although there may be some specific behaviors that could be improved.    Nutrition Goals Re-Evaluation:  Nutrition Goals Re-Evaluation     [place] Name 02/18/22 1344 03/06/22 1342           Goals   Nutrition Goal -- Melissa, the RD continues to be out of office and therefore has not met with the patient to establish individualized goals.      Comment The RD is out on medical leave and had unable to meet with patient at this time. With small discussion, patient states she is eating more salads by increasing her greens, she is eliminating mostly all red meat and eating more lean proteins like chicken and [place]. She stopped eating fried food. She is eating a low sodium diet as she is checking the label more. Potato chips and pickles were her weakness - discussed finding low sodium chips and/or eliminating the portions. Overall, Stevee states she is eating more salads to help increase her vegetable intake. She is using  light dressing and watches her portion sizes. For protein, she eats  chicken, salmon, tuna and no red meat. She has replaced ground beef with ground [place]. She also does not eat any access sugar. Lastly, she has focused more on drinking more water and states she drinks around 60+ ounces / day.      Expected Outcome Short: Meet with RD and establish specific goals, continue to work on low sodium and red meats Long: Continue to eat heart healthy diet Short: Meet with RD to establish goals Long: Continue to eat heart healthy               Nutrition Goals Discharge (Final Nutrition Goals Re-Evaluation):  Nutrition Goals Re-Evaluation - 03/06/22 1342       Goals   Nutrition  Goal Melissa, the RD continues to be out of office and therefore has not met with the patient to establish individualized goals.    Comment Overall, Noelle states she is eating more salads to help increase her vegetable intake. She is using light dressing and watches her portion sizes. For protein, she eats  chicken, salmon, tuna and no red meat. She has replaced ground beef with ground [place]. She also does not eat any access sugar. Lastly, she has focused more on drinking more water and states she drinks around 60+ ounces / day.    Expected Outcome Short: Meet with RD to establish goals Long: Continue to eat heart healthy             Psychosocial: Target Goals: Acknowledge presence or absence of significant depression and/or stress, maximize coping skills, provide positive support system. Participant is able to verbalize types and ability to use techniques and skills needed for reducing stress and depression.   Education: Stress, Anxiety, and Depression - Group verbal and visual presentation to define topics covered.  Reviews how body is impacted by stress, anxiety, and depression.  Also discusses healthy ways to reduce stress and to treat/manage anxiety and depression.  Written material given at graduation.   Education: Sleep Hygiene -Provides group verbal and written instruction about how sleep can affect your health.  Define sleep hygiene, discuss sleep cycles and impact of sleep habits. Review good sleep hygiene tips.    Initial Review & Psychosocial Screening:  Initial Psych Review & Screening - 02/05/22 1015       Initial Review   Current issues with History of Depression      Family Dynamics   Good Support System? Yes   son, roommate     Barriers   Psychosocial barriers to participate in program There are no identifiable barriers or psychosocial needs.;The patient should benefit from training in stress management and relaxation.      Screening Interventions    Interventions Encouraged to exercise;Provide feedback about the scores to participant;To provide support and resources with identified psychosocial needs    Expected Outcomes Long Term Goal: Stressors or current issues are controlled or eliminated.;Short Term goal: Utilizing psychosocial counselor, staff and physician to assist with identification of specific Stressors or current issues interfering with healing process. Setting desired goal for each stressor or current issue identified.;Short Term goal: Identification and review with participant of any Quality of Life or Depression concerns found by scoring the questionnaire.;Long Term goal: The participant improves quality of Life and PHQ9 Scores as seen by post scores and/or verbalization of changes             Quality of Life Scores:   Quality of  Life - 02/07/22 1527       Quality of Life   Select Quality of Life      Quality of Life Scores   Health/Function Pre 21.5 %    Socioeconomic Pre 22.79 %    Psych/Spiritual Pre 25.29 %    Family Pre 27 %    GLOBAL Pre 23.3 %            Scores of 19 and below usually indicate a poorer quality of life in these areas.  A difference of  2-3 points is a clinically meaningful difference.  A difference of 2-3 points in the total score of the Quality of Life Index has been associated with significant improvement in overall quality of life, self-image, physical symptoms, and general health in studies assessing change in quality of life.  PHQ-9: Review Flowsheet       02/07/2022  Depression screen PHQ 2/9  Decreased Interest 0  Down, Depressed, Hopeless 0  PHQ - 2 Score 0  Altered sleeping 0  Tired, decreased energy 1  Change in appetite 0  Feeling bad or failure about yourself  0  Trouble concentrating 0  Moving slowly or fidgety/restless 0  Suicidal thoughts 0  PHQ-9 Score 1  Difficult doing work/chores Not difficult at all   Interpretation of Total Score  Total Score Depression  Severity:  1-4 = Minimal depression, 5-9 = Mild depression, 10-14 = Moderate depression, 15-19 = Moderately severe depression, 20-27 = Severe depression   Psychosocial Evaluation and Intervention:  Psychosocial Evaluation - 02/05/22 1031       Psychosocial Evaluation & Interventions   Interventions Encouraged to exercise with the program and follow exercise prescription    Comments Ludora 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 She reports no chest pain, but feeling sluggish so she is trying to rest. She is on disability after oral cancer a few years ago. She has her son and roommate as her support system. She does have a history of anxiety, depression, adn stress. She states she is doing well in that area, and after her heart attack she has decided she doesn't want to let anything stress her out any more. She is working on staying calm. She wants to come exercise and learn more about heart healthy living    Expected Outcomes Short: attend cardiac rehab for exercise and education. Long: Develop and maintain positive self care habits.    Continue Psychosocial Services  Follow up required by staff             Psychosocial Re-Evaluation:  Psychosocial Re-Evaluation     Lehr Name 02/18/22 1353 03/06/22 1350           Psychosocial Re-Evaluation   Current issues with Current Sleep Concerns;History of Depression;Current Stress Concerns Current Sleep Concerns;History of Depression;Current Stress Concerns      Comments Ernestyne states she is doing well mentally- she has good support from her son and roommate and living situation at home is fine. She continues to do crafts and paint for stress relief as she states she does she stressed easily. We talked about trying progresive musclar relaxation or meditiations she can find on Youtube. She is willing to try. 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 She states she sleeps well but has a hard time falling asleep. She  did admit to being on her screems before bed. Besides eliminating screen time, she will ask her doctor on maybe trying Melatonin or something else to help her fall asleep. Seaira continues to take Zoloft and feels it still helps her as she said she doesnt get so upset so easily and it keeps her more mellow.  She continues to do arts and crafts for stress management. She did try to  eliminate screen time before bed but says it hasn't really helped her sleep quality. She is not interested in taking anything else and says its manageable for now. There are no other concerns or changes for her at this time.      Expected Outcomes Short: Ask doctor about sleep, continue coming to rehab Long: Continue to utilize exercise for stress management and continue to maintain positive attitude Short: Continue coming to rehab and notify staff of any changes Long: Continue to maintain positive attitude      Interventions Encouraged to attend Cardiac Rehabilitation for the exercise Encouraged to attend Cardiac Rehabilitation for the exercise      Continue Psychosocial Services  Follow up required by staff Follow up required by staff               Psychosocial Discharge (Final Psychosocial Re-Evaluation):  Psychosocial Re-Evaluation - 03/06/22 1350       Psychosocial Re-Evaluation   Current issues with Current Sleep Concerns;History of Depression;Current Stress Concerns    Comments Lacourtney continues to take Zoloft and feels it still helps her as she said she doesnt get so upset so easily and it keeps her more mellow.  She continues to do arts and crafts for stress management. She did try to  eliminate screen time before bed but says it hasn't really helped her sleep quality. She is not interested in taking anything else and says its manageable for now. There are no other concerns or changes for her at  this time.    Expected Outcomes Short: Continue coming to rehab and notify staff of any changes Long: Continue to maintain positive attitude    Interventions Encouraged to attend Cardiac Rehabilitation for the exercise    Continue Psychosocial Services  Follow up required by staff             Vocational Rehabilitation: Provide vocational rehab assistance to qualifying candidates.   Vocational Rehab Evaluation & Intervention:  Vocational Rehab - 02/05/22 1018       Initial Vocational Rehab Evaluation & Intervention   Assessment shows need for Vocational Rehabilitation Yes             Education: Education Goals: Education classes will be provided on a variety of topics geared toward better understanding of heart health and risk factor modification. Participant will state understanding/return demonstration of topics presented as noted by education test scores.  Learning Barriers/Preferences:  Learning Barriers/Preferences - 02/05/22 1015       Learning Barriers/Preferences   Learning Barriers None    Learning Preferences Individual Instruction             General Cardiac Education Topics:  AED/CPR: - Group verbal and written instruction with the use of models to demonstrate the basic use of the AED with the basic ABC's of resuscitation.   Anatomy and Cardiac Procedures: - Group verbal and visual presentation and models provide information about basic cardiac anatomy and function. Reviews the testing methods done to diagnose heart disease and the outcomes of the test results. Describes the treatment choices: Medical  Management, Angioplasty, or Coronary Bypass Surgery for treating various heart conditions including Myocardial Infarction, Angina, Valve Disease, and Cardiac Arrhythmias.  Written material given at graduation.   Medication Safety: - Group verbal and visual instruction to review commonly prescribed medications for heart and lung disease. Reviews the  medication, class of the drug, and side effects. Includes the steps to properly store meds and maintain the prescription regimen.  Written material given at graduation.   Intimacy: - Group verbal instruction through game format to discuss how heart and lung disease can affect sexual intimacy. Written material given at graduation.. Flowsheet Row Cardiac Rehab from 02/20/2022 in [facility] Cardiac and Pulmonary Rehab  Date 02/20/22  Educator KW  Instruction Review Code 1- Verbalizes Understanding       Know Your Numbers and Heart Failure: - Group verbal and visual instruction to discuss disease risk factors for cardiac and pulmonary disease and treatment options.  Reviews associated critical values for Overweight/Obesity, Hypertension, Cholesterol, and Diabetes.  Discusses basics of heart failure: signs/symptoms and treatments.  Introduces Heart Failure Zone chart for action plan for heart failure.  Written material given at graduation.   Infection Prevention: - Provides verbal and written material to individual with discussion of infection control including proper hand washing and proper equipment cleaning during exercise session. Flowsheet Row Cardiac Rehab from 02/20/2022 in [facility] Cardiac and Pulmonary Rehab  Education need identified 02/07/22  Date 02/07/22  Educator KW  Instruction Review Code 1- Verbalizes Understanding       Falls Prevention: - Provides verbal and written material to individual with discussion of falls prevention and safety. Flowsheet Row Cardiac Rehab from 02/20/2022 in [facility] Cardiac and Pulmonary Rehab  Education need identified 02/07/22  Date 02/07/22  Educator KW  Instruction Review Code 1- Verbalizes Understanding       Other: -Provides group and verbal instruction on various topics (see comments)   Knowledge Questionnaire Score:  Knowledge Questionnaire Score - 02/07/22 1527       Knowledge Questionnaire Score   Pre Score 22/26             Core  Components/Risk Factors/Patient Goals at Admission:  Personal Goals and Risk Factors at Admission - 02/07/22 1551       Core Components/Risk Factors/Patient Goals on Admission    Weight Management Yes;Weight Loss    Intervention Weight Management: Develop a combined nutrition and exercise program designed to reach desired caloric intake, while maintaining appropriate intake of nutrient and fiber, sodium and fats, and appropriate energy expenditure required for the weight goal.;Weight Management: Provide education and appropriate resources to help participant work on and attain dietary goals.;Weight Management/Obesity: Establish reasonable short term and long term weight goals.    Admit Weight 164 lb (74.4 kg)    Goal Weight: Short Term 160 lb (72.6 kg)    Goal Weight: Long Term 155 lb (70.3 kg)    Expected Outcomes Short Term: Continue to assess and modify interventions until short term weight is achieved;Long Term: Adherence to nutrition and physical activity/exercise program aimed toward attainment of established weight goal;Weight Loss: Understanding of general recommendations for a balanced deficit meal plan, which promotes 1-2 lb weight loss per week and includes a negative energy balance of [phone] kcal/d;Understanding recommendations for meals to include 15-35% energy as protein, 25-35% energy from fat, 35-60% energy from carbohydrates, less than '200mg'$  of dietary cholesterol, 20-35 gm of total fiber daily;Understanding of distribution of calorie intake throughout the day with the consumption of 4-5 meals/snacks  Lipids Yes    Intervention Provide education and support for participant on nutrition & aerobic/resistive exercise along with prescribed medications to achieve LDL '70mg'$ , HDL >'40mg'$ .    Expected Outcomes Short Term: Participant states understanding of desired cholesterol values and is compliant with medications prescribed. Participant is following exercise prescription and nutrition  guidelines.;Long Term: Cholesterol controlled with medications as prescribed, with individualized exercise RX and with personalized nutrition plan. Value goals: LDL < '70mg'$ , HDL > 40 mg.             Education:Diabetes - Individual verbal and written instruction to review signs/symptoms of diabetes, desired ranges of glucose level fasting, after meals and with exercise. Acknowledge that pre and post exercise glucose checks will be done for 3 sessions at entry of program.   Core Components/Risk Factors/Patient Goals Review:   Goals and Risk Factor Review     Row Name 02/18/22 1348 03/06/22 1346           Core Components/Risk Factors/Patient Goals Review   Personal Goals Review Lipids;Weight Management/Obesity Lipids;Weight Management/Obesity      Review Meladee is doing well. She does not have scale to use at home so she does not weigh herself besides what she sees at rehab. Funds are limited- encouraged to ask doctor about a financial assistance with getting DME through insurance or somewhere else. Explained importance of checking weight. Her last lipid check is ordered and she was told to get her next check in March. She is taking all medications as prescribed with no issues. Garla is doing well. Her biggest complain is that she has been feeling fatigue lately and she is wondering if its from her medications. She was encouraged to reach out to her doctor to determine why. She states she has gained about 2-3 lbs since starting the program and feels discouraged as she has changed her diet and moving more at home. She has a scale now and weighs at home. She is still taking all medications as prescribed. She has a BP cuff at home which ranges around 110-20s/60-70s.      Expected Outcomes Short: Obtain a scale for home Long: Continue to manage lifestyle risk factors Short: Talk to doctor about fatigue side effect Long: Continue to manage lifestyle risk factors               Core  Components/Risk Factors/Patient Goals at Discharge (Final Review):   Goals and Risk Factor Review - 03/06/22 1346       Core Components/Risk Factors/Patient Goals Review   Personal Goals Review Lipids;Weight Management/Obesity    Review Che is doing well. Her biggest complain is that she has been feeling fatigue lately and she is wondering if its from her medications. She was encouraged to reach out to her doctor to determine why. She states she has gained about 2-3 lbs since starting the program and feels discouraged as she has changed her diet and moving more at home. She has a scale now and weighs at home. She is still taking all medications as prescribed. She has a BP cuff at home which ranges around 110-20s/60-70s.    Expected Outcomes Short: Talk to doctor about fatigue side effect Long: Continue to manage lifestyle risk factors             ITP Comments:  ITP Comments     Row Name 02/05/22 1029 02/07/22 1522 02/11/22 1340 02/27/22 1039 03/27/22 1456   ITP Comments Initial phone call completed. Diagnosis can be found in  [facility] 12/20. EP Orientation scheduled for Thursday 1/4 at 2pm. Completed 6MWT and gym orientation. Initial ITP created and sent for review to Dr. Emily Filbert, Medical Director. First full day of exercise!  Patient was oriented to gym and equipment including functions, settings, policies, and procedures.  Patient's individual exercise prescription and treatment plan were reviewed.  All starting workloads were established based on the results of the 6 minute walk test done at initial orientation visit.  The plan for exercise progression was also introduced and progression will be customized based on patient's performance and goals. 30 Day review completed. Medical Director ITP review done, changes made as directed, and signed approval by Medical Director.   new to program 30 day review completed. ITP sent to Dr. Emily Filbert, Medical Director of Cardiac Rehab. Continue with ITP  unless changes are made by physician.    Abrams Name 04/01/22 1040           ITP Comments Patient called and left a message on Friday evening stating that she does not want to return to rehab at this time. We will discharge her from the program at this time.                Comments: Discharge ITP

## 2022-04-01 NOTE — Progress Notes (Signed)
Discharge Summary: Connie Cameron 1960-09-12   [name] discharged today from  rehab with 10 sessions completed, per patient request.   6 Minute Walk     Row Name 02/07/22 1545         6 Minute Walk   Phase Initial     Distance 1345 feet     Walk Time 6 minutes     # of Rest Breaks 0     MPH 2.54     METS 3.18     RPE 11     Perceived Dyspnea  1     VO2 Peak 11.16     Symptoms No     Resting HR 72 bpm     Resting BP 102/66     Resting Oxygen Saturation  99 %     Exercise Oxygen Saturation  during 6 min walk 96 %     Max Ex. HR 98 bpm     Max Ex. BP 124/64     2 Minute Post BP 118/68

## 2022-04-12 ENCOUNTER — Other Ambulatory Visit
Admission: RE | Admit: 2022-04-12 | Discharge: 2022-04-12 | Disposition: A | Discharge status: Home / Self Care | Payer: Medicare Other | Source: Ambulatory Visit | Attending: Physician Assistant | Admitting: Physician Assistant

## 2022-04-12 ENCOUNTER — Telehealth: Payer: Self-pay | Admitting: *Deleted

## 2022-04-12 DIAGNOSIS — I4729 Other ventricular tachycardia: Secondary | ICD-10-CM | POA: Insufficient documentation

## 2022-04-12 DIAGNOSIS — I2119 ST elevation (STEMI) myocardial infarction involving other coronary artery of inferior wall: Secondary | ICD-10-CM | POA: Diagnosis present

## 2022-04-12 DIAGNOSIS — E785 Hyperlipidemia, unspecified: Secondary | ICD-10-CM | POA: Diagnosis present

## 2022-04-12 DIAGNOSIS — I2510 Atherosclerotic heart disease of native coronary artery without angina pectoris: Secondary | ICD-10-CM | POA: Insufficient documentation

## 2022-04-12 LAB — COMPREHENSIVE METABOLIC PANEL
ALT: 18 U/L (ref 0–44)
AST: 26 U/L (ref 15–41)
Albumin: 4 g/dL (ref 3.5–5.0)
Alkaline Phosphatase: 93 U/L (ref 38–126)
Anion gap: 8 (ref 5–15)
BUN: 15 mg/dL (ref 8–23)
CO2: 25 mmol/L (ref 22–32)
Calcium: 9.5 mg/dL (ref 8.9–10.3)
Chloride: 107 mmol/L (ref 98–111)
Creatinine, Ser: 0.98 mg/dL (ref 0.44–1.00)
GFR, Estimated: >60 mL/min (ref >60)
Glucose, Bld: 104 mg/dL — ABNORMAL HIGH (ref 70–99)
Potassium: 4.5 mmol/L (ref 3.5–5.1)
Sodium: 140 mmol/L (ref 135–145)
Total Bilirubin: 0.6 mg/dL (ref 0.3–1.2)
Total Protein: 7.2 g/dL (ref 6.5–8.1)

## 2022-04-12 LAB — LIPID PANEL
Cholesterol: 151 mg/dL (ref 0–200)
HDL: 45 mg/dL (ref >40)
LDL Cholesterol: 80 mg/dL (ref 0–99)
Total CHOL/HDL Ratio: 3.4 RATIO
Triglycerides: 128 mg/dL (ref <150)
VLDL: 26 mg/dL (ref 0–40)

## 2022-04-12 LAB — CBC
HCT: 36.4 % (ref 36.0–46.0)
Hemoglobin: 11.7 g/dL — ABNORMAL LOW (ref 12.0–15.0)
MCH: 29 pg (ref 26.0–34.0)
MCHC: 32.1 g/dL (ref 30.0–36.0)
MCV: 90.1 fL (ref 80.0–100.0)
Platelets: 237 10*3/uL (ref 150–400)
RBC: 4.04 MIL/uL (ref 3.87–5.11)
RDW: 14.3 % (ref 11.5–15.5)
WBC: 8.3 10*3/uL (ref 4.0–10.5)
nRBC: 0 % (ref 0.0–0.2)

## 2022-04-12 NOTE — Telephone Encounter (Signed)
See result note: No answer/Voicemail box is full

## 2022-04-12 NOTE — Telephone Encounter (Signed)
-----   Message from Theora Gianotti, NP sent at 04/12/2022 12:58 PM EST ----- Kidney function, liver function, electrolytes-normal.  Blood count stable.  Lipids improved with total cholesterol 151, triglycerides 128, and LDL of 80 however, still not at goal.  Recommend addition of Repatha 140 mg every 2 weeks.

## 2022-04-15 ENCOUNTER — Other Ambulatory Visit: Payer: Self-pay | Admitting: Nurse Practitioner

## 2022-04-15 MED ORDER — REPATHA SURECLICK 140 MG/ML ~~LOC~~ SOAJ: 140.0000 mg | SUBCUTANEOUS | 6 refills | Status: DC

## 2022-04-15 NOTE — Telephone Encounter (Signed)
Reviewed results and recommendations with patient. Discussed repatha and she was agreeable. She verbalized understanding with no further questions at this time.

## 2022-04-15 NOTE — Telephone Encounter (Signed)
Repatha isnt covered - pharmacy is requesting Praluent.

## 2022-04-15 NOTE — Telephone Encounter (Signed)
Repatha is not covered - Pharmacy is requesting Praluent. Is this switch ok with you?

## 2022-04-17 ENCOUNTER — Other Ambulatory Visit (HOSPITAL_COMMUNITY): Payer: Self-pay

## 2022-04-17 ENCOUNTER — Other Ambulatory Visit: Payer: Self-pay | Admitting: Physician Assistant

## 2022-04-17 ENCOUNTER — Telehealth: Payer: Self-pay

## 2022-04-17 ENCOUNTER — Encounter: Payer: Self-pay | Admitting: Hematology and Oncology

## 2022-04-17 ENCOUNTER — Encounter: Payer: Self-pay | Admitting: Oncology

## 2022-04-17 NOTE — Telephone Encounter (Signed)
Pharmacy Patient Advocate Encounter  Prior Authorization for Praluent '75mg'$ /ml has been Approved by [facility] [employer] (ins).    key# [id number]  Effective dates: 2.28.24 until further notice

## 2022-04-18 ENCOUNTER — Other Ambulatory Visit: Payer: Self-pay | Admitting: *Deleted

## 2022-04-18 NOTE — Progress Notes (Signed)
Cardiology Office Note    Date:  04/19/2022   ID:  [name], DOB 02-16-1960, MRN [id number]  PCP:  Macarthur Critchley, MD  Cardiologist:  Nelva Bush, MD  Electrophysiologist:  None   Chief Complaint: Follow-up  History of Present Illness:   Connie Cameron is a 62 y.o. female with history of CAD with recent inferior ST elevation MI on 01/23/2022, HLD, and head and neck cancer who presents for follow-up of CAD.   She was admitted to [facility] from 12/20-12/22/2023 with an inferior ST elevation MI.  LHC showed severe single-vessel CAD with 99% mid RCA stenosis felt to likely be due to acute plaque rupture.  There was also 50 to 60% proximal RCA stenosis as well as moderate to severe, but noncritical left coronary artery disease.  She underwent successful PCI/DES to the proximal and mid RCA using nonoverlapping drug-eluting stents.  There was a jailed RV marginal branch arising from the mid RCA that demonstrated 70% stenosis of pre-PCI that increased to 90% with plaque shift.  There was TIMI II flow at the end of the procedure.  The vessel was a small branch and too small for intervention.  The left coronary artery disease was favored for medical management as the most severe stenoses involved small/distal branches.  Echo during the admission demonstrated an EF of 60 to 65%, no regional wall motion abnormalities, mild LVH, normal LV diastolic function parameters, normal RV systolic function, mild mitral regurgitation, and aortic valve sclerosis without evidence of stenosis.  High-sensitivity troponin peaked at 3204.  Post intervention there was some NSVT that resolved prior to discharge.  She was seen in hospital follow-up in 02/2022 and was without symptoms of angina or cardiac decompensation.  She did note some mild positional dizziness with recommendation to decrease Toprol-XL to 12.5 mg daily.  Follow-up labs obtained earlier this month showed an improved LDL of 80 (prior 161 not on statin  therapy).  Prescription for PCSK9 inhibitor was sent in.  She comes in doing reasonably well from a cardiac perspective.  Over the past couple of weeks, she has noted some intermittent randomly occurring chest tightness and shortness of breath without frank chest pain.  Symptoms feel somewhat similar to what she was experiencing leading up to her MI, though not as severe.  She also notes some left-sided neck discomfort and headache.  After discussing with her sister, she is concerned about her carotid arteries.  No falls or symptoms concerning for bleeding.  Fatigue is a stable.  No lower extremity swelling, abdominal distention, orthopnea.  She is participating in cardiac rehab.  She has been adherent to DAPT and denies missing any doses.  Currently chest pain/tightness-free.   Labs independently reviewed: 04/2022 - TC 151, TG 128, HDL 45, LDL 80, potassium 4.5, BUN 15, serum creatinine 0.98, albumin 4.0, AST/ALT normal, Hgb 11.7, PLT 237 02/2022 - TSH normal 01/2022 - magnesium 2.5, A1c 6.2   Past Medical History:  Diagnosis Date   Anxiety    Depression    GERD (gastroesophageal reflux disease)    Throat cancer ([place]) 2021   Tongue cancer ([place])     Past Surgical History:  Procedure Laterality Date   COLONOSCOPY WITH PROPOFOL N/A 03/30/2020   Procedure: COLONOSCOPY WITH PROPOFOL;  Surgeon: Virgel Manifold, MD;  Location: [facility] ENDOSCOPY;  Service: Endoscopy;  Laterality: N/A;  COVID POSITIVE 02/29/2020   CORONARY/GRAFT ACUTE MI REVASCULARIZATION N/A 01/23/2022   Procedure: Coronary/Graft Acute MI Revascularization;  Surgeon: End,  Harrell Gave, MD;  Location: [place] [facility];  Service: Cardiovascular;  Laterality: N/A;   ESOPHAGOGASTRODUODENOSCOPY (EGD) WITH PROPOFOL N/A 03/30/2020   Procedure: ESOPHAGOGASTRODUODENOSCOPY (EGD) WITH PROPOFOL;  Surgeon: Virgel Manifold, MD;  Location: [facility] ENDOSCOPY;  Service: Endoscopy;  Laterality: N/A;   ESOPHAGOGASTRODUODENOSCOPY (EGD) WITH  PROPOFOL N/A 08/03/2020   Procedure: ESOPHAGOGASTRODUODENOSCOPY (EGD) WITH PROPOFOL;  Surgeon: Virgel Manifold, MD;  Location: [facility] ENDOSCOPY;  Service: Endoscopy;  Laterality: N/A;   ESOPHAGOGASTRODUODENOSCOPY (EGD) WITH PROPOFOL N/A 01/25/2021   Procedure: ESOPHAGOGASTRODUODENOSCOPY (EGD) WITH PROPOFOL;  Surgeon: Virgel Manifold, MD;  Location: [facility] ENDOSCOPY;  Service: Endoscopy;  Laterality: N/A;   ESOPHAGOGASTRODUODENOSCOPY (EGD) WITH PROPOFOL N/A 09/05/2021   Procedure: ESOPHAGOGASTRODUODENOSCOPY (EGD) WITH PROPOFOL;  Surgeon: Lin Landsman, MD;  Location: [facility] ENDOSCOPY;  Service: Gastroenterology;  Laterality: N/A;   EXCISION MASS NECK Left 01/18/2019   Procedure: EXCISION MASS NECK/NODE;  Surgeon: Margaretha Sheffield, MD;  Location: [facility] ORS;  Service: ENT;  Laterality: Left;   IR REMOVAL TUN ACCESS W/ PORT W/O FL MOD SED  12/05/2021   LARYNGOSCOPY Bilateral 02/22/2019   Procedure: MICROSCOPIC DIRECT LARYNGOSCOPY AND BIOPSY;  Surgeon: Margaretha Sheffield, MD;  Location: [facility] OR[facility];  Service: ENT;  Laterality: Bilateral;   LEFT HEART CATH AND CORONARY ANGIOGRAPHY N/A 01/23/2022   Procedure: LEFT HEART CATH AND CORONARY ANGIOGRAPHY;  Surgeon: Nelva Bush, MD;  Location: [place] [facility];  Service: Cardiovascular;  Laterality: N/A;   PORTA CATH INSERTION N/A 03/24/2019   Procedure: PORTA CATH INSERTION;  Surgeon: Katha Cabal, MD;  Location: [place] [facility];  Service: Cardiovascular;  Laterality: N/A;   TONSILLECTOMY     TUBAL LIGATION      Current Medications: Current Meds  Medication Sig   Alirocumab (PRALUENT) 75 MG/ML SOAJ Inject 75 mg into the skin every 14 (fourteen) days.   aspirin 81 MG chewable tablet Chew 1 tablet (81 mg total) by mouth daily.   atorvastatin (LIPITOR) 80 MG tablet TAKE 1 TABLET BY MOUTH EVERY DAY   baclofen (LIORESAL) 10 MG tablet Take 10 mg by mouth at bedtime as needed.   Docusate Sodium (DSS) 100 MG CAPS Take 1 capsule by mouth 2 (two)  times daily.   ezetimibe (ZETIA) 10 MG tablet TAKE 1 TABLET BY MOUTH EVERY DAY   gabapentin (NEURONTIN) 300 MG capsule Take 300 mg by mouth 2 (two) times daily.   hydrOXYzine (ATARAX/VISTARIL) 25 MG tablet Take 25 mg by mouth daily as needed for nausea, vomiting or anxiety.   isosorbide mononitrate (IMDUR) 30 MG 24 hr tablet Take 0.5 tablets (15 mg total) by mouth daily.   meclizine (ANTIVERT) 12.5 MG tablet Take 1 tablet (12.5 mg total) by mouth 3 (three) times daily as needed for dizziness or nausea.   metoprolol succinate (TOPROL-XL) 25 MG 24 hr tablet Take 0.5 tablets (12.5 mg total) by mouth daily. Take with or immediately following a meal.   pantoprazole (PROTONIX) 40 MG tablet Take 1 tablet (40 mg total) by mouth daily.   sertraline (ZOLOFT) 100 MG tablet Take 100 mg by mouth daily.   ticagrelor (BRILINTA) 90 MG TABS tablet Take 1 tablet (90 mg total) by mouth 2 (two) times daily.   Vitamin D, Ergocalciferol, (DRISDOL) 1.25 MG (50000 UNIT) CAPS capsule Take 50,000 Units by mouth once a week.    Allergies:   Chlorhexidine   Social History   Socioeconomic History   Marital status: Single    Spouse name: Not on file   Number of children:  Not on file   Years of education: Not on file   Highest education level: Not on file  Occupational History   Not on file  Tobacco Use   Smoking status: Never   Smokeless tobacco: Never  Vaping Use   Vaping Use: Never used  Substance and Sexual Activity   Alcohol use: Not Currently   Drug use: No   Sexual activity: Not on file  Other Topics Concern   Not on file  Social History Narrative   Not on file   Social Determinants of Health   Financial Resource Strain: Not on file  Food Insecurity: Not on file  Transportation Needs: Not on file  Physical Activity: Not on file  Stress: Not on file  Social Connections: Not on file     Family History:  The patient's family history includes Brain cancer in her paternal grandfather; Breast  cancer in her sister.  ROS:   12-point review of systems is negative unless otherwise noted in the HPI.   EKGs/Labs/Other Studies Reviewed:    Studies reviewed were summarized above. The additional studies were reviewed today:  2D echo 01/23/2022: 1. Left ventricular ejection fraction, by estimation, is 60 to 65%. The  left ventricle has normal function. The left ventricle has no regional  wall motion abnormalities. There is mild left ventricular hypertrophy.  Left ventricular diastolic parameters  were normal.   2. Right ventricular systolic function is normal. The right ventricular  size is not well visualized.   3. The mitral valve is normal in structure. Mild mitral valve  regurgitation.   4. The aortic valve is tricuspid. Aortic valve regurgitation is not  visualized. Aortic valve sclerosis is present, with no evidence of aortic  valve stenosis.  __________   LHC 01/23/2022: Conclusions: Severe single-vessel coronary artery disease with 99% mid RCA stenosis, likely due to acute plaque rupture, with TIMI-2 flow.  There is also a hazy 50-60% proximal RCA stenosis as well as moderate to severe but not critical left coronary artery disease as detailed below. Normal left ventricular filling pressure (LVEDP 12 mmHg). Successful PCI to proximal and mid RCA stenoses using nonoverlapping Onyx Frontier 3.0 x 12 mm (proximal) and 2.75 x 18 mm (mid) drug-eluting stents with 0% residual stenosis and TIMI-3 flow.  Jailed RV marginal branch arising from the mid RCA demonstrates 70% stenosis pre-PCI, increased to 90% plaque shift.  There is TIMI-2 flow at the end of the procedure through this small branch, which is too small for intervention.   Recommendations: Admit to ICU for post STEMI/PCI monitoring. Dual antiplatelet therapy with aspirin and ticagrelor for at least 12 months. Aggressive secondary prevention of coronary artery disease. Obtain echocardiogram. Favor medical therapy of the  left coronary artery disease, as the most severe stenoses involve small/distal branches.   EKG:  EKG is ordered today.  The EKG ordered today demonstrates heart, 75 bpm, no acute ST-T changes  Recent Labs: 01/25/2022: Magnesium 2.5 02/21/2022: TSH 3.206 04/12/2022: ALT 18; BUN 15; Creatinine, Ser 0.98; Hemoglobin 11.7; Platelets 237; Potassium 4.5; Sodium 140  Recent Lipid Panel    Component Value Date/Time   CHOL 151 04/12/2022 1020   TRIG 128 04/12/2022 1020   HDL 45 04/12/2022 1020   CHOLHDL 3.4 04/12/2022 1020   VLDL 26 04/12/2022 1020   LDLCALC 80 04/12/2022 1020    PHYSICAL EXAM:    VS:  BP 100/70 (BP Location: Left Arm, Patient Position: Sitting, Cuff Size: Normal)   Pulse 75  Ht 5\' 2"  (1.575 m)   Wt 160 lb 8 oz (72.8 kg)   SpO2 98%   BMI 29.36 kg/m   BMI: Body mass index is 29.36 kg/m.  Physical Exam Vitals reviewed.  Constitutional:      Appearance: She is well-developed.  HENT:     Head: Normocephalic and atraumatic.  Eyes:     General:        Right eye: No discharge.        Left eye: No discharge.  Neck:     Vascular: No JVD.  Cardiovascular:     Rate and Rhythm: Normal rate and regular rhythm.     Pulses:          Carotid pulses are  on the left side with bruit.    Heart sounds: Normal heart sounds, S1 normal and S2 normal. Heart sounds not distant. No midsystolic click and no opening snap. No murmur heard.    No friction rub.  Pulmonary:     Effort: Pulmonary effort is normal. No respiratory distress.     Breath sounds: Normal breath sounds. No decreased breath sounds, wheezing or rales.  Chest:     Chest wall: No tenderness.  Abdominal:     General: There is no distension.  Musculoskeletal:     Cervical back: Normal range of motion.     Right lower leg: No edema.     Left lower leg: No edema.  Skin:    General: Skin is warm and dry.     Nails: There is no clubbing.  Neurological:     Mental Status: She is alert and oriented to person, place,  and time.  Psychiatric:        Speech: Speech normal.        Behavior: Behavior normal.        Thought Content: Thought content normal.        Judgment: Judgment normal.     Wt Readings from Last 3 Encounters:  04/19/22 160 lb 8 oz (72.8 kg)  02/15/22 163 lb 12.8 oz (74.3 kg)  02/07/22 164 lb 1.6 oz (74.4 kg)     ASSESSMENT & PLAN:   CAD in the native coronary arteries with recent inferior ST elevation MI with other forms of angina: Currently without chest pain or chest tightness.  Given development of randomly occurring chest tightness, we will initiate her on Imdur 15 mg nightly.  She does have residual CAD involving the left coronary tree which is largely located along small vessels/distal sites favored to be medically managed.  Obtain a limited echo to evaluate for any new structural abnormalities.  Continue aggressive risk factor modification and secondary prevention including DAPT with aspirin and ticagrelor without interruption for a minimum of 12 months dating back to date of PCI (01/23/2022).  She will otherwise continue atorvastatin, ezetimibe, and Toprol-XL.  Cardiac rehab.  If despite escalation of antianginal therapy, she continues to have intermittent chest tightness we may need to pursue relook cardiac catheterization.  ED precautions.  NSVT: Likely in the setting of acute MI. No further ventricular ectopy noted.  She remains on Toprol-XL.  HLD: LDL 80 with a triglyceride of 128 earlier this month with goal LDL being less than 55.  She remains on atorvastatin 80 mg and ezetimibe.  Praluent has been prescribed and we are awaiting prior authorization.  Left-sided bruit/headache: Obtain carotid artery ultrasound.  If this is unrevealing recommend ongoing management per PCP.  With regards to her headache, no  red flag symptoms.    Disposition: F/u with Dr. Saunders Revel or an APP in 3 months.   Medication Adjustments/Labs and Tests Ordered: Current medicines are reviewed at length with  the patient today.  Concerns regarding medicines are outlined above. Medication changes, Labs and Tests ordered today are summarized above and listed in the Patient Instructions accessible in Encounters.   Signed, Christell Faith, PA-C 04/19/2022 10:39 AM     [facility] [address] [place], [place] [address] [phone]

## 2022-04-19 ENCOUNTER — Ambulatory Visit: Payer: Medicare Other | Attending: Physician Assistant | Admitting: Physician Assistant

## 2022-04-19 ENCOUNTER — Encounter: Payer: Self-pay | Admitting: Physician Assistant

## 2022-04-19 VITALS — BP 100/70 | HR 75 | Ht 62.0 in | Wt 160.5 lb

## 2022-04-19 DIAGNOSIS — E785 Hyperlipidemia, unspecified: Secondary | ICD-10-CM | POA: Diagnosis not present

## 2022-04-19 DIAGNOSIS — R0989 Other specified symptoms and signs involving the circulatory and respiratory systems: Secondary | ICD-10-CM | POA: Diagnosis present

## 2022-04-19 DIAGNOSIS — I25118 Atherosclerotic heart disease of native coronary artery with other forms of angina pectoris: Secondary | ICD-10-CM

## 2022-04-19 DIAGNOSIS — I4729 Other ventricular tachycardia: Secondary | ICD-10-CM | POA: Diagnosis not present

## 2022-04-19 DIAGNOSIS — I951 Orthostatic hypotension: Secondary | ICD-10-CM

## 2022-04-19 DIAGNOSIS — R519 Headache, unspecified: Secondary | ICD-10-CM

## 2022-04-19 DIAGNOSIS — I2119 ST elevation (STEMI) myocardial infarction involving other coronary artery of inferior wall: Secondary | ICD-10-CM

## 2022-04-19 DIAGNOSIS — I2510 Atherosclerotic heart disease of native coronary artery without angina pectoris: Secondary | ICD-10-CM

## 2022-04-19 MED ORDER — ISOSORBIDE MONONITRATE ER 30 MG PO TB24: 15.0000 mg | ORAL_TABLET | Freq: Every day | ORAL | 0 refills | Status: DC

## 2022-04-19 NOTE — Patient Instructions (Addendum)
.  Medication Instructions:  Your physician has recommended you make the following change in your medication:  START Welcome NIGHTLY   *If you need a refill on your cardiac medications before your next appointment, please call your pharmacy*   Lab Work: NONE If you have labs (blood work) drawn today and your tests are completely normal, you will receive your results only by: [facility] (if you have MyChart) OR A paper copy in the mail If you have any lab test that is abnormal or we need to change your treatment, we will call you to review the results.   Testing/Procedures: Your physician has requested that you have an echocardiogram. Echocardiography is a painless test that uses sound waves to create images of your heart. It provides your doctor with information about the size and shape of your heart and how well your heart's chambers and valves are working. This procedure takes approximately one hour. There are no restrictions for this procedure. Please do NOT wear cologne, perfume, aftershave, or lotions (deodorant is allowed). Please arrive 15 minutes prior to your appointment time.   Your physician has requested that you have a carotid duplex. This test is an ultrasound of the carotid arteries in your neck. It looks at blood flow through these arteries that supply the brain with blood. Allow one hour for this exam. There are no restrictions or special instructions.    Follow-Up: At [facility], you and your health needs are our priority.  As part of our continuing mission to provide you with exceptional heart care, we have created designated Provider Care Teams.  These Care Teams include your primary Cardiologist (physician) and Advanced Practice Providers (APPs -  Physician Assistants and Nurse Practitioners) who all work together to provide you with the care you need, when you need it.  We recommend signing up for the patient portal called  "MyChart".  Sign up information is provided on this After Visit Summary.  MyChart is used to connect with patients for Virtual Visits (Telemedicine).  Patients are able to view lab/test results, encounter notes, upcoming appointments, etc.  Non-urgent messages can be sent to your provider as well.   To learn more about what you can do with MyChart, go to [email or web address].    Your next appointment:   3 month(s)  Provider:   You may see Nelva Bush, MD or one of the following Advanced Practice Providers on your designated Care Team:   Murray Hodgkins, NP Christell Faith, PA-C Cadence Kathlen Mody, PA-C Gerrie Nordmann, NP

## 2022-04-22 ENCOUNTER — Inpatient Hospital Stay: Payer: Medicare Other | Attending: Internal Medicine

## 2022-04-22 DIAGNOSIS — D701 Agranulocytosis secondary to cancer chemotherapy: Secondary | ICD-10-CM

## 2022-04-22 DIAGNOSIS — D519 Vitamin B12 deficiency anemia, unspecified: Secondary | ICD-10-CM | POA: Diagnosis present

## 2022-04-22 MED ORDER — CYANOCOBALAMIN 1000 MCG/ML IJ SOLN
1000.0000 ug | Freq: Once | INTRAMUSCULAR | Status: AC
  Administered 2022-04-22: 1000 ug via INTRAMUSCULAR
  Filled 2022-04-22: qty 1

## 2022-05-07 ENCOUNTER — Other Ambulatory Visit: Payer: Self-pay

## 2022-05-18 ENCOUNTER — Other Ambulatory Visit: Payer: Self-pay | Admitting: Physician Assistant

## 2022-05-21 ENCOUNTER — Telehealth: Payer: Self-pay | Admitting: Physician Assistant

## 2022-05-21 MED ORDER — TICAGRELOR 90 MG PO TABS: 90.0000 mg | ORAL_TABLET | Freq: Two times a day (BID) | ORAL | 0 refills | Status: DC

## 2022-05-21 NOTE — Telephone Encounter (Signed)
*  STAT* If patient is at the pharmacy, call can be transferred to refill team.   1. Which medications need to be refilled? (please list name of each medication and dose if known) ticagrelor (BRILINTA) 90 MG TABS tablet   2. Which pharmacy/location (including street and city if local pharmacy) is medication to be sent to? [id number] [place], [place] - [address]    3. Do they need a 30 day or 90 day supply? 90

## 2022-05-21 NOTE — Telephone Encounter (Signed)
Requested Prescriptions   Signed Prescriptions Disp Refills   ticagrelor (BRILINTA) 90 MG TABS tablet 180 tablet 0    Sig: Take 1 tablet (90 mg total) by mouth 2 (two) times daily.    Authorizing Provider: Sondra Barges    Ordering User: Thayer Headings, Delsy Etzkorn L

## 2022-05-22 ENCOUNTER — Other Ambulatory Visit: Payer: Self-pay | Admitting: *Deleted

## 2022-05-22 DIAGNOSIS — Z79899 Other long term (current) drug therapy: Secondary | ICD-10-CM

## 2022-05-22 DIAGNOSIS — C01 Malignant neoplasm of base of tongue: Secondary | ICD-10-CM

## 2022-05-23 ENCOUNTER — Inpatient Hospital Stay: Payer: Medicare Other | Admitting: Internal Medicine

## 2022-05-23 ENCOUNTER — Inpatient Hospital Stay: Payer: Medicare Other | Attending: Internal Medicine

## 2022-05-23 ENCOUNTER — Encounter: Payer: Self-pay | Admitting: Internal Medicine

## 2022-05-23 ENCOUNTER — Inpatient Hospital Stay: Payer: Medicare Other

## 2022-05-23 VITALS — BP 106/82 | HR 78 | Temp 98.0°F | Ht 62.0 in | Wt 162.0 lb

## 2022-05-23 DIAGNOSIS — D519 Vitamin B12 deficiency anemia, unspecified: Secondary | ICD-10-CM | POA: Diagnosis not present

## 2022-05-23 DIAGNOSIS — Z7982 Long term (current) use of aspirin: Secondary | ICD-10-CM | POA: Diagnosis not present

## 2022-05-23 DIAGNOSIS — C01 Malignant neoplasm of base of tongue: Secondary | ICD-10-CM | POA: Diagnosis not present

## 2022-05-23 DIAGNOSIS — D649 Anemia, unspecified: Secondary | ICD-10-CM | POA: Insufficient documentation

## 2022-05-23 DIAGNOSIS — E039 Hypothyroidism, unspecified: Secondary | ICD-10-CM | POA: Insufficient documentation

## 2022-05-23 DIAGNOSIS — Z923 Personal history of irradiation: Secondary | ICD-10-CM | POA: Insufficient documentation

## 2022-05-23 DIAGNOSIS — Z9221 Personal history of antineoplastic chemotherapy: Secondary | ICD-10-CM | POA: Insufficient documentation

## 2022-05-23 DIAGNOSIS — R5383 Other fatigue: Secondary | ICD-10-CM | POA: Diagnosis not present

## 2022-05-23 DIAGNOSIS — K219 Gastro-esophageal reflux disease without esophagitis: Secondary | ICD-10-CM | POA: Insufficient documentation

## 2022-05-23 DIAGNOSIS — Z79899 Other long term (current) drug therapy: Secondary | ICD-10-CM | POA: Diagnosis not present

## 2022-05-23 DIAGNOSIS — Z7902 Long term (current) use of antithrombotics/antiplatelets: Secondary | ICD-10-CM | POA: Diagnosis not present

## 2022-05-23 DIAGNOSIS — D701 Agranulocytosis secondary to cancer chemotherapy: Secondary | ICD-10-CM

## 2022-05-23 DIAGNOSIS — Z85810 Personal history of malignant neoplasm of tongue: Secondary | ICD-10-CM | POA: Insufficient documentation

## 2022-05-23 LAB — CMP (CANCER CENTER ONLY)
ALT: 18 U/L (ref 0–44)
AST: 27 U/L (ref 15–41)
Albumin: 4.4 g/dL (ref 3.5–5.0)
Alkaline Phosphatase: 95 U/L (ref 38–126)
Anion gap: 6 (ref 5–15)
BUN: 19 mg/dL (ref 8–23)
CO2: 25 mmol/L (ref 22–32)
Calcium: 9.6 mg/dL (ref 8.9–10.3)
Chloride: 108 mmol/L (ref 98–111)
Creatinine: 1.07 mg/dL — ABNORMAL HIGH (ref 0.44–1.00)
GFR, Estimated: 59 mL/min — ABNORMAL LOW (ref >60)
Glucose, Bld: 126 mg/dL — ABNORMAL HIGH (ref 70–99)
Potassium: 4.3 mmol/L (ref 3.5–5.1)
Sodium: 139 mmol/L (ref 135–145)
Total Bilirubin: 0.8 mg/dL (ref 0.3–1.2)
Total Protein: 7.7 g/dL (ref 6.5–8.1)

## 2022-05-23 LAB — CBC WITH DIFFERENTIAL (CANCER CENTER ONLY)
Abs Immature Granulocytes: 0.02 10*3/uL (ref 0.00–0.07)
Basophils Absolute: 0 10*3/uL (ref 0.0–0.1)
Basophils Relative: 1 %
Eosinophils Absolute: 0.1 10*3/uL (ref 0.0–0.5)
Eosinophils Relative: 2 %
HCT: 38 % (ref 36.0–46.0)
Hemoglobin: 12.4 g/dL (ref 12.0–15.0)
Immature Granulocytes: 0 %
Lymphocytes Relative: 16 %
Lymphs Abs: 1 10*3/uL (ref 0.7–4.0)
MCH: 29.2 pg (ref 26.0–34.0)
MCHC: 32.6 g/dL (ref 30.0–36.0)
MCV: 89.6 fL (ref 80.0–100.0)
Monocytes Absolute: 0.4 10*3/uL (ref 0.1–1.0)
Monocytes Relative: 7 %
Neutro Abs: 4.7 10*3/uL (ref 1.7–7.7)
Neutrophils Relative %: 74 %
Platelet Count: 230 10*3/uL (ref 150–400)
RBC: 4.24 MIL/uL (ref 3.87–5.11)
RDW: 14.2 % (ref 11.5–15.5)
WBC Count: 6.3 10*3/uL (ref 4.0–10.5)
nRBC: 0 % (ref 0.0–0.2)

## 2022-05-23 LAB — TSH: TSH: 5.455 u[IU]/mL — ABNORMAL HIGH (ref 0.350–4.500)

## 2022-05-23 MED ORDER — CYANOCOBALAMIN 1000 MCG/ML IJ SOLN
1000.0000 ug | Freq: Once | INTRAMUSCULAR | Status: AC
  Administered 2022-05-23: 1000 ug via INTRAMUSCULAR

## 2022-05-23 NOTE — Progress Notes (Signed)
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  HISTORY OF PRESENTING ILLNESS: Patient ambulating-independently. Alone.  Connie Cameron 62 y.o.  female history of base of tongue squamous cell carcinoma; and also B12 deficiency is here for follow-up.  Patient reports increased fatigue.  Denies any difficulty in swallowing.  Denies any new lumps or bumps.  Patient recently evaluated by ENT.  Patient denies any nausea vomiting.  Denies any fevers or chills.   Review of Systems  Constitutional:  Positive for malaise/fatigue. Negative for chills, diaphoresis, fever and weight loss.  HENT:  Negative for nosebleeds and sore throat.   Eyes:  Negative for double vision.  Respiratory:  Negative for cough, hemoptysis, sputum production, shortness of breath and wheezing.   Cardiovascular:  Negative for chest pain, palpitations, orthopnea and leg swelling.  Gastrointestinal:  Negative for abdominal pain, blood in stool, constipation, diarrhea, heartburn, melena, nausea and vomiting.  Genitourinary:  Negative for dysuria, frequency and urgency.  Musculoskeletal:  Positive for back pain and joint pain.  Skin: Negative.  Negative for itching and rash.  Neurological:  Negative for dizziness, tingling, focal weakness, weakness and headaches.  Endo/Heme/Allergies:  Does not bruise/bleed easily.  Psychiatric/Behavioral:  Negative for depression. The patient is not nervous/anxious and does not have insomnia.      MEDICAL HISTORY:  Past Medical History:  Diagnosis Date    Anxiety    Depression    GERD (gastroesophageal reflux disease)    Throat cancer 2021   Tongue cancer     SURGICAL HISTORY: Past Surgical History:  Procedure Laterality Date   COLONOSCOPY WITH PROPOFOL N/A 03/30/2020   Procedure: COLONOSCOPY WITH PROPOFOL;  Surgeon: Pasty Spillers, MD;  Location: [facility] ENDOSCOPY;  Service: Endoscopy;  Laterality: N/A;  COVID POSITIVE 02/29/2020   CORONARY/GRAFT ACUTE MI REVASCULARIZATION N/A 01/23/2022   Procedure: Coronary/Graft Acute MI Revascularization;  Surgeon: Yvonne Kendall, MD;  Location: [facility];  Service: Cardiovascular;  Laterality: N/A;   ESOPHAGOGASTRODUODENOSCOPY (EGD) WITH PROPOFOL N/A 03/30/2020   Procedure: ESOPHAGOGASTRODUODENOSCOPY (EGD) WITH PROPOFOL;  Surgeon: Pasty Spillers, MD;  Location: [facility] ENDOSCOPY;  Service: Endoscopy;  Laterality: N/A;   ESOPHAGOGASTRODUODENOSCOPY (EGD) WITH PROPOFOL N/A 08/03/2020   Procedure: ESOPHAGOGASTRODUODENOSCOPY (EGD) WITH PROPOFOL;  Surgeon: Pasty Spillers, MD;  Location: [facility] ENDOSCOPY;  Service: Endoscopy;  Laterality: N/A;   ESOPHAGOGASTRODUODENOSCOPY (EGD) WITH PROPOFOL N/A 01/25/2021   Procedure: ESOPHAGOGASTRODUODENOSCOPY (EGD) WITH PROPOFOL;  Surgeon: Pasty Spillers, MD;  Location: [facility] ENDOSCOPY;  Service: Endoscopy;  Laterality: N/A;   ESOPHAGOGASTRODUODENOSCOPY (EGD) WITH PROPOFOL N/A 09/05/2021   Procedure: ESOPHAGOGASTRODUODENOSCOPY (EGD) WITH PROPOFOL;  Surgeon: Toney Reil, MD;  Location: [facility] ENDOSCOPY;  Service: Gastroenterology;  Laterality: N/A;   EXCISION MASS NECK Left 01/18/2019   Procedure: EXCISION MASS NECK/NODE;  Surgeon: Vernie Murders, MD;  Location: [facility] ORS;  Service: ENT;  Laterality: Left;   IR REMOVAL TUN ACCESS W/ PORT W/O FL MOD SED  12/05/2021   LARYNGOSCOPY Bilateral 02/22/2019   Procedure: MICROSCOPIC DIRECT LARYNGOSCOPY AND BIOPSY;  Surgeon: Vernie Murders, MD;  Location: [facility] OR[facility];  Service: ENT;  Laterality: Bilateral;   LEFT  HEART CATH AND CORONARY  ANGIOGRAPHY N/A 01/23/2022   Procedure: LEFT HEART CATH AND CORONARY ANGIOGRAPHY;  Surgeon: Yvonne Kendall, MD;  Location: [facility];  Service: Cardiovascular;  Laterality: N/A;   PORTA CATH INSERTION N/A 03/24/2019   Procedure: PORTA CATH INSERTION;  Surgeon: Renford Dills, MD;  Location: [facility];  Service: Cardiovascular;  Laterality: N/A;   TONSILLECTOMY     TUBAL LIGATION      SOCIAL HISTORY: Social History   Socioeconomic History   Marital status: Single    Spouse name: Not on file   Number of children: Not on file   Years of education: Not on file   Highest education level: Not on file  Occupational History   Not on file  Tobacco Use   Smoking status: Never   Smokeless tobacco: Never  Vaping Use   Vaping Use: Never used  Substance and Sexual Activity   Alcohol use: Not Currently   Drug use: No   Sexual activity: Not on file  Other Topics Concern   Not on file  Social History Narrative   Not on file   Social Determinants of Health   Financial Resource Strain: Not on file  Food Insecurity: Not on file  Transportation Needs: Not on file  Physical Activity: Not on file  Stress: Not on file  Social Connections: Not on file  Intimate Partner Violence: Not on file    FAMILY HISTORY: Family History  Problem Relation Age of Onset   Breast cancer Sister    Brain cancer Paternal Grandfather     ALLERGIES:  is allergic to chlorhexidine.  MEDICATIONS:  Current Outpatient Medications  Medication Sig Dispense Refill   Alirocumab (PRALUENT) 75 MG/ML SOAJ Inject 75 mg into the skin every 14 (fourteen) days. 1 mL 3   aspirin 81 MG chewable tablet Chew 1 tablet (81 mg total) by mouth daily. 30 tablet 1   atorvastatin (LIPITOR) 80 MG tablet TAKE 1 TABLET BY MOUTH EVERY DAY 90 tablet 0   atorvastatin (LIPITOR) 80 MG tablet Take 1 tablet by mouth daily.     baclofen (LIORESAL) 10 MG tablet Take 10 mg by mouth at bedtime  as needed.     Docusate Sodium (DSS) 100 MG CAPS Take 1 capsule by mouth 2 (two) times daily.     ezetimibe (ZETIA) 10 MG tablet TAKE 1 TABLET BY MOUTH EVERY DAY 90 tablet 0   gabapentin (NEURONTIN) 300 MG capsule Take 300 mg by mouth 2 (two) times daily.     hydrOXYzine (ATARAX/VISTARIL) 25 MG tablet Take 25 mg by mouth daily as needed for nausea, vomiting or anxiety.     isosorbide mononitrate (IMDUR) 30 MG 24 hr tablet TAKE 1/2 OF A TABLET (15 MG TOTAL) BY MOUTH DAILY 45 tablet 0   meclizine (ANTIVERT) 12.5 MG tablet Take 1 tablet (12.5 mg total) by mouth 3 (three) times daily as needed for dizziness or nausea. 30 tablet 0   metoprolol succinate (TOPROL-XL) 25 MG 24 hr tablet Take 0.5 tablets (12.5 mg total) by mouth daily. Take with or immediately following a meal. 45 tablet 3   pantoprazole (PROTONIX) 40 MG tablet Take 1 tablet (40 mg total) by mouth daily. 30 tablet 1   pantoprazole (PROTONIX) 40 MG tablet Take 1 tablet by mouth daily.     sertraline (ZOLOFT) 100 MG tablet Take 100 mg by mouth daily.     ticagrelor (BRILINTA) 90 MG TABS tablet Take 1 tablet (90 mg total) by mouth 2 (two)  times daily. 180 tablet 0   ticagrelor (BRILINTA) 90 MG TABS tablet Take 1 tablet by mouth 2 (two) times daily.     Vitamin D, Ergocalciferol, (DRISDOL) 1.25 MG (50000 UNIT) CAPS capsule Take 50,000 Units by mouth once a week.     No current facility-administered medications for this visit.      Marland Kitchen  PHYSICAL EXAMINATION:  Vitals:   05/23/22 1058  BP: 106/82  Pulse: 78  Temp: 98 F (36.7 C)  SpO2: 98%   Filed Weights   05/23/22 1058  Weight: 162 lb (73.5 kg)    Physical Exam Vitals and nursing note reviewed.  HENT:     Head: Normocephalic and atraumatic.     Mouth/Throat:     Pharynx: Oropharynx is clear.  Eyes:     Extraocular Movements: Extraocular movements intact.     Pupils: Pupils are equal, round, and reactive to light.  Cardiovascular:     Rate and Rhythm: Normal rate and  regular rhythm.  Pulmonary:     Comments: Decreased breath sounds bilaterally.  Abdominal:     Palpations: Abdomen is soft.  Musculoskeletal:        General: Normal range of motion.     Cervical back: Normal range of motion.  Skin:    General: Skin is warm.  Neurological:     General: No focal deficit present.     Mental Status: She is alert and oriented to person, place, and time.  Psychiatric:        Behavior: Behavior normal.        Judgment: Judgment normal.      LABORATORY DATA:  I have reviewed the data as listed Lab Results  Component Value Date   WBC 6.3 05/23/2022   HGB 12.4 05/23/2022   HCT 38.0 05/23/2022   MCV 89.6 05/23/2022   PLT 230 05/23/2022   Recent Labs    02/21/22 1121 04/12/22 1020 05/23/22 1043  NA 139 140 139  K 4.2 4.5 4.3  CL 106 107 108  CO2 GLUCOSE 106* 104* 126*  BUN CREATININE 1.13* 0.98 1.07*  CALCIUM 9.2 9.5 9.6  GFRNONAA 55* >60 59*  PROT 7.4 7.2 7.7  ALBUMIN 3.9 4.0 4.4  AST ALT ALKPHOS 84 93 95  BILITOT 0.6 0.6 0.8    RADIOGRAPHIC STUDIES: I have personally reviewed the radiological images as listed and agreed with the findings in the report. No results found.  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 Genevive Bi q 44M.  Clinically no evidence of recurrence.  Stable.  Continue clinical surveillance without imaging.  # Mild hypothyroidism: TSH 5.4- per PCP ? Causing fatigue-defer to PCP for further management.  Recheck thyroid profile next visit.  #Normocytic anemia-with hemoglobin normal at 12.3-  [ EGD colonoscopy-February 2022 [Dr.Tahiliani]- Stable.   # Elevated BG- PBF- BG 126- ? Prediabetes- recommend follow up with PCP.- Stable.  #  history of B12 deficiency-continue B12 injections monthly x6- B12 pending today.  Continue B12 injection  today.  # Port/IV access- s/p port explantation- PIV   # DISPOSITION:  # B12 injection today;  # B12 injections monthly x6  # follow up in 6 months- MD: labs- cbc/cmp/tyroid profile; B12 levels; B12 injection; - Dr.B  All  questions were answered. The patient knows to call the clinic with any problems, questions or concerns.    Earna Coder, MD 05/23/2022 11:57 AM

## 2022-05-23 NOTE — Assessment & Plan Note (Addendum)
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 Genevive Bi q 62M.  Clinically no evidence of recurrence.  Stable.  Continue clinical surveillance without imaging.  # Mild hypothyroidism: TSH 5.4- per PCP ? Causing fatigue-defer to PCP for further management.  Recheck thyroid profile next visit.  #Normocytic anemia-with hemoglobin normal at 12.3-  [ EGD colonoscopy-February 2022 [Dr.Tahiliani]- Stable.   # Elevated BG- PBF- BG 126- ? Prediabetes- recommend follow up with PCP.- Stable.  #  history of B12 deficiency-continue B12 injections monthly x6- B12 pending today.  Continue B12 injection today.  # Port/IV access- s/p port explantation- PIV   # DISPOSITION:  # B12 injection today;  # B12 injections monthly x6  # follow up in 6 months- MD: labs- cbc/cmp/tyroid profile; B12 levels; B12 injection; - Dr.B

## 2022-05-23 NOTE — Progress Notes (Signed)
No concerns for the provider. 

## 2022-05-24 ENCOUNTER — Other Ambulatory Visit: Payer: Self-pay

## 2022-05-24 LAB — VITAMIN B12: Vitamin B-12: 256 pg/mL (ref 180–914)

## 2022-05-29 ENCOUNTER — Ambulatory Visit: Payer: Medicare Other | Attending: Physician Assistant

## 2022-05-29 ENCOUNTER — Ambulatory Visit (INDEPENDENT_AMBULATORY_CARE_PROVIDER_SITE_OTHER): Payer: Medicare Other

## 2022-05-29 DIAGNOSIS — I2119 ST elevation (STEMI) myocardial infarction involving other coronary artery of inferior wall: Secondary | ICD-10-CM | POA: Diagnosis not present

## 2022-05-29 DIAGNOSIS — R0989 Other specified symptoms and signs involving the circulatory and respiratory systems: Secondary | ICD-10-CM | POA: Diagnosis present

## 2022-05-29 DIAGNOSIS — I25118 Atherosclerotic heart disease of native coronary artery with other forms of angina pectoris: Secondary | ICD-10-CM | POA: Diagnosis not present

## 2022-05-29 LAB — ECHOCARDIOGRAM LIMITED
AR max vel: 1.94 cm2
AV Area VTI: 1.77 cm2
AV Area mean vel: 1.84 cm2
AV Mean grad: 3.5 mmHg
AV Peak grad: 6.5 mmHg
Ao pk vel: 1.27 m/s
Area-P 1/2: 2.73 cm2
S' Lateral: 2.5 cm

## 2022-06-03 ENCOUNTER — Other Ambulatory Visit: Payer: Self-pay

## 2022-06-06 ENCOUNTER — Other Ambulatory Visit: Payer: Self-pay

## 2022-06-12 ENCOUNTER — Other Ambulatory Visit: Payer: Self-pay | Admitting: Nurse Practitioner

## 2022-06-12 DIAGNOSIS — E785 Hyperlipidemia, unspecified: Secondary | ICD-10-CM

## 2022-06-12 DIAGNOSIS — I2111 ST elevation (STEMI) myocardial infarction involving right coronary artery: Secondary | ICD-10-CM

## 2022-06-14 ENCOUNTER — Other Ambulatory Visit: Payer: Self-pay

## 2022-06-19 ENCOUNTER — Other Ambulatory Visit: Payer: Self-pay | Admitting: Hematology and Oncology

## 2022-06-21 ENCOUNTER — Inpatient Hospital Stay: Payer: Medicare Other | Attending: Internal Medicine

## 2022-06-21 DIAGNOSIS — E538 Deficiency of other specified B group vitamins: Secondary | ICD-10-CM | POA: Diagnosis not present

## 2022-06-21 DIAGNOSIS — D519 Vitamin B12 deficiency anemia, unspecified: Secondary | ICD-10-CM | POA: Diagnosis present

## 2022-06-21 DIAGNOSIS — Z79899 Other long term (current) drug therapy: Secondary | ICD-10-CM | POA: Insufficient documentation

## 2022-06-21 DIAGNOSIS — D701 Agranulocytosis secondary to cancer chemotherapy: Secondary | ICD-10-CM

## 2022-06-21 MED ORDER — CYANOCOBALAMIN 1000 MCG/ML IJ SOLN
1000.0000 ug | Freq: Once | INTRAMUSCULAR | Status: AC
  Administered 2022-06-21: 1000 ug via INTRAMUSCULAR
  Filled 2022-06-21: qty 1

## 2022-06-28 ENCOUNTER — Encounter: Payer: Self-pay | Admitting: Oncology

## 2022-06-28 NOTE — Telephone Encounter (Signed)
Signing note, See previous encounter on 02/08/20 

## 2022-07-18 NOTE — Progress Notes (Signed)
Cardiology Office Note    Date:  07/19/2022   ID:  Connie Cameron, DOB 1960/09/28, MRN [id number]  PCP:  Connie Medina, MD  Cardiologist:  Connie Kendall, MD  Electrophysiologist:  None   Chief Complaint: Follow up  History of Present Illness:   Connie Cameron is a 62 y.o. female with history of CAD with inferior ST elevation MI on 01/23/2022, HLD, and head and neck cancer who presents for follow-up of CAD.   She was admitted to [facility] in 01/2022 with an inferior ST elevation MI.  LHC showed severe single-vessel CAD with 99% mid RCA stenosis felt to likely be due to acute plaque rupture.  There was also 50 to 60% proximal RCA stenosis as well as moderate to severe, but noncritical left coronary artery disease.  She underwent successful PCI/DES to the proximal and mid RCA using nonoverlapping drug-eluting stents.  There was a jailed RV marginal branch arising from the mid RCA that demonstrated 70% stenosis of pre-PCI that increased to 90% with plaque shift.  There was TIMI II flow at the end of the procedure.  The vessel was a small branch and too small for intervention.  The left coronary artery disease was favored for medical management as the most severe stenoses involved small/distal branches.  Echo during the admission demonstrated an EF of 60 to 65%, no regional wall motion abnormalities, mild LVH, normal LV diastolic function parameters, normal RV systolic function, mild mitral regurgitation, and aortic valve sclerosis without evidence of stenosis.  High-sensitivity troponin peaked at 3204.  Post intervention there was some NSVT that resolved prior to discharge.  She was seen in hospital follow-up in 02/2022 and was without symptoms of angina or cardiac decompensation.  She did note some mild positional dizziness with recommendation to decrease Toprol-XL to 12.5 mg daily.  Follow-up labs obtained in 04/2022 showed an improved LDL of 80 (prior 161 not on statin therapy).  Prescription for PCSK9  inhibitor was sent in.  She was last seen in the office in 04/2022, noting some intermittently randomly occurring chest tightness and shortness of breath without frank chest pain.  She also noted some left-sided neck discomfort and headache.  She was started on Imdur 15 mg.  Limited echo in 05/2022 demonstrated an EF of 60 to 65%, no regional wall motion abnormalities, normal LV diastolic function parameters, normal RV systolic function and ventricular cavity size, no significant valvular abnormalities, and an estimated right atrial pressure of 3 mmHg.  Carotid artery ultrasound in 05/2022 showed no evidence of carotid artery stenosis with antegrade flow of the bilateral vertebral arteries and a normal flow hemodynamics in the bilateral subclavian arteries.  She comes in doing well from a cardiac perspective and is without symptoms of angina or cardiac decompensation.  No further randomly occurring chest tightness.  She did have an episode of of palpitations approximately 3 weeks ago that lasted for several minutes with spontaneous resolution.  No further episodes of palpitations.  No presyncope or syncope.  No lower extremity swelling, abdominal ascension, or progressive orthopnea.  No falls or symptoms concerning for bleeding.  Adherent and tolerating all cardiac medications.  She is now on Praluent.   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[facility])     Past Surgical History:  Procedure Laterality Date   COLONOSCOPY WITH PROPOFOL N/A 03/30/2020   Procedure: COLONOSCOPY WITH PROPOFOL;  Surgeon: Pasty Spillers, MD;  Location: [facility] ENDOSCOPY;  Service: Endoscopy;  Laterality:  N/A;  COVID POSITIVE 02/29/2020   CORONARY/GRAFT ACUTE MI REVASCULARIZATION N/A 01/23/2022   Procedure: Coronary/Graft Acute MI Revascularization;  Surgeon: Connie Kendall, MD;  Location: [facility] INVASIVE CV LAB;  Service: Cardiovascular;  Laterality: N/A;   ESOPHAGOGASTRODUODENOSCOPY (EGD) WITH PROPOFOL N/A 03/30/2020   Procedure: ESOPHAGOGASTRODUODENOSCOPY (EGD) WITH PROPOFOL;  Surgeon: Pasty Spillers, MD;  Location: [facility] ENDOSCOPY;  Service: Endoscopy;  Laterality: N/A;   ESOPHAGOGASTRODUODENOSCOPY (EGD) WITH PROPOFOL N/A 08/03/2020   Procedure: ESOPHAGOGASTRODUODENOSCOPY (EGD) WITH PROPOFOL;  Surgeon: Pasty Spillers, MD;  Location: [facility] ENDOSCOPY;  Service: Endoscopy;  Laterality: N/A;   ESOPHAGOGASTRODUODENOSCOPY (EGD) WITH PROPOFOL N/A 01/25/2021   Procedure: ESOPHAGOGASTRODUODENOSCOPY (EGD) WITH PROPOFOL;  Surgeon: Pasty Spillers, MD;  Location: [facility] ENDOSCOPY;  Service: Endoscopy;  Laterality: N/A;   ESOPHAGOGASTRODUODENOSCOPY (EGD) WITH PROPOFOL N/A 09/05/2021   Procedure: ESOPHAGOGASTRODUODENOSCOPY (EGD) WITH PROPOFOL;  Surgeon: Toney Reil, MD;  Location: [facility] ENDOSCOPY;  Service: Gastroenterology;  Laterality: N/A;   EXCISION MASS NECK Left 01/18/2019   Procedure: EXCISION MASS NECK/NODE;  Surgeon: Vernie Murders, MD;  Location: [facility] ORS;  Service: ENT;  Laterality: Left;   IR REMOVAL TUN ACCESS W/ PORT W/O FL MOD SED  12/05/2021   LARYNGOSCOPY Bilateral 02/22/2019   Procedure: MICROSCOPIC DIRECT LARYNGOSCOPY AND BIOPSY;  Surgeon: Vernie Murders, MD;  Location: [facility] ORS;  Service: ENT;  Laterality: Bilateral;   LEFT HEART CATH AND CORONARY ANGIOGRAPHY N/A 01/23/2022   Procedure: LEFT HEART CATH AND CORONARY ANGIOGRAPHY;  Surgeon: Connie Kendall, MD;  Location: [facility] INVASIVE CV LAB;  Service: Cardiovascular;  Laterality: N/A;   PORTA CATH INSERTION N/A 03/24/2019   Procedure: PORTA CATH INSERTION;  Surgeon: Renford Dills, MD;  Location: [facility] INVASIVE CV LAB;   Service: Cardiovascular;  Laterality: N/A;   TONSILLECTOMY     TUBAL LIGATION      Current Medications: Current Meds  Medication Sig   Alirocumab (PRALUENT) 75 MG/ML SOAJ INJECT 75 MG SUBCUTANEOUSLY EVERY 14 DAYS   aspirin 81 MG chewable tablet Chew 1 tablet (81 mg total) by mouth daily.   atorvastatin (LIPITOR) 80 MG tablet TAKE 1 TABLET BY MOUTH EVERY DAY   Docusate Sodium (DSS) 100 MG CAPS Take 1 capsule by mouth 2 (two) times daily.   ezetimibe (ZETIA) 10 MG tablet TAKE 1 TABLET BY MOUTH EVERY DAY   gabapentin (NEURONTIN) 300 MG capsule Take 300 mg by mouth 2 (two) times daily.   hydrOXYzine (ATARAX/VISTARIL) 25 MG tablet Take 25 mg by mouth daily as needed for nausea, vomiting or anxiety.   isosorbide mononitrate (IMDUR) 30 MG 24 hr tablet TAKE 1/2 OF A TABLET (15 MG TOTAL) BY MOUTH DAILY   meclizine (ANTIVERT) 12.5 MG tablet Take 1 tablet (12.5 mg total) by mouth 3 (three) times daily as needed for dizziness or nausea.   metoprolol succinate (TOPROL-XL) 25 MG 24 hr tablet Take 0.5 tablets (12.5 mg total) by mouth daily. Take with or immediately following a meal.   pantoprazole (PROTONIX) 40 MG tablet Take 1 tablet (40 mg total) by mouth daily.   sertraline (ZOLOFT) 100 MG tablet Take 100 mg by mouth daily.   ticagrelor (BRILINTA) 90 MG TABS tablet  Take 1 tablet (90 mg total) by mouth 2 (two) times daily.   Vitamin D, Ergocalciferol, (DRISDOL) 1.25 MG (50000 UNIT) CAPS capsule Take 50,000 Units by mouth once a week.    Allergies:   Chlorhexidine   Social History   Socioeconomic History   Marital status: Single    Spouse name: Not on file   Number of children: Not on file   Years of education: Not on file   Highest education level: Not on file  Occupational History   Not on file  Tobacco Use   Smoking status: Never   Smokeless tobacco: Never  Vaping Use   Vaping Use: Never used  Substance and Sexual Activity   Alcohol use: Not Currently   Drug use: No   Sexual  activity: Not on file  Other Topics Concern   Not on file  Social History Narrative   Not on file   Social Determinants of Health   Financial Resource Strain: Not on file  Food Insecurity: Not on file  Transportation Needs: Not on file  Physical Activity: Not on file  Stress: Not on file  Social Connections: Not on file     Family History:  The patient's family history includes Brain cancer in her paternal grandfather; Breast cancer in her sister.  ROS:   12-point review of systems is negative unless otherwise noted in the HPI.   EKGs/Labs/Other Studies Reviewed:    Studies reviewed were summarized above. The additional studies were reviewed today:  2D echo 01/23/2022: 1. Left ventricular ejection fraction, by estimation, is 60 to 65%. The  left ventricle has normal function. The left ventricle has no regional  wall motion abnormalities. There is mild left ventricular hypertrophy.  Left ventricular diastolic parameters  were normal.   2. Right ventricular systolic function is normal. The right ventricular  size is not well visualized.   3. The mitral valve is normal in structure. Mild mitral valve  regurgitation.   4. The aortic valve is tricuspid. Aortic valve regurgitation is not  visualized. Aortic valve sclerosis is present, with no evidence of aortic  valve stenosis.  __________   LHC 01/23/2022: Conclusions: Severe single-vessel coronary artery disease with 99% mid RCA stenosis, likely due to acute plaque rupture, with TIMI-2 flow.  There is also a hazy 50-60% proximal RCA stenosis as well as moderate to severe but not critical left coronary artery disease as detailed below. Normal left ventricular filling pressure (LVEDP 12 mmHg). Successful PCI to proximal and mid RCA stenoses using nonoverlapping Onyx Frontier 3.0 x 12 mm (proximal) and 2.75 x 18 mm (mid) drug-eluting stents with 0% residual stenosis and TIMI-3 flow.  Jailed RV marginal branch arising from the  mid RCA demonstrates 70% stenosis pre-PCI, increased to 90% plaque shift.  There is TIMI-2 flow at the end of the procedure through this small branch, which is too small for intervention.   Recommendations: Admit to ICU for post STEMI/PCI monitoring. Dual antiplatelet therapy with aspirin and ticagrelor for at least 12 months. Aggressive secondary prevention of coronary artery disease. Obtain echocardiogram. Favor medical therapy of the left coronary artery disease, as the most severe stenoses involve small/distal branches.   EKG:  EKG is ordered today.  The EKG ordered today demonstrates NSR, 69 bpm, low voltage QRS, no acute ST-T changes, consistent with prior tracing  Recent Labs: 01/25/2022: Magnesium 2.5 05/23/2022: ALT 18; BUN 19; Creatinine 1.07; Hemoglobin 12.4; Platelet Count 230; Potassium 4.3; Sodium 139; TSH 5.455  Recent Lipid  Panel    Component Value Date/Time   CHOL 151 04/12/2022 1020   TRIG 128 04/12/2022 1020   HDL 45 04/12/2022 1020   CHOLHDL 3.4 04/12/2022 1020   VLDL 26 04/12/2022 1020   LDLCALC 80 04/12/2022 1020    PHYSICAL EXAM:    VS:  BP 104/72   Pulse 69   Ht 5\' 2"  (1.575 m)   Wt 159 lb 3.2 oz (72.2 kg)   SpO2 98%   BMI 29.12 kg/m   BMI: Body mass index is 29.12 kg/m.  Physical Exam Vitals reviewed.  Constitutional:      Appearance: She is well-developed.  HENT:     Head: Normocephalic and atraumatic.  Eyes:     General:        Right eye: No discharge.        Left eye: No discharge.  Neck:     Vascular: No JVD.  Cardiovascular:     Rate and Rhythm: Normal rate and regular rhythm.     Heart sounds: Normal heart sounds, S1 normal and S2 normal. Heart sounds not distant. No midsystolic click and no opening snap. No murmur heard.    No friction rub.  Pulmonary:     Effort: Pulmonary effort is normal. No respiratory distress.     Breath sounds: Normal breath sounds. No decreased breath sounds, wheezing or rales.  Chest:     Chest wall: No  tenderness.  Abdominal:     General: There is no distension.  Musculoskeletal:     Cervical back: Normal range of motion.     Right lower leg: No edema.     Left lower leg: No edema.  Skin:    General: Skin is warm and dry.     Nails: There is no clubbing.  Neurological:     Mental Status: She is alert and oriented to person, place, and time.  Psychiatric:        Speech: Speech normal.        Behavior: Behavior normal.        Thought Content: Thought content normal.        Judgment: Judgment normal.     Wt Readings from Last 3 Encounters:  07/19/22 159 lb 3.2 oz (72.2 kg)  05/23/22 162 lb (73.5 kg)  04/19/22 160 lb 8 oz (72.8 kg)     ASSESSMENT & PLAN:   CAD in the native coronary arteries with recent inferior ST elevation MI without angina: She is doing well and without symptoms concerning for angina or cardiac decompensation.  Continue aggressive risk factor modification including DAPT with aspirin 81 mg daily and ticagrelor 90 mg twice daily without interruption for a minimum of 12 months dating back to date of PCI (01/23/2022).  She otherwise remains on Toprol-XL, Praluent, ezetimibe, and atorvastatin.  She has previously been referred to cardiac rehab.  No indication for further ischemic testing at this time.  Importance of DAPT was discussed.  NSVT: Likely in the setting of acute MI.  No ectopy noted on EKG today.  She does report 1 episode of tachypalpitations occurring 3 weeks ago, none since.  Given she has been without symptoms of palpitations since, the likelihood of Zio patch capturing arrhythmia is quite low.  She will notify our office if palpitations return.  She remains on Toprol-XL.  HLD: LDL 80 with a triglyceride of 128 in 04/2022 with goal LDL being less than 55.  Now on Praluent.  She also remains on atorvastatin and ezetimibe.  Obtain fasting lipid panel and LFT today.  If able, would look to consolidate lipid-lowering therapy now that she is on PCSK9  inhibitor.   Disposition: F/u with Dr. Okey Dupre or an APP in 6 months.   Medication Adjustments/Labs and Tests Ordered: Current medicines are reviewed at length with the patient today.  Concerns regarding medicines are outlined above. Medication changes, Labs and Tests ordered today are summarized above and listed in the Patient Instructions accessible in Encounters.   Signed, Eula Listen, PA-C 07/19/2022 10:59 AM     [facility] [place] [address] [place], [place] [address] [phone]

## 2022-07-19 ENCOUNTER — Encounter: Payer: Self-pay | Admitting: Physician Assistant

## 2022-07-19 ENCOUNTER — Ambulatory Visit: Payer: Medicare Other | Attending: Physician Assistant | Admitting: Physician Assistant

## 2022-07-19 ENCOUNTER — Other Ambulatory Visit: Payer: Self-pay | Admitting: *Deleted

## 2022-07-19 ENCOUNTER — Other Ambulatory Visit
Admission: RE | Admit: 2022-07-19 | Discharge: 2022-07-19 | Disposition: A | Discharge status: Home / Self Care | Payer: Medicare Other | Attending: Physician Assistant | Admitting: Physician Assistant

## 2022-07-19 VITALS — BP 104/72 | HR 69 | Ht 62.0 in | Wt 159.2 lb

## 2022-07-19 DIAGNOSIS — I4729 Other ventricular tachycardia: Secondary | ICD-10-CM | POA: Insufficient documentation

## 2022-07-19 DIAGNOSIS — E785 Hyperlipidemia, unspecified: Secondary | ICD-10-CM | POA: Insufficient documentation

## 2022-07-19 DIAGNOSIS — I2510 Atherosclerotic heart disease of native coronary artery without angina pectoris: Secondary | ICD-10-CM

## 2022-07-19 DIAGNOSIS — C01 Malignant neoplasm of base of tongue: Secondary | ICD-10-CM | POA: Insufficient documentation

## 2022-07-19 LAB — LIPID PANEL
Cholesterol: 117 mg/dL (ref 0–200)
HDL: 45 mg/dL (ref >40)
LDL Cholesterol: 52 mg/dL (ref 0–99)
Total CHOL/HDL Ratio: 2.6 RATIO
Triglycerides: 99 mg/dL (ref <150)
VLDL: 20 mg/dL (ref 0–40)

## 2022-07-19 LAB — VITAMIN B12: Vitamin B-12: 531 pg/mL (ref 180–914)

## 2022-07-19 LAB — CBC WITH DIFFERENTIAL (CANCER CENTER ONLY)
Abs Immature Granulocytes: 0.03 10*3/uL (ref 0.00–0.07)
Basophils Absolute: 0 10*3/uL (ref 0.0–0.1)
Basophils Relative: 1 %
Eosinophils Absolute: 0.1 10*3/uL (ref 0.0–0.5)
Eosinophils Relative: 2 %
HCT: 39.1 % (ref 36.0–46.0)
Hemoglobin: 12.6 g/dL (ref 12.0–15.0)
Immature Granulocytes: 1 %
Lymphocytes Relative: 17 %
Lymphs Abs: 1 10*3/uL (ref 0.7–4.0)
MCH: 28.8 pg (ref 26.0–34.0)
MCHC: 32.2 g/dL (ref 30.0–36.0)
MCV: 89.5 fL (ref 80.0–100.0)
Monocytes Absolute: 0.4 10*3/uL (ref 0.1–1.0)
Monocytes Relative: 7 %
Neutro Abs: 4.3 10*3/uL (ref 1.7–7.7)
Neutrophils Relative %: 72 %
Platelet Count: 228 10*3/uL (ref 150–400)
RBC: 4.37 MIL/uL (ref 3.87–5.11)
RDW: 13.9 % (ref 11.5–15.5)
WBC Count: 5.9 10*3/uL (ref 4.0–10.5)
nRBC: 0 % (ref 0.0–0.2)

## 2022-07-19 LAB — CMP (CANCER CENTER ONLY)
ALT: 20 U/L (ref 0–44)
AST: 32 U/L (ref 15–41)
Albumin: 4.5 g/dL (ref 3.5–5.0)
Alkaline Phosphatase: 93 U/L (ref 38–126)
Anion gap: 11 (ref 5–15)
BUN: 16 mg/dL (ref 8–23)
CO2: 24 mmol/L (ref 22–32)
Calcium: 9.9 mg/dL (ref 8.9–10.3)
Chloride: 106 mmol/L (ref 98–111)
Creatinine: 1.01 mg/dL — ABNORMAL HIGH (ref 0.44–1.00)
GFR, Estimated: >60 mL/min (ref >60)
Glucose, Bld: 113 mg/dL — ABNORMAL HIGH (ref 70–99)
Potassium: 3.8 mmol/L (ref 3.5–5.1)
Sodium: 141 mmol/L (ref 135–145)
Total Bilirubin: 0.9 mg/dL (ref 0.3–1.2)
Total Protein: 8 g/dL (ref 6.5–8.1)

## 2022-07-19 NOTE — Patient Instructions (Signed)
Medication Instructions:  No changes at this time.   *If you need a refill on your cardiac medications before your next appointment, please call your pharmacy*   Lab Work: Lipid & liver panel today over at [facility] entrance. Sign in at registration desk.   If you have labs (blood work) drawn today and your tests are completely normal, you will receive your results only by: MyChart Message (if you have MyChart) OR A paper copy in the mail If you have any lab test that is abnormal or we need to change your treatment, we will call you to review the results.   Testing/Procedures: None   Follow-Up: At [facility], you and your health needs are our priority.  As part of our continuing mission to provide you with exceptional heart care, we have created designated Provider Care Teams.  These Care Teams include your primary Cardiologist (physician) and Advanced Practice Providers (APPs -  Physician Assistants and Nurse Practitioners) who all work together to provide you with the care you need, when you need it.   Your next appointment:   6 month(s)  Provider:   Yvonne Kendall, MD or Eula Listen, PA-C

## 2022-07-20 LAB — THYROID PANEL WITH TSH
Free Thyroxine Index: 1.9 (ref 1.2–4.9)
T3 Uptake Ratio: 24 % (ref 24–39)
T4, Total: 8 ug/dL (ref 4.5–12.0)
TSH: 3.9 u[IU]/mL (ref 0.450–4.500)

## 2022-07-23 ENCOUNTER — Inpatient Hospital Stay: Payer: Medicare Other | Attending: Internal Medicine

## 2022-07-23 DIAGNOSIS — D519 Vitamin B12 deficiency anemia, unspecified: Secondary | ICD-10-CM | POA: Diagnosis not present

## 2022-07-23 DIAGNOSIS — Z79899 Other long term (current) drug therapy: Secondary | ICD-10-CM | POA: Insufficient documentation

## 2022-07-23 DIAGNOSIS — T451X5A Adverse effect of antineoplastic and immunosuppressive drugs, initial encounter: Secondary | ICD-10-CM

## 2022-07-23 MED ORDER — CYANOCOBALAMIN 1000 MCG/ML IJ SOLN
1000.0000 ug | Freq: Once | INTRAMUSCULAR | Status: AC
  Administered 2022-07-23: 1000 ug via INTRAMUSCULAR
  Filled 2022-07-23: qty 1

## 2022-08-18 ENCOUNTER — Other Ambulatory Visit: Payer: Self-pay | Admitting: Physician Assistant

## 2022-08-22 ENCOUNTER — Inpatient Hospital Stay: Payer: Medicare Other | Attending: Internal Medicine

## 2022-08-22 DIAGNOSIS — T451X5A Adverse effect of antineoplastic and immunosuppressive drugs, initial encounter: Secondary | ICD-10-CM

## 2022-08-22 DIAGNOSIS — D519 Vitamin B12 deficiency anemia, unspecified: Secondary | ICD-10-CM | POA: Diagnosis not present

## 2022-08-22 MED ORDER — CYANOCOBALAMIN 1000 MCG/ML IJ SOLN
1000.0000 ug | Freq: Once | INTRAMUSCULAR | Status: AC
  Administered 2022-08-22: 1000 ug via INTRAMUSCULAR
  Filled 2022-08-22: qty 1

## 2022-08-30 ENCOUNTER — Other Ambulatory Visit: Payer: Self-pay | Admitting: Physician Assistant

## 2022-08-30 NOTE — Telephone Encounter (Signed)
recommended to continue Atorvastatin and stop Zetia on 07/19/22 lab result note.

## 2022-09-23 ENCOUNTER — Inpatient Hospital Stay: Payer: Medicare Other | Attending: Internal Medicine

## 2022-09-23 DIAGNOSIS — D701 Agranulocytosis secondary to cancer chemotherapy: Secondary | ICD-10-CM

## 2022-09-23 DIAGNOSIS — D519 Vitamin B12 deficiency anemia, unspecified: Secondary | ICD-10-CM | POA: Insufficient documentation

## 2022-09-23 MED ORDER — CYANOCOBALAMIN 1000 MCG/ML IJ SOLN
1000.0000 ug | Freq: Once | INTRAMUSCULAR | Status: AC
  Administered 2022-09-23: 1000 ug via INTRAMUSCULAR
  Filled 2022-09-23: qty 1

## 2022-10-04 ENCOUNTER — Other Ambulatory Visit: Payer: Self-pay | Admitting: Physician Assistant

## 2022-10-23 ENCOUNTER — Inpatient Hospital Stay: Payer: Medicare Other | Attending: Internal Medicine

## 2022-10-23 DIAGNOSIS — T451X5A Adverse effect of antineoplastic and immunosuppressive drugs, initial encounter: Secondary | ICD-10-CM

## 2022-10-23 DIAGNOSIS — E538 Deficiency of other specified B group vitamins: Secondary | ICD-10-CM | POA: Insufficient documentation

## 2022-10-23 MED ORDER — CYANOCOBALAMIN 1000 MCG/ML IJ SOLN
1000.0000 ug | Freq: Once | INTRAMUSCULAR | Status: AC
  Administered 2022-10-23: 1000 ug via INTRAMUSCULAR
  Filled 2022-10-23: qty 1

## 2022-11-22 ENCOUNTER — Inpatient Hospital Stay: Payer: Medicare Other

## 2022-11-22 ENCOUNTER — Other Ambulatory Visit: Payer: Medicare Other

## 2022-11-22 ENCOUNTER — Ambulatory Visit: Payer: Medicare Other | Admitting: Nurse Practitioner

## 2022-11-28 ENCOUNTER — Other Ambulatory Visit: Payer: Self-pay | Admitting: *Deleted

## 2022-11-28 DIAGNOSIS — C01 Malignant neoplasm of base of tongue: Secondary | ICD-10-CM

## 2022-11-29 ENCOUNTER — Inpatient Hospital Stay: Payer: Medicare Other | Attending: Internal Medicine

## 2022-11-29 ENCOUNTER — Other Ambulatory Visit: Payer: Self-pay | Admitting: Internal Medicine

## 2022-11-29 ENCOUNTER — Inpatient Hospital Stay (HOSPITAL_BASED_OUTPATIENT_CLINIC_OR_DEPARTMENT_OTHER): Payer: Medicare Other | Admitting: Nurse Practitioner

## 2022-11-29 ENCOUNTER — Other Ambulatory Visit: Payer: Self-pay | Admitting: Physician Assistant

## 2022-11-29 ENCOUNTER — Encounter: Payer: Self-pay | Admitting: Nurse Practitioner

## 2022-11-29 ENCOUNTER — Inpatient Hospital Stay: Payer: Medicare Other

## 2022-11-29 VITALS — BP 133/90 | HR 65 | Temp 98.2°F | Ht 62.0 in | Wt 164.0 lb

## 2022-11-29 DIAGNOSIS — Z08 Encounter for follow-up examination after completed treatment for malignant neoplasm: Secondary | ICD-10-CM | POA: Diagnosis not present

## 2022-11-29 DIAGNOSIS — C01 Malignant neoplasm of base of tongue: Secondary | ICD-10-CM

## 2022-11-29 DIAGNOSIS — D649 Anemia, unspecified: Secondary | ICD-10-CM

## 2022-11-29 DIAGNOSIS — Z85810 Personal history of malignant neoplasm of tongue: Secondary | ICD-10-CM | POA: Insufficient documentation

## 2022-11-29 DIAGNOSIS — Z8589 Personal history of malignant neoplasm of other organs and systems: Secondary | ICD-10-CM

## 2022-11-29 DIAGNOSIS — E538 Deficiency of other specified B group vitamins: Secondary | ICD-10-CM | POA: Diagnosis present

## 2022-11-29 DIAGNOSIS — T451X5A Adverse effect of antineoplastic and immunosuppressive drugs, initial encounter: Secondary | ICD-10-CM

## 2022-11-29 LAB — CBC WITH DIFFERENTIAL (CANCER CENTER ONLY)
Abs Immature Granulocytes: 0.03 10*3/uL (ref 0.00–0.07)
Basophils Absolute: 0 10*3/uL (ref 0.0–0.1)
Basophils Relative: 1 %
Eosinophils Absolute: 0.1 10*3/uL (ref 0.0–0.5)
Eosinophils Relative: 1 %
HCT: 35.6 % — ABNORMAL LOW (ref 36.0–46.0)
Hemoglobin: 11.8 g/dL — ABNORMAL LOW (ref 12.0–15.0)
Immature Granulocytes: 1 %
Lymphocytes Relative: 20 %
Lymphs Abs: 1.1 10*3/uL (ref 0.7–4.0)
MCH: 29.9 pg (ref 26.0–34.0)
MCHC: 33.1 g/dL (ref 30.0–36.0)
MCV: 90.1 fL (ref 80.0–100.0)
Monocytes Absolute: 0.4 10*3/uL (ref 0.1–1.0)
Monocytes Relative: 8 %
Neutro Abs: 3.8 10*3/uL (ref 1.7–7.7)
Neutrophils Relative %: 69 %
Platelet Count: 210 10*3/uL (ref 150–400)
RBC: 3.95 MIL/uL (ref 3.87–5.11)
RDW: 14 % (ref 11.5–15.5)
WBC Count: 5.4 10*3/uL (ref 4.0–10.5)
nRBC: 0 % (ref 0.0–0.2)

## 2022-11-29 LAB — CMP (CANCER CENTER ONLY)
ALT: 20 U/L (ref 0–44)
AST: 27 U/L (ref 15–41)
Albumin: 3.9 g/dL (ref 3.5–5.0)
Alkaline Phosphatase: 89 U/L (ref 38–126)
Anion gap: 8 (ref 5–15)
BUN: 16 mg/dL (ref 8–23)
CO2: 24 mmol/L (ref 22–32)
Calcium: 9 mg/dL (ref 8.9–10.3)
Chloride: 108 mmol/L (ref 98–111)
Creatinine: 1.03 mg/dL — ABNORMAL HIGH (ref 0.44–1.00)
GFR, Estimated: >60 mL/min (ref >60)
Glucose, Bld: 115 mg/dL — ABNORMAL HIGH (ref 70–99)
Potassium: 3.8 mmol/L (ref 3.5–5.1)
Sodium: 140 mmol/L (ref 135–145)
Total Bilirubin: 0.7 mg/dL (ref 0.3–1.2)
Total Protein: 6.6 g/dL (ref 6.5–8.1)

## 2022-11-29 LAB — VITAMIN B12: Vitamin B-12: 374 pg/mL (ref 180–914)

## 2022-11-29 MED ORDER — CYANOCOBALAMIN 1000 MCG/ML IJ SOLN
1000.0000 ug | Freq: Once | INTRAMUSCULAR | Status: AC
  Administered 2022-11-29: 1000 ug via INTRAMUSCULAR
  Filled 2022-11-29: qty 1

## 2022-11-29 NOTE — Progress Notes (Signed)
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  HISTORY OF PRESENTING ILLNESS: Patient ambulating-independently. Alone.  Connie Cameron 62 y.o. female with history of base of tongue squamous cell carcinoma and b12 deficiency returns to clinic for follow up. She denies head and/or neck pain. No difficulty swallowing. Denies new lumps or masses. No unintentional weight loss. She continues follow up with ENT.    Review of Systems  Constitutional:  Positive for malaise/fatigue. Negative for chills, diaphoresis, fever and weight loss.  HENT:  Negative for nosebleeds and sore throat.   Eyes:  Negative for double vision.  Respiratory:  Negative for cough, hemoptysis, sputum production, shortness of breath and wheezing.   Cardiovascular:  Negative for chest pain, palpitations, orthopnea and leg swelling.  Gastrointestinal:  Negative for abdominal pain, blood in stool, constipation, diarrhea, heartburn, melena, nausea and vomiting.  Genitourinary:  Negative for dysuria, frequency and urgency.  Musculoskeletal:  Positive for back pain and joint pain.  Skin: Negative.  Negative for itching and rash.  Neurological:  Negative for dizziness, tingling, focal weakness, weakness and headaches.  Endo/Heme/Allergies:  Does not bruise/bleed easily.  Psychiatric/Behavioral:  Negative for depression. The patient is not nervous/anxious and does not have insomnia.      MEDICAL HISTORY:  Past Medical History:  Diagnosis Date   Anxiety    Depression    GERD (gastroesophageal reflux  disease)    Throat cancer (HCC) 2021   Tongue cancer (HCC)     SURGICAL HISTORY: Past Surgical History:  Procedure Laterality Date   COLONOSCOPY WITH PROPOFOL N/A 03/30/2020   Procedure: COLONOSCOPY WITH PROPOFOL;  Surgeon: Pasty Spillers, MD;  Location: [facility] ENDOSCOPY;  Service: Endoscopy;  Laterality: N/A;  COVID POSITIVE 02/29/2020   CORONARY/GRAFT ACUTE MI REVASCULARIZATION N/A 01/23/2022   Procedure: Coronary/Graft Acute MI Revascularization;  Surgeon: Yvonne Kendall, MD;  Location: [facility] INVASIVE CV LAB;  Service: Cardiovascular;  Laterality: N/A;   ESOPHAGOGASTRODUODENOSCOPY (EGD) WITH PROPOFOL N/A 03/30/2020   Procedure: ESOPHAGOGASTRODUODENOSCOPY (EGD) WITH PROPOFOL;  Surgeon: Pasty Spillers, MD;  Location: [facility] ENDOSCOPY;  Service: Endoscopy;  Laterality: N/A;   ESOPHAGOGASTRODUODENOSCOPY (EGD) WITH PROPOFOL N/A 08/03/2020   Procedure: ESOPHAGOGASTRODUODENOSCOPY (EGD) WITH PROPOFOL;  Surgeon: Pasty Spillers, MD;  Location: [facility] ENDOSCOPY;  Service: Endoscopy;  Laterality: N/A;   ESOPHAGOGASTRODUODENOSCOPY (EGD) WITH PROPOFOL N/A 01/25/2021   Procedure: ESOPHAGOGASTRODUODENOSCOPY (EGD) WITH PROPOFOL;  Surgeon: Pasty Spillers, MD;  Location: [facility] ENDOSCOPY;  Service: Endoscopy;  Laterality: N/A;   ESOPHAGOGASTRODUODENOSCOPY (EGD) WITH PROPOFOL N/A 09/05/2021   Procedure: ESOPHAGOGASTRODUODENOSCOPY (EGD) WITH PROPOFOL;  Surgeon: Toney Reil, MD;  Location: [facility] ENDOSCOPY;  Service: Gastroenterology;  Laterality: N/A;   EXCISION MASS NECK Left 01/18/2019   Procedure: EXCISION MASS NECK/NODE;  Surgeon: Vernie Murders, MD;  Location: [facility] ORS;  Service: ENT;  Laterality: Left;   IR REMOVAL TUN ACCESS W/ PORT W/O FL MOD SED  12/05/2021   LARYNGOSCOPY Bilateral 02/22/2019   Procedure: MICROSCOPIC DIRECT LARYNGOSCOPY AND BIOPSY;  Surgeon: Vernie Murders, MD;  Location: [facility] ORS;  Service: ENT;  Laterality: Bilateral;   LEFT HEART CATH AND CORONARY ANGIOGRAPHY N/A  01/23/2022   Procedure: LEFT  HEART CATH AND CORONARY ANGIOGRAPHY;  Surgeon: Yvonne Kendall, MD;  Location: [facility];  Service: Cardiovascular;  Laterality: N/A;   PORTA CATH INSERTION N/A 03/24/2019   Procedure: PORTA CATH INSERTION;  Surgeon: Renford Dills, MD;  Location: [facility] CV [facility];  Service: Cardiovascular;  Laterality: N/A;   TONSILLECTOMY     TUBAL LIGATION      SOCIAL HISTORY: Social History   Socioeconomic History   Marital status: Single    Spouse name: Not on file   Number of children: Not on file   Years of education: Not on file   Highest education level: Not on file  Occupational History   Not on file  Tobacco Use   Smoking status: Never   Smokeless tobacco: Never  Vaping Use   Vaping status: Never Used  Substance and Sexual Activity   Alcohol use: Not Currently   Drug use: No   Sexual activity: Not on file  Other Topics Concern   Not on file  Social History Narrative   Not on file   Social Determinants of Health   Financial Resource Strain: Not on file  Food Insecurity: Not on file  Transportation Needs: Not on file  Physical Activity: Not on file  Stress: Not on file  Social Connections: Not on file  Intimate Partner Violence: Not on file   FAMILY HISTORY: Family History  Problem Relation Age of Onset   Breast cancer Sister    Brain cancer Paternal Grandfather    ALLERGIES:  is allergic to chlorhexidine.  MEDICATIONS:  Current Outpatient Medications  Medication Sig Dispense Refill   Alirocumab (PRALUENT) 75 MG/ML SOAJ INJECT 75 MG SUBCUTANEOUSLY EVERY 14 DAYS 6 mL 1   aspirin 81 MG chewable tablet Chew 1 tablet (81 mg total) by mouth daily. 30 tablet 1   atorvastatin (LIPITOR) 80 MG tablet TAKE 1 TABLET BY MOUTH EVERY DAY 90 tablet 1   BRILINTA 90 MG TABS tablet TAKE 1 TABLET BY MOUTH 2 TIMES DAILY. 180 tablet 0   Docusate Sodium (DSS) 100 MG CAPS Take 1 capsule by mouth 2 (two) times daily.     gabapentin  (NEURONTIN) 300 MG capsule Take 300 mg by mouth 2 (two) times daily.     hydrOXYzine (ATARAX/VISTARIL) 25 MG tablet Take 25 mg by mouth daily as needed for nausea, vomiting or anxiety.     isosorbide mononitrate (IMDUR) 30 MG 24 hr tablet TAKE 1/2 OF A TABLET (15 MG TOTAL) BY MOUTH DAILY 45 tablet 1   meclizine (ANTIVERT) 12.5 MG tablet Take 1 tablet (12.5 mg total) by mouth 3 (three) times daily as needed for dizziness or nausea. 30 tablet 0   metoprolol succinate (TOPROL-XL) 25 MG 24 hr tablet Take 0.5 tablets (12.5 mg total) by mouth daily. Take with or immediately following a meal. 45 tablet 3   pantoprazole (PROTONIX) 40 MG tablet Take 1 tablet (40 mg total) by mouth daily. 30 tablet 1   sertraline (ZOLOFT) 100 MG tablet Take 100 mg by mouth daily.     Vitamin D, Ergocalciferol, (DRISDOL) 1.25 MG (50000 UNIT) CAPS capsule Take 50,000 Units by mouth once a week.     No current facility-administered medications for this visit.   Facility-Administered Medications Ordered in Other Visits  Medication Dose Route Frequency Provider Last Rate Last Admin   cyanocobalamin (VITAMIN B12) injection 1,000 mcg  1,000 mcg Intramuscular Once Earna Coder, MD        PHYSICAL EXAMINATION: Vitals:  11/29/22 1257  BP: (!) 133/90  Pulse: 65  Temp: 98.2 F (36.8 C)  SpO2: 99%   Filed Weights   11/29/22 1257  Weight: 164 lb (74.4 kg)   Physical Exam Constitutional:      Appearance: She is not ill-appearing.  Eyes:     General: No scleral icterus.    Conjunctiva/sclera: Conjunctivae normal.  Cardiovascular:     Rate and Rhythm: Normal rate and regular rhythm.  Abdominal:     General: There is no distension.     Palpations: Abdomen is soft.     Tenderness: There is no abdominal tenderness. There is no guarding.  Musculoskeletal:        General: No deformity.     Cervical back: Neck supple.     Right lower leg: No edema.     Left lower leg: No edema.  Lymphadenopathy:     Cervical:  No cervical adenopathy.  Skin:    General: Skin is warm and dry.  Neurological:     Mental Status: She is alert and oriented to person, place, and time. Mental status is at baseline.  Psychiatric:        Mood and Affect: Mood normal.        Behavior: Behavior normal.    LABORATORY DATA:  I have reviewed the data as listed Lab Results  Component Value Date   WBC 5.4 11/29/2022   HGB 11.8 (L) 11/29/2022   HCT 35.6 (L) 11/29/2022   MCV 90.1 11/29/2022   PLT 210 11/29/2022   Recent Labs    04/12/22 1020 05/23/22 1043 07/19/22 1109  NA 140 139 141  K 4.5 4.3 3.8  CL 107 108 106  CO2 25 25 24   GLUCOSE 104* 126* 113*  BUN 15 19 16   CREATININE 0.98 1.07* 1.01*  CALCIUM 9.5 9.6 9.9  GFRNONAA >60 59* >60  PROT 7.2 7.7 8.0  ALBUMIN 4.0 4.4 4.5  AST 26 27 32  ALT 18 18 20   ALKPHOS 93 95 93  BILITOT 0.6 0.8 0.9   Lab Results  Component Value Date   VITAMINB12 374 11/29/2022   Lab Results  Component Value Date   TSH 2.010 11/29/2022     RADIOGRAPHIC STUDIES: I have personally reviewed the radiological images as listed and agreed with the findings in the report. No results found.  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 Continue follow up with ENT, Dr Elenore Rota. Clinically, NED. Asymptomatic. Continue surveillance. Would recommend imaging if concerning symptoms.    # Mild hypothyroidism: TSH 5.4- per PCP ? Causing fatigue. Recheck today 2.0. Follow up with PCP.    # Normocytic anemia- Hmg intermittently in upper 11 range. EGD colonoscopy-February 2022 [Dr.Tahiliani]- Hmg today 11.8. Monitor.    # Elevated BG- PBF- BG 126- ? Prediabetes. Today, 115. Follow up with PCP.    # History of B12 deficiency- Today, 374. Continue B12 injections monthly x6.    # Port/IV access- s/p port explantation- PIV    # DISPOSITION:  # B12 injection today;  # B12 injections monthly  x6  # follow up in 6 months- lab (cbc/cmp/thyroid profile/B12 levels), Dr Donneta Romberg, +/- B12- la  No problem-specific Assessment & Plan notes found for this encounter.  All questions were answered. The patient knows to call the clinic with any problems, questions or concerns.   Connie Dooms, NP 11/29/2022

## 2022-11-29 NOTE — Progress Notes (Signed)
C/o of pressure in center of chest, and vomiting x4 weeks.   Having headaches x6 weeks. Pain is different than usual headaches.

## 2022-11-30 LAB — THYROID PANEL WITH TSH
Free Thyroxine Index: 1.5 (ref 1.2–4.9)
T3 Uptake Ratio: 21 % — ABNORMAL LOW (ref 24–39)
T4, Total: 7 ug/dL (ref 4.5–12.0)
TSH: 2.01 u[IU]/mL (ref 0.450–4.500)

## 2022-12-30 ENCOUNTER — Inpatient Hospital Stay: Payer: Medicare Other | Attending: Internal Medicine

## 2022-12-30 DIAGNOSIS — E538 Deficiency of other specified B group vitamins: Secondary | ICD-10-CM | POA: Insufficient documentation

## 2022-12-30 DIAGNOSIS — D701 Agranulocytosis secondary to cancer chemotherapy: Secondary | ICD-10-CM

## 2022-12-30 MED ORDER — CYANOCOBALAMIN 1000 MCG/ML IJ SOLN
1000.0000 ug | Freq: Once | INTRAMUSCULAR | Status: AC
  Administered 2022-12-30: 1000 ug via INTRAMUSCULAR
  Filled 2022-12-30: qty 1

## 2023-01-20 ENCOUNTER — Encounter: Payer: Self-pay | Admitting: Oncology

## 2023-01-31 ENCOUNTER — Inpatient Hospital Stay: Payer: Medicare Other | Attending: Internal Medicine

## 2023-01-31 ENCOUNTER — Other Ambulatory Visit: Payer: Self-pay | Admitting: Physician Assistant

## 2023-01-31 DIAGNOSIS — D519 Vitamin B12 deficiency anemia, unspecified: Secondary | ICD-10-CM | POA: Diagnosis present

## 2023-01-31 DIAGNOSIS — D701 Agranulocytosis secondary to cancer chemotherapy: Secondary | ICD-10-CM

## 2023-01-31 MED ORDER — CYANOCOBALAMIN 1000 MCG/ML IJ SOLN
1000.0000 ug | Freq: Once | INTRAMUSCULAR | Status: AC
  Administered 2023-01-31: 1000 ug via INTRAMUSCULAR

## 2023-01-31 NOTE — Telephone Encounter (Signed)
Unable to LVM, fu appt needed.

## 2023-01-31 NOTE — Telephone Encounter (Signed)
Med d/c on 07/19/22 lab result

## 2023-01-31 NOTE — Telephone Encounter (Signed)
last office visit: 07/19/22 with plan to f/u in 6 months.    next office visit:  none/active recall  Please schedule f/u appt.  Thanks!

## 2023-02-24 ENCOUNTER — Encounter: Payer: Self-pay | Admitting: Oncology

## 2023-02-24 ENCOUNTER — Telehealth: Payer: Self-pay | Admitting: Physician Assistant

## 2023-02-24 NOTE — Telephone Encounter (Signed)
BRILINTA 90 MG TABS tablet 180 tablet 3 11/29/2022 --   Sig - Route: TAKE 1 TABLET BY MOUTH 2 TIMES DAILY. - Oral   Sent to pharmacy as: BRILINTA 90 MG Tab tablet   E-Prescribing Status: Receipt confirmed by pharmacy (11/29/2022  4:05 PM EDT)    Pharmacy  [id number] [place], [place] - [address]   Called the pharmacy to verify that they received the order for Brilinta on 11/29/22. Pharmacy confirmed that they received the order and there are refills remaining. The pharmacy will provide a refill. I called the patient to let her know that the pharmacy is working on refilling her Brilinta. Patient verbalized understanding.

## 2023-02-24 NOTE — Telephone Encounter (Signed)
*  STAT* If patient is at the pharmacy, call can be transferred to refill team.   1. Which medications need to be refilled? (please list name of each medication and dose if known) Brilinta   2. Would you like to learn more about the convenience, safety, & potential cost savings by using the [facility]?    3. Are you open to using the [facility] (Type [facility].   4. Which pharmacy/location (including street and city if local pharmacy) is medication to be sent to? [facility] [address] [place]   5. Do they need a 30 day or 90 day supply? 90 days and refills- please call today- took her last pill

## 2023-02-27 ENCOUNTER — Encounter: Payer: Self-pay | Admitting: Oncology

## 2023-03-03 ENCOUNTER — Inpatient Hospital Stay: Payer: Medicare Other | Attending: Internal Medicine

## 2023-03-03 ENCOUNTER — Ambulatory Visit: Payer: Medicare Other | Admitting: Physician Assistant

## 2023-03-03 DIAGNOSIS — E538 Deficiency of other specified B group vitamins: Secondary | ICD-10-CM | POA: Diagnosis present

## 2023-03-03 DIAGNOSIS — T451X5A Adverse effect of antineoplastic and immunosuppressive drugs, initial encounter: Secondary | ICD-10-CM

## 2023-03-03 MED ORDER — CYANOCOBALAMIN 1000 MCG/ML IJ SOLN
1000.0000 ug | Freq: Once | INTRAMUSCULAR | Status: AC
  Administered 2023-03-03: 1000 ug via INTRAMUSCULAR
  Filled 2023-03-03: qty 1

## 2023-03-04 ENCOUNTER — Ambulatory Visit (INDEPENDENT_AMBULATORY_CARE_PROVIDER_SITE_OTHER): Payer: Medicare Other | Admitting: Family Medicine

## 2023-03-04 ENCOUNTER — Encounter: Payer: Self-pay | Admitting: Physician Assistant

## 2023-03-04 ENCOUNTER — Encounter: Payer: Self-pay | Admitting: Family Medicine

## 2023-03-04 ENCOUNTER — Ambulatory Visit: Payer: Medicare Other | Attending: Physician Assistant | Admitting: Physician Assistant

## 2023-03-04 VITALS — BP 120/79 | HR 85 | Temp 97.3°F | Resp 16 | Ht 62.0 in | Wt 165.2 lb

## 2023-03-04 VITALS — BP 104/76 | HR 78 | Ht 62.0 in | Wt 162.0 lb

## 2023-03-04 DIAGNOSIS — E538 Deficiency of other specified B group vitamins: Secondary | ICD-10-CM | POA: Diagnosis not present

## 2023-03-04 DIAGNOSIS — I25118 Atherosclerotic heart disease of native coronary artery with other forms of angina pectoris: Secondary | ICD-10-CM | POA: Diagnosis not present

## 2023-03-04 DIAGNOSIS — R0602 Shortness of breath: Secondary | ICD-10-CM | POA: Diagnosis not present

## 2023-03-04 DIAGNOSIS — Z23 Encounter for immunization: Secondary | ICD-10-CM | POA: Insufficient documentation

## 2023-03-04 DIAGNOSIS — I4729 Other ventricular tachycardia: Secondary | ICD-10-CM | POA: Diagnosis present

## 2023-03-04 DIAGNOSIS — I2089 Other forms of angina pectoris: Secondary | ICD-10-CM | POA: Insufficient documentation

## 2023-03-04 DIAGNOSIS — D508 Other iron deficiency anemias: Secondary | ICD-10-CM | POA: Insufficient documentation

## 2023-03-04 DIAGNOSIS — E785 Hyperlipidemia, unspecified: Secondary | ICD-10-CM

## 2023-03-04 DIAGNOSIS — Z1231 Encounter for screening mammogram for malignant neoplasm of breast: Secondary | ICD-10-CM

## 2023-03-04 DIAGNOSIS — F419 Anxiety disorder, unspecified: Secondary | ICD-10-CM | POA: Diagnosis not present

## 2023-03-04 DIAGNOSIS — R5383 Other fatigue: Secondary | ICD-10-CM | POA: Insufficient documentation

## 2023-03-04 DIAGNOSIS — R0609 Other forms of dyspnea: Secondary | ICD-10-CM | POA: Diagnosis present

## 2023-03-04 MED ORDER — BUPROPION HCL ER (SR) 150 MG PO TB12: 150.0000 mg | ORAL_TABLET | Freq: Two times a day (BID) | ORAL | 3 refills | Status: DC

## 2023-03-04 MED ORDER — ALPRAZOLAM 0.5 MG PO TABS: 0.5000 mg | ORAL_TABLET | Freq: Two times a day (BID) | ORAL | 2 refills | Status: DC | PRN

## 2023-03-04 NOTE — Assessment & Plan Note (Signed)
Zoloft is no longer working.  Bites her fingernails to the quick.  Will try Wellbutrin.  Giving her xanax for emergencies

## 2023-03-04 NOTE — Assessment & Plan Note (Signed)
Just had a b12 injection yesterday

## 2023-03-04 NOTE — Assessment & Plan Note (Signed)
Will get her set up for a mammogram

## 2023-03-04 NOTE — Progress Notes (Signed)
Established Patient Office Visit  Subjective   Patient ID: Connie Cameron, female    DOB: [birth year]  Age: 63 y.o. MRN: [id number]  Chief Complaint  Patient presents with   Establish Care   Medical Management of Chronic Issues    HPI Connie Cameron complains of fatigue today she can get short of breath just taking a shower she has to sit down and rest.  She gets very short of breath.  She cannot walk to the mailbox anymore because she does not have enough energy to get there.  She denies peripheral edema PND orthopnea.  She has fleeting chest pain from time to time.  She is scheduled for nuclear stress test with cardiology.  She had Single-vessel CAD of the RCA.  She had STEMI and placement of 2 drug-eluting stents in her RCA. Cardiac echo 01/2022 LVEF 60-65, normal LV function, NWMA and mild left ventricular hypertrophy, diastolic parameters were normal mild mitral valve regurgitation tricuspid aortic valve with mild sclerosis present.  No aortic stenosis. No longer undergoing chemotherapy for her squamous cell carcinoma at the base of the tongue.  She has a B12 deficiency and gets an injection monthly.  Last injection was yesterday.   ROS    Objective:     BP 120/79 (BP Location: Right Arm, Patient Position: Sitting, Cuff Size: Normal)   Pulse 85   Temp (!) 97.3 F (36.3 C) (Oral)   Resp 16   Ht 5\' 2"  (1.575 m) Comment: per patient  Wt 165 lb 3.2 oz (74.9 kg)   SpO2 95%   BMI 30.22 kg/m    Physical Exam Vitals and nursing note reviewed.  Constitutional:      Appearance: Normal appearance.  HENT:     Head: Normocephalic and atraumatic.  Eyes:     Conjunctiva/sclera: Conjunctivae normal.  Cardiovascular:     Rate and Rhythm: Normal rate and regular rhythm.  Pulmonary:     Effort: Pulmonary effort is normal.     Breath sounds: Normal breath sounds.  Musculoskeletal:     Right lower leg: No edema.     Left lower leg: No edema.  Skin:    General: Skin is warm and  dry.  Neurological:     Mental Status: She is alert and oriented to person, place, and time.  Psychiatric:        Mood and Affect: Mood normal.        Behavior: Behavior normal.        Thought Content: Thought content normal.        Judgment: Judgment normal.          No results found for any visits on 03/04/23.    The ASCVD Risk score (Arnett DK, et al., 2019) failed to calculate for the following reasons:   Risk score cannot be calculated because patient has a medical history suggesting prior/existing ASCVD    Assessment & Plan:  Encounter for screening mammogram for malignant neoplasm of breast Assessment & Plan: Will get her set up for a mammogram  Orders: -     3D Screening Mammogram, Left and Right; Future  Fatigue, unspecified type Assessment & Plan: Significant fatigue interfering with ADLs.  Checking CBC, CMP, thyroid function and will get chest x-ray  Orders: -     CBC with Differential/Platelet -     CMP14+EGFR -     TSH + free T4 -     Iron, TIBC and Ferritin Panel  Iron deficiency anemia secondary  to inadequate dietary iron intake Assessment & Plan: Has history of iron deficiency anemia  Orders: -     Iron, TIBC and Ferritin Panel  B12 deficiency Assessment & Plan: Just had a b12 injection yesterday     Shortness of breath Assessment & Plan: Checking her chest x-ray.  Orders: -     DG Chest 2 View; Future  Immunization due Assessment & Plan: Prevnar 20 today.  Is up-to-date on her flu vaccine. Gave her a card to get her RSV vaccine for the pharacy  Orders: -     Pneumococcal conjugate vaccine 20-valent     Return in about 3 months (around 06/02/2023).    Connie Medina, MD

## 2023-03-04 NOTE — Assessment & Plan Note (Signed)
Checking her chest x-ray.

## 2023-03-04 NOTE — Progress Notes (Signed)
Cardiology Office Note    Date:  03/04/2023   ID:  Connie Cameron, DOB [birth year], MRN [id number]  PCP:  Alease Medina, MD  Cardiologist:  Yvonne Kendall, MD  Electrophysiologist:  None   Chief Complaint: Follow-up  History of Present Illness:   Connie Cameron is a 63 y.o. female with history of CAD with inferior ST elevation MI on 01/23/2022, HLD, and head and neck cancer who presents for follow-up of CAD.   She was admitted to [facility] in 01/2022 with an inferior ST elevation MI.  LHC showed severe single-vessel CAD with 99% mid RCA stenosis felt to likely be due to acute plaque rupture.  There was also 50 to 60% proximal RCA stenosis as well as moderate to severe, but noncritical left coronary artery disease.  She underwent successful PCI/DES to the proximal and mid RCA using nonoverlapping drug-eluting stents.  There was a jailed RV marginal branch arising from the mid RCA that demonstrated 70% stenosis of pre-PCI that increased to 90% with plaque shift.  There was TIMI II flow at the end of the procedure.  The vessel was a small branch and too small for intervention.  The left coronary artery disease was favored for medical management as the most severe stenoses involved small/distal branches.  Echo during the admission demonstrated an EF of 60 to 65%, no regional wall motion abnormalities, mild LVH, normal LV diastolic function parameters, normal RV systolic function, mild mitral regurgitation, and aortic valve sclerosis without evidence of stenosis.  High-sensitivity troponin peaked at 3204.  Post intervention there was some NSVT that resolved prior to discharge.  She was seen in hospital follow-up in 02/2022 and was without symptoms of angina or cardiac decompensation.  She did note some mild positional dizziness with recommendation to decrease Toprol-XL to 12.5 mg daily.  Follow-up labs obtained in 04/2022 showed an improved LDL of 80 (prior 161 not on statin therapy).  Prescription for PCSK9  inhibitor was sent in.  She was seen in the office in 04/2022, noting some intermittently randomly occurring chest tightness and shortness of breath without frank chest pain.  She also noted some left-sided neck discomfort and headache.  She was started on Imdur 15 mg.  Limited echo in 05/2022 demonstrated an EF of 60 to 65%, no regional wall motion abnormalities, normal LV diastolic function parameters, normal RV systolic function and ventricular cavity size, no significant valvular abnormalities, and an estimated right atrial pressure of 3 mmHg.  Carotid artery ultrasound in 05/2022 showed no evidence of carotid artery stenosis with antegrade flow of the bilateral vertebral arteries and a normal flow hemodynamics in the bilateral subclavian arteries.  She was last seen in the office in 07/2022 and was without further randomly occurring chest tightness.  She did report an episode of palpitations that lasted for several minutes approximately 3 weeks prior to that appointment with spontaneous resolution and had been asymptomatic since.  She comes in today noting an approximate 1 month history of exertional shortness of breath with associated fatigue and sweating.  Symptoms will last for 30 minutes before improving with rest.  Symptoms do feel similar to what she was experiencing leading up to her MI, though are not as severe.  She also notices some intermittent randomly occurring sharp chest discomfort that feels like a bee sting.  No dizziness, presyncope, or syncope.  She remains adherent to cardiac medications.  No lower extremity swelling or progressive orthopnea.  No falls or symptoms concerning for  bleeding.   Labs independently reviewed: 11/2022 - TSH normal, Hgb 11.8, PLT 210, potassium 3.8, BUN 16, serum creatinine 1.03 07/2022 - TC 117, TG 99, HDL 45, LDL 52  Past Medical History:  Diagnosis Date   Anxiety    Depression    GERD (gastroesophageal reflux disease)    Throat cancer (HCC) 2021   Tongue  cancer (HCC)     Past Surgical History:  Procedure Laterality Date   COLONOSCOPY WITH PROPOFOL N/A 03/30/2020   Procedure: COLONOSCOPY WITH PROPOFOL;  Surgeon: Pasty Spillers, MD;  Location: [facility] ENDOSCOPY;  Service: Endoscopy;  Laterality: N/A;  COVID POSITIVE 02/29/2020   CORONARY/GRAFT ACUTE MI REVASCULARIZATION N/A 01/23/2022   Procedure: Coronary/Graft Acute MI Revascularization;  Surgeon: Yvonne Kendall, MD;  Location: [facility] INVASIVE CV LAB;  Service: Cardiovascular;  Laterality: N/A;   ESOPHAGOGASTRODUODENOSCOPY (EGD) WITH PROPOFOL N/A 03/30/2020   Procedure: ESOPHAGOGASTRODUODENOSCOPY (EGD) WITH PROPOFOL;  Surgeon: Pasty Spillers, MD;  Location: [facility] ENDOSCOPY;  Service: Endoscopy;  Laterality: N/A;   ESOPHAGOGASTRODUODENOSCOPY (EGD) WITH PROPOFOL N/A 08/03/2020   Procedure: ESOPHAGOGASTRODUODENOSCOPY (EGD) WITH PROPOFOL;  Surgeon: Pasty Spillers, MD;  Location: [facility] ENDOSCOPY;  Service: Endoscopy;  Laterality: N/A;   ESOPHAGOGASTRODUODENOSCOPY (EGD) WITH PROPOFOL N/A 01/25/2021   Procedure: ESOPHAGOGASTRODUODENOSCOPY (EGD) WITH PROPOFOL;  Surgeon: Pasty Spillers, MD;  Location: [facility] ENDOSCOPY;  Service: Endoscopy;  Laterality: N/A;   ESOPHAGOGASTRODUODENOSCOPY (EGD) WITH PROPOFOL N/A 09/05/2021   Procedure: ESOPHAGOGASTRODUODENOSCOPY (EGD) WITH PROPOFOL;  Surgeon: Toney Reil, MD;  Location: [facility] ENDOSCOPY;  Service: Gastroenterology;  Laterality: N/A;   EXCISION MASS NECK Left 01/18/2019   Procedure: EXCISION MASS NECK/NODE;  Surgeon: Vernie Murders, MD;  Location: [facility] ORS;  Service: ENT;  Laterality: Left;   IR REMOVAL TUN ACCESS W/ PORT W/O FL MOD SED  12/05/2021   LARYNGOSCOPY Bilateral 02/22/2019   Procedure: MICROSCOPIC DIRECT LARYNGOSCOPY AND BIOPSY;  Surgeon: Vernie Murders, MD;  Location: [facility] ORS;  Service: ENT;  Laterality: Bilateral;   LEFT HEART CATH AND CORONARY ANGIOGRAPHY N/A 01/23/2022   Procedure: LEFT HEART CATH AND CORONARY ANGIOGRAPHY;   Surgeon: Yvonne Kendall, MD;  Location: [facility] CV [facility];  Service: Cardiovascular;  Laterality: N/A;   PORTA CATH INSERTION N/A 03/24/2019   Procedure: PORTA CATH INSERTION;  Surgeon: Renford Dills, MD;  Location: [facility] CV LAB;  Service: Cardiovascular;  Laterality: N/A;   TONSILLECTOMY     TUBAL LIGATION      Current Medications: Current Meds  Medication Sig   aspirin 81 MG chewable tablet Chew 1 tablet (81 mg total) by mouth daily.   atorvastatin (LIPITOR) 80 MG tablet TAKE 1 TABLET BY MOUTH EVERY DAY   BRILINTA 90 MG TABS tablet TAKE 1 TABLET BY MOUTH 2 TIMES DAILY.   Docusate Sodium (DSS) 100 MG CAPS Take 1 capsule by mouth 2 (two) times daily. (Patient not taking: Reported on 03/04/2023)   gabapentin (NEURONTIN) 300 MG capsule Take 300 mg by mouth 2 (two) times daily.   hydrOXYzine (ATARAX/VISTARIL) 25 MG tablet Take 25 mg by mouth daily as needed for nausea, vomiting or anxiety.   isosorbide mononitrate (IMDUR) 30 MG 24 hr tablet TAKE 1/2 OF A TABLET (15 MG TOTAL) BY MOUTH DAILY   meclizine (ANTIVERT) 12.5 MG tablet Take 1 tablet (12.5 mg total) by mouth 3 (three) times daily as needed for dizziness or nausea.   methocarbamol (ROBAXIN) 500 MG tablet Take 500 mg by mouth at bedtime.   metoprolol succinate (TOPROL-XL) 25 MG 24 hr tablet TAKE 0.5  TABLETS BY MOUTH DAILY. TAKE WITH OR IMMEDIATELY FOLLOWING A MEAL.   pantoprazole (PROTONIX) 40 MG tablet Take 1 tablet (40 mg total) by mouth daily.   sertraline (ZOLOFT) 100 MG tablet Take 100 mg by mouth daily.   Vitamin D, Ergocalciferol, (DRISDOL) 1.25 MG (50000 UNIT) CAPS capsule Take 50,000 Units by mouth once a week.    Allergies:   Chlorhexidine   Social History   Socioeconomic History   Marital status: Single    Spouse name: Not on file   Number of children: Not on file   Years of education: Not on file   Highest education level: Not on file  Occupational History   Not on file  Tobacco Use   Smoking  status: Never   Smokeless tobacco: Never  Vaping Use   Vaping status: Never Used  Substance and Sexual Activity   Alcohol use: Not Currently   Drug use: No   Sexual activity: Not on file  Other Topics Concern   Not on file  Social History Narrative   Not on file   Social Drivers of Health   Financial Resource Strain: Not on file  Food Insecurity: No Food Insecurity (03/04/2023)   Hunger Vital Sign    Worried About Running Out of Food in the Last Year: Never true    Ran Out of Food in the Last Year: Never true  Transportation Needs: No Transportation Needs (03/04/2023)   PRAPARE - Administrator, Civil Service (Medical): No    Lack of Transportation (Non-Medical): No  Physical Activity: Not on file  Stress: Not on file  Social Connections: Not on file     Family History:  The patient's family history includes Brain cancer in her paternal grandfather; Breast cancer in her sister.  ROS:   12-point review of systems is negative unless otherwise noted in the HPI.   EKGs/Labs/Other Studies Reviewed:    Studies reviewed were summarized above. The additional studies were reviewed today:  2D echo 01/23/2022: 1. Left ventricular ejection fraction, by estimation, is 60 to 65%. The  left ventricle has normal function. The left ventricle has no regional  wall motion abnormalities. There is mild left ventricular hypertrophy.  Left ventricular diastolic parameters  were normal.   2. Right ventricular systolic function is normal. The right ventricular  size is not well visualized.   3. The mitral valve is normal in structure. Mild mitral valve  regurgitation.   4. The aortic valve is tricuspid. Aortic valve regurgitation is not  visualized. Aortic valve sclerosis is present, with no evidence of aortic  valve stenosis.  __________   LHC 01/23/2022: Conclusions: Severe single-vessel coronary artery disease with 99% mid RCA stenosis, likely due to acute plaque rupture,  with TIMI-2 flow.  There is also a hazy 50-60% proximal RCA stenosis as well as moderate to severe but not critical left coronary artery disease as detailed below. Normal left ventricular filling pressure (LVEDP 12 mmHg). Successful PCI to proximal and mid RCA stenoses using nonoverlapping Onyx Frontier 3.0 x 12 mm (proximal) and 2.75 x 18 mm (mid) drug-eluting stents with 0% residual stenosis and TIMI-3 flow.  Jailed RV marginal branch arising from the mid RCA demonstrates 70% stenosis pre-PCI, increased to 90% plaque shift.  There is TIMI-2 flow at the end of the procedure through this small branch, which is too small for intervention.   Recommendations: Admit to ICU for post STEMI/PCI monitoring. Dual antiplatelet therapy with aspirin and ticagrelor for at  least 12 months. Aggressive secondary prevention of coronary artery disease. Obtain echocardiogram. Favor medical therapy of the left coronary artery disease, as the most severe stenoses involve small/distal branches. __________  Limited echo 05/29/2022: 1. Left ventricular ejection fraction, by estimation, is 60 to 65%. The  left ventricle has normal function. The left ventricle has no regional  wall motion abnormalities. Left ventricular diastolic parameters were  normal. The average left ventricular  global longitudinal strain is -17.9 %. The global longitudinal strain is  normal.   2. Right ventricular systolic function is normal. The right ventricular  size is normal.   3. The mitral valve is normal in structure. No evidence of mitral valve  regurgitation.   4. The aortic valve is tricuspid. Aortic valve regurgitation is not  visualized.   5. The inferior vena cava is normal in size with greater than 50%  respiratory variability, suggesting right atrial pressure of 3 mmHg.   Comparison(s): 01/23/22 60-65%.  __________  Carotid artery ultrasound 05/29/2022: Summary:  Right Carotid: There was no evidence of thrombus,  dissection,  atherosclerotic plaque or stenosis in the cervical carotid system.   Left Carotid: There was no evidence of thrombus, dissection,  atherosclerotic plaque or stenosis in the cervical carotid system.   Vertebrals:  Bilateral vertebral arteries demonstrate antegrade flow.  Subclavians: Normal flow hemodynamics were seen in bilateral subclavian arteries.    EKG:  EKG is ordered today.  The EKG ordered today demonstrates NSR, 70 bpm, rare PAC, low voltage QRS, prior inferior infarct, prior inferior infarct versus lead placement, consistent with prior tracings  Recent Labs: 11/29/2022: ALT 20; BUN 16; Creatinine 1.03; Hemoglobin 11.8; Platelet Count 210; Potassium 3.8; Sodium 140; TSH 2.010  Recent Lipid Panel    Component Value Date/Time   CHOL 117 07/19/2022 1109   TRIG 99 07/19/2022 1109   HDL 45 07/19/2022 1109   CHOLHDL 2.6 07/19/2022 1109   VLDL 20 07/19/2022 1109   LDLCALC 52 07/19/2022 1109    PHYSICAL EXAM:    VS:  BP 104/76 (BP Location: Left Arm, Patient Position: Sitting, Cuff Size: Normal)   Pulse 78   Ht 5\' 2"  (1.575 m)   Wt 162 lb (73.5 kg)   SpO2 98%   BMI 29.63 kg/m   BMI: Body mass index is 29.63 kg/m.  Physical Exam Vitals reviewed.  Constitutional:      Appearance: She is well-developed.  HENT:     Head: Normocephalic and atraumatic.  Eyes:     General:        Right eye: No discharge.        Left eye: No discharge.  Neck:     Vascular: No JVD.  Cardiovascular:     Rate and Rhythm: Normal rate and regular rhythm.     Heart sounds: Normal heart sounds, S1 normal and S2 normal. Heart sounds not distant. No midsystolic click and no opening snap. No murmur heard.    No friction rub.  Pulmonary:     Effort: Pulmonary effort is normal. No respiratory distress.     Breath sounds: Normal breath sounds. No decreased breath sounds, wheezing, rhonchi or rales.  Chest:     Chest wall: No tenderness.  Abdominal:     General: There is no  distension.  Musculoskeletal:     Cervical back: Normal range of motion.     Right lower leg: No edema.     Left lower leg: No edema.  Skin:    General: Skin is  warm and dry.     Nails: There is no clubbing.  Neurological:     Mental Status: She is alert and oriented to person, place, and time.  Psychiatric:        Speech: Speech normal.        Behavior: Behavior normal.        Thought Content: Thought content normal.        Judgment: Judgment normal.     Wt Readings from Last 3 Encounters:  03/04/23 165 lb 3.2 oz (74.9 kg)  03/04/23 162 lb (73.5 kg)  11/29/22 164 lb (74.4 kg)     ASSESSMENT & PLAN:   CAD in the native coronary arteries with recent inferior ST elevation MI with exertional dyspnea concerning for anginal equivalent: Currently without symptoms of angina or cardiac decompensation.  Schedule myocardial PET/CT to evaluate for high risk ischemia.  Continue DAPT with aspirin 81 mg and ticagrelor 90 mg twice daily for now.  She will otherwise continue atorvastatin 80 mg, Praluent, Toprol-XL 12.5 mg, and Imdur 15 mg.  NSVT: Quiescent.  Likely in the setting of acute MI.   Remains on Toprol-XL as above.  HLD: LDL 52 in 07/2022.  She remains on atorvastatin 80 mg and Praluent.   Informed Consent   Shared Decision Making/Informed Consent{  The risks [chest pain, shortness of breath, cardiac arrhythmias, dizziness, blood pressure fluctuations, myocardial infarction, stroke/transient ischemic attack, nausea, vomiting, allergic reaction, radiation exposure, metallic taste sensation and life-threatening complications (estimated to be 1 in 10,000)], benefits (risk stratification, diagnosing coronary artery disease, treatment guidance) and alternatives of a cardiac PET stress test were discussed in detail with Ms. Spiegelman and she agrees to proceed.        Disposition: F/u with Dr. Okey Dupre or an APP in 1 month.   Medication Adjustments/Labs and Tests Ordered: Current medicines are  reviewed at length with the patient today.  Concerns regarding medicines are outlined above. Medication changes, Labs and Tests ordered today are summarized above and listed in the Patient Instructions accessible in Encounters.   Signed, Eula Listen, PA-C 03/04/2023 1:54 PM      [facility] [place] [address] [place], [place] [address] [phone]

## 2023-03-04 NOTE — Assessment & Plan Note (Signed)
Prevnar 20 today.  Is up-to-date on her flu vaccine. Gave her a card to get her RSV vaccine for the pharacy

## 2023-03-04 NOTE — Assessment & Plan Note (Signed)
Has history of iron deficiency anemia

## 2023-03-04 NOTE — Patient Instructions (Addendum)
Medication Instructions:  - Your physician recommends that you continue on your current medications as directed. Please refer to the Current Medication list given to you today.  *If you need a refill on your cardiac medications before your next appointment, please call your pharmacy*   Lab Work: - none ordered  If you have labs (blood work) drawn today and your tests are completely normal, you will receive your results only by: MyChart Message (if you have MyChart) OR A paper copy in the mail If you have any lab test that is abnormal or we need to change your treatment, we will call you to review the results.   Testing/Procedures:  1) Myocardial PET Stress Test: CARDIAC PET- Your physician has requested that you have a Cardiac Pet Stress Test.   This testing is completed at [facility] ([address] [place], [place] [address]) or [facility] ([address] [place], [place]). Please arrive 30 minutes prior to your scheduled time.  The schedulers will call you to get this scheduled. Please follow further testing instructions below.     Please report to Radiology at the [facility] Main Entrance 30 minutes early for your test.  [address] [place], [place] [address]                         OR   Please report to the [facility], 30 mins prior to your test.  [address]  [place], [place]  How to Prepare for Your Cardiac PET/CT Stress Test:  Nothing to eat or drink, except water, 3 hours prior to arrival time.  NO caffeine/decaffeinated products, or chocolate 12 hours prior to arrival. (Please note decaffeinated beverages (teas/coffees) still contain caffeine).  If you have caffeine within 12 hours prior, the test will need to be rescheduled.  Medication instructions:  Do not take nitrates (isosorbide mononitrate, Ranexa) the day before or day of test   You may take your remaining  medications with water.  NO perfume, cologne or lotion on chest or abdomen area. FEMALES - Please avoid wearing dresses to this appointment.  Total time is 1 to 2 hours; you may want to bring reading material for the waiting time.  IF YOU THINK YOU MAY BE PREGNANT, OR ARE NURSING PLEASE INFORM THE TECHNOLOGIST.  In preparation for your appointment, medication and supplies will be purchased.  Appointment availability is limited, so if you need to cancel or reschedule, please call the Radiology Department Scheduler at [phone] 24 hours in advance to avoid a cancellation fee of $100.00  What to Expect When you Arrive:  Once you arrive and check in for your appointment, you will be taken to a preparation room within the Radiology Department.  A technologist or Nurse will obtain your medical history, verify that you are correctly prepped for the exam, and explain the procedure.  Afterwards, an IV will be started in your arm and electrodes will be placed on your skin for EKG monitoring during the stress portion of the exam. Then you will be escorted to the PET/CT scanner.  There, staff will get you positioned on the scanner and obtain a blood pressure and EKG.  During the exam, you will continue to be connected to the EKG and blood pressure machines.  A small, safe amount of a radioactive tracer will be injected in your IV to obtain a series of pictures of  your heart along with an injection of a stress agent.    After your Exam:  It is recommended that you eat a meal and drink a caffeinated beverage to counter act any effects of the stress agent.  Drink plenty of fluids for the remainder of the day and urinate frequently for the first couple of hours after the exam.  Your doctor will inform you of your test results within 7-10 business days.  For more information and frequently asked questions, please visit our website: [email or web address]  For questions about your test or how to  prepare for your test, please call: Cardiac Imaging Nurse Navigators Office: [phone]    Follow-Up: At [facility], you and your health needs are our priority.  As part of our continuing mission to provide you with exceptional heart care, we have created designated Provider Care Teams.  These Care Teams include your primary Cardiologist (physician) and Advanced Practice Providers (APPs -  Physician Assistants and Nurse Practitioners) who all work together to provide you with the care you need, when you need it.  We recommend signing up for the patient portal called "MyChart".  Sign up information is provided on this After Visit Summary.  MyChart is used to connect with patients for Virtual Visits (Telemedicine).  Patients are able to view lab/test results, encounter notes, upcoming appointments, etc.  Non-urgent messages can be sent to your provider as well.   To learn more about what you can do with MyChart, go to [email or web address].    Your next appointment:   1 month(s)  Provider:   You may see Yvonne Kendall, MD or one of the following Advanced Practice Providers on your designated Care Team:    Eula Listen, PA-C     Other Instructions N/a

## 2023-03-04 NOTE — Assessment & Plan Note (Signed)
Significant fatigue interfering with ADLs.  Checking CBC, CMP, thyroid function and will get chest x-ray

## 2023-03-05 ENCOUNTER — Telehealth (HOSPITAL_COMMUNITY): Payer: Self-pay | Admitting: *Deleted

## 2023-03-05 ENCOUNTER — Other Ambulatory Visit: Payer: Self-pay | Admitting: Family Medicine

## 2023-03-05 ENCOUNTER — Encounter: Payer: Self-pay | Admitting: Family Medicine

## 2023-03-05 LAB — CBC WITH DIFFERENTIAL/PLATELET
Basophils Absolute: 0 10*3/uL (ref 0.0–0.2)
Basos: 1 %
EOS (ABSOLUTE): 0.1 10*3/uL (ref 0.0–0.4)
Eos: 2 %
Hematocrit: 35.7 % (ref 34.0–46.6)
Hemoglobin: 11.7 g/dL (ref 11.1–15.9)
Immature Grans (Abs): 0 10*3/uL (ref 0.0–0.1)
Immature Granulocytes: 0 %
Lymphocytes Absolute: 1.2 10*3/uL (ref 0.7–3.1)
Lymphs: 21 %
MCH: 29.3 pg (ref 26.6–33.0)
MCHC: 32.8 g/dL (ref 31.5–35.7)
MCV: 89 fL (ref 79–97)
Monocytes Absolute: 0.4 10*3/uL (ref 0.1–0.9)
Monocytes: 7 %
Neutrophils Absolute: 3.9 10*3/uL (ref 1.4–7.0)
Neutrophils: 69 %
Platelets: 228 10*3/uL (ref 150–450)
RBC: 4 x10E6/uL (ref 3.77–5.28)
RDW: 13.4 % (ref 11.7–15.4)
WBC: 5.7 10*3/uL (ref 3.4–10.8)

## 2023-03-05 LAB — CMP14+EGFR
ALT: 18 [IU]/L (ref 0–32)
AST: 23 [IU]/L (ref 0–40)
Albumin: 4.3 g/dL (ref 3.9–4.9)
Alkaline Phosphatase: 118 [IU]/L (ref 44–121)
BUN/Creatinine Ratio: 17 (ref 12–28)
BUN: 18 mg/dL (ref 8–27)
Bilirubin Total: 0.4 mg/dL (ref 0.0–1.2)
CO2: 23 mmol/L (ref 20–29)
Calcium: 9.5 mg/dL (ref 8.7–10.3)
Chloride: 106 mmol/L (ref 96–106)
Creatinine, Ser: 1.09 mg/dL — ABNORMAL HIGH (ref 0.57–1.00)
Globulin, Total: 2.1 g/dL (ref 1.5–4.5)
Glucose: 98 mg/dL (ref 70–99)
Potassium: 4.1 mmol/L (ref 3.5–5.2)
Sodium: 143 mmol/L (ref 134–144)
Total Protein: 6.4 g/dL (ref 6.0–8.5)
eGFR: 57 mL/min/{1.73_m2} — ABNORMAL LOW (ref >59)

## 2023-03-05 LAB — IRON,TIBC AND FERRITIN PANEL
Ferritin: 156 ng/mL — ABNORMAL HIGH (ref 15–150)
Iron Saturation: 23 % (ref 15–55)
Iron: 59 ug/dL (ref 27–139)
Total Iron Binding Capacity: 258 ug/dL (ref 250–450)
UIBC: 199 ug/dL (ref 118–369)

## 2023-03-05 LAB — TSH+FREE T4
Free T4: 0.73 ng/dL — ABNORMAL LOW (ref 0.82–1.77)
TSH: 3.88 u[IU]/mL (ref 0.450–4.500)

## 2023-03-05 NOTE — Telephone Encounter (Signed)
Reaching out to patient to offer assistance regarding upcoming cardiac imaging study; pt verbalizes understanding of appt date/time, parking situation and where to check in, pre-test NPO status; name and call back number provided for further questions should they arise  Larey Brick RN Navigator Cardiac Imaging [facility] [phone] office [phone] cell  Patient aware to avoid Imdur and caffeine prior to her cardiac PET study.

## 2023-03-05 NOTE — Telephone Encounter (Signed)
Copied from CRM [id number]. Topic: Clinical - Medication Refill >> Mar 05, 2023  1:25 PM Mosetta Putt H wrote: Most Recent Primary Care Visit:   Medication: gabapentin (NEURONTIN) 300 MG capsule   Has the patient contacted their pharmacy? No (Agent: If no, request that the patient contact the pharmacy for the refill. If patient does not wish to contact the pharmacy document the reason why and proceed with request.) (Agent: If yes, when and what did the pharmacy advise?)  Is this the correct pharmacy for this prescription? Yes If no, delete pharmacy and type the correct one.  This is the patient's preferred pharmacy:  [facility] [address], [place] - [address] [place] [address] Phone: [phone] Fax: [phone]   Has the prescription been filled recently? No  Is the patient out of the medication? No  Has the patient been seen for an appointment in the last year OR does the patient have an upcoming appointment? Yes  Can we respond through MyChart? Yes  Agent: Please be advised that Rx refills may take up to 3 business days. We ask that you follow-up with your pharmacy.

## 2023-03-06 ENCOUNTER — Encounter
Admission: RE | Admit: 2023-03-06 | Discharge: 2023-03-06 | Disposition: A | Discharge status: Home / Self Care | Payer: Medicare Other | Source: Ambulatory Visit | Attending: Physician Assistant | Admitting: Physician Assistant

## 2023-03-06 DIAGNOSIS — R0609 Other forms of dyspnea: Secondary | ICD-10-CM

## 2023-03-06 DIAGNOSIS — I2089 Other forms of angina pectoris: Secondary | ICD-10-CM | POA: Diagnosis not present

## 2023-03-06 MED ORDER — RUBIDIUM RB82 GENERATOR (RUBYFILL)
25.0000 | PACK | Freq: Once | INTRAVENOUS | Status: AC
  Administered 2023-03-06: 19.34 via INTRAVENOUS

## 2023-03-06 MED ORDER — GABAPENTIN 300 MG PO CAPS: 300.0000 mg | ORAL_CAPSULE | Freq: Two times a day (BID) | ORAL | 3 refills | Status: DC

## 2023-03-06 MED ORDER — REGADENOSON 0.4 MG/5ML IV SOLN
INTRAVENOUS | Status: AC
  Filled 2023-03-06: qty 5

## 2023-03-06 MED ORDER — REGADENOSON 0.4 MG/5ML IV SOLN
0.4000 mg | Freq: Once | INTRAVENOUS | Status: AC
  Administered 2023-03-06: 0.4 mg via INTRAVENOUS
  Filled 2023-03-06: qty 5

## 2023-03-06 MED ORDER — RUBIDIUM RB82 GENERATOR (RUBYFILL)
25.0000 | PACK | Freq: Once | INTRAVENOUS | Status: AC
  Administered 2023-03-06: 19.36 via INTRAVENOUS

## 2023-03-06 NOTE — Progress Notes (Signed)
Patient presents for a cardiac PET stress test and tolerated procedure without incident. Patient maintained acceptable vital signs throughout the test and was offered caffeine after test.  Patient ambulated out of department with a steady gait.

## 2023-03-07 LAB — NM PET CT CARDIAC PERFUSION MULTI W/ABSOLUTE BLOODFLOW
LV dias vol: 57 mL (ref 46–106)
MBFR: 2.94
Nuc Rest EF: 61 %
Nuc Stress EF: 79 %
Peak HR: 93 {beats}/min
Rest HR: 62 {beats}/min
Rest MBF: 0.71 ml/g/min
Rest Nuclear Isotope Dose: 19.3 mCi
Rest perfusion cavity size (mL): 57 mL
SRS: 0
SSS: 0
ST Depression (mm): 0 mm
Stress MBF: 2.09 ml/g/min
Stress Nuclear Isotope Dose: 19.4 mCi
Stress perfusion cavity size (mL): 68 mL
TID: 0.96

## 2023-03-23 ENCOUNTER — Other Ambulatory Visit: Payer: Self-pay | Admitting: Internal Medicine

## 2023-03-27 ENCOUNTER — Ambulatory Visit: Payer: Medicare Other | Admitting: Internal Medicine

## 2023-04-01 ENCOUNTER — Inpatient Hospital Stay: Payer: Medicare Other | Attending: Internal Medicine

## 2023-04-10 ENCOUNTER — Other Ambulatory Visit: Payer: Medicare Other

## 2023-04-20 NOTE — Progress Notes (Unsigned)
 Cardiology Office Note    Date:  04/22/2023   ID:  Connie Cameron, DOB 06/21/1960, MRN [id number]  PCP:  Alease Medina, MD  Cardiologist:  Yvonne Kendall, MD  Electrophysiologist:  None   Chief Complaint: Follow-up  History of Present Illness:   Connie Cameron is a 63 y.o. female with history of CAD with inferior ST elevation MI on 01/23/2022, HLD, and head and neck cancer who presents for follow-up of myocardial PET/CT.  She was admitted to [facility] in 01/2022 with an inferior ST elevation MI.  LHC showed severe single-vessel CAD with 99% mid RCA stenosis felt to likely be due to acute plaque rupture.  There was also 50 to 60% proximal RCA stenosis as well as moderate to severe, but noncritical left coronary artery disease.  She underwent successful PCI/DES to the proximal and mid RCA using nonoverlapping drug-eluting stents.  There was a jailed RV marginal branch arising from the mid RCA that demonstrated 70% stenosis of pre-PCI that increased to 90% with plaque shift.  There was TIMI II flow at the end of the procedure.  The vessel was a small branch and too small for intervention.  The left coronary artery disease was favored for medical management as the most severe stenoses involved small/distal branches.  Echo during the admission demonstrated an EF of 60 to 65%, no regional wall motion abnormalities, mild LVH, normal LV diastolic function parameters, normal RV systolic function, mild mitral regurgitation, and aortic valve sclerosis without evidence of stenosis.  High-sensitivity troponin peaked at 3204.  Post intervention there was some NSVT that resolved prior to discharge.  She was seen in hospital follow-up in 02/2022 and was without symptoms of angina or cardiac decompensation.  She did note some mild positional dizziness with recommendation to decrease Toprol-XL to 12.5 mg daily.  Follow-up labs obtained in 04/2022 showed an improved LDL of 80 (prior 161 not on statin therapy).   Prescription for PCSK9 inhibitor was sent in.  She was seen in the office in 04/2022, noting some intermittently randomly occurring chest tightness and shortness of breath without frank chest pain.  She also noted some left-sided neck discomfort and headache.  She was started on Imdur 15 mg.  Limited echo in 05/2022 demonstrated an EF of 60 to 65%, no regional wall motion abnormalities, normal LV diastolic function parameters, normal RV systolic function and ventricular cavity size, no significant valvular abnormalities, and an estimated right atrial pressure of 3 mmHg.  Carotid artery ultrasound in 05/2022 showed no evidence of carotid artery stenosis with antegrade flow of the bilateral vertebral arteries and a normal flow hemodynamics in the bilateral subclavian arteries.  She was last seen in the office in 02/2023 and noted an approximate 1 month history of exertional shortness of breath with associated fatigue and sweating.  Symptoms were similar, though not as severe as what she experienced leading up to her MI.  Subsequent myocardial PET/CT on 03/06/2023 showed no evidence of ischemia or infarction with preserved LV systolic function at rest and stress, and was overall low risk/normal.  She comes in doing well from a cardiac perspective and is without symptoms of angina or cardiac decompensation.  Previously noted chest discomfort has improved.  She does note some intermittent dysphagia and a split-second epigastric discomfort.  She reports having previously undergone EGD with esophageal dilatation though has not followed up with GI recently.  No palpitations, dyspnea, dizziness, presyncope, or syncope.  No falls, hematochezia, or melena.  Labs independently reviewed: 02/2023 - TSH normal, BUN 18, serum creatinine 1.09, potassium 4.1, albumin 4.3, AST/ALT normal, Hgb 11.7, PLT 228 07/2022 - TC 117, TG 99, HDL 45, LDL 52   Past Medical History:  Diagnosis Date   Anxiety    Depression    GERD  (gastroesophageal reflux disease)    Throat cancer (HCC) 2021   Tongue cancer (HCC)     Past Surgical History:  Procedure Laterality Date   COLONOSCOPY WITH PROPOFOL N/A 03/30/2020   Procedure: COLONOSCOPY WITH PROPOFOL;  Surgeon: Pasty Spillers, MD;  Location: [facility] ENDOSCOPY;  Service: Endoscopy;  Laterality: N/A;  COVID POSITIVE 02/29/2020   CORONARY/GRAFT ACUTE MI REVASCULARIZATION N/A 01/23/2022   Procedure: Coronary/Graft Acute MI Revascularization;  Surgeon: Yvonne Kendall, MD;  Location: [facility] IN[facility]IVE CV [facility];  Service: Cardiovascular;  Laterality: N/A;   ESOPHAGOGASTRODUODENOSCOPY (EGD) WITH PROPOFOL N/A 03/30/2020   Procedure: ESOPHAGOGASTRODUODENOSCOPY (EGD) WITH PROPOFOL;  Surgeon: Pasty Spillers, MD;  Location: [facility] ENDOSCOPY;  Service: Endoscopy;  Laterality: N/A;   ESOPHAGOGASTRODUODENOSCOPY (EGD) WITH PROPOFOL N/A 08/03/2020   Procedure: ESOPHAGOGASTRODUODENOSCOPY (EGD) WITH PROPOFOL;  Surgeon: Pasty Spillers, MD;  Location: [facility] ENDOSCOPY;  Service: Endoscopy;  Laterality: N/A;   ESOPHAGOGASTRODUODENOSCOPY (EGD) WITH PROPOFOL N/A 01/25/2021   Procedure: ESOPHAGOGASTRODUODENOSCOPY (EGD) WITH PROPOFOL;  Surgeon: Pasty Spillers, MD;  Location: [facility] ENDOSCOPY;  Service: Endoscopy;  Laterality: N/A;   ESOPHAGOGASTRODUODENOSCOPY (EGD) WITH PROPOFOL N/A 09/05/2021   Procedure: ESOPHAGOGASTRODUODENOSCOPY (EGD) WITH PROPOFOL;  Surgeon: Toney Reil, MD;  Location: [facility] ENDOSCOPY;  Service: Gastroenterology;  Laterality: N/A;   EXCISION MASS NECK Left 01/18/2019   Procedure: EXCISION MASS NECK/NODE;  Surgeon: Vernie Murders, MD;  Location: [facility] ORS;  Service: ENT;  Laterality: Left;   IR REMOVAL TUN ACCESS W/ PORT W/O FL MOD SED  12/05/2021   LARYNGOSCOPY Bilateral 02/22/2019   Procedure: MICROSCOPIC DIRECT LARYNGOSCOPY AND BIOPSY;  Surgeon: Vernie Murders, MD;  Location: [facility] ORS;  Service: ENT;  Laterality: Bilateral;   LEFT HEART CATH AND CORONARY ANGIOGRAPHY  N/A 01/23/2022   Procedure: LEFT HEART CATH AND CORONARY ANGIOGRAPHY;  Surgeon: Yvonne Kendall, MD;  Location: [facility];  Service: Cardiovascular;  Laterality: N/A;   PORTA CATH INSERTION N/A 03/24/2019   Procedure: PORTA CATH INSERTION;  Surgeon: Renford Dills, MD;  Location: [facility];  Service: Cardiovascular;  Laterality: N/A;   TONSILLECTOMY     TUBAL LIGATION      Current Medications: Current Meds  Medication Sig   ALPRAZolam (XANAX) 0.5 MG tablet Take 1 tablet (0.5 mg total) by mouth 2 (two) times daily as needed for anxiety.   aspirin 81 MG chewable tablet Chew 1 tablet (81 mg total) by mouth daily.   atorvastatin (LIPITOR) 80 MG tablet TAKE 1 TABLET BY MOUTH EVERY DAY   BRILINTA 90 MG TABS tablet TAKE 1 TABLET BY MOUTH 2 TIMES DAILY.   buPROPion (WELLBUTRIN SR) 150 MG 12 hr tablet Take 1 tablet (150 mg total) by mouth 2 (two) times daily.   Docusate Sodium (DSS) 100 MG CAPS Take 1 capsule by mouth every other day.   gabapentin (NEURONTIN) 300 MG capsule Take 1 capsule (300 mg total) by mouth 2 (two) times daily.   hydrOXYzine (ATARAX/VISTARIL) 25 MG tablet Take 25 mg by mouth daily as needed for nausea, vomiting or anxiety.   isosorbide mononitrate (IMDUR) 30 MG 24 hr tablet TAKE 1/2 OF A TABLET (15 MG TOTAL) BY MOUTH DAILY   meclizine (ANTIVERT) 12.5 MG tablet Take  1 tablet (12.5 mg total) by mouth 3 (three) times daily as needed for dizziness or nausea.   methocarbamol (ROBAXIN) 500 MG tablet Take 500 mg by mouth at bedtime.   metoprolol succinate (TOPROL-XL) 25 MG 24 hr tablet TAKE 0.5 TABLETS BY MOUTH DAILY. TAKE WITH OR IMMEDIATELY FOLLOWING A MEAL.   pantoprazole (PROTONIX) 40 MG tablet Take 1 tablet (40 mg total) by mouth daily.   sertraline (ZOLOFT) 100 MG tablet Take 100 mg by mouth daily.   Vitamin D, Ergocalciferol, (DRISDOL) 1.25 MG (50000 UNIT) CAPS capsule Take 50,000 Units by mouth once a week.    Allergies:   Chlorhexidine   Social  History   Socioeconomic History   Marital status: Single    Spouse name: Not on file   Number of children: Not on file   Years of education: Not on file   Highest education level: Not on file  Occupational History   Not on file  Tobacco Use   Smoking status: Never   Smokeless tobacco: Never  Vaping Use   Vaping status: Never Used  Substance and Sexual Activity   Alcohol use: Not Currently   Drug use: No   Sexual activity: Not on file  Other Topics Concern   Not on file  Social History Narrative   Not on file   Social Drivers of Health   Financial Resource Strain: Not on file  Food Insecurity: No Food Insecurity (03/04/2023)   Hunger Vital Sign    Worried About Running Out of Food in the Last Year: Never true    Ran Out of Food in the Last Year: Never true  Transportation Needs: No Transportation Needs (03/04/2023)   PRAPARE - Administrator, Civil Service (Medical): No    Lack of Transportation (Non-Medical): No  Physical Activity: Not on file  Stress: Not on file  Social Connections: Not on file     Family History:  The patient's family history includes Brain cancer in her paternal grandfather; Breast cancer in her sister.  ROS:   12-point review of systems is negative unless otherwise noted in the HPI.   EKGs/Labs/Other Studies Reviewed:    Studies reviewed were summarized above. The additional studies were reviewed today:  2D echo 01/23/2022: 1. Left ventricular ejection fraction, by estimation, is 60 to 65%. The  left ventricle has normal function. The left ventricle has no regional  wall motion abnormalities. There is mild left ventricular hypertrophy.  Left ventricular diastolic parameters  were normal.   2. Right ventricular systolic function is normal. The right ventricular  size is not well visualized.   3. The mitral valve is normal in structure. Mild mitral valve  regurgitation.   4. The aortic valve is tricuspid. Aortic valve  regurgitation is not  visualized. Aortic valve sclerosis is present, with no evidence of aortic  valve stenosis.  __________   LHC 01/23/2022: Conclusions: Severe single-vessel coronary artery disease with 99% mid RCA stenosis, likely due to acute plaque rupture, with TIMI-2 flow.  There is also a hazy 50-60% proximal RCA stenosis as well as moderate to severe but not critical left coronary artery disease as detailed below. Normal left ventricular filling pressure (LVEDP 12 mmHg). Successful PCI to proximal and mid RCA stenoses using nonoverlapping Onyx Frontier 3.0 x 12 mm (proximal) and 2.75 x 18 mm (mid) drug-eluting stents with 0% residual stenosis and TIMI-3 flow.  Jailed RV marginal branch arising from the mid RCA demonstrates 70% stenosis pre-PCI, increased to  90% plaque shift.  There is TIMI-2 flow at the end of the procedure through this small branch, which is too small for intervention.   Recommendations: Admit to ICU for post STEMI/PCI monitoring. Dual antiplatelet therapy with aspirin and ticagrelor for at least 12 months. Aggressive secondary prevention of coronary artery disease. Obtain echocardiogram. Favor medical therapy of the left coronary artery disease, as the most severe stenoses involve small/distal branches. __________   Limited echo 05/29/2022: 1. Left ventricular ejection fraction, by estimation, is 60 to 65%. The  left ventricle has normal function. The left ventricle has no regional  wall motion abnormalities. Left ventricular diastolic parameters were  normal. The average left ventricular  global longitudinal strain is -17.9 %. The global longitudinal strain is  normal.   2. Right ventricular systolic function is normal. The right ventricular  size is normal.   3. The mitral valve is normal in structure. No evidence of mitral valve  regurgitation.   4. The aortic valve is tricuspid. Aortic valve regurgitation is not  visualized.   5. The inferior vena cava  is normal in size with greater than 50%  respiratory variability, suggesting right atrial pressure of 3 mmHg.   Comparison(s): 01/23/22 60-65%.  __________   Carotid artery ultrasound 05/29/2022: Summary:  Right Carotid: There was no evidence of thrombus, dissection,  atherosclerotic plaque or stenosis in the cervical carotid system.   Left Carotid: There was no evidence of thrombus, dissection,  atherosclerotic plaque or stenosis in the cervical carotid system.   Vertebrals:  Bilateral vertebral arteries demonstrate antegrade flow.  Subclavians: Normal flow hemodynamics were seen in bilateral subclavian arteries. __________  Myocardial PET/CT 03/06/2023:   LV perfusion is normal. There is no evidence of ischemia. There is no evidence of infarction.   Rest left ventricular function is normal. Rest EF: 61%. Stress left ventricular function is normal. Stress EF: 79%. End diastolic cavity size is normal.   Myocardial blood flow was computed to be 0.25ml/g/min at rest and 2.6ml/g/min at stress. Global myocardial blood flow reserve was 2.94 and was normal.   Coronary calcium assessment not performed due to prior revascularization.   The study is normal. The study is low risk.    EKG:  EKG is not ordered today.    Recent Labs: 03/04/2023: ALT 18; BUN 18; Creatinine, Ser 1.09; Hemoglobin 11.7; Platelets 228; Potassium 4.1; Sodium 143; TSH 3.880  Recent Lipid Panel    Component Value Date/Time   CHOL 117 07/19/2022 1109   TRIG 99 07/19/2022 1109   HDL 45 07/19/2022 1109   CHOLHDL 2.6 07/19/2022 1109   VLDL 20 07/19/2022 1109   LDLCALC 52 07/19/2022 1109    PHYSICAL EXAM:    VS:  BP 108/78 (BP Location: Left Arm)   Pulse 87   Ht 5\' 2"  (1.575 m)   Wt 172 lb (78 kg)   SpO2 98%   BMI 31.46 kg/m   BMI: Body mass index is 31.46 kg/m.  Physical Exam Vitals reviewed.  Constitutional:      Appearance: She is well-developed.  HENT:     Head: Normocephalic and atraumatic.  Eyes:      General:        Right eye: No discharge.        Left eye: No discharge.  Cardiovascular:     Rate and Rhythm: Normal rate and regular rhythm.     Heart sounds: Normal heart sounds, S1 normal and S2 normal. Heart sounds not distant. No midsystolic click  and no opening snap. No murmur heard.    No friction rub.  Pulmonary:     Effort: Pulmonary effort is normal. No respiratory distress.     Breath sounds: Normal breath sounds. No decreased breath sounds, wheezing, rhonchi or rales.  Chest:     Chest wall: No tenderness.  Musculoskeletal:     Cervical back: Normal range of motion.     Right lower leg: No edema.     Left lower leg: No edema.  Skin:    General: Skin is warm and dry.     Nails: There is no clubbing.  Neurological:     Mental Status: She is alert and oriented to person, place, and time.  Psychiatric:        Speech: Speech normal.        Behavior: Behavior normal.        Thought Content: Thought content normal.        Judgment: Judgment normal.     Wt Readings from Last 3 Encounters:  04/22/23 172 lb (78 kg)  03/04/23 165 lb 3.2 oz (74.9 kg)  03/04/23 162 lb (73.5 kg)     ASSESSMENT & PLAN:   CAD involving the native coronary arteries with recent inferior ST elevation MI without angina: She is without further symptoms of angina or cardiac decompensation.  Recent myocardial PET/CT showed no evidence of infarction or ischemia.  Continue aggressive risk factor modification and secondary prevention.  We will reach out to interventional cardiology to see if she is able to discontinue ticagrelor moving forward.  Will update the patient with subsequent recommendations.  She will otherwise continue atorvastatin 80 mg, Imdur 15 mg, and Toprol-XL 12.5 mg.  NSVT: Quiescent.  Likely in the setting of acute MI.  Remains on Toprol-XL 12.5 mg.  HLD: LDL 52 in 07/2022 with normal AST/ALT in 02/2023.  She remains on atorvastatin 80 mg.  Dysphagia: Referral to GI at her  request.    Disposition: F/u with Dr. Okey Dupre or an APP in 6 months.   Medication Adjustments/Labs and Tests Ordered: Current medicines are reviewed at length with the patient today.  Concerns regarding medicines are outlined above. Medication changes, Labs and Tests ordered today are summarized above and listed in the Patient Instructions accessible in Encounters.   Signed, Eula Listen, PA-C 04/22/2023 5:09 PM     [facility] [place] [address] [place], [place] [address] [phone]

## 2023-04-21 ENCOUNTER — Other Ambulatory Visit: Payer: Self-pay | Admitting: Physician Assistant

## 2023-04-22 ENCOUNTER — Ambulatory Visit: Payer: Medicare Other | Attending: Physician Assistant | Admitting: Physician Assistant

## 2023-04-22 VITALS — BP 108/78 | HR 87 | Ht 62.0 in | Wt 172.0 lb

## 2023-04-22 DIAGNOSIS — I4729 Other ventricular tachycardia: Secondary | ICD-10-CM | POA: Diagnosis present

## 2023-04-22 DIAGNOSIS — E785 Hyperlipidemia, unspecified: Secondary | ICD-10-CM | POA: Insufficient documentation

## 2023-04-22 DIAGNOSIS — I2510 Atherosclerotic heart disease of native coronary artery without angina pectoris: Secondary | ICD-10-CM | POA: Insufficient documentation

## 2023-04-22 DIAGNOSIS — R1319 Other dysphagia: Secondary | ICD-10-CM | POA: Diagnosis present

## 2023-04-22 NOTE — Patient Instructions (Signed)
 Medication Instructions:  Your physician recommends that you continue on your current medications as directed. Please refer to the Current Medication list given to you today.  *If you need a refill on your cardiac medications before your next appointment, please call your pharmacy*   Lab Work: No labs ordered today  If you have labs (blood work) drawn today and your tests are completely normal, you will receive your results only by: MyChart Message (if you have MyChart) OR A paper copy in the mail If you have any lab test that is abnormal or we need to change your treatment, we will call you to review the results.   Testing/Procedures: No test ordered today    Follow-Up: At [facility], you and your health needs are our priority.  As part of our continuing mission to provide you with exceptional heart care, we have created designated Provider Care Teams.  These Care Teams include your primary Cardiologist (physician) and Advanced Practice Providers (APPs -  Physician Assistants and Nurse Practitioners) who all work together to provide you with the care you need, when you need it.  We recommend signing up for the patient portal called "MyChart".  Sign up information is provided on this After Visit Summary.  MyChart is used to connect with patients for Virtual Visits (Telemedicine).  Patients are able to view lab/test results, encounter notes, upcoming appointments, etc.  Non-urgent messages can be sent to your provider as well.   To learn more about what you can do with MyChart, go to [email or web address].    Your next appointment:   6 month(s)  Provider:   You may see Yvonne Kendall, MD or one of the following Advanced Practice Providers on your designated Care Team:   Nicolasa Ducking, NP Eula Listen, PA-C Cadence Fransico Michael, PA-C Charlsie Quest, NP Carlos Levering, NP

## 2023-04-29 ENCOUNTER — Inpatient Hospital Stay: Payer: Medicare Other | Attending: Internal Medicine

## 2023-04-29 DIAGNOSIS — Z79899 Other long term (current) drug therapy: Secondary | ICD-10-CM | POA: Insufficient documentation

## 2023-04-29 DIAGNOSIS — E538 Deficiency of other specified B group vitamins: Secondary | ICD-10-CM | POA: Insufficient documentation

## 2023-04-29 DIAGNOSIS — D701 Agranulocytosis secondary to cancer chemotherapy: Secondary | ICD-10-CM

## 2023-04-29 MED ORDER — CYANOCOBALAMIN 1000 MCG/ML IJ SOLN
1000.0000 ug | Freq: Once | INTRAMUSCULAR | Status: AC
  Administered 2023-04-29: 1000 ug via INTRAMUSCULAR
  Filled 2023-04-29: qty 1

## 2023-05-30 ENCOUNTER — Inpatient Hospital Stay: Payer: Medicare Other

## 2023-05-30 ENCOUNTER — Inpatient Hospital Stay: Payer: Medicare Other | Admitting: Internal Medicine

## 2023-05-30 ENCOUNTER — Encounter: Payer: Self-pay | Admitting: Internal Medicine

## 2023-05-30 ENCOUNTER — Inpatient Hospital Stay: Payer: Medicare Other | Attending: Internal Medicine

## 2023-05-30 VITALS — BP 118/80 | HR 73 | Temp 97.9°F | Resp 16 | Wt 170.0 lb

## 2023-05-30 DIAGNOSIS — Z923 Personal history of irradiation: Secondary | ICD-10-CM | POA: Insufficient documentation

## 2023-05-30 DIAGNOSIS — C01 Malignant neoplasm of base of tongue: Secondary | ICD-10-CM | POA: Diagnosis not present

## 2023-05-30 DIAGNOSIS — E538 Deficiency of other specified B group vitamins: Secondary | ICD-10-CM | POA: Insufficient documentation

## 2023-05-30 DIAGNOSIS — R1310 Dysphagia, unspecified: Secondary | ICD-10-CM | POA: Diagnosis not present

## 2023-05-30 DIAGNOSIS — Z9221 Personal history of antineoplastic chemotherapy: Secondary | ICD-10-CM | POA: Insufficient documentation

## 2023-05-30 DIAGNOSIS — Z85810 Personal history of malignant neoplasm of tongue: Secondary | ICD-10-CM | POA: Diagnosis present

## 2023-05-30 DIAGNOSIS — D701 Agranulocytosis secondary to cancer chemotherapy: Secondary | ICD-10-CM

## 2023-05-30 LAB — CMP (CANCER CENTER ONLY)
ALT: 18 U/L (ref 0–44)
AST: 20 U/L (ref 15–41)
Albumin: 4.1 g/dL (ref 3.5–5.0)
Alkaline Phosphatase: 87 U/L (ref 38–126)
Anion gap: 6 (ref 5–15)
BUN: 20 mg/dL (ref 8–23)
CO2: 23 mmol/L (ref 22–32)
Calcium: 9.1 mg/dL (ref 8.9–10.3)
Chloride: 109 mmol/L (ref 98–111)
Creatinine: 1.11 mg/dL — ABNORMAL HIGH (ref 0.44–1.00)
GFR, Estimated: 56 mL/min — ABNORMAL LOW (ref >60)
Glucose, Bld: 106 mg/dL — ABNORMAL HIGH (ref 70–99)
Potassium: 4 mmol/L (ref 3.5–5.1)
Sodium: 138 mmol/L (ref 135–145)
Total Bilirubin: 0.9 mg/dL (ref 0.0–1.2)
Total Protein: 7.2 g/dL (ref 6.5–8.1)

## 2023-05-30 LAB — CBC WITH DIFFERENTIAL (CANCER CENTER ONLY)
Abs Immature Granulocytes: 0.1 10*3/uL — ABNORMAL HIGH (ref 0.00–0.07)
Basophils Absolute: 0 10*3/uL (ref 0.0–0.1)
Basophils Relative: 0 %
Eosinophils Absolute: 0.4 10*3/uL (ref 0.0–0.5)
Eosinophils Relative: 4 %
HCT: 37.1 % (ref 36.0–46.0)
Hemoglobin: 12.1 g/dL (ref 12.0–15.0)
Immature Granulocytes: 1 %
Lymphocytes Relative: 17 %
Lymphs Abs: 1.4 10*3/uL (ref 0.7–4.0)
MCH: 29.6 pg (ref 26.0–34.0)
MCHC: 32.6 g/dL (ref 30.0–36.0)
MCV: 90.7 fL (ref 80.0–100.0)
Monocytes Absolute: 0.6 10*3/uL (ref 0.1–1.0)
Monocytes Relative: 7 %
Neutro Abs: 6 10*3/uL (ref 1.7–7.7)
Neutrophils Relative %: 71 %
Platelet Count: 246 10*3/uL (ref 150–400)
RBC: 4.09 MIL/uL (ref 3.87–5.11)
RDW: 13.8 % (ref 11.5–15.5)
WBC Count: 8.6 10*3/uL (ref 4.0–10.5)
nRBC: 0 % (ref 0.0–0.2)

## 2023-05-30 LAB — VITAMIN B12: Vitamin B-12: 378 pg/mL (ref 180–914)

## 2023-05-30 MED ORDER — CYANOCOBALAMIN 1000 MCG/ML IJ SOLN
1000.0000 ug | Freq: Once | INTRAMUSCULAR | Status: AC
  Administered 2023-05-30: 1000 ug via INTRAMUSCULAR
  Filled 2023-05-30: qty 1

## 2023-05-30 NOTE — Progress Notes (Signed)
 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  HISTORY OF PRESENTING ILLNESS: Patient ambulating-independently. Alone.  Connie Cameron 63 y.o.  female history of base of tongue squamous cell carcinoma; and also B12 deficiency is here for follow-up.  Complains of difficulty swallowing; positive for regurgitation- no weight loss. Hx of esophagus stricture  Patient reports increased fatigue.  Denies any difficulty in swallowing.  Denies any new lumps or bumps.   Patient denies any nausea vomiting.  Denies any fevers or chills.   Review of Systems  Constitutional:  Positive for malaise/fatigue. Negative for chills, diaphoresis, fever and weight loss.  HENT:  Negative for nosebleeds and sore throat.   Eyes:  Negative for double vision.  Respiratory:  Negative for cough, hemoptysis, sputum production, shortness of breath and wheezing.   Cardiovascular:  Negative for chest pain, palpitations, orthopnea and leg swelling.  Gastrointestinal:  Negative for abdominal pain, blood in stool, constipation, diarrhea, heartburn, melena, nausea and vomiting.  Genitourinary:  Negative for dysuria, frequency and urgency.  Musculoskeletal:  Positive for back pain and joint pain.  Skin: Negative.  Negative for itching and rash.  Neurological:  Negative for dizziness, tingling, focal weakness, weakness and headaches.  Endo/Heme/Allergies:  Does not bruise/bleed easily.  Psychiatric/Behavioral:  Negative for depression.  The patient is not nervous/anxious and does not have insomnia.      MEDICAL HISTORY:  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(HCC)     SURGICAL HISTORY: Past Surgical History:  Procedure Laterality Date   COLONOSCOPY WITH PROPOFOL  N/A 03/30/2020   Procedure: COLONOSCOPY WITH PROPOFOL ;  Surgeon: Irby Mannan, MD;  Location: [facility];  Service: Endoscopy;  Laterality: N/A;  COVID POSITIVE 02/29/2020   CORONARY/GRAFT ACUTE MI REVASCULARIZATION N/A 01/23/2022   Procedure: Coronary/Graft Acute MI Revascularization;  Surgeon: Sammy Crisp, MD;  Location: [facility];  Service: Cardiovascular;  Laterality: N/A;   ESOPHAGOGASTRODUODENOSCOPY (EGD) WITH PROPOFOL  N/A 03/30/2020   Procedure: ESOPHAGOGASTRODUODENOSCOPY (EGD) WITH PROPOFOL ;  Surgeon: Irby Mannan, MD;  Location: [facility] ENDOSCOPY;  Service: Endoscopy;  Laterality: N/A;   ESOPHAGOGASTRODUODENOSCOPY (EGD) WITH PROPOFOL  N/A 08/03/2020   Procedure: ESOPHAGOGASTRODUODENOSCOPY (EGD) WITH PROPOFOL ;  Surgeon: Irby Mannan, MD;  Location: [facility];  Service: Endoscopy;  Laterality: N/A;   ESOPHAGOGASTRODUODENOSCOPY (EGD) WITH PROPOFOL  N/A 01/25/2021   Procedure: ESOPHAGOGASTRODUODENOSCOPY (EGD) WITH PROPOFOL ;  Surgeon: Irby Mannan, MD;  Location: [facility];  Service: Endoscopy;  Laterality: N/A;   ESOPHAGOGASTRODUODENOSCOPY (EGD) WITH PROPOFOL  N/A 09/05/2021   Procedure: ESOPHAGOGASTRODUODENOSCOPY (EGD) WITH PROPOFOL ;  Surgeon: Selena Daily, MD;  Location: [facility];  Service: Gastroenterology;  Laterality: N/A;   EXCISION MASS NECK Left 01/18/2019   Procedure: EXCISION MASS NECK/NODE;  Surgeon: Mellody Sprout, MD;  Location: [facility] OR[facility];  Service: ENT;  Laterality: Left;   IR REMOVAL TUN ACCESS W/ PORT W/O FL MOD SED  12/05/2021   LARYNGOSCOPY Bilateral 02/22/2019    Procedure: MICROSCOPIC DIRECT LARYNGOSCOPY AND BIOPSY;  Surgeon:  Mellody Sprout, MD;  Location: [facility] OR[facility];  Service: ENT;  Laterality: Bilateral;   LEFT HEART CATH AND CORONARY ANGIOGRAPHY N/A 01/23/2022   Procedure: LEFT HEART CATH AND CORONARY ANGIOGRAPHY;  Surgeon: Sammy Crisp, MD;  Location: [facility];  Service: Cardiovascular;  Laterality: N/A;   PORTA CATH INSERTION N/A 03/24/2019   Procedure: PORTA CATH INSERTION;  Surgeon: Jackquelyn Mass, MD;  Location: [facility];  Service: Cardiovascular;  Laterality: N/A;   TONSILLECTOMY     TUBAL LIGATION      SOCIAL HISTORY: Social History   Socioeconomic History   Marital status: Single    Spouse name: Not on file   Number of children: Not on file   Years of education: Not on file   Highest education level: Not on file  Occupational History   Not on file  Tobacco Use   Smoking status: Never   Smokeless tobacco: Never  Vaping Use   Vaping status: Never Used  Substance and Sexual Activity   Alcohol use: Not Currently   Drug use: No   Sexual activity: Not on file  Other Topics Concern   Not on file  Social History Narrative   Not on file   Social Drivers of Health   Financial Resource Strain: Not on file  Food Insecurity: No Food Insecurity (03/04/2023)   Hunger Vital Sign    Worried About Running Out of Food in the Last Year: Never true    Ran Out of Food in the Last Year: Never true  Transportation Needs: No Transportation Needs (03/04/2023)   PRAPARE - Administrator, Civil Service (Medical): No    Lack of Transportation (Non-Medical): No  Physical Activity: Not on file  Stress: Not on file  Social Connections: Not on file  Intimate Partner Violence: Not At Risk (03/04/2023)   Humiliation, Afraid, Rape, and Kick questionnaire    Fear of Current or Ex-Partner: No    Emotionally Abused: No    Physically Abused: No    Sexually Abused: No    FAMILY HISTORY: Family History  Problem  Relation Age of Onset   Breast cancer Sister    Brain cancer Paternal Grandfather     ALLERGIES:  is allergic to chlorhexidine .  MEDICATIONS:  Current Outpatient Medications  Medication Sig Dispense Refill   ALPRAZolam  (XANAX ) 0.5 MG tablet Take 1 tablet (0.5 mg total) by mouth 2 (two) times daily as needed for anxiety. 60 tablet 2   aspirin  81 MG chewable tablet Chew 1 tablet (81 mg total) by mouth daily. 30 tablet 1   atorvastatin  (LIPITOR ) 80 MG tablet TAKE 1 TABLET BY MOUTH EVERY DAY 90 tablet 1   BRILINTA  90 MG TABS tablet TAKE 1 TABLET BY MOUTH 2 TIMES DAILY. 180 tablet 3   buPROPion  (WELLBUTRIN  SR) 150 MG 12 hr tablet Take 1 tablet (150 mg total) by mouth 2 (two) times daily. 60 tablet 3   Docusate Sodium (DSS) 100 MG CAPS Take 1 capsule by mouth every other day.     gabapentin  (NEURONTIN ) 300 MG capsule Take 1 capsule (300 mg total) by mouth 2 (two) times daily. 60 capsule 3   hydrOXYzine  (ATARAX /VISTARIL ) 25 MG tablet Take 25 mg by mouth daily as needed for nausea, vomiting or anxiety.     isosorbide  mononitrate (IMDUR ) 30 MG 24 hr tablet TAKE 1/2 OF A TABLET (15 MG TOTAL) BY MOUTH DAILY 45 tablet 1   meclizine  (ANTIVERT ) 12.5 MG tablet Take 1 tablet (  12.5 mg total) by mouth 3 (three) times daily as needed for dizziness or nausea. 30 tablet 0   methocarbamol (ROBAXIN) 500 MG tablet Take 500 mg by mouth at bedtime.     metoprolol  succinate (TOPROL -XL) 25 MG 24 hr tablet TAKE 0.5 TABLETS BY MOUTH DAILY. TAKE WITH OR IMMEDIATELY FOLLOWING A MEAL. 45 tablet 3   pantoprazole  (PROTONIX ) 40 MG tablet Take 1 tablet (40 mg total) by mouth daily. 30 tablet 1   sertraline  (ZOLOFT ) 100 MG tablet Take 100 mg by mouth daily.     Vitamin D, Ergocalciferol, (DRISDOL) 1.25 MG (50000 UNIT) CAPS capsule Take 50,000 Units by mouth once a week.     No current facility-administered medications for this visit.      Connie Cameron  PHYSICAL EXAMINATION:  Vitals:   05/30/23 1311  BP: 118/80  Pulse: 73   Resp: 16  Temp: 97.9 F (36.6 C)  SpO2: 100%   Filed Weights   05/30/23 1311  Weight: 170 lb (77.1 kg)    Physical Exam Vitals and nursing note reviewed.  HENT:     Head: Normocephalic and atraumatic.     Mouth/Throat:     Pharynx: Oropharynx is clear.  Eyes:     Extraocular Movements: Extraocular movements intact.     Pupils: Pupils are equal, round, and reactive to light.  Cardiovascular:     Rate and Rhythm: Normal rate and regular rhythm.  Pulmonary:     Comments: Decreased breath sounds bilaterally.  Abdominal:     Palpations: Abdomen is soft.  Musculoskeletal:        General: Normal range of motion.     Cervical back: Normal range of motion.  Skin:    General: Skin is warm.  Neurological:     General: No focal deficit present.     Mental Status: She is alert and oriented to person, place, and time.  Psychiatric:        Behavior: Behavior normal.        Judgment: Judgment normal.      LABORATORY DATA:  I have reviewed the data as listed Lab Results  Component Value Date   WBC 8.6 05/30/2023   HGB 12.1 05/30/2023   HCT 37.1 05/30/2023   MCV 90.7 05/30/2023   PLT 246 05/30/2023   Recent Labs    07/19/22 1109 11/29/22 1244 03/04/23 1400 05/30/23 1258  NA 141 140 143 138  K 3.8 3.8 4.1 4.0  CL 106 108 106 109  CO2 24 24 23 23   GLUCOSE 113* 115* 98 106*  BUN 16 16 18 20   CREATININE 1.01* 1.03* 1.09* 1.11*  CALCIUM  9.9 9.0 9.5 9.1  GFRNONAA >60 >60  --  56*  PROT 8.0 6.6 6.4 7.2  ALBUMIN 4.5 3.9 4.3 4.1  AST 32 27 23 20   ALT 20 20 18 18   ALKPHOS 93 89 118 87  BILITOT 0.9 0.7 0.4 0.9    RADIOGRAPHIC STUDIES: I have personally reviewed the radiological images as listed and agreed with the findings in the report. No results found.  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 Gerrie Krebs. Clinically no evidence of  recurrence.  Stable.  Continue clinical surveillance without imaging.  # Dysphagia- Hx of esophagus stricture- refer to [facility]- GI re: Dysphagia.  #  history of B12 deficiency-continue B12 injections monthly x6-  B12 pending today.  Continue B12 injection today.  # Port/IV access- s/p port explantation- PIV   # DISPOSITION:  # refer to [facility]- GI re: Dysphagia # B12 injection today;  # B12 injections monthly x6  # follow up in 6 months- MD: labs- cbc/cmp/tyroid profile; B12 levels; B12 injection; - Dr.B  All questions were answered. The patient knows to call the clinic with any problems, questions or concerns.    Gwyn Leos, MD 05/30/2023 2:24 PM

## 2023-05-30 NOTE — Assessment & Plan Note (Addendum)
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 Gerrie Krebs. Clinically no evidence of recurrence.  Stable.  Continue clinical surveillance without imaging.  # Dysphagia- Hx of esophagus stricture- refer to [facility]- GI re: Dysphagia.  #  history of B12 deficiency-continue B12 injections monthly x6- B12 pending today.  Continue B12 injection today.  # Port/IV access- s/p port explantation- PIV   # DISPOSITION:  # refer to [facility]- GI re: Dysphagia # B12 injection today;  # B12 injections monthly x6  # follow up in 6 months- MD: labs- cbc/cmp/tyroid profile; B12 levels; B12 injection; - Dr.B

## 2023-05-31 LAB — THYROID PANEL WITH TSH
Free Thyroxine Index: 1.7 (ref 1.2–4.9)
T3 Uptake Ratio: 24 % (ref 24–39)
T4, Total: 7.1 ug/dL (ref 4.5–12.0)
TSH: 4.71 u[IU]/mL — ABNORMAL HIGH (ref 0.450–4.500)

## 2023-06-02 ENCOUNTER — Ambulatory Visit: Payer: Medicare Other | Admitting: Family Medicine

## 2023-06-09 ENCOUNTER — Encounter: Payer: Self-pay | Admitting: Physician Assistant

## 2023-06-09 ENCOUNTER — Telehealth: Payer: Self-pay | Admitting: Gastroenterology

## 2023-06-09 NOTE — Telephone Encounter (Signed)
 The patient called to schedule an appointment with Dr. Baldomero Bone due to difficulty swallowing.

## 2023-06-17 ENCOUNTER — Encounter: Payer: Self-pay | Admitting: Family Medicine

## 2023-06-17 ENCOUNTER — Ambulatory Visit (INDEPENDENT_AMBULATORY_CARE_PROVIDER_SITE_OTHER): Admitting: Family Medicine

## 2023-06-17 VITALS — BP 129/83 | HR 65 | Temp 97.7°F | Resp 18 | Ht 62.0 in | Wt 175.0 lb

## 2023-06-17 DIAGNOSIS — R1319 Other dysphagia: Secondary | ICD-10-CM | POA: Diagnosis not present

## 2023-06-17 DIAGNOSIS — F419 Anxiety disorder, unspecified: Secondary | ICD-10-CM

## 2023-06-17 MED ORDER — ALPRAZOLAM 1 MG PO TABS: 1.0000 mg | ORAL_TABLET | Freq: Two times a day (BID) | ORAL | 2 refills | Status: DC | PRN

## 2023-06-17 NOTE — Assessment & Plan Note (Addendum)
 Xanax  0.5 mg BID is inadequate for her anxiety right now.  She increased her xanax  to 1 mg BID and it is more effective.  OK to continue xanax  1mg  BID for 3 months.  3 month FOLLOW-UP.

## 2023-06-17 NOTE — Progress Notes (Signed)
 Established Patient Office Visit  Subjective   Patient ID: Connie Cameron, female    DOB: 29-Apr-1960  Age: 63 y.o. 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 Just had a cardiology follow-up 06/03/2022 she had an echo LVEF 60 to 65% and RWMA.  05/29/2023 carotid arteries bilateral normal.  Myocardial PET CT 03/06/2023 LVEF perfusion normal with no ischemia and no infarction. LVEF 61% at rest and 79% stressed.  Normal study, low risk for ischemia.   She was having shortness of breath walking to the mailbox and taking a shower she have to stop to rest.  She reports her shortness of breath is greatly improved and she is okay walking to the mailbox now.  Denies shortness of breath with ambulation.  She has never been a cigarette smoker.   She reports epigastric pain when she eats she gets a hot poker sensation and then vomits up the food she is just eaten.  It does not happen every time she eats.  Reports no blood in the emesis.  She feels like her food is getting stuck in the epigastric region.  She has a history of esophageal stricture requiring stretching x 2.  Also reports hiatal hernia.  Has an appointment with GI on 5/19.  Is gaining weight and reports no peripheral edema. She gets her B12 monthly injections in the cancer center. She has been taking Xanax  0.5 mg twice daily but recently she is increased it to 1 mg twice daily because it has not been effective at controlling her anxiety.  She is also on Wellbutrin  SR 150 twice daily. PHQ-9 is 0 and GAD-7 is 3.    ROS    Objective:     BP 129/83 (BP Location: Left Arm, Patient Position: Sitting)   Pulse 65   Temp 97.7 F (36.5 C) (Oral)   Resp 18   Ht 5\' 2"  (1.575 m)   Wt 175 lb (79.4 kg)   SpO2 98%   BMI 32.01 kg/m     Physical Exam Vitals and nursing note reviewed.  Constitutional:      Appearance: Normal appearance.  HENT:     Head: Normocephalic and atraumatic.  Eyes:     Conjunctiva/sclera: Conjunctivae normal.  Cardiovascular:     Rate and Rhythm: Normal rate and regular rhythm.  Pulmonary:     Effort: Pulmonary effort is normal.     Breath sounds: Normal breath sounds.  Musculoskeletal:     Right lower leg: No edema.     Left lower leg: No edema.  Skin:    General: Skin is warm and dry.  Neurological:     Mental Status: She is alert and oriented to person, place, and time.  Psychiatric:        Mood and Affect: Mood normal.        Behavior: Behavior normal.        Thought Content: Thought content normal.        Judgment: Judgment normal.          No results found for any visits on 06/17/23.    The ASCVD Risk score (Arnett DK, et al., 2019) failed to calculate for the following reasons:   Risk score cannot be calculated because patient has a medical history suggesting prior/existing ASCVD  Assessment & Plan:  Esophageal dysphagia Assessment & Plan: Has a history of esophageal ring requiring stretching times 2.   Having esophageal dysphagia with food feeling like it gets caught and then vomiting.  No blood in the emesis.  No black emesis.  Has an appointment with GI 06/23/2023.  She did you have the sensation that food is caught in her esophagus and it does not come up and it feels stuck please go to the ED.  If you have bloody emesis or black emesis please go to the ED.   Anxiety Assessment & Plan: Xanax  0.5 mg BID is inadequate for her anxiety right now.  She increased her xanax  to 1 mg BID and it is more effective.  OK to continue xanax  1mg  BID for 3 months.  3 month FOLLOW-UP.     Other orders -     ALPRAZolam ; Take 1 tablet (1 mg total) by mouth 2 (two) times daily as needed for anxiety.  Dispense: 60 tablet; Refill: 2     Return in about 3 months (around  09/17/2023).    Carianna Lague K Aira Sallade, MD

## 2023-06-17 NOTE — Assessment & Plan Note (Addendum)
 Has a history of esophageal ring requiring stretching times 2.   Having esophageal dysphagia with food feeling like it gets caught and then vomiting.  No blood in the emesis.  No black emesis.  Has an appointment with GI 06/23/2023.  She did you have the sensation that food is caught in her esophagus and it does not come up and it feels stuck please go to the ED.  If you have bloody emesis or black emesis please go to the ED.

## 2023-06-18 ENCOUNTER — Emergency Department
Admission: EM | Admit: 2023-06-18 | Discharge: 2023-06-18 | Disposition: A | Discharge status: Home / Self Care | Attending: Emergency Medicine | Admitting: Emergency Medicine

## 2023-06-18 ENCOUNTER — Other Ambulatory Visit: Payer: Self-pay

## 2023-06-18 DIAGNOSIS — R55 Syncope and collapse: Secondary | ICD-10-CM | POA: Insufficient documentation

## 2023-06-18 DIAGNOSIS — Z85818 Personal history of malignant neoplasm of other sites of lip, oral cavity, and pharynx: Secondary | ICD-10-CM | POA: Insufficient documentation

## 2023-06-18 DIAGNOSIS — R110 Nausea: Secondary | ICD-10-CM | POA: Insufficient documentation

## 2023-06-18 DIAGNOSIS — R42 Dizziness and giddiness: Secondary | ICD-10-CM | POA: Diagnosis not present

## 2023-06-18 LAB — COMPREHENSIVE METABOLIC PANEL WITH GFR
ALT: 14 U/L (ref 0–44)
AST: 24 U/L (ref 15–41)
Albumin: 3.4 g/dL — ABNORMAL LOW (ref 3.5–5.0)
Alkaline Phosphatase: 71 U/L (ref 38–126)
Anion gap: 8 (ref 5–15)
BUN: 21 mg/dL (ref 8–23)
CO2: 20 mmol/L — ABNORMAL LOW (ref 22–32)
Calcium: 8.9 mg/dL (ref 8.9–10.3)
Chloride: 114 mmol/L — ABNORMAL HIGH (ref 98–111)
Creatinine, Ser: 1.2 mg/dL — ABNORMAL HIGH (ref 0.44–1.00)
GFR, Estimated: 51 mL/min — ABNORMAL LOW (ref >60)
Glucose, Bld: 101 mg/dL — ABNORMAL HIGH (ref 70–99)
Potassium: 3.8 mmol/L (ref 3.5–5.1)
Sodium: 142 mmol/L (ref 135–145)
Total Bilirubin: 1 mg/dL (ref 0.0–1.2)
Total Protein: 6.3 g/dL — ABNORMAL LOW (ref 6.5–8.1)

## 2023-06-18 LAB — CBC
HCT: 32.1 % — ABNORMAL LOW (ref 36.0–46.0)
Hemoglobin: 10.5 g/dL — ABNORMAL LOW (ref 12.0–15.0)
MCH: 30.3 pg (ref 26.0–34.0)
MCHC: 32.7 g/dL (ref 30.0–36.0)
MCV: 92.8 fL (ref 80.0–100.0)
Platelets: 205 10*3/uL (ref 150–400)
RBC: 3.46 MIL/uL — ABNORMAL LOW (ref 3.87–5.11)
RDW: 13.9 % (ref 11.5–15.5)
WBC: 5.8 10*3/uL (ref 4.0–10.5)
nRBC: 0 % (ref 0.0–0.2)

## 2023-06-18 LAB — TROPONIN I (HIGH SENSITIVITY): Troponin I (High Sensitivity): <2 ng/L (ref <18)

## 2023-06-18 MED ORDER — ONDANSETRON HCL 4 MG/2ML IJ SOLN
4.0000 mg | Freq: Once | INTRAMUSCULAR | Status: AC
  Administered 2023-06-18: 4 mg via INTRAVENOUS
  Filled 2023-06-18: qty 2

## 2023-06-18 MED ORDER — SODIUM CHLORIDE 0.9 % IV SOLN
Freq: Once | INTRAVENOUS | Status: AC
Start: 2023-06-18 — End: 2023-06-18

## 2023-06-18 NOTE — ED Triage Notes (Signed)
 Per EMS patient smoked a Delta 9 pen that she purchased online; post smoking patient had syncopal episode witnessed by family  Initial BP 93/54

## 2023-06-18 NOTE — ED Provider Notes (Signed)
 [facility] Provider Note    Event Date/Time   First MD Initiated Contact with Patient 06/18/23 1539     (approximate)  History   Chief Complaint: Loss of Consciousness  HPI  SERRINA VIRGIN is a 63 y.o. female with a past medical history of anxiety, depression, gastric reflux, prior oral/throat cancer, presents to the emergency department for a syncopal episode.  According to the patient her friend brought over a cigar that she had purchased off the Internet and asked if she wanted to smoke it.  The patient agreed, she believes it was delta 8.  She states shortly after the smoke the scar she began feeling unwell lightheaded and nauseated.  Patient had a syncopal episode.  Patient denies any chest pain or shortness of breath.  Patient is hypotensive currently 93/54.  Otherwise patient states she is beginning to feel better.  Physical Exam   Triage Vital Signs: ED Triage Vitals [06/18/23 1533]  Encounter Vitals Group     BP      Systolic BP Percentile      Diastolic BP Percentile      Pulse      Resp      Temp      Temp src      SpO2      Weight 180 lb (81.6 kg)     Height 5\' 2"  (1.575 m)     Head Circumference      Peak Flow      Pain Score 0     Pain Loc      Pain Education      Exclude from Growth Chart     Most recent vital signs: There were no vitals filed for this visit.  General: Awake, no distress.  Dry mucous membranes. CV:  Good peripheral perfusion.  Regular rate and rhythm  Resp:  Normal effort.  Equal breath sounds bilaterally.  Abd:  No distention.  Soft, nontender.  No rebound or guarding.  ED Results / Procedures / Treatments   EKG  EKG viewed and interpreted by myself shows a normal sinus rhythm at 73 bpm with a narrow QRS, normal axis, normal intervals, no concerning ST changes.  MEDICATIONS ORDERED IN ED: Medications  0.9 %  sodium chloride  infusion (has no administration in time range)  ondansetron  (ZOFRAN ) injection  4 mg (has no administration in time range)     IMPRESSION / MDM / ASSESSMENT AND PLAN / ED COURSE  I reviewed the triage vital signs and the nursing notes.  Patient's presentation is most consistent with acute presentation with potential threat to life or bodily function.  Patient presents to the emergency department after syncopal episode.  This occurred shortly after smoking what appears to be a delta 8 cigar that a friend had purchased on the Internet.  Shortly after they smoked a cigar patient states she began feeling very weak and nauseated had a syncopal episode.  Here the patient has dry mucous membranes, somewhat pale in appearance.  Denies any chest pain shortness of breath or abdominal pain.  Reassuring physical exam otherwise.  Will IV hydrate, treat with Zofran  and continue to closely monitor.  Will check basic labs while awaiting results.  Patient's workup is reassuring reassuring CBC, reassuring chemistry with a anion gap of 8 troponin is negative.  After IV fluids patient is feeling much better blood pressure currently 105/73.  Patient states he is feeling well and wishes to go home.  She will work  on getting a ride.  FINAL CLINICAL IMPRESSION(S) / ED DIAGNOSES   Syncope   Note:  This document was prepared using Dragon voice recognition software and may include unintentional dictation errors.   Ruth Cove, MD 06/18/23 2153

## 2023-06-20 ENCOUNTER — Telehealth: Payer: Self-pay | Admitting: Internal Medicine

## 2023-06-20 NOTE — Telephone Encounter (Signed)
 Called patient, advised that on 05/15 she went to the ED via EMS bc she passed out at home. She states her blood pressure dropped- per her report. She advised she just has soreness in her chest area, it does worsen when she takes a deep breath, and when she pushes around her heart area on her chest. She denies any other symptoms (lightheaded, dizzy, SOB). Patient advised that she has trouble eating and can get strangled sometimes, she has had issues with her esophagus before, and she does have an appointment with Priscilla Brothers on Monday- I advised patient to keep this appointment, but I would route to MD to make aware and call back with any other recommendations.

## 2023-06-20 NOTE — Telephone Encounter (Signed)
  Pt c/o of Chest Pain: STAT if active CP, including tightness, pressure, jaw pain, radiating pain to shoulder/upper arm/back, CP unrelieved by Nitro. Symptoms reported of SOB, nausea, vomiting, sweating.  1. Are you having CP right now?   A "discomfort in her chest"  2. Are you experiencing any other symptoms (ex. SOB, nausea, vomiting, sweating)?  Nausea  3. Is your CP continuous or coming and going?  Continuous  4. Have you taken Nitroglycerin ?   No  5. How long have you been experiencing CP?  Since Wednesday afternoon  6. If NO CP at time of call then end call with telling Pt to call back or call 911 if Chest pain returns prior to return call from triage team.   Patient is concerned she is having a "soreness" in her her chest.  Patient is also following-up on her lab results.

## 2023-06-20 NOTE — Telephone Encounter (Signed)
 I recommend that Ms. Melley keep her GI appointment and follow-up with Varney Gentleman, PA, at her earliest convenience to reassess her syncope and chest pain.  If symptoms worsen in the meantime, she should return to the ED for further evaluation.  Sammy Crisp, MD [facility]

## 2023-06-23 ENCOUNTER — Ambulatory Visit (INDEPENDENT_AMBULATORY_CARE_PROVIDER_SITE_OTHER): Admitting: Gastroenterology

## 2023-06-23 VITALS — BP 139/89 | HR 79 | Temp 98.1°F | Wt 172.0 lb

## 2023-06-23 DIAGNOSIS — R1310 Dysphagia, unspecified: Secondary | ICD-10-CM | POA: Diagnosis not present

## 2023-06-23 DIAGNOSIS — Z8719 Personal history of other diseases of the digestive system: Secondary | ICD-10-CM

## 2023-06-23 DIAGNOSIS — R1319 Other dysphagia: Secondary | ICD-10-CM

## 2023-06-23 DIAGNOSIS — K2210 Ulcer of esophagus without bleeding: Secondary | ICD-10-CM

## 2023-06-23 MED ORDER — OMEPRAZOLE 40 MG PO CPDR: 40.0000 mg | DELAYED_RELEASE_CAPSULE | Freq: Two times a day (BID) | ORAL | 1 refills | Status: DC

## 2023-06-23 NOTE — Telephone Encounter (Signed)
 Unable to leave voicemail due to mailbox not being set up. Called to schedule appt with Varney Gentleman, PA.

## 2023-06-23 NOTE — Progress Notes (Signed)
 Karma Oz, MD [address]  [address]  [place], [place] [address]  Main: [phone]  Fax: [phone]    Gastroenterology Consultation  Referring Provider:     Ziglar, Susan K, MD Primary Care Physician:  Ziglar, Susan K, MD Primary Gastroenterologist:  Dr.  Lea Primmer for Consultation:          HPI:   Connie Cameron is a 63 y.o. female referred by Dr. Ziglar, Susan K, MD  for consultation & management of upper abdominal pain.  Patient reports 1 month history of upper abdominal pain.  She underwent upper endoscopy by Dr. Tully Gainer in 12/22 for dysphagia, found to have mild narrowing of Schatzki's ring which was dilated to 18 mm.  She also had scattered nonbleeding small ulcers in gastric antrum, biopsies did not reveal H. pylori.  Patient has been taking omeprazole  20 mg daily.  She has been gaining weight, gained about 10 pounds since last visit  Follow-up visit 08/21/2021 Patient reports ongoing epigastric/right upper quadrant pain and accompanied by her grandson today.  Patient continues to take Prilosec 40 mg once a day before breakfast.  Patient denies any nausea or vomiting.  Follow-up visit 06/23/2023 Connie Cameron is here to discuss about symptoms of trouble swallowing.  She is specifically notes that whenever she eats bread, feels stuck in her lower chest.  She does have history of mild erosive esophagitis based on upper endoscopy in 2023.  She denies any trouble swallowing liquids on other solid foods.  She has not been taking any PPI.  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  She does not follow healthy eating habits.  NSAIDs: None  Antiplts/Anticoagulants/Anti thrombotics: Brilinta   GI Procedures:  Upper endoscopy 09/05/21 - Normal duodenal bulb and second portion of the duodenum. - Medium- sized hiatal hernia. - Normal stomach. - LA Grade B reflux esophagitis with no bleeding. EGD 01/25/2021 - Low-grade  of narrowing Schatzki ring. Dilated to 18 mm. - Erythematous mucosa in the antrum. Biopsied. - Non-bleeding gastric ulcers with a clean ulcer base (Forrest Class III). Biopsied. - Erosive gastropathy with no bleeding and no stigmata of recent bleeding. Biopsied. - Small hiatal hernia. - Duodenal erosion without bleeding. - Erythematous duodenopathy. - Biopsies were obtained in the gastric body, at the incisura and in the gastric antrum. DIAGNOSIS:  A. STOMACH ULCERS; COLD BIOPSY:  - CHRONIC AND FOCALLY ACTIVE GASTRITIS.  - NEGATIVE FOR HELICOBACTER PYLORI BY IMMUNOHISTOCHEMISTRY.  - NEGATIVE FOR INTESTINAL METAPLASIA, DYSPLASIA, AND MALIGNANCY.   Upper endoscopy 08/03/2020 - Mucosal nodule found in the esophagus. Biopsied. - White nummular lesions in esophageal mucosa. Brushings performed. - LA Grade A reflux esophagitis with no bleeding. - Erythematous mucosa in the antrum. Biopsied. - A single mucosal papule (nodule) found in the stomach. Biopsied. - Mucosal nodule found in the duodenum. Biopsied. - Biopsies were obtained in the gastric body, at the incisura and in the gastric antrum. DIAGNOSIS:  A. DUODENUM BULB NODULE; COLD BIOPSY:  - NODULAR PEPTIC DUODENITIS.  - NEGATIVE FOR FEATURES OF CELIAC DISEASE.  - NEGATIVE FOR DYSPLASIA AND MALIGNANCY.   B. STOMACH, ANTRUM, INCISURA, BODY; COLD BIOPSY:  - CHRONIC GASTRITIS.  - NEGATIVE FOR ACTIVE INFLAMMATION AND H PYLORI.  - NEGATIVE FOR INTESTINAL METAPLASIA, DYSPLASIA, AND MALIGNANCY.   C. STOMACH NODULE, INCISURA; COLD BIOPSY:  - POLYPOID GASTRIC MUCOSA WITH NO SIGNIFICANT PATHOLOGIC ALTERATION.  - NEGATIVE FOR ACTIVE INFLAMMATION AND H PYLORI.  -  NEGATIVE FOR INTESTINAL METAPLASIA, DYSPLASIA, AND MALIGNANCY.   D. ESOPHAGUS LESION, 20 CM; COLD BIOPSY:  - FRAGMENTS OF SQUAMOUS MUCOSA WITH FEATURES POTENTIALLY COMPATIBLE WITH SQUAMOUS PAPILLOMA.  - NEGATIVE FOR DYSPLASIA AND MALIGNANCY.   Colonoscopy 03/30/2020 -  Diverticulosis in the sigmoid colon. - The examination was otherwise normal. - The rectum, sigmoid colon, descending colon, transverse colon, ascending colon and cecum are normal. - Non-bleeding internal hemorrhoids. - No specimens collected.  Upper endoscopy 03/30/2020 DIAGNOSIS:  A.  STOMACH; COLD BIOPSY:  - MODERATE CHRONIC ACTIVE GASTRITIS.  - IHC FOR H.PYLORI IS NEGATIVE, SEE COMMENT.  - NEGATIVE FOR DYSPLASIA AND MALIGNANCY.   B ESOPHAGUS; COLD BIOPSY:  - FRAGMENTS OF SQUAMOUS MUCOSA WITH PATCHY GLYCOGEN ACANTHOSIS AND FOCAL  MILD ACUTE ESOPHAGITIS.  - PASDF STAIN IS NEGATIVE FOR YEAST / FUNGAL HYPHAE; STAIN CONTROLS  WORKED APPROPRIATELY.  - FRAGMENTS OF GASTRIC CARDIAC TYPE MUCOSA WITH MODERATE CHRONIC ACTIVE  GASTRITIS.  - NEGATIVE FOR INTRAEPITHELIAL EOSINOPHILS, DYSPLASIA, AND MALIGNANCY.   C.  ESOPHAGUS, 20 CM; COLD BIOPSY:  - BENIGN SQUAMOUS PAPILLOMA.  - NEGATIVE FOR DYSPLASIA AND MALIGNANCY.   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[facility])     Past Surgical History:  Procedure Laterality Date   COLONOSCOPY WITH PROPOFOL  N/A 03/30/2020   Procedure: COLONOSCOPY WITH PROPOFOL ;  Surgeon: Irby Mannan, MD;  Location: [facility];  Service: Endoscopy;  Laterality: N/A;  COVID POSITIVE 02/29/2020   CORONARY/GRAFT ACUTE MI REVASCULARIZATION N/A 01/23/2022   Procedure: Coronary/Graft Acute MI Revascularization;  Surgeon: Sammy Crisp, MD;  Location: [facility];  Service: Cardiovascular;  Laterality: N/A;   ESOPHAGOGASTRODUODENOSCOPY (EGD) WITH PROPOFOL  N/A 03/30/2020   Procedure: ESOPHAGOGASTRODUODENOSCOPY (EGD) WITH PROPOFOL ;  Surgeon: Irby Mannan, MD;  Location: [facility];  Service: Endoscopy;  Laterality: N/A;   ESOPHAGOGASTRODUODENOSCOPY (EGD) WITH PROPOFOL  N/A 08/03/2020   Procedure: ESOPHAGOGASTRODUODENOSCOPY (EGD) WITH  PROPOFOL ;  Surgeon: Irby Mannan, MD;  Location: [facility];  Service: Endoscopy;  Laterality: N/A;   ESOPHAGOGASTRODUODENOSCOPY (EGD) WITH PROPOFOL  N/A 01/25/2021   Procedure: ESOPHAGOGASTRODUODENOSCOPY (EGD) WITH PROPOFOL ;  Surgeon: Irby Mannan, MD;  Location: [facility];  Service: Endoscopy;  Laterality: N/A;   ESOPHAGOGASTRODUODENOSCOPY (EGD) WITH PROPOFOL  N/A 09/05/2021   Procedure: ESOPHAGOGASTRODUODENOSCOPY (EGD) WITH PROPOFOL ;  Surgeon: Selena Daily, MD;  Location: [facility];  Service: Gastroenterology;  Laterality: N/A;   EXCISION MASS NECK Left 01/18/2019   Procedure: EXCISION MASS NECK/NODE;  Surgeon: Mellody Sprout, MD;  Location: [facility];  Service: ENT;  Laterality: Left;   IR REMOVAL TUN ACCESS W/ PORT W/O FL MOD SED  12/05/2021   LARYNGOSCOPY Bilateral 02/22/2019   Procedure: MICROSCOPIC DIRECT LARYNGOSCOPY AND BIOPSY;  Surgeon: Mellody Sprout, MD;  Location: [facility];  Service: ENT;  Laterality: Bilateral;   LEFT HEART CATH AND CORONARY ANGIOGRAPHY N/A 01/23/2022   Procedure: LEFT HEART CATH AND CORONARY ANGIOGRAPHY;  Surgeon: Sammy Crisp, MD;  Location: [facility];  Service: Cardiovascular;  Laterality: N/A;   PORTA CATH INSERTION N/A 03/24/2019   Procedure: PORTA CATH INSERTION;  Surgeon: Jackquelyn Mass, MD;  Location: [facility];  Service: Cardiovascular;  Laterality: N/A;   TONSILLECTOMY     TUBAL LIGATION      Current Outpatient Medications:    ALPRAZolam  (XANAX ) 1 MG tablet, Take 1 tablet (1 mg total) by mouth 2 (two) times daily  as needed for anxiety., Disp: 60 tablet, Rfl: 2   aspirin  81 MG chewable tablet, Chew 1 tablet (81 mg total) by mouth daily., Disp: 30 tablet, Rfl: 1   atorvastatin  (LIPITOR ) 80 MG tablet, TAKE 1 TABLET BY MOUTH EVERY DAY, Disp: 90 tablet, Rfl: 1   BRILINTA  90 MG TABS tablet, TAKE 1 TABLET BY MOUTH 2 TIMES DAILY., Disp: 180 tablet, Rfl: 3   buPROPion  (WELLBUTRIN  SR) 150 MG 12 hr tablet,  Take 1 tablet (150 mg total) by mouth 2 (two) times daily., Disp: 60 tablet, Rfl: 3   Docusate Sodium (DSS) 100 MG CAPS, Take 1 capsule by mouth every other day., Disp: , Rfl:    gabapentin  (NEURONTIN ) 300 MG capsule, Take 1 capsule (300 mg total) by mouth 2 (two) times daily., Disp: 60 capsule, Rfl: 3   hydrOXYzine  (ATARAX /VISTARIL ) 25 MG tablet, Take 25 mg by mouth daily as needed for nausea, vomiting or anxiety., Disp: , Rfl:    isosorbide  mononitrate (IMDUR ) 30 MG 24 hr tablet, TAKE 1/2 OF A TABLET (15 MG TOTAL) BY MOUTH DAILY, Disp: 45 tablet, Rfl: 1   meclizine  (ANTIVERT ) 12.5 MG tablet, Take 1 tablet (12.5 mg total) by mouth 3 (three) times daily as needed for dizziness or nausea., Disp: 30 tablet, Rfl: 0   methocarbamol (ROBAXIN) 500 MG tablet, Take 500 mg by mouth at bedtime., Disp: , Rfl:    metoprolol  succinate (TOPROL -XL) 25 MG 24 hr tablet, TAKE 0.5 TABLETS BY MOUTH DAILY. TAKE WITH OR IMMEDIATELY FOLLOWING A MEAL., Disp: 45 tablet, Rfl: 3   sertraline  (ZOLOFT ) 100 MG tablet, Take 100 mg by mouth daily., Disp: , Rfl:    Vitamin D, Ergocalciferol, (DRISDOL) 1.25 MG (50000 UNIT) CAPS capsule, Take 50,000 Units by mouth once a week., Disp: , Rfl:    omeprazole  (PRILOSEC) 40 MG capsule, Take 1 capsule (40 mg total) by mouth 2 (two) times daily before a meal., Disp: 60 capsule, Rfl: 1    Family History  Problem Relation Age of Onset   Breast cancer Sister    Brain cancer Paternal Grandfather      Social History   Tobacco Use   Smoking status: Never    Passive exposure: Never   Smokeless tobacco: Never  Vaping Use   Vaping status: Never Used  Substance Use Topics   Alcohol use: Not Currently   Drug use: No    Allergies as of 06/23/2023 - Review Complete 06/23/2023  Allergen Reaction Noted   Chlorhexidine   06/29/2020    Review of Systems:    All systems reviewed and negative except where noted in HPI.   Physical Exam:  BP 139/89 (BP Location: Left Arm, Patient  Position: Sitting, Cuff Size: Normal)   Pulse 79   Temp 98.1 F (36.7 C) (Oral)   Wt 172 lb (78 kg)   BMI 31.46 kg/m  No LMP recorded. Patient is postmenopausal.  General:   Alert,  Well-developed, well-nourished, pleasant and cooperative in NAD Head:  Normocephalic and atraumatic. Eyes:  Sclera clear, no icterus.   Conjunctiva pink. Ears:  Normal auditory acuity. Nose:  No deformity, discharge, or lesions. Mouth:  No deformity or lesions,oropharynx pink & moist. Neck:  Supple; no masses or thyromegaly. Lungs:  Respirations even and unlabored.  Clear throughout to auscultation.   No wheezes, crackles, or rhonchi. No acute distress. Heart:  Regular rate and rhythm; no murmurs, clicks, rubs, or gallops. Abdomen:  Normal bowel sounds. Soft, non-tender and non-distended without masses, hepatosplenomegaly or hernias  noted.  No guarding or rebound tenderness.   Rectal: Not performed Msk:  Symmetrical without gross deformities. Good, equal movement & strength bilaterally. Pulses:  Normal pulses noted. Extremities:  No clubbing or edema.  No cyanosis. Neurologic:  Alert and oriented x3;  grossly normal neurologically. Skin:  Intact without significant lesions or rashes. No jaundice. Psych:  Alert and cooperative. Normal mood and affect.  Imaging Studies: Reviewed  Assessment and Plan:   Connie Cameron is a 63 y.o. female with history of anxiety, depression, history of dysphagia s/p dilation of Schatzki's ring in 01/2021 to 18 mm, history of erosive esophagitis, coronary artery disease, STEMI in 01/2022 status post PCI to RCA with DES on Brilinta , history of head and neck cancer is seen in consultation for recurrence of dysphagia particularly to bread only  Dysphagia to solids, previous dilation of Schatzki's ring in the past, history of erosive esophagitis Discussed with patient to start omeprazole  40 Mg twice daily before meals and continue 1-2 times daily long-term Discussed about  healthy eating habits, avoid hard meats Discussed about upper endoscopy after obtaining cardiac clearance because she will need interruption of Brilinta  if there is a stricture and possible dilation. Informed her that I am leaving the practice by end of the month and joining [facility].  She can follow-up with me after obtaining cardiac clearance  Follow up as needed   Karma Oz, MD

## 2023-06-23 NOTE — Patient Instructions (Addendum)
[facility] phone number [phone].

## 2023-06-25 ENCOUNTER — Encounter: Payer: Self-pay | Admitting: Gastroenterology

## 2023-07-01 ENCOUNTER — Inpatient Hospital Stay: Attending: Internal Medicine

## 2023-07-01 DIAGNOSIS — E538 Deficiency of other specified B group vitamins: Secondary | ICD-10-CM | POA: Insufficient documentation

## 2023-07-01 DIAGNOSIS — T451X5A Adverse effect of antineoplastic and immunosuppressive drugs, initial encounter: Secondary | ICD-10-CM

## 2023-07-01 MED ORDER — CYANOCOBALAMIN 1000 MCG/ML IJ SOLN
1000.0000 ug | Freq: Once | INTRAMUSCULAR | Status: AC
  Administered 2023-07-01: 1000 ug via INTRAMUSCULAR
  Filled 2023-07-01: qty 1

## 2023-07-02 ENCOUNTER — Other Ambulatory Visit: Payer: Self-pay | Admitting: Internal Medicine

## 2023-07-02 ENCOUNTER — Other Ambulatory Visit: Payer: Self-pay | Admitting: Family Medicine

## 2023-07-02 DIAGNOSIS — F419 Anxiety disorder, unspecified: Secondary | ICD-10-CM

## 2023-07-16 ENCOUNTER — Other Ambulatory Visit: Payer: Self-pay | Admitting: Gastroenterology

## 2023-07-16 DIAGNOSIS — R1319 Other dysphagia: Secondary | ICD-10-CM

## 2023-07-16 DIAGNOSIS — K2210 Ulcer of esophagus without bleeding: Secondary | ICD-10-CM

## 2023-07-16 NOTE — Progress Notes (Signed)
 Cardiology Office Note    Date:  07/17/2023   ID:  Connie Cameron, DOB 1960-09-23, MRN [id number]  PCP:  Ziglar, Susan K, MD  Cardiologist:  Sammy Crisp, MD  Electrophysiologist:  None   Chief Complaint: Preprocedure cardiac risk stratification  History of Present Illness:   Connie Cameron is a 63 y.o. female with history of CAD with inferior ST elevation MI on 01/23/2022, HLD, and head and neck cancer who presents for preprocedure cardiac risk stratification for EGD.   She was admitted to [facility] in 01/2022 with an inferior ST elevation MI.  LHC showed severe single-vessel CAD with 99% mid RCA stenosis felt to likely be due to acute plaque rupture.  There was also 50 to 60% proximal RCA stenosis as well as moderate to severe, but noncritical left coronary artery disease.  She underwent successful PCI/DES to the proximal and mid RCA using nonoverlapping drug-eluting stents.  There was a jailed RV marginal branch arising from the mid RCA that demonstrated 70% stenosis of pre-PCI that increased to 90% with plaque shift.  There was TIMI II flow at the end of the procedure.  The vessel was a small branch and too small for intervention.  The left coronary artery disease was favored for medical management as the most severe stenoses involved small/distal branches.  Echo during the admission demonstrated an EF of 60 to 65%, no regional wall motion abnormalities, mild LVH, normal LV diastolic function parameters, normal RV systolic function, mild mitral regurgitation, and aortic valve sclerosis without evidence of stenosis.  High-sensitivity troponin peaked at 3204.  Post intervention there was some NSVT that resolved prior to discharge.  She was seen in hospital follow-up in 02/2022 and was without symptoms of angina or cardiac decompensation.  She did note some mild positional dizziness with recommendation to decrease Toprol -XL to 12.5 mg daily.  Follow-up labs obtained in 04/2022 showed an improved LDL  of 80 (prior 161 not on statin therapy).  Prescription for PCSK9 inhibitor was sent in.  She was seen in the office in 04/2022, noting some intermittently randomly occurring chest tightness and shortness of breath without frank chest pain.  She also noted some left-sided neck discomfort and headache.  She was started on Imdur  15 mg.  Limited echo in 05/2022 demonstrated an EF of 60 to 65%, no regional wall motion abnormalities, normal LV diastolic function parameters, normal RV systolic function and ventricular cavity size, no significant valvular abnormalities, and an estimated right atrial pressure of 3 mmHg.  Carotid artery ultrasound in 05/2022 showed no evidence of carotid artery stenosis with antegrade flow of the bilateral vertebral arteries and a normal flow hemodynamics in the bilateral subclavian arteries.  She was seen in the office in 02/2023 and noted an approximate 1 month history of exertional shortness of breath with associated fatigue and sweating.  Symptoms were similar, though not as severe as what she experienced leading up to her MI.  Subsequent myocardial PET/CT on 03/06/2023 showed no evidence of ischemia or infarction with preserved LV systolic function at rest and stress, and was overall low risk/normal.  She was last seen in the office in 04/2023 noting the previously mentioned chest discomfort had improved.  She was dealing with intermittent dysphagia.  She was seen in the ED on 06/18/2023 after having had a syncopal episode after smoking a Delta-8 cigar that she purchased off of the Internet.  Episode was preceded by lightheadedness and nausea with dysphagia and vomiting.  She was  without chest pain or dyspnea.  BP was soft in the ER at 93/54.  EKG showed sinus rhythm without acute ischemic changes.  High-sensitivity troponin negative.  Serum creatinine mildly elevated at 1.2.  Hgb 10.5 which is slightly below her prior baseline of 11-12.  She felt symptomatically better with IV fluids and was  discharged from the ED.  She was evaluated by GI on 06/23/2023 for dysphagia with plans for possible EGD.  She comes in today and is without symptoms of angina or cardiac decompensation.  She has been without chest pain, dizziness, presyncope, or syncope.  She continues to have issues with dysphagia and is getting food stuck leading to emesis.  She has been without further syncopal episodes.  No dizziness or near syncope.  No falls or symptoms concerning for bleeding.  Adherent to cardiac pharmacotherapy.  Duke Activity Status Index: > 4 METs without cardiac limitation Revised Cardiac Risk Index: estimated rate of 6% for adverse cardiac in the periprocedural timeframe    Labs independently reviewed: 06/2023 - Hgb 10.5, PLT 205, potassium 3.8, BUN 21, serum creatinine 1.2, albumin 3.4, AST/ALT normal 05/2023 - TSH 4.71 07/2022 - TC 117, TG 99, HDL 45, LDL 52  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(HCC)     Past Surgical History:  Procedure Laterality Date   COLONOSCOPY WITH PROPOFOL  N/A 03/30/2020   Procedure: COLONOSCOPY WITH PROPOFOL ;  Surgeon: Irby Mannan, MD;  Location: [facility] ENDOSCOPY;  Service: Endoscopy;  Laterality: N/A;  COVID POSITIVE 02/29/2020   CORONARY/GRAFT ACUTE MI REVASCULARIZATION N/A 01/23/2022   Procedure: Coronary/Graft Acute MI Revascularization;  Surgeon: Sammy Crisp, MD;  Location: [facility] INVASIVE CV LAB;  Service: Cardiovascular;  Laterality: N/A;   ESOPHAGOGASTRODUODENOSCOPY (EGD) WITH PROPOFOL  N/A 03/30/2020   Procedure: ESOPHAGOGASTRODUODENOSCOPY (EGD) WITH PROPOFOL ;  Surgeon: Irby Mannan, MD;  Location: [facility] ENDOSCOPY;  Service: Endoscopy;  Laterality: N/A;   ESOPHAGOGASTRODUODENOSCOPY (EGD) WITH PROPOFOL  N/A 08/03/2020   Procedure: ESOPHAGOGASTRODUODENOSCOPY (EGD) WITH PROPOFOL ;  Surgeon: Irby Mannan, MD;  Location: [facility]  ENDOSCOPY;  Service: Endoscopy;  Laterality: N/A;   ESOPHAGOGASTRODUODENOSCOPY (EGD) WITH PROPOFOL  N/A 01/25/2021   Procedure: ESOPHAGOGASTRODUODENOSCOPY (EGD) WITH PROPOFOL ;  Surgeon: Irby Mannan, MD;  Location: [facility] ENDOSCOPY;  Service: Endoscopy;  Laterality: N/A;   ESOPHAGOGASTRODUODENOSCOPY (EGD) WITH PROPOFOL  N/A 09/05/2021   Procedure: ESOPHAGOGASTRODUODENOSCOPY (EGD) WITH PROPOFOL ;  Surgeon: Selena Daily, MD;  Location: [facility] ENDOSCOPY;  Service: Gastroenterology;  Laterality: N/A;   EXCISION MASS NECK Left 01/18/2019   Procedure: EXCISION MASS NECK/NODE;  Surgeon: Mellody Sprout, MD;  Location: [facility] ORS;  Service: ENT;  Laterality: Left;   IR REMOVAL TUN ACCESS W/ PORT W/O FL MOD SED  12/05/2021   LARYNGOSCOPY Bilateral 02/22/2019   Procedure: MICROSCOPIC DIRECT LARYNGOSCOPY AND BIOPSY;  Surgeon: Mellody Sprout, MD;  Location: [facility] ORS;  Service: ENT;  Laterality: Bilateral;   LEFT HEART CATH AND CORONARY ANGIOGRAPHY N/A 01/23/2022   Procedure: LEFT HEART CATH AND CORONARY ANGIOGRAPHY;  Surgeon: Sammy Crisp, MD;  Location: [facility] INV[facility] CV [facility];  Service: Cardiovascular;  Laterality: N/A;   PORTA CATH INSERTION N/A 03/24/2019   Procedure: PORTA CATH INSERTION;  Surgeon: Jackquelyn Mass, MD;  Location: [facility] INVASIVE CV LAB;  Service: Cardiovascular;  Laterality: N/A;   TONSILLECTOMY     TUBAL LIGATION      Current Medications: Current Meds  Medication Sig  ALPRAZolam  (XANAX ) 1 MG tablet Take 1 tablet (1 mg total) by mouth 2 (two) times daily as needed for anxiety.   aspirin  81 MG chewable tablet Chew 1 tablet (81 mg total) by mouth daily.   atorvastatin  (LIPITOR ) 80 MG tablet TAKE 1 TABLET BY MOUTH EVERY DAY   BRILINTA  90 MG TABS tablet TAKE 1 TABLET BY MOUTH 2 TIMES DAILY.   buPROPion  (WELLBUTRIN  SR) 150 MG 12 hr tablet Take 1 tablet (150 mg total) by mouth 2 (two) times daily.   Docusate Sodium (DSS) 100 MG CAPS Take 1 capsule by mouth every other day.    gabapentin  (NEURONTIN ) 300 MG capsule Take 1 capsule (300 mg total) by mouth 2 (two) times daily.   isosorbide  mononitrate (IMDUR ) 30 MG 24 hr tablet TAKE 1/2 OF A TABLET (15 MG TOTAL) BY MOUTH DAILY   meclizine  (ANTIVERT ) 12.5 MG tablet Take 1 tablet (12.5 mg total) by mouth 3 (three) times daily as needed for dizziness or nausea.   methocarbamol (ROBAXIN) 500 MG tablet Take 500 mg by mouth at bedtime.   metoprolol  succinate (TOPROL -XL) 25 MG 24 hr tablet TAKE 0.5 TABLETS BY MOUTH DAILY. TAKE WITH OR IMMEDIATELY FOLLOWING A MEAL.   omeprazole  (PRILOSEC) 40 MG capsule Take 1 capsule (40 mg total) by mouth 2 (two) times daily before a meal.   Vitamin D, Ergocalciferol, (DRISDOL) 1.25 MG (50000 UNIT) CAPS capsule Take 50,000 Units by mouth once a week.    Allergies:   Chlorhexidine    Social History   Socioeconomic History   Marital status: Single    Spouse name: Not on file   Number of children: Not on file   Years of education: Not on file   Highest education level: Not on file  Occupational History   Not on file  Tobacco Use   Smoking status: Never    Passive exposure: Never   Smokeless tobacco: Never  Vaping Use   Vaping status: Never Used  Substance and Sexual Activity   Alcohol use: Not Currently   Drug use: No   Sexual activity: Not on file  Other Topics Concern   Not on file  Social History Narrative   Not on file   Social Drivers of Health   Financial Resource Strain: Not on file  Food Insecurity: No Food Insecurity (03/04/2023)   Hunger Vital Sign    Worried About Running Out of Food in the Last Year: Never true    Ran Out of Food in the Last Year: Never true  Transportation Needs: No Transportation Needs (03/04/2023)   PRAPARE - Administrator, Civil Service (Medical): No    Lack of Transportation (Non-Medical): No  Physical Activity: Not on file  Stress: Not on file  Social Connections: Not on file     Family History:  The patient's family  history includes Brain cancer in her paternal grandfather; Breast cancer in her sister.  ROS:   12-point review of systems is negative unless otherwise noted in the HPI.   EKGs/Labs/Other Studies Reviewed:    Studies reviewed were summarized above. The additional studies were reviewed today:  2D echo 01/23/2022: 1. Left ventricular ejection fraction, by estimation, is 60 to 65%. The  left ventricle has normal function. The left ventricle has no regional  wall motion abnormalities. There is mild left ventricular hypertrophy.  Left ventricular diastolic parameters  were normal.   2. Right ventricular systolic function is normal. The right ventricular  size is not well visualized.  3. The mitral valve is normal in structure. Mild mitral valve  regurgitation.   4. The aortic valve is tricuspid. Aortic valve regurgitation is not  visualized. Aortic valve sclerosis is present, with no evidence of aortic  valve stenosis.  __________   LHC 01/23/2022: Conclusions: Severe single-vessel coronary artery disease with 99% mid RCA stenosis, likely due to acute plaque rupture, with TIMI-2 flow.  There is also a hazy 50-60% proximal RCA stenosis as well as moderate to severe but not critical left coronary artery disease as detailed below. Normal left ventricular filling pressure (LVEDP 12 mmHg). Successful PCI to proximal and mid RCA stenoses using nonoverlapping Onyx Frontier 3.0 x 12 mm (proximal) and 2.75 x 18 mm (mid) drug-eluting stents with 0% residual stenosis and TIMI-3 flow.  Jailed RV marginal branch arising from the mid RCA demonstrates 70% stenosis pre-PCI, increased to 90% plaque shift.  There is TIMI-2 flow at the end of the procedure through this small branch, which is too small for intervention.   Recommendations: Admit to ICU for post STEMI/PCI monitoring. Dual antiplatelet therapy with aspirin  and ticagrelor  for at least 12 months. Aggressive secondary prevention of coronary  artery disease. Obtain echocardiogram. Favor medical therapy of the left coronary artery disease, as the most severe stenoses involve small/distal branches. __________   Limited echo 05/29/2022: 1. Left ventricular ejection fraction, by estimation, is 60 to 65%. The  left ventricle has normal function. The left ventricle has no regional  wall motion abnormalities. Left ventricular diastolic parameters were  normal. The average left ventricular  global longitudinal strain is -17.9 %. The global longitudinal strain is  normal.   2. Right ventricular systolic function is normal. The right ventricular  size is normal.   3. The mitral valve is normal in structure. No evidence of mitral valve  regurgitation.   4. The aortic valve is tricuspid. Aortic valve regurgitation is not  visualized.   5. The inferior vena cava is normal in size with greater than 50%  respiratory variability, suggesting right atrial pressure of 3 mmHg.   Comparison(s): 01/23/22 60-65%.  __________   Carotid artery ultrasound 05/29/2022: Summary:  Right Carotid: There was no evidence of thrombus, dissection,  atherosclerotic plaque or stenosis in the cervical carotid system.   Left Carotid: There was no evidence of thrombus, dissection,  atherosclerotic plaque or stenosis in the cervical carotid system.   Vertebrals:  Bilateral vertebral arteries demonstrate antegrade flow.  Subclavians: Normal flow hemodynamics were seen in bilateral subclavian arteries. __________   Myocardial PET/CT 03/06/2023:   LV perfusion is normal. There is no evidence of ischemia. There is no evidence of infarction.   Rest left ventricular function is normal. Rest EF: 61%. Stress left ventricular function is normal. Stress EF: 79%. End diastolic cavity size is normal.   Myocardial blood flow was computed to be 0.3ml/g/min at rest and 2.28ml/g/min at stress. Global myocardial blood flow reserve was 2.94 and was normal.   Coronary calcium   assessment not performed due to prior revascularization.   The study is normal. The study is low risk.   EKG:  EKG is ordered today.  The EKG ordered today demonstrates NSR, 70 bpm, no acute ST/T changes  Recent Labs: 05/30/2023: TSH 4.710 06/18/2023: ALT 14; BUN 21; Creatinine, Ser 1.20; Hemoglobin 10.5; Platelets 205; Potassium 3.8; Sodium 142  Recent Lipid Panel    Component Value Date/Time   CHOL 117 07/19/2022 1109   TRIG 99 07/19/2022 1109   HDL 45 07/19/2022 1109  CHOLHDL 2.6 07/19/2022 1109   VLDL 20 07/19/2022 1109   LDLCALC 52 07/19/2022 1109    PHYSICAL EXAM:    VS:  BP (!) 138/92   Pulse 70   Ht 5' 2 (1.575 m)   Wt 171 lb 12.8 oz (77.9 kg)   SpO2 98%   BMI 31.42 kg/m   BMI: Body mass index is 31.42 kg/m.  Physical Exam Vitals reviewed.  Constitutional:      Appearance: She is well-developed.  HENT:     Head: Normocephalic and atraumatic.   Eyes:     General:        Right eye: No discharge.        Left eye: No discharge.    Cardiovascular:     Rate and Rhythm: Normal rate and regular rhythm.     Heart sounds: Normal heart sounds, S1 normal and S2 normal. Heart sounds not distant. No midsystolic click and no opening snap. No murmur heard.    No friction rub.  Pulmonary:     Effort: Pulmonary effort is normal. No respiratory distress.     Breath sounds: Normal breath sounds. No decreased breath sounds, wheezing, rhonchi or rales.  Chest:     Chest wall: No tenderness.   Musculoskeletal:     Cervical back: Normal range of motion.   Skin:    General: Skin is warm and dry.     Nails: There is no clubbing.   Neurological:     Mental Status: She is alert and oriented to person, place, and time.   Psychiatric:        Speech: Speech normal.        Behavior: Behavior normal.        Thought Content: Thought content normal.        Judgment: Judgment normal.     Wt Readings from Last 3 Encounters:  07/17/23 171 lb 12.8 oz (77.9 kg)  06/23/23 172  lb (78 kg)  06/18/23 180 lb (81.6 kg)     ASSESSMENT & PLAN:   CAD involving the native coronary arteries without angina: She is without symptoms of angina or cardiac decompensation.  Recent myocardial PET/CT showed no evidence of infarction or ischemia.  Continue aggressive risk factor modification and secondary prevention including aspirin  and ticagrelor . In follow-up, consider reduction in antiplatelet therapy.  She otherwise remains on atorvastatin  80 mg, Imdur  15 mg, and Toprol -XL 12.5 mg.  NSVT: Quiescent.  In the setting of acute MI.  Remains on Toprol -XL.  HLD: LDL 52 in 07/2022 with normal AST and ALT in 02/2023.  Remains on atorvastatin  80 mg.  Preprocedure cardiac risk stratification/dysphagia: Patient may need to undergo EGD for dysphagia with a history of prior dilatation of a Schatzki's ring in 2022.  Per Duke Activity Status Index, she can achieve > 4 METs without cardiac limitation.  Per Revised Cardiac Risk Index, she has an estimated rate of 6% for adverse cardiac event in the periprocedural timeframe.  She is without symptoms of angina or cardiac decompensation.  Suspect syncopal episode was in the setting of THC usage and cigar as she does not typically smoke THC.  Cannot exclude possible vagal event in the setting of dysphagia with associated emesis as well.  Recent myocardial PET/CT low risk without evidence of ischemia.  She may proceed with low risk noncardiac procedure without further cardiac testing.  Okay to hold ticagrelor  for 5 days prior to EGD with recommendation to resume as soon as safely possible thereafter.  She should continue aspirin  81 mg daily throughout the periprocedural timeframe.     Disposition: F/u with Dr. Nolan Battle or an APP in 6 months.   Medication Adjustments/Labs and Tests Ordered: Current medicines are reviewed at length with the patient today.  Concerns regarding medicines are outlined above. Medication changes, Labs and Tests ordered today are  summarized above and listed in the Patient Instructions accessible in Encounters.   Signed, Varney Gentleman, PA-C 07/17/2023 1:24 PM     [facility] [place] [address] [place], [place] [address] [phone]

## 2023-07-17 ENCOUNTER — Encounter: Payer: Self-pay | Admitting: Physician Assistant

## 2023-07-17 ENCOUNTER — Ambulatory Visit: Attending: Physician Assistant | Admitting: Physician Assistant

## 2023-07-17 VITALS — BP 138/92 | HR 70 | Ht 62.0 in | Wt 171.8 lb

## 2023-07-17 DIAGNOSIS — I4729 Other ventricular tachycardia: Secondary | ICD-10-CM | POA: Diagnosis present

## 2023-07-17 DIAGNOSIS — R1319 Other dysphagia: Secondary | ICD-10-CM | POA: Insufficient documentation

## 2023-07-17 DIAGNOSIS — Z01810 Encounter for preprocedural cardiovascular examination: Secondary | ICD-10-CM | POA: Diagnosis present

## 2023-07-17 DIAGNOSIS — I2510 Atherosclerotic heart disease of native coronary artery without angina pectoris: Secondary | ICD-10-CM | POA: Diagnosis present

## 2023-07-17 DIAGNOSIS — E785 Hyperlipidemia, unspecified: Secondary | ICD-10-CM | POA: Diagnosis present

## 2023-07-17 NOTE — Patient Instructions (Signed)
 Medication Instructions:   Your physician recommends that you continue on your current medications as directed. Please refer to the Current Medication list given to you today.   *If you need a refill on your cardiac medications before your next appointment, please call your pharmacy*  Lab Work:  No labs ordered today   If you have labs (blood work) drawn today and your tests are completely normal, you will receive your results only by: MyChart Message (if you have MyChart) OR A paper copy in the mail If you have any lab test that is abnormal or we need to change your treatment, we will call you to review the results.  Testing/Procedures:  No test ordered today   Follow-Up: At [facility], you and your health needs are our priority.  As part of our continuing mission to provide you with exceptional heart care, our providers are all part of one team.  This team includes your primary Cardiologist (physician) and Advanced Practice Providers or APPs (Physician Assistants and Nurse Practitioners) who all work together to provide you with the care you need, when you need it.  Your next appointment:   6 month(s)  Provider:   Sammy Crisp, MD or Varney Gentleman, PA-C

## 2023-07-24 ENCOUNTER — Encounter
Admission: RE | Disposition: A | Discharge status: Home / Self Care | Payer: Self-pay | Source: Home / Self Care | Attending: Gastroenterology

## 2023-07-24 ENCOUNTER — Ambulatory Visit
Admission: RE | Admit: 2023-07-24 | Discharge: 2023-07-24 | Disposition: A | Discharge status: Home / Self Care | Attending: Gastroenterology | Admitting: Gastroenterology

## 2023-07-24 ENCOUNTER — Other Ambulatory Visit: Payer: Self-pay

## 2023-07-24 ENCOUNTER — Ambulatory Visit: Admitting: Anesthesiology

## 2023-07-24 ENCOUNTER — Encounter: Payer: Self-pay | Admitting: Gastroenterology

## 2023-07-24 DIAGNOSIS — Z85810 Personal history of malignant neoplasm of tongue: Secondary | ICD-10-CM | POA: Diagnosis not present

## 2023-07-24 DIAGNOSIS — K449 Diaphragmatic hernia without obstruction or gangrene: Secondary | ICD-10-CM

## 2023-07-24 DIAGNOSIS — K219 Gastro-esophageal reflux disease without esophagitis: Secondary | ICD-10-CM | POA: Insufficient documentation

## 2023-07-24 DIAGNOSIS — Z955 Presence of coronary angioplasty implant and graft: Secondary | ICD-10-CM | POA: Diagnosis not present

## 2023-07-24 DIAGNOSIS — R1314 Dysphagia, pharyngoesophageal phase: Secondary | ICD-10-CM | POA: Insufficient documentation

## 2023-07-24 DIAGNOSIS — I252 Old myocardial infarction: Secondary | ICD-10-CM | POA: Diagnosis not present

## 2023-07-24 DIAGNOSIS — I2510 Atherosclerotic heart disease of native coronary artery without angina pectoris: Secondary | ICD-10-CM | POA: Diagnosis not present

## 2023-07-24 HISTORY — PX: ESOPHAGOGASTRODUODENOSCOPY: SHX5428

## 2023-07-24 SURGERY — EGD (ESOPHAGOGASTRODUODENOSCOPY)
Anesthesia: General

## 2023-07-24 MED ORDER — PROPOFOL 10 MG/ML IV BOLUS
INTRAVENOUS | Status: DC | PRN
  Administered 2023-07-24: 70 mg via INTRAVENOUS

## 2023-07-24 MED ORDER — LIDOCAINE HCL (CARDIAC) PF 100 MG/5ML IV SOSY
PREFILLED_SYRINGE | INTRAVENOUS | Status: DC | PRN
  Administered 2023-07-24: 50 mg via INTRAVENOUS

## 2023-07-24 MED ORDER — SODIUM CHLORIDE 0.9 % IV SOLN: INTRAVENOUS | Status: DC

## 2023-07-24 MED ORDER — PROPOFOL 500 MG/50ML IV EMUL
INTRAVENOUS | Status: DC | PRN
  Administered 2023-07-24: 150 ug/kg/min via INTRAVENOUS

## 2023-07-24 NOTE — Anesthesia Preprocedure Evaluation (Addendum)
 Anesthesia Evaluation  Patient identified by MRN, date of birth, ID band Patient awake    Reviewed: Allergy & Precautions, H&P , NPO status , Patient's Chart, lab work & pertinent test results  Airway Mallampati: II  TM Distance: >3 FB Neck ROM: full    Dental  (+) Missing,    Pulmonary neg pulmonary ROS   Pulmonary exam normal        Cardiovascular + CAD, + Past MI (inferior ST elevation MI on 01/23/2022) and + Cardiac Stents  Normal cardiovascular exam  Myocardial PET/CT 03/06/2023:   LV perfusion is normal. There is no evidence of ischemia. There is no evidence of infarction.   Rest left ventricular function is normal. Rest EF: 61%. Stress left ventricular function is normal. Stress EF: 79%. End diastolic cavity size is normal.   Myocardial blood flow was computed to be 0.97ml/g/min at rest and 2.21ml/g/min at stress. Global myocardial blood flow reserve was 2.94 and was normal.   Coronary calcium  assessment not performed due to prior revascularization.   The study is normal. The study is low risk.   Limited echo 05/29/2022: 1. Left ventricular ejection fraction, by estimation, is 60 to 65%. The  left ventricle has normal function. The left ventricle has no regional  wall motion abnormalities. Left ventricular diastolic parameters were  normal. The average left ventricular  global longitudinal strain is -17.9 %. The global longitudinal strain is  normal.   2. Right ventricular systolic function is normal. The right ventricular  size is normal.   3. The mitral valve is normal in structure. No evidence of mitral valve  regurgitation.   4. The aortic valve is tricuspid. Aortic valve regurgitation is not  visualized.   5. The inferior vena cava is normal in size with greater than 50%  respiratory variability, suggesting right atrial pressure of 3 mmHg.   Comparison(s): 01/23/22 60-65%.    LHC  01/23/2022: Conclusions: 1. Severe single-vessel coronary artery disease with 99% mid RCA stenosis, likely due to acute plaque rupture, with TIMI-2 flow.  There is also a hazy 50-60% proximal RCA stenosis as well as moderate to severe but not critical left coronary artery disease as detailed below. 2. Normal left ventricular filling pressure (LVEDP 12 mmHg). 3. Successful PCI to proximal and mid RCA stenoses using nonoverlapping Onyx Frontier 3.0 x 12 mm (proximal) and 2.75 x 18 mm (mid) drug-eluting stents with 0% residual stenosis and TIMI-3 flow.  Jailed RV marginal branch arising from the mid RCA demonstrates 70% stenosis pre-PCI, increased to 90% plaque shift.  There is TIMI-2 flow at the end of the procedure through this small branch, which is too small for intervention    Neuro/Psych  PSYCHIATRIC DISORDERS Anxiety     negative neurological ROS     GI/Hepatic negative GI ROS, Neg liver ROS,,,  Endo/Other  negative endocrine ROS    Renal/GU negative Renal ROS  negative genitourinary   Musculoskeletal   Abdominal Normal abdominal exam  (+)   Peds  Hematology negative hematology ROS (+)   Anesthesia Other Findings Past Medical History: No date: Anxiety No date: Depression No date: GERD (gastroesophageal reflux disease) 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HCC) No date: Tongue cancer [facility])  Past Surgical History: 03/30/2020: COLONOSCOPY WITH PROPOFOL ; N/A     Comment:  Procedure: COLONOSCOPY WITH PROPOFOL ;  Surgeon:               Irby Mannan, MD;  Location: [facility] ENDOSCOPY;  Service: Endoscopy;  Laterality: N/A;  COVID POSITIVE               02/29/2020 01/23/2022: CORONARY/GRAFT ACUTE MI REVASCULARIZATION; N/A     Comment:  Procedure: Coronary/Graft Acute MI Revascularization;                Surgeon: Sammy Crisp, MD;  Location: [facility] INVASIVE               CV LAB;  Service: Cardiovascular;  Laterality: N/A; 03/30/2020: ESOPHAGOGASTRODUODENOSCOPY (EGD) WITH  PROPOFOL ; N/A     Comment:  Procedure: ESOPHAGOGASTRODUODENOSCOPY (EGD) WITH               PROPOFOL ;  Surgeon: Irby Mannan, MD;  Location:               [facility] ENDOSCOPY;  Service: Endoscopy;  Laterality: N/A; 08/03/2020: ESOPHAGOGASTRODUODENOSCOPY (EGD) WITH PROPOFOL ; N/A     Comment:  Procedure: ESOPHAGOGASTRODUODENOSCOPY (EGD) WITH               PROPOFOL ;  Surgeon: Irby Mannan, MD;  Location:               [facility] ENDOSCOPY;  Service: Endoscopy;  Laterality: N/A; 01/25/2021: ESOPHAGOGASTRODUODENOSCOPY (EGD) WITH PROPOFOL ; N/A     Comment:  Procedure: ESOPHAGOGASTRODUODENOSCOPY (EGD) WITH               PROPOFOL ;  Surgeon: Irby Mannan, MD;  Location:               [facility] ENDOSCOPY;  Service: Endoscopy;  Laterality: N/A; 09/05/2021: ESOPHAGOGASTRODUODENOSCOPY (EGD) WITH PROPOFOL ; N/A     Comment:  Procedure: ESOPHAGOGASTRODUODENOSCOPY (EGD) WITH               PROPOFOL ;  Surgeon: Selena Daily, MD;  Location:               [facility] ENDOSCOPY;  Service: Gastroenterology;  Laterality:               N/A; 01/18/2019: EXCISION MASS NECK; Left     Comment:  Procedure: EXCISION MASS NECK/NODE;  Surgeon: Juengel,               Paul, MD;  Location: [facility] ORS;  Service: ENT;                Laterality: Left; 12/05/2021: IR REMOVAL TUN ACCESS W/ PORT W/O FL MOD SED 02/22/2019: LARYNGOSCOPY; Bilateral     Comment:  Procedure: MICROSCOPIC DIRECT LARYNGOSCOPY AND BIOPSY;                Surgeon: Mellody Sprout, MD;  Location: [facility] ORS;                Service: ENT;  Laterality: Bilateral; 01/23/2022: LEFT HEART CATH AND CORONARY ANGIOGRAPHY; N/A     Comment:  Procedure: LEFT HEART CATH AND CORONARY ANGIOGRAPHY;                Surgeon: Sammy Crisp, MD;  Location: [facility] INVASIVE               CV LAB;  Service: Cardiovascular;  Laterality: N/A; 03/24/2019: PORTA CATH INSERTION; N/A     Comment:  Procedure: PORTA CATH INSERTION;  Surgeon: Jackquelyn Mass, MD;  Location:  [facility] INVASIVE CV LAB;  Service:              Cardiovascular;  Laterality: N/A; No date:  TONSILLECTOMY No date: TUBAL LIGATION     Reproductive/Obstetrics negative OB ROS                             Anesthesia Physical Anesthesia Plan  ASA: 3  Anesthesia Plan: General   Post-op Pain Management:    Induction:   PONV Risk Score and Plan: Propofol  infusion and TIVA  Airway Management Planned: Natural Airway  Additional Equipment:   Intra-op Plan:   Post-operative Plan:   Informed Consent: I have reviewed the patients History and Physical, chart, labs and discussed the procedure including the risks, benefits and alternatives for the proposed anesthesia with the patient or authorized representative who has indicated his/her understanding and acceptance.     Dental Advisory Given  Plan Discussed with: CRNA and Surgeon  Anesthesia Plan Comments:         Anesthesia Quick Evaluation

## 2023-07-24 NOTE — Anesthesia Postprocedure Evaluation (Signed)
 Anesthesia Post Note  Patient: MCKENZEY PARCELL  Procedure(s) Performed: EGD (ESOPHAGOGASTRODUODENOSCOPY)  Patient location during evaluation: Endoscopy Anesthesia Type: General Level of consciousness: awake and alert Pain management: pain level controlled Vital Signs Assessment: post-procedure vital signs reviewed and stable Respiratory status: spontaneous breathing, nonlabored ventilation and respiratory function stable Cardiovascular status: blood pressure returned to baseline and stable Postop Assessment: no apparent nausea or vomiting Anesthetic complications: no   No notable events documented.   Last Vitals:  Vitals:   07/24/23 0852 07/24/23 0902  BP: 113/89 (!) 119/94  Pulse: 73 69  Resp: 17 (!) 23  Temp:    SpO2: 94% 100%    Last Pain:  Vitals:   07/24/23 0902  TempSrc:   PainSc: 0-No pain                 Baltazar Bonier

## 2023-07-24 NOTE — H&P (Signed)
 Connie Oz, MD [address]  [address]  [place], [place] [address]  Main: [phone]  Fax: [phone] Pager: [phone]  Primary Care Physician:  Donette Furlong, MD Primary Gastroenterologist:  Dr. Karma Cameron  Pre-Procedure History & Physical: HPI:  Connie Cameron is a 63 y.o. female is here for an endoscopy.   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[facility])     Past Surgical History:  Procedure Laterality Date   COLONOSCOPY WITH PROPOFOL  N/A 03/30/2020   Procedure: COLONOSCOPY WITH PROPOFOL ;  Surgeon: Irby Mannan, MD;  Location: [facility];  Service: Endoscopy;  Laterality: N/A;  COVID POSITIVE 02/29/2020   CORONARY/GRAFT ACUTE MI REVASCULARIZATION N/A 01/23/2022   Procedure: Coronary/Graft Acute MI Revascularization;  Surgeon: Sammy Crisp, MD;  Location: [facility] CV LAB;  Service: Cardiovascular;  Laterality: N/A;   ESOPHAGOGASTRODUODENOSCOPY (EGD) WITH PROPOFOL  N/A 03/30/2020   Procedure: ESOPHAGOGASTRODUODENOSCOPY (EGD) WITH PROPOFOL ;  Surgeon: Irby Mannan, MD;  Location: [facility];  Service: Endoscopy;  Laterality: N/A;   ESOPHAGOGASTRODUODENOSCOPY (EGD) WITH PROPOFOL  N/A 08/03/2020   Procedure: ESOPHAGOGASTRODUODENOSCOPY (EGD) WITH PROPOFOL ;  Surgeon: Irby Mannan, MD;  Location: [facility];  Service: Endoscopy;  Laterality: N/A;   ESOPHAGOGASTRODUODENOSCOPY (EGD) WITH PROPOFOL  N/A 01/25/2021   Procedure: ESOPHAGOGASTRODUODENOSCOPY (EGD) WITH PROPOFOL ;  Surgeon: Irby Mannan, MD;  Location: [facility];  Service: Endoscopy;  Laterality: N/A;   ESOPHAGOGASTRODUODENOSCOPY (EGD) WITH PROPOFOL  N/A 09/05/2021   Procedure: ESOPHAGOGASTRODUODENOSCOPY (EGD) WITH PROPOFOL ;  Surgeon: Selena Daily, MD;  Location: [facility];  Service: Gastroenterology;  Laterality: N/A;   EXCISION MASS NECK Left  01/18/2019   Procedure: EXCISION MASS NECK/NODE;  Surgeon: Mellody Sprout, MD;  Location: [facility] ORS;  Service: ENT;  Laterality: Left;   IR REMOVAL TUN ACCESS W/ PORT W/O FL MOD SED  12/05/2021   LARYNGOSCOPY Bilateral 02/22/2019   Procedure: MICROSCOPIC DIRECT LARYNGOSCOPY AND BIOPSY;  Surgeon: Mellody Sprout, MD;  Location: [facility] ORS;  Service: ENT;  Laterality: Bilateral;   LEFT HEART CATH AND CORONARY ANGIOGRAPHY N/A 01/23/2022   Procedure: LEFT HEART CATH AND CORONARY ANGIOGRAPHY;  Surgeon: Sammy Crisp, MD;  Location: [facility] CV LAB;  Service: Cardiovascular;  Laterality: N/A;   PORTA CATH INSERTION N/A 03/24/2019   Procedure: PORTA CATH INSERTION;  Surgeon: Jackquelyn Mass, MD;  Location: [facility] CV LAB;  Service: Cardiovascular;  Laterality: N/A;   TONSILLECTOMY     TUBAL LIGATION      Prior to Admission medications   Medication Sig Start Date End Date Taking? Authorizing Provider  aspirin  81 MG chewable tablet Chew 1 tablet (81 mg total) by mouth daily. 01/26/22  Yes Magdalene School, MD  atorvastatin  (LIPITOR ) 80 MG tablet TAKE 1 TABLET BY MOUTH EVERY DAY 11/29/22  Yes End, Veryl Gottron, MD  BRILINTA  90 MG TABS tablet TAKE 1 TABLET BY MOUTH 2 TIMES DAILY. 11/29/22  Yes Dunn, Elvia Hammans, PA-C  gabapentin  (NEURONTIN ) 300 MG capsule Take 1 capsule (300 mg total) by mouth 2 (two) times daily. 03/06/23  Yes Ziglar, Susan K, MD  isosorbide  mononitrate (IMDUR ) 30 MG 24 hr tablet TAKE 1/2 OF A TABLET (15 MG TOTAL) BY MOUTH DAILY 07/03/23  Yes End, Veryl Gottron, MD  metoprolol  succinate (TOPROL -XL) 25 MG 24 hr tablet TAKE 0.5 TABLETS BY MOUTH DAILY. TAKE WITH OR IMMEDIATELY FOLLOWING A MEAL. 11/29/22  Yes Roark Chick,  PA-C  omeprazole  (PRILOSEC) 40 MG capsule Take 1 capsule (40 mg total) by mouth 2 (two) times daily before a meal. 06/23/23  Yes Mar Walmer, Elson Halon, MD  Vitamin D, Ergocalciferol, (DRISDOL) 1.25 MG (50000 UNIT) CAPS capsule Take 50,000 Units by mouth once a week. 06/16/20  Yes  [provider]  ALPRAZolam  (XANAX ) 1 MG tablet Take 1 tablet (1 mg total) by mouth 2 (two) times daily as needed for anxiety. 06/17/23   Ziglar, Susan K, MD  buPROPion  (WELLBUTRIN  SR) 150 MG 12 hr tablet Take 1 tablet (150 mg total) by mouth 2 (two) times daily. Patient not taking: Reported on 07/24/2023 03/04/23   Ziglar, Susan K, MD  Docusate Sodium (DSS) 100 MG CAPS Take 1 capsule by mouth every other day. 01/16/22   [provider]  hydrOXYzine  (ATARAX /VISTARIL ) 25 MG tablet Take 25 mg by mouth daily as needed for nausea, vomiting or anxiety. Patient not taking: Reported on 07/17/2023 06/15/20   [provider]  meclizine  (ANTIVERT ) 12.5 MG tablet Take 1 tablet (12.5 mg total) by mouth 3 (three) times daily as needed for dizziness or nausea. 06/30/20   Faith Homes, MD  methocarbamol (ROBAXIN) 500 MG tablet Take 500 mg by mouth at bedtime. 12/25/22   [provider]  sertraline  (ZOLOFT ) 100 MG tablet Take 100 mg by mouth daily. Patient not taking: Reported on 07/17/2023 06/04/21   [provider]    Allergies as of 07/18/2023 - Review Complete 07/17/2023  Allergen Reaction Noted   Chlorhexidine   06/29/2020    Family History  Problem Relation Age of Onset   Breast cancer Sister    Brain cancer Paternal Grandfather     Social History   Socioeconomic History   Marital status: Single    Spouse name: Not on file   Number of children: Not on file   Years of education: Not on file   Highest education level: Not on file  Occupational History   Not on file  Tobacco Use   Smoking status: Never    Passive exposure: Never   Smokeless tobacco: Never  Vaping Use   Vaping status: Never Used  Substance and Sexual Activity   Alcohol use: Not Currently   Drug use: No   Sexual activity: Not on file  Other Topics Concern   Not on file  Social History Narrative   Not on file   Social Drivers of Health   Financial Resource Strain: Not on file   Food Insecurity: No Food Insecurity (03/04/2023)   Hunger Vital Sign    Worried About Running Out of Food in the Last Year: Never true    Ran Out of Food in the Last Year: Never true  Transportation Needs: No Transportation Needs (03/04/2023)   PRAPARE - Administrator, Civil Service (Medical): No    Lack of Transportation (Non-Medical): No  Physical Activity: Not on file  Stress: Not on file  Social Connections: Not on file  Intimate Partner Violence: Not At Risk (03/04/2023)   Humiliation, Afraid, Rape, and Kick questionnaire    Fear of Current or Ex-Partner: No    Emotionally Abused: No    Physically Abused: No    Sexually Abused: No    Review of Systems: See HPI, otherwise negative ROS  Physical Exam: BP 126/83   Pulse 65   Temp (!) 97 F (36.1 C) (Temporal)   Resp 18   Ht 5' 2 (1.575 m)   Wt 80.4 kg  SpO2 98%   BMI 32.41 kg/m  General:   Alert,  pleasant and cooperative in NAD Head:  Normocephalic and atraumatic. Neck:  Supple; no masses or thyromegaly. Lungs:  Clear throughout to auscultation.    Heart:  Regular rate and rhythm. Abdomen:  Soft, nontender and nondistended. Normal bowel sounds, without guarding, and without rebound.   Neurologic:  Alert and  oriented x4;  grossly normal neurologically.  Impression/Plan: Connie Cameron is here for an endoscopy to be performed for dysphagia  Risks, benefits, limitations, and alternatives regarding  endoscopy have been reviewed with the patient.  Questions have been answered.  All parties agreeable.   Ellis Guys, MD  07/24/2023, 8:24 AM

## 2023-07-24 NOTE — Transfer of Care (Signed)
 Immediate Anesthesia Transfer of Care Note  Patient: Connie Cameron  Procedure(s) Performed: EGD (ESOPHAGOGASTRODUODENOSCOPY)  Patient Location: endo  Anesthesia Type:General  Level of Consciousness: drowsy  Airway & Oxygen Therapy: Patient Spontanous Breathing  Post-op Assessment: Report given to RN  Post vital signs: stable  Last Vitals:  Vitals Value Taken Time  BP    Temp    Pulse 71 07/24/23 08:41  Resp 19 07/24/23 08:41  SpO2 98 % 07/24/23 08:41  Vitals shown include unfiled device data.  Last Pain:  Vitals:   07/24/23 0722  TempSrc: Temporal  PainSc: 0-No pain         Complications: No notable events documented.

## 2023-07-25 ENCOUNTER — Encounter: Payer: Self-pay | Admitting: Gastroenterology

## 2023-07-25 NOTE — Op Note (Addendum)
 [facility] Gastroenterology Patient Name: Connie Cameron Procedure Date: 07/24/2023 8:22 AM MRN: [id number] Account #: [id number] Date of Birth: [birth year] Admit Type: Outpatient Age: 63 Room: [facility] ENDO ROOM 3 Gender: Female Note Status: Supervisor Override Instrument Name: Upper Endoscope [id number] Procedure:             Upper GI endoscopy Indications:           Esophageal dysphagia Providers:             Corinn Jess Brooklyn MD, MD Referring MD:          Corinn Jess Brooklyn MD, MD (Referring MD), Susan K.                         Ziglar (Referring MD) Medicines:             General Anesthesia Complications:         No immediate complications. Estimated blood loss: None. Procedure:             Pre-Anesthesia Assessment:                        - Prior to the procedure, a History and Physical was                         performed, and patient medications and allergies were                         reviewed. The patient is competent. The risks and                         benefits of the procedure and the sedation options and                         risks were discussed with the patient. All questions                         were answered and informed consent was obtained.                         Patient identification and proposed procedure were                         verified by the physician, the nurse, the                         anesthesiologist, the anesthetist and the technician                         in the pre-procedure area in the procedure room in the                         endoscopy suite. Mental Status Examination: alert and                         oriented. Airway Examination: normal oropharyngeal                         airway and neck mobility. Respiratory Examination:  clear to auscultation. CV Examination: normal.                         Prophylactic Antibiotics: The patient does not require                         prophylactic  antibiotics. Prior Anticoagulants: The                         patient has taken Brilinta  (ticagrelor ), last dose was                         5 days prior to procedure. ASA Grade Assessment: III -                         A patient with severe systemic disease. After                         reviewing the risks and benefits, the patient was                         deemed in satisfactory condition to undergo the                         procedure. The anesthesia plan was to use general                         anesthesia. Immediately prior to administration of                         medications, the patient was re-assessed for adequacy                         to receive sedatives. The heart rate, respiratory                         rate, oxygen saturations, blood pressure, adequacy of                         pulmonary ventilation, and response to care were                         monitored throughout the procedure. The physical                         status of the patient was re-assessed after the                         procedure.                        After obtaining informed consent, the endoscope was                         passed under direct vision. Throughout the procedure,                         the patient's blood pressure, pulse, and oxygen  saturations were monitored continuously. The Endoscope                         was introduced through the mouth, and advanced to the                         second part of duodenum. The upper GI endoscopy was                         accomplished without difficulty. The patient tolerated                         the procedure well. Findings:      The duodenal bulb and second portion of the duodenum were normal.      The entire examined stomach was normal.      A 3 cm hiatal hernia was found. The proximal extent of the gastric folds       (end of tubular esophagus) was 30 cm from the incisors. The hiatal       narrowing  was 33 cm from the incisors. The Z-line was 30 cm from the       incisors.      The gastroesophageal junction and examined esophagus were normal.      The cardia and gastric fundus were normal on retroflexion. Impression:            - Normal duodenal bulb and second portion of the                         duodenum.                        - Normal stomach.                        - 3 cm hiatal hernia.                        - Normal gastroesophageal junction and esophagus.                        - No specimens collected. Recommendation:        - Discharge patient to home (with escort).                        - Resume previous diet today.                        - Continue present medications.                        - Follow an antireflux regimen indefinitely.                        - Resume Brilinta  (ticagrelor ) today at prior dose.                         Refer to managing physician for further adjustment of                         therapy. Procedure Code(s):     --- Professional ---  56764, Esophagogastroduodenoscopy, flexible,                         transoral; diagnostic, including collection of                         specimen(s) by brushing or washing, when performed                         (separate procedure) Diagnosis Code(s):     --- Professional ---                        K44.9, Diaphragmatic hernia without obstruction or                         gangrene                        R13.14, Dysphagia, pharyngoesophageal phase CPT copyright 2022 [employer]. All rights reserved. The codes documented in this report are preliminary and upon coder review may  be revised to meet current compliance requirements. Corinn Jess Brooklyn MD, MD 07/25/2023 9:12:23 AM Number of Addenda: 0 Note Initiated On: 07/24/2023 8:22 AM Estimated Blood Loss:  Estimated blood loss: none.      [facility]

## 2023-07-31 ENCOUNTER — Other Ambulatory Visit: Payer: Self-pay | Admitting: Family Medicine

## 2023-08-01 ENCOUNTER — Inpatient Hospital Stay: Attending: Internal Medicine

## 2023-08-21 ENCOUNTER — Other Ambulatory Visit: Payer: Self-pay | Admitting: Internal Medicine

## 2023-08-26 ENCOUNTER — Ambulatory Visit: Payer: Self-pay

## 2023-08-26 ENCOUNTER — Other Ambulatory Visit: Payer: Self-pay | Admitting: Family Medicine

## 2023-08-26 MED ORDER — GABAPENTIN 300 MG PO CAPS: 300.0000 mg | ORAL_CAPSULE | Freq: Two times a day (BID) | ORAL | 0 refills | Status: DC

## 2023-08-26 NOTE — Telephone Encounter (Signed)
 Copied from CRM [id number]. Topic: Clinical - Medication Refill >> Aug 26, 2023 11:55 AM Tobias L wrote: Medication:  gabapentin  (NEURONTIN ) 300 MG capsule Patient requested refill from pharmacy about 3 weeks ago and pharmacy has not heard from office. Requesting refill.   Has the patient contacted their pharmacy? Yes Told to contact office.   This is the patient's preferred pharmacy:  [facility] [address] [place] - [address] [place] [address] Phone: [phone] Fax: [phone]  Is this the correct pharmacy for this prescription? Yes   Has the prescription been filled recently? No  Is the patient out of the medication? Yes  Has the patient been seen for an appointment in the last year OR does the patient have an upcoming appointment? Yes  Can we respond through MyChart? Yes  Agent: Please be advised that Rx refills may take up to 3 business days. We ask that you follow-up with your pharmacy.

## 2023-08-26 NOTE — Telephone Encounter (Signed)
 FYI Only or Action Required?: Action required by provider: states Wellbutrin  was giving her a headache and would now like to start back on Zoloft .  Patient was last seen in primary care on 06/17/2023 by Ziglar, Susan K, MD.  Called Nurse Triage reporting Headache.  Symptoms began several days ago.  Interventions attempted: Nothing.  Symptoms are: completely resolved.  Triage Disposition: Home Care  Patient/caregiver understands and will follow disposition?: Yes     Copied from CRM [id number]. Topic: Clinical - Prescription Issue >> Aug 26, 2023 11:57 AM Tobias CROME wrote: Reason for CRM: Patient states the wellbutrin  was giving her headaches and bad dreams. Patient states she has stopped taking and it would like to go back to previous prescription.   Please assist pt further, [phone] Reason for Disposition  Headache  Answer Assessment - Initial Assessment Questions 1. LOCATION: Where does it hurt?      Top of head 2. ONSET: When did the headache start? (e.g., minutes, hours, days)      After a couple of weeks after taking wellbutrin . And off the wall dreams 3. PATTERN: Does the pain come and go, or has it been constant since it started?     constant 4. SEVERITY: How bad is the pain? and What does it keep you from doing?  (e.g., Scale 1-10; mild, moderate, or severe)     Stopped taking Wellburtin and headaches have stopped 5. RECURRENT SYMPTOM: Have you ever had headaches before? If Yes, ask: When was the last time? and What happened that time?      na 6. CAUSE: What do you think is causing the headache?     Wellbutrin , would like to go back on zoloft .  7. MIGRAINE: Have you been diagnosed with migraine headaches? If Yes, ask: Is this headache similar?      na 8. HEAD INJURY: Has there been any recent injury to your head?      na 9. OTHER SYMPTOMS: Do you have any other symptoms? (e.g., fever, stiff neck, eye pain, sore throat, cold symptoms)      na 10. PREGNANCY: Is there any chance you are pregnant? When was your last menstrual period?       na  Protocols used: Headache-A-AH

## 2023-09-01 ENCOUNTER — Inpatient Hospital Stay: Attending: Internal Medicine

## 2023-09-15 ENCOUNTER — Other Ambulatory Visit: Payer: Self-pay | Admitting: Family Medicine

## 2023-09-17 ENCOUNTER — Ambulatory Visit: Admitting: Family Medicine

## 2023-09-24 ENCOUNTER — Ambulatory Visit: Admitting: Family Medicine

## 2023-10-02 ENCOUNTER — Inpatient Hospital Stay: Attending: Internal Medicine

## 2023-10-02 DIAGNOSIS — E538 Deficiency of other specified B group vitamins: Secondary | ICD-10-CM | POA: Insufficient documentation

## 2023-10-02 DIAGNOSIS — D701 Agranulocytosis secondary to cancer chemotherapy: Secondary | ICD-10-CM

## 2023-10-02 MED ORDER — CYANOCOBALAMIN 1000 MCG/ML IJ SOLN
1000.0000 ug | Freq: Once | INTRAMUSCULAR | Status: AC
  Administered 2023-10-02: 1000 ug via INTRAMUSCULAR
  Filled 2023-10-02: qty 1

## 2023-10-03 ENCOUNTER — Encounter: Payer: Self-pay | Admitting: Family Medicine

## 2023-10-03 ENCOUNTER — Ambulatory Visit: Admitting: Family Medicine

## 2023-10-03 VITALS — BP 123/86 | HR 69 | Ht 62.0 in | Wt 187.0 lb

## 2023-10-03 DIAGNOSIS — E538 Deficiency of other specified B group vitamins: Secondary | ICD-10-CM | POA: Diagnosis not present

## 2023-10-03 DIAGNOSIS — F419 Anxiety disorder, unspecified: Secondary | ICD-10-CM

## 2023-10-03 MED ORDER — METFORMIN HCL ER 500 MG PO TB24: 500.0000 mg | ORAL_TABLET | Freq: Every day | ORAL | 3 refills | Status: DC

## 2023-10-03 MED ORDER — SERTRALINE HCL 50 MG PO TABS: 50.0000 mg | ORAL_TABLET | Freq: Every day | ORAL | 3 refills | Status: DC

## 2023-10-03 MED ORDER — ALPRAZOLAM 1 MG PO TABS
1.0000 mg | ORAL_TABLET | Freq: Two times a day (BID) | ORAL | 2 refills | Status: DC | PRN
Start: 2023-10-14 — End: 2023-11-03

## 2023-10-03 NOTE — Progress Notes (Signed)
 Established Patient Office Visit  Subjective   Patient ID: Connie Cameron, female    DOB: [birth year]  Age: 63 y.o. MRN: [id number]  Chief Complaint  Patient presents with   Medical Management of Chronic Issues    Pt is here for a follow up appt for medications. Pt wants to discuss changing her antidepressants. Has not taken meds in about a month due to side effects she was having.    HPI Discussed the use of AI scribe software for clinical note transcription with the patient, who gave verbal consent to proceed.  History of Present Illness   Connie Cameron is a 63 year old female with coronary artery disease who presents with concerns about rapid weight gain.  She has been experiencing rapid weight gain despite maintaining an active lifestyle and a diet rich in salads and vegetables. She has been caring for her grandson for three weeks, which keeps her busy, and she uses an exercise bike and walks about a mile daily. She has reduced her intake of bread and pasta. Despite these efforts, she continues to gain weight, causing her clothes to become tight and leading to distress.  She has a history of tongue cancer. She is concerned about the impact of weight gain on her heart, especially since she had stents placed in 2023. She is taking B12 shots, with the most recent one administered yesterday at the cancer center.  She experiences shortness of breath when walking to the mailbox and back but does not need to rest after showering. She previously had severe epigastric pain described as 'felt like a hot poker' and vomiting after eating. She consulted a gastroenterologist who prescribed a PPI to be taken an hour before eating, which has been effective. She underwent a scope which was normal.  Her medication regimen includes Xanax , which was increased from 0.5 mg to 1 mg twice daily, and she reports it is working well. She was switched from Zoloft  to Wellbutrin  but discontinued Wellbutrin  over a  month ago due to severe headaches and vivid dreams. She is interested in returning to Zoloft , which she previously took at a dose of 25 mg. She also takes gabapentin  twice daily for tingling and neuropathy in her arms and hands, which she finds effective.  No chest pain during physical activity. She confirms taking cholesterol medication and a daily aspirin . She is concerned about her skin and has not seen a dermatologist recently. She is interested in weight loss options but has not been diagnosed with diabetes and has not taken metformin  before.      ROS    Objective:     BP 123/86 (BP Location: Left Arm, Patient Position: Sitting, Cuff Size: Normal)   Pulse 69   Ht 5' 2 (1.575 m)   Wt 187 lb (84.8 kg)   SpO2 96%   BMI 34.20 kg/m    Physical Exam Vitals and nursing note reviewed.  Constitutional:      Appearance: Normal appearance.  HENT:     Head: Normocephalic and atraumatic.  Eyes:     Conjunctiva/sclera: Conjunctivae normal.  Cardiovascular:     Rate and Rhythm: Normal rate and regular rhythm.  Pulmonary:     Effort: Pulmonary effort is normal.     Breath sounds: Normal breath sounds.  Musculoskeletal:     Right lower leg: No edema.     Left lower leg: No edema.  Skin:    General: Skin is warm and dry.  Neurological:  Mental Status: She is alert and oriented to person, place, and time.  Psychiatric:        Mood and Affect: Mood normal.        Behavior: Behavior normal.        Thought Content: Thought content normal.        Judgment: Judgment normal.          No results found for any visits on 10/03/23.    The ASCVD Risk score (Arnett DK, et al., 2019) failed to calculate for the following reasons:   Risk score cannot be calculated because patient has a medical history suggesting prior/existing ASCVD    Assessment & Plan:  Anxiety Assessment & Plan: Refilled her xana 1mg  BID PRN for 3 months.  Did not like Wellbutrin .  She stopped taking it.   Would like to get back on Zoloft  but 25 mg is probably not enough so we will increase to 50 mg daily.  Follow-up in a month.  Orders: -     ALPRAZolam ; Take 1 tablet (1 mg total) by mouth 2 (two) times daily as needed for anxiety.  Dispense: 60 tablet; Refill: 2 -     Sertraline  HCl; Take 1 tablet (50 mg total) by mouth daily.  Dispense: 30 tablet; Refill: 3  Morbid obesity (HCC) Assessment & Plan: Discussed treatment options.  Her insurance will not cover a GLP-1.  Phentermine is not a good medicine for her as she has a history of CAD and hypertension.  Discussed Topamax but she is concerned about the side effect profile.  Will trial metformin  XL 500 mg 1 p.o. q. breakfast.  Follow-up in a month.  Orders: -     metFORMIN  HCl ER; Take 1 tablet (500 mg total) by mouth daily with breakfast.  Dispense: 30 tablet; Refill: 3  B12 deficiency Assessment & Plan: Gets B12 injections at the cancer center.        Return in about 4 weeks (around 10/31/2023).    Kemonie Cutillo K Shaela Boer, MD

## 2023-10-03 NOTE — Assessment & Plan Note (Addendum)
 Refilled her xana 1mg  BID PRN for 3 months.  Did not like Wellbutrin .  She stopped taking it.  Would like to get back on Zoloft  but 25 mg is probably not enough so we will increase to 50 mg daily.  Follow-up in a month.

## 2023-10-03 NOTE — Assessment & Plan Note (Signed)
 Discussed treatment options.  Her insurance will not cover a GLP-1.  Phentermine is not a good medicine for her as she has a history of CAD and hypertension.  Discussed Topamax but she is concerned about the side effect profile.  Will trial metformin  XL 500 mg 1 p.o. q. breakfast.  Follow-up in a month.

## 2023-10-03 NOTE — Assessment & Plan Note (Signed)
 Gets B12 injections at the cancer center.

## 2023-10-09 ENCOUNTER — Encounter: Payer: Self-pay | Admitting: Oncology

## 2023-10-09 ENCOUNTER — Ambulatory Visit
Admission: RE | Admit: 2023-10-09 | Discharge: 2023-10-09 | Disposition: A | Discharge status: Home / Self Care | Source: Ambulatory Visit | Attending: Family Medicine | Admitting: Family Medicine

## 2023-10-09 DIAGNOSIS — Z1231 Encounter for screening mammogram for malignant neoplasm of breast: Secondary | ICD-10-CM | POA: Diagnosis present

## 2023-10-13 ENCOUNTER — Other Ambulatory Visit: Payer: Self-pay | Admitting: Family Medicine

## 2023-10-15 ENCOUNTER — Other Ambulatory Visit: Payer: Self-pay | Admitting: Family Medicine

## 2023-10-15 DIAGNOSIS — F419 Anxiety disorder, unspecified: Secondary | ICD-10-CM

## 2023-10-16 ENCOUNTER — Encounter: Payer: Self-pay | Admitting: Oncology

## 2023-10-16 ENCOUNTER — Other Ambulatory Visit: Payer: Self-pay | Admitting: *Deleted

## 2023-10-16 ENCOUNTER — Inpatient Hospital Stay
Admission: RE | Admit: 2023-10-16 | Discharge: 2023-10-16 | Disposition: A | Discharge status: Home / Self Care | Payer: Self-pay | Source: Ambulatory Visit | Attending: Family Medicine | Admitting: Family Medicine

## 2023-10-16 DIAGNOSIS — Z1231 Encounter for screening mammogram for malignant neoplasm of breast: Secondary | ICD-10-CM

## 2023-10-23 ENCOUNTER — Encounter: Payer: Self-pay | Admitting: Oncology

## 2023-10-23 ENCOUNTER — Other Ambulatory Visit: Payer: Self-pay | Admitting: Student

## 2023-10-23 DIAGNOSIS — R490 Dysphonia: Secondary | ICD-10-CM

## 2023-10-23 DIAGNOSIS — H9209 Otalgia, unspecified ear: Secondary | ICD-10-CM

## 2023-10-27 ENCOUNTER — Other Ambulatory Visit: Payer: Self-pay | Admitting: Family Medicine

## 2023-10-27 DIAGNOSIS — F419 Anxiety disorder, unspecified: Secondary | ICD-10-CM

## 2023-10-29 ENCOUNTER — Encounter: Payer: Self-pay | Admitting: Oncology

## 2023-10-29 ENCOUNTER — Ambulatory Visit
Admission: RE | Admit: 2023-10-29 | Discharge: 2023-10-29 | Disposition: A | Discharge status: Home / Self Care | Source: Ambulatory Visit | Attending: Student | Admitting: Student

## 2023-10-29 DIAGNOSIS — H9209 Otalgia, unspecified ear: Secondary | ICD-10-CM

## 2023-10-29 DIAGNOSIS — R490 Dysphonia: Secondary | ICD-10-CM

## 2023-10-29 MED ORDER — IOPAMIDOL (ISOVUE-300) INJECTION 61%
75.0000 mL | Freq: Once | INTRAVENOUS | Status: AC | PRN
Start: 2023-10-29 — End: 2023-10-29
  Administered 2023-10-29: 75 mL via INTRAVENOUS
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
## 2023-10-31 ENCOUNTER — Encounter: Payer: Self-pay | Admitting: Physician Assistant

## 2023-10-31 ENCOUNTER — Ambulatory Visit: Attending: Physician Assistant | Admitting: Physician Assistant

## 2023-10-31 ENCOUNTER — Ambulatory Visit

## 2023-10-31 VITALS — BP 128/78 | HR 67 | Wt 179.0 lb

## 2023-10-31 DIAGNOSIS — R55 Syncope and collapse: Secondary | ICD-10-CM | POA: Diagnosis not present

## 2023-10-31 DIAGNOSIS — E785 Hyperlipidemia, unspecified: Secondary | ICD-10-CM

## 2023-10-31 DIAGNOSIS — I4729 Other ventricular tachycardia: Secondary | ICD-10-CM

## 2023-10-31 DIAGNOSIS — I2510 Atherosclerotic heart disease of native coronary artery without angina pectoris: Secondary | ICD-10-CM

## 2023-10-31 NOTE — Telephone Encounter (Signed)
 Copied from CRM [id number]. Topic: Clinical - Medication Refill >> Oct 31, 2023  1:56 PM Antony S wrote: Medication:   ALPRAZolam  (XANAX ) 1 MG tablet  sertraline  (ZOLOFT ) 50 MG tablet (suppose to be 100mg ?) gabapentin  (NEURONTIN ) 300 MG capsule   Has the patient contacted their pharmacy? Yes (Agent: If no, request that the patient contact the pharmacy for the refill. If patient does not wish to contact the pharmacy document the reason why and proceed with request.) (Agent: If yes, when and what did the pharmacy advise?)  This is the patient's preferred pharmacy:  [facility] [address], [place] - [address] [place] [address] Phone: [phone] Fax: [phone]  Is this the correct pharmacy for this prescription? Yes If no, delete pharmacy and type the correct one.   Has the prescription been filled recently? No  Is the patient out of the medication? Yes  Has the patient been seen for an appointment in the last year OR does the patient have an upcoming appointment? Yes  Can we respond through MyChart? Yes  Agent: Please be advised that Rx refills may take up to 3 business days. We ask that you follow-up with your pharmacy.

## 2023-10-31 NOTE — Patient Instructions (Signed)
 Medication Instructions:  Your physician recommends that you continue on your current medications as directed. Please refer to the Current Medication list given to you today.   *If you need a refill on your cardiac medications before your next appointment, please call your pharmacy*  Lab Work: No labs ordered today  If you have labs (blood work) drawn today and your tests are completely normal, you will receive your results only by: MyChart Message (if you have MyChart) OR A paper copy in the mail If you have any lab test that is abnormal or we need to change your treatment, we will call you to review the results.  Testing/Procedures: ZIO AT Long term monitor-Live Telemetry  Your physician has requested you wear a ZIO patch monitor for 14 days.  This is a single patch monitor. [employer] supplies one patch monitor per enrollment. Additional  stickers are not available.  Please do not apply patch if you will be having a Nuclear Stress Test, Echocardiogram, Cardiac CT, MRI,  or Chest Xray during the period you would be wearing the monitor. The patch cannot be worn during  these tests. You cannot remove and re-apply the ZIO AT patch monitor.  Your ZIO patch monitor will be mailed 3 day USPS to your address on file. It may take 3-5 days to  receive your monitor after you have been enrolled.  Once you have received your monitor, please review the enclosed instructions. Your monitor has  already been registered assigning a specific monitor serial # to you.   Billing and Patient Assistance Program information  M[place] has been supplied with any insurance information on record for billing. [employer] offers a sliding scale Patient Assistance Program for patients without insurance, or whose  insurance does not completely cover the cost of the ZIO patch monitor. You must apply for the  Patient Assistance Program to qualify for the discounted rate. To apply, call [employer] at [phone],  select option  4, select option 2 , ask to apply for the Patient Assistance Program, (you can request an  interpreter if needed). [employer] will ask your household income and how many people are in your  household. [employer] will quote your out-of-pocket cost based on this information. They will also be able  to set up a 12 month interest free payment plan if needed.  Applying the monitor   Shave hair from upper left chest.  Hold the abrader disc by orange tab. Rub the abrader in 40 strokes over left upper chest as indicated in  your monitor instructions.  Clean area with 4 enclosed alcohol pads. Use all pads to ensure the area is cleaned thoroughly. Let  dry.  Apply patch as indicated in monitor instructions. Patch will be placed under collarbone on left side of  chest with arrow pointing upward.  Rub patch adhesive wings for 2 minutes. Remove the white label marked 1. Remove the white label  marked 2. Rub patch adhesive wings for 2 additional minutes.  While looking in a mirror, press and release button in center of patch. A small green light will flash 3-4  times. This will be your only indicator that the monitor has been turned on.  Do not shower for the first 24 hours. You may shower after the first 24 hours.  Press the button if you feel a symptom. You will hear a small click. Record Date, Time and Symptom in  the Patient Log.   Starting the Gateway  In your kit there is a  small plastic box the size of a cellphone. This is Buyer, retail. It transmits all your  recorded data to [facility]. This box must always stay within 10 feet of you. Open the box and push the *  button. There will be a light that blinks orange and then green a few times. When the light stops  blinking, the Gateway is connected to the ZIO patch. Call [employer] at [phone] to confirm your monitor is transmitting.  Returning your monitor  Remove your patch and place it inside the Gateway. In the lower half of the Gateway there  is a white  bag with prepaid postage on it. Place Gateway in bag and seal. Mail package back to [place] as soon as  possible. Your physician should have your final report approximately 7 days after you have mailed back  your monitor. Call [facility] Customer Care at [phone] if you have questions regarding your ZIO AT  patch monitor. Call them immediately if you see an orange light blinking on your monitor.  If your monitor falls off in less than 4 days, contact our Monitor department at [phone]. If your  monitor becomes loose or falls off after 4 days call [employer] at [phone] for suggestions on  securing your monitor   .Your physician has requested that you have an echocardiogram. Echocardiography is a painless test that uses sound waves to create images of your heart. It provides your doctor with information about the size and shape of your heart and how well your heart's chambers and valves are working.   You may receive an ultrasound enhancing agent through an IV if needed to better visualize your heart during the echo. This procedure takes approximately one hour.  There are no restrictions for this procedure.  This will take place at [address] [facility] [address], [place] [address]  Please note: We ask at that you not bring children with you during ultrasound (echo/ vascular) testing. Due to room size and safety concerns, children are not allowed in the ultrasound rooms during exams. Our front office staff cannot provide observation of children in our lobby area while testing is being conducted. An adult accompanying a patient to their appointment will only be allowed in the ultrasound room at the discretion of the ultrasound technician under special circumstances. We apologize for any inconvenience.   Your physician has requested that you have a carotid duplex. This test is an ultrasound of the carotid arteries in your neck. It looks at blood  flow through these arteries that supply the brain with blood.   Allow one hour for this exam.  There are no restrictions or special instructions.  This will take place at [address] [facility] [address]) [address], [place] [address]  Please note: We ask at that you not bring children with you during ultrasound (echo/ vascular) testing. Due to room size and safety concerns, children are not allowed in the ultrasound rooms during exams. Our front office staff cannot provide observation of children in our lobby area while testing is being conducted. An adult accompanying a patient to their appointment will only be allowed in the ultrasound room at the discretion of the ultrasound technician under special circumstances. We apologize for any inconvenience.  Brain MRI w/wo conterast  Follow-Up: At [facility], you and your health needs are our priority.  As part of our continuing mission to provide you with exceptional heart care, our providers are all part of one team.  This  team includes your primary Cardiologist (physician) and Advanced Practice Providers or APPs (Physician Assistants and Nurse Practitioners) who all work together to provide you with the care you need, when you need it.  Your next appointment:   2 month(s)  Provider:   You may see Lonni Hanson, MD or one of the following Advanced Practice Providers on your designated Care Team:   Lonni Meager, NP Lesley Maffucci, PA-C Bernardino Bring, PA-C Cadence [place], PA-C Tylene Lunch, NP Barnie Hila, NP

## 2023-11-03 ENCOUNTER — Encounter: Payer: Self-pay | Admitting: Family Medicine

## 2023-11-03 ENCOUNTER — Encounter: Payer: Self-pay | Admitting: Oncology

## 2023-11-03 ENCOUNTER — Ambulatory Visit (INDEPENDENT_AMBULATORY_CARE_PROVIDER_SITE_OTHER): Admitting: Family Medicine

## 2023-11-03 ENCOUNTER — Inpatient Hospital Stay: Payer: Self-pay | Attending: Internal Medicine

## 2023-11-03 VITALS — BP 121/80 | HR 68 | Temp 98.0°F | Resp 18 | Ht 62.0 in | Wt 178.0 lb

## 2023-11-03 DIAGNOSIS — Z23 Encounter for immunization: Secondary | ICD-10-CM

## 2023-11-03 DIAGNOSIS — F419 Anxiety disorder, unspecified: Secondary | ICD-10-CM | POA: Diagnosis not present

## 2023-11-03 DIAGNOSIS — D508 Other iron deficiency anemias: Secondary | ICD-10-CM

## 2023-11-03 DIAGNOSIS — I2111 ST elevation (STEMI) myocardial infarction involving right coronary artery: Secondary | ICD-10-CM

## 2023-11-03 DIAGNOSIS — G620 Drug-induced polyneuropathy: Secondary | ICD-10-CM | POA: Insufficient documentation

## 2023-11-03 DIAGNOSIS — D509 Iron deficiency anemia, unspecified: Secondary | ICD-10-CM | POA: Diagnosis not present

## 2023-11-03 DIAGNOSIS — E538 Deficiency of other specified B group vitamins: Secondary | ICD-10-CM | POA: Diagnosis present

## 2023-11-03 DIAGNOSIS — D701 Agranulocytosis secondary to cancer chemotherapy: Secondary | ICD-10-CM

## 2023-11-03 DIAGNOSIS — T451X5S Adverse effect of antineoplastic and immunosuppressive drugs, sequela: Secondary | ICD-10-CM

## 2023-11-03 DIAGNOSIS — R635 Abnormal weight gain: Secondary | ICD-10-CM

## 2023-11-03 MED ORDER — CYANOCOBALAMIN 1000 MCG/ML IJ SOLN
1000.0000 ug | Freq: Once | INTRAMUSCULAR | Status: AC
  Administered 2023-11-03: 1000 ug via INTRAMUSCULAR
  Filled 2023-11-03: qty 1

## 2023-11-03 MED ORDER — METFORMIN HCL ER 500 MG PO TB24: 500.0000 mg | ORAL_TABLET | Freq: Every day | ORAL | 2 refills | Status: DC

## 2023-11-03 MED ORDER — GABAPENTIN 300 MG PO CAPS: 300.0000 mg | ORAL_CAPSULE | Freq: Two times a day (BID) | ORAL | 5 refills | Status: DC

## 2023-11-03 MED ORDER — ALPRAZOLAM 1 MG PO TABS: 1.0000 mg | ORAL_TABLET | Freq: Two times a day (BID) | ORAL | 2 refills | Status: DC | PRN

## 2023-11-03 NOTE — Assessment & Plan Note (Signed)
 She has a history of iron deficiency we will check CBC and iron indices.

## 2023-11-03 NOTE — Assessment & Plan Note (Signed)
 Has a BMI of 32.65.  Has lost 10 pounds on metformin  500 mg XR daily.  Please continue metformin 

## 2023-11-03 NOTE — Progress Notes (Signed)
 Established Patient Office Visit  Subjective   Patient ID: Connie Cameron, female    DOB: Apr 24, 1960  Age: 63 y.o. MRN: [id number]  Chief Complaint  Patient presents with   Medical Management of Chronic Issues   Anxiety    HPI Discussed the use of AI scribe software for clinical note transcription with the patient, who gave verbal consent to proceed.  History of Present Illness   Connie Cameron is a 63 year old female who presents for medication management and follow-up.  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 Just had a cardiology follow-up 06/03/2022 she had an echo LVEF 60 to 65% and RWMA.  05/29/2023 carotid arteries bilateral normal.  Myocardial PET CT 03/06/2023 LVEF perfusion normal with no ischemia and no infarction. LVEF 61% at rest and 79% stressed.  Normal study, low risk for ischemia.    She is currently taking Zoloft  50 mg and reports that she is 'not bitchy' and feels good. She previously tried Wellbutrin  but discontinued it due to headaches. She is also on Xanax  1 mg twice daily for anxiety but has run out of her prescription and requests a refill.  She has been taking gabapentin  300 mg twice daily for neuropathic pain in her hands from her chemotherapy.  but has run out of this medication. She mentions difficulty obtaining refills despite her attempts to contact the pharmacy.  She reports a weight loss of 10 pounds, from 188 lbs to 178 lbs, and is pleased with this weight loss, noting that her clothes fit better. She is taking metformin  500mg  XR.    No food intake today. So she can get labs today  She has a history of tongue cancer, with no recurrence for the past three years. No current issues related to this.  She has a past history of anemia but does not report any current symptoms related to red blood cell issues.  No current skin issues and has not  seen a dermatologist recently. She has a history of smoking marijuana many years ago but does not currently use it.    ROS    Objective:     BP 121/80 (BP Location: Left Arm, Patient Position: Sitting, Cuff Size: Normal)   Pulse 68   Temp 98 F (36.7 C) (Oral)   Resp 18   Ht 5' 2 (1.575 m)   Wt 178 lb (80.7 kg)   SpO2 98%   BMI 32.56 kg/m    Physical Exam Vitals and nursing note reviewed.  Constitutional:      Appearance: Normal appearance.  HENT:     Head: Normocephalic and atraumatic.  Eyes:     Conjunctiva/sclera: Conjunctivae normal.  Cardiovascular:     Rate and Rhythm: Normal rate and regular rhythm.  Pulmonary:     Effort: Pulmonary effort is normal.     Breath sounds: Normal breath sounds.  Musculoskeletal:     Right lower leg: No edema.     Left lower leg: No edema.  Skin:    General: Skin is warm and dry.  Neurological:     Mental Status: She is alert and oriented to person, place, and time.  Psychiatric:        Mood and Affect: Mood normal.        Behavior: Behavior normal.        Thought Content: Thought content normal.  Judgment: Judgment normal.          No results found for any visits on 11/03/23.    The ASCVD Risk score (Arnett DK, et al., 2019) failed to calculate for the following reasons:   Risk score cannot be calculated because patient has a medical history suggesting prior/existing ASCVD    Assessment & Plan:  Immunization due -     Flu vaccine trivalent PF, 6mos and older(Flulaval,Afluria,Fluarix,Fluzone)  Anxiety Assessment & Plan: Doing well on xanax  1mg  BID and Zoloft  50mg  daily  Will refill xanax  for 90 days  Orders: -     ALPRAZolam ; Take 1 tablet (1 mg total) by mouth 2 (two) times daily as needed for anxiety.  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  Orders: -     Gabapentin ; Take 1  capsule (300 mg total) by mouth 2 (two) times daily.  Dispense: 60 capsule; Refill: 5  Iron deficiency anemia, unspecified iron deficiency anemia type -     CBC with Differential/Platelet -     Comprehensive metabolic panel with GFR -     Lipid panel -     Iron, TIBC and Ferritin Panel  Weight gain -     TSH -     T4, free  STEMI involving right coronary artery (HCC)  Iron deficiency anemia secondary to inadequate dietary iron intake Assessment & Plan: She has a history of iron deficiency we will check CBC and iron indices.   Morbid obesity (HCC) Assessment & Plan: Has a BMI of 32.65.  Has lost 10 pounds on metformin  500 mg XR daily.  Please continue metformin   Orders: -     metFORMIN  HCl ER; Take 1 tablet (500 mg total) by mouth daily with breakfast.  Dispense: 30 tablet; Refill: 2     Return in about 3 months (around 02/02/2024).    Connie Cameron K Connie Blitzer, MD

## 2023-11-03 NOTE — Assessment & Plan Note (Signed)
 Doing well on xanax  1mg  BID and Zoloft  50mg  daily  Will refill xanax  for 90 days

## 2023-11-03 NOTE — Assessment & Plan Note (Signed)
 Refilling gabapentin  300 mg twice daily.

## 2023-11-04 ENCOUNTER — Ambulatory Visit
Admission: RE | Admit: 2023-11-04 | Discharge: 2023-11-04 | Disposition: A | Discharge status: Home / Self Care | Source: Ambulatory Visit | Attending: Physician Assistant | Admitting: Physician Assistant

## 2023-11-04 DIAGNOSIS — R55 Syncope and collapse: Secondary | ICD-10-CM | POA: Diagnosis present

## 2023-11-04 MED ORDER — GADOBUTROL 1 MMOL/ML IV SOLN
8.0000 mL | Freq: Once | INTRAVENOUS | Status: AC | PRN
  Administered 2023-11-04: 8 mL via INTRAVENOUS

## 2023-11-05 ENCOUNTER — Ambulatory Visit: Payer: Self-pay | Admitting: Physician Assistant

## 2023-11-06 ENCOUNTER — Encounter: Payer: Self-pay | Admitting: Oncology

## 2023-11-07 ENCOUNTER — Ambulatory Visit: Payer: Self-pay | Admitting: Family Medicine

## 2023-11-07 ENCOUNTER — Telehealth: Payer: Self-pay | Admitting: Emergency Medicine

## 2023-11-07 LAB — COMPREHENSIVE METABOLIC PANEL WITH GFR
ALT: 15 IU/L (ref 0–32)
AST: 21 IU/L (ref 0–40)
Albumin: 4.5 g/dL (ref 3.9–4.9)
Alkaline Phosphatase: 123 IU/L (ref 49–135)
BUN/Creatinine Ratio: 14 (ref 12–28)
BUN: 14 mg/dL (ref 8–27)
Bilirubin Total: 0.6 mg/dL (ref 0.0–1.2)
CO2: 22 mmol/L (ref 20–29)
Calcium: 9.9 mg/dL (ref 8.7–10.3)
Chloride: 107 mmol/L — ABNORMAL HIGH (ref 96–106)
Creatinine, Ser: 1.01 mg/dL — ABNORMAL HIGH (ref 0.57–1.00)
Globulin, Total: 2.2 g/dL (ref 1.5–4.5)
Glucose: 94 mg/dL (ref 70–99)
Potassium: 4.2 mmol/L (ref 3.5–5.2)
Sodium: 145 mmol/L — ABNORMAL HIGH (ref 134–144)
Total Protein: 6.7 g/dL (ref 6.0–8.5)
eGFR: 63 mL/min/1.73 (ref >59)

## 2023-11-07 LAB — IRON,TIBC AND FERRITIN PANEL
Ferritin: 153 ng/mL — ABNORMAL HIGH (ref 15–150)
Iron Saturation: 23 % (ref 15–55)
Iron: 55 ug/dL (ref 27–139)
Total Iron Binding Capacity: 236 ug/dL — ABNORMAL LOW (ref 250–450)
UIBC: 181 ug/dL (ref 118–369)

## 2023-11-07 LAB — CBC WITH DIFFERENTIAL/PLATELET
Basophils Absolute: 0 x10E3/uL (ref 0.0–0.2)
Basos: 1 %
EOS (ABSOLUTE): 0.2 x10E3/uL (ref 0.0–0.4)
Eos: 2 %
Hematocrit: 37.3 % (ref 34.0–46.6)
Hemoglobin: 11.8 g/dL (ref 11.1–15.9)
Immature Grans (Abs): 0 x10E3/uL (ref 0.0–0.1)
Immature Granulocytes: 0 %
Lymphocytes Absolute: 1.1 x10E3/uL (ref 0.7–3.1)
Lymphs: 18 %
MCH: 29.3 pg (ref 26.6–33.0)
MCHC: 31.6 g/dL (ref 31.5–35.7)
MCV: 93 fL (ref 79–97)
Monocytes Absolute: 0.6 x10E3/uL (ref 0.1–0.9)
Monocytes: 9 %
Neutrophils Absolute: 4.4 x10E3/uL (ref 1.4–7.0)
Neutrophils: 70 %
Platelets: 221 x10E3/uL (ref 150–450)
RBC: 4.03 x10E6/uL (ref 3.77–5.28)
RDW: 13.8 % (ref 11.7–15.4)
WBC: 6.3 x10E3/uL (ref 3.4–10.8)

## 2023-11-07 LAB — LIPID PANEL
Chol/HDL Ratio: 3.3 ratio (ref 0.0–4.4)
Cholesterol, Total: 146 mg/dL (ref 100–199)
HDL: 44 mg/dL (ref >39)
LDL Chol Calc (NIH): 77 mg/dL (ref 0–99)
Triglycerides: 144 mg/dL (ref 0–149)
VLDL Cholesterol Cal: 25 mg/dL (ref 5–40)

## 2023-11-07 LAB — TSH: TSH: 5.51 u[IU]/mL — ABNORMAL HIGH (ref 0.450–4.500)

## 2023-11-07 NOTE — Telephone Encounter (Signed)
 Pt called and reports that after applying and activating AT ZIO that the monitor would quit blinking red - transmitter clipped on her belt loop.  Very nice person.

## 2023-11-14 ENCOUNTER — Ambulatory Visit (INDEPENDENT_AMBULATORY_CARE_PROVIDER_SITE_OTHER)

## 2023-11-14 ENCOUNTER — Other Ambulatory Visit (INDEPENDENT_AMBULATORY_CARE_PROVIDER_SITE_OTHER): Payer: Self-pay | Admitting: Nurse Practitioner

## 2023-11-14 ENCOUNTER — Encounter (INDEPENDENT_AMBULATORY_CARE_PROVIDER_SITE_OTHER): Payer: Self-pay | Admitting: Nurse Practitioner

## 2023-11-14 ENCOUNTER — Ambulatory Visit (INDEPENDENT_AMBULATORY_CARE_PROVIDER_SITE_OTHER): Admitting: Nurse Practitioner

## 2023-11-14 VITALS — BP 115/79 | HR 69 | Resp 16 | Ht 62.0 in | Wt 180.8 lb

## 2023-11-14 DIAGNOSIS — C4442 Squamous cell carcinoma of skin of scalp and neck: Secondary | ICD-10-CM | POA: Diagnosis not present

## 2023-11-14 DIAGNOSIS — I6523 Occlusion and stenosis of bilateral carotid arteries: Secondary | ICD-10-CM

## 2023-11-14 DIAGNOSIS — E782 Mixed hyperlipidemia: Secondary | ICD-10-CM | POA: Diagnosis not present

## 2023-11-14 DIAGNOSIS — I6522 Occlusion and stenosis of left carotid artery: Secondary | ICD-10-CM | POA: Diagnosis not present

## 2023-11-14 NOTE — H&P (View-Only) (Signed)
 Subjective:    Patient ID: Connie Cameron, female    DOB: [birth year], 63 y.o.   MRN: [id number] Chief Complaint  Patient presents with   New Patient (Initial Visit)    consult. stenosis of left carotid artery. CT in Epic from 9.24.25 2. Approximately 50% stenosis of the left proximal internal carotid artery;lesser atheromatous disease on the right. legrand norris.      The patient is a 63 year old female who presents today as a referral due to concern for carotid stenosis noted on a CT scan.  The CT scan was done as 1 for the soft tissue of the neck but it noted a likely 50% stenosis of the left ICA with minimal of the right.  She notes that she has recently been having syncopal events.  These events are unfortunately unexplained.  He notes that following these events she has noted hypotension.  The patient underwent CT scan and noted a 50% stenosis of the left ICA.  However independent review done by both myself and Dr. Marea showed a likely 70% stenosis.  She underwent carotid duplexes which show a 1 to 39% stenosis of the right with 40 to 59% of the left.  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  This radiation was done to her left neck.    Review of Systems  Neurological:  Positive for syncope and numbness.  All other systems reviewed and are negative.      Objective:   Physical Exam Vitals reviewed.  HENT:     Head: Normocephalic.  Cardiovascular:     Rate and Rhythm: Normal rate.     Pulses: Normal pulses.  Pulmonary:     Effort: Pulmonary effort is normal.  Skin:    General: Skin is warm and dry.  Neurological:     Mental Status: She is alert and oriented to person, place, and time.  Psychiatric:        Mood and Affect: Mood normal.        Behavior: Behavior normal.        Thought Content: Thought content normal.        Judgment: Judgment normal.     BP 115/79   Pulse 69   Resp 16   Ht 5' 2 (1.575 m)    Wt 180 lb 12.8 oz (82 kg)   BMI 33.07 kg/m   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(HCC)     Social History   Socioeconomic History   Marital status: Single    Spouse name: Not on file   Number of children: Not on file   Years of education: Not on file   Highest education level: Not on file  Occupational History   Not on file  Tobacco Use   Smoking status: Never    Passive exposure: Never   Smokeless tobacco: Never  Vaping Use   Vaping status: Never Used  Substance and Sexual Activity   Alcohol use: Not Currently   Drug use: No   Sexual activity: Not on file  Other Topics Concern   Not on file  Social History Narrative   Not on file   Social Drivers of Health   Financial Resource Strain: Not on file  Food Insecurity: No Food Insecurity (03/04/2023)   Hunger Vital Sign  Worried About Programme researcher, broadcasting/film/video in the Last Year: Never true    Ran Out of Food in the Last Year: Never true  Transportation Needs: No Transportation Needs (03/04/2023)   PRAPARE - Administrator, Civil Service (Medical): No    Lack of Transportation (Non-Medical): No  Physical Activity: Not on file  Stress: Not on file  Social Connections: Not on file  Intimate Partner Violence: Not At Risk (03/04/2023)   Humiliation, Afraid, Rape, and Kick questionnaire    Fear of Current or Ex-Partner: No    Emotionally Abused: No    Physically Abused: No    Sexually Abused: No    Past Surgical History:  Procedure Laterality Date   COLONOSCOPY WITH PROPOFOL  N/A 03/30/2020   Procedure: COLONOSCOPY WITH PROPOFOL ;  Surgeon: Janalyn Keene NOVAK, MD;  Location: [facility] ENDOSCOPY;  Service: Endoscopy;  Laterality: N/A;  COVID POSITIVE 02/29/2020   CORONARY/GRAFT ACUTE MI REVASCULARIZATION N/A 01/23/2022   Procedure: Coronary/Graft Acute MI Revascularization;  Surgeon: Mady Bruckner, MD;   Location: [facility] INVASIVE CV LAB;  Service: Cardiovascular;  Laterality: N/A;   ESOPHAGOGASTRODUODENOSCOPY N/A 07/24/2023   Procedure: EGD (ESOPHAGOGASTRODUODENOSCOPY);  Surgeon: Unk Corinn Skiff, MD;  Location: [facility] ENDOSCOPY;  Service: Gastroenterology;  Laterality: N/A;   ESOPHAGOGASTRODUODENOSCOPY (EGD) WITH PROPOFOL  N/A 03/30/2020   Procedure: ESOPHAGOGASTRODUODENOSCOPY (EGD) WITH PROPOFOL ;  Surgeon: Janalyn Keene NOVAK, MD;  Location: [facility] ENDOSCOPY;  Service: Endoscopy;  Laterality: N/A;   ESOPHAGOGASTRODUODENOSCOPY (EGD) WITH PROPOFOL  N/A 08/03/2020   Procedure: ESOPHAGOGASTRODUODENOSCOPY (EGD) WITH PROPOFOL ;  Surgeon: Janalyn Keene NOVAK, MD;  Location: [facility] ENDOSCOPY;  Service: Endoscopy;  Laterality: N/A;   ESOPHAGOGASTRODUODENOSCOPY (EGD) WITH PROPOFOL  N/A 01/25/2021   Procedure: ESOPHAGOGASTRODUODENOSCOPY (EGD) WITH PROPOFOL ;  Surgeon: Janalyn Keene NOVAK, MD;  Location: [facility] ENDOSCOPY;  Service: Endoscopy;  Laterality: N/A;   ESOPHAGOGASTRODUODENOSCOPY (EGD) WITH PROPOFOL  N/A 09/05/2021   Procedure: ESOPHAGOGASTRODUODENOSCOPY (EGD) WITH PROPOFOL ;  Surgeon: Unk Corinn Skiff, MD;  Location: [facility] ENDOSCOPY;  Service: Gastroenterology;  Laterality: N/A;   EXCISION MASS NECK Left 01/18/2019   Procedure: EXCISION MASS NECK/NODE;  Surgeon: Edda Mt, MD;  Location: [facility] ORS;  Service: ENT;  Laterality: Left;   IR REMOVAL TUN ACCESS W/ PORT W/O FL MOD SED  12/05/2021   LARYNGOSCOPY Bilateral 02/22/2019   Procedure: MICROSCOPIC DIRECT LARYNGOSCOPY AND BIOPSY;  Surgeon: Edda Mt, MD;  Location: [facility] ORS;  Service: ENT;  Laterality: Bilateral;   LEFT HEART CATH AND CORONARY ANGIOGRAPHY N/A 01/23/2022   Procedure: LEFT HEART CATH AND CORONARY ANGIOGRAPHY;  Surgeon: Mady Bruckner, MD;  Location: [facility] INVASIVE CV LAB;  Service: Cardiovascular;  Laterality: N/A;   PORTA CATH INSERTION N/A 03/24/2019   Procedure: PORTA CATH INSERTION;  Surgeon: Jama Cordella MATSU, MD;  Location: [facility]  INVASIVE CV LAB;  Service: Cardiovascular;  Laterality: N/A;   TONSILLECTOMY     TUBAL LIGATION      Family History  Problem Relation Age of Onset   Breast cancer Sister    Brain cancer Paternal Grandfather     Allergies  Allergen Reactions   Chlorhexidine      Pt doesn't have anything that requires CHG bath       Latest Ref Rng & Units 11/06/2023   11:51 AM 06/18/2023    4:33 PM 05/30/2023   12:58 PM  CBC  WBC 3.4 - 10.8 x10E3/uL 6.3  5.8  8.6   Hemoglobin 11.1 - 15.9 g/dL 88.1  89.4  87.8   Hematocrit 34.0 - 46.6 % 37.3  32.1  37.1  Platelets 150 - 450 x10E3/uL 221  205  246       CMP     Component Value Date/Time   NA 145 (H) 11/06/2023 1151   NA 140 06/24/2011 1709   K 4.2 11/06/2023 1151   K 4.3 06/24/2011 1709   CL 107 (H) 11/06/2023 1151   CL 105 06/24/2011 1709   CO2 22 11/06/2023 1151   CO2 30 06/24/2011 1709   GLUCOSE 94 11/06/2023 1151   GLUCOSE 101 (H) 06/18/2023 1607   GLUCOSE 101 (H) 06/24/2011 1709   BUN 14 11/06/2023 1151   BUN 17 06/24/2011 1709   CREATININE 1.01 (H) 11/06/2023 1151   CREATININE 1.11 (H) 05/30/2023 1258   CREATININE 0.91 06/24/2011 1709   CALCIUM  9.9 11/06/2023 1151   CALCIUM  9.4 06/24/2011 1709   PROT 6.7 11/06/2023 1151   PROT 8.4 (H) 06/24/2011 1709   ALBUMIN 4.5 11/06/2023 1151   ALBUMIN 4.1 06/24/2011 1709   AST 21 11/06/2023 1151   AST 20 05/30/2023 1258   ALT 15 11/06/2023 1151   ALT 18 05/30/2023 1258   ALT 18 06/24/2011 1709   ALKPHOS 123 11/06/2023 1151   ALKPHOS 99 06/24/2011 1709   BILITOT 0.6 11/06/2023 1151   BILITOT 0.9 05/30/2023 1258   EGFR 63 11/06/2023 1151   GFRNONAA 51 (L) 06/18/2023 1607   GFRNONAA 56 (L) 05/30/2023 1258   GFRNONAA >60 06/24/2011 1709     No results found.     Assessment & Plan:   1. Stenosis of left carotid artery (Primary) Between the patient's CT scan and ultrasound, there are discordant results.  There is minimal stenosis on the right which is consistent with both  studies however, during independent review of her CT scan by both myself and Dr. Marea and notes that the left ICA stenosis is likely at least 70%.  The ultrasound shows 40 to 59% stenosis.  Based upon these discordant results as well as the patient's unexplained syncopal events, I believe would be prudent for the patient undergo a carotid angiogram.  Also increasing the likelihood of significant stenosis of her left as the fact that she has undergone radiation of her left neck to treat her cancer.  It is widely known that neck radiation can increase stenosis of the carotid arteries.  I have discussed that this will be a diagnostic angiogram with the intent to treat if it is noted that she indeed has a greater than 70% stenosis.  If the patient has a lower stenosis which would not explain her symptoms, no intervention would be done.  However in the event of intervention she can expect a carotid stent placement with an overnight stay.  She has been specifically advised to continue both aspirin  and Brilinta  up to her surgery as she will need to continue this, has been on antiplatelet therapy prior to stent placement can decrease the risk of stroke.  I have discussed the risk, benefits and alternatives and the patient is agreeable to proceed.  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  3. Mixed hyperlipidemia Continue statin as ordered and reviewed, no changes at this time   Current Outpatient Medications on File Prior to Visit  Medication Sig Dispense Refill   ALPRAZolam  (XANAX ) 1 MG tablet Take 1 tablet (1 mg total) by mouth 2 (two) times daily as needed for anxiety. 60 tablet 2  aspirin  81 MG chewable tablet Chew 1 tablet (81 mg total) by mouth daily. 30 tablet 1   atorvastatin  (LIPITOR ) 80 MG tablet TAKE 1 TABLET BY MOUTH EVERY DAY 90 tablet 1   BRILINTA  90 MG TABS tablet TAKE 1 TABLET BY  MOUTH 2 TIMES DAILY. 180 tablet 3   Docusate Sodium (DSS) 100 MG CAPS Take 1 capsule by mouth every other day.     gabapentin  (NEURONTIN ) 300 MG capsule Take 1 capsule (300 mg total) by mouth 2 (two) times daily. 60 capsule 5   isosorbide  mononitrate (IMDUR ) 30 MG 24 hr tablet TAKE 1/2 OF A TABLET (15 MG TOTAL) BY MOUTH DAILY 45 tablet 3   meclizine  (ANTIVERT ) 12.5 MG tablet Take 1 tablet (12.5 mg total) by mouth 3 (three) times daily as needed for dizziness or nausea. 30 tablet 0   metFORMIN  (GLUCOPHAGE -XR) 500 MG 24 hr tablet Take 1 tablet (500 mg total) by mouth daily with breakfast. 30 tablet 3   methocarbamol (ROBAXIN) 500 MG tablet Take 500 mg by mouth at bedtime.     metoprolol  succinate (TOPROL -XL) 25 MG 24 hr tablet TAKE 0.5 TABLETS BY MOUTH DAILY. TAKE WITH OR IMMEDIATELY FOLLOWING A MEAL. 45 tablet 3   omeprazole  (PRILOSEC) 40 MG capsule Take 1 capsule (40 mg total) by mouth 2 (two) times daily before a meal. 60 capsule 1   sertraline  (ZOLOFT ) 50 MG tablet Take 1 tablet (50 mg total) by mouth daily. 30 tablet 3   Vitamin D, Ergocalciferol, (DRISDOL) 1.25 MG (50000 UNIT) CAPS capsule Take 50,000 Units by mouth once a week.     metFORMIN  (GLUCOPHAGE -XR) 500 MG 24 hr tablet Take 1 tablet (500 mg total) by mouth daily with breakfast. (Patient not taking: Reported on 11/14/2023) 30 tablet 2   No current facility-administered medications on file prior to visit.    There are no Patient Instructions on file for this visit. No follow-ups on file.   Devi Hopman E Obelia Bonello, NP

## 2023-11-14 NOTE — Progress Notes (Signed)
 Subjective:    Patient ID: Connie Cameron, female    DOB: [birth year], 63 y.o.   MRN: [id number] Chief Complaint  Patient presents with   New Patient (Initial Visit)    consult. stenosis of left carotid artery. CT in Epic from 9.24.25 2. Approximately 50% stenosis of the left proximal internal carotid artery;lesser atheromatous disease on the right. legrand norris.      The patient is a 63 year old female who presents today as a referral due to concern for carotid stenosis noted on a CT scan.  The CT scan was done as 1 for the soft tissue of the neck but it noted a likely 50% stenosis of the left ICA with minimal of the right.  She notes that she has recently been having syncopal events.  These events are unfortunately unexplained.  He notes that following these events she has noted hypotension.  The patient underwent CT scan and noted a 50% stenosis of the left ICA.  However independent review done by both myself and Dr. Marea showed a likely 70% stenosis.  She underwent carotid duplexes which show a 1 to 39% stenosis of the right with 40 to 59% of the left.  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  This radiation was done to her left neck.    Review of Systems  Neurological:  Positive for syncope and numbness.  All other systems reviewed and are negative.      Objective:   Physical Exam Vitals reviewed.  HENT:     Head: Normocephalic.  Cardiovascular:     Rate and Rhythm: Normal rate.     Pulses: Normal pulses.  Pulmonary:     Effort: Pulmonary effort is normal.  Skin:    General: Skin is warm and dry.  Neurological:     Mental Status: She is alert and oriented to person, place, and time.  Psychiatric:        Mood and Affect: Mood normal.        Behavior: Behavior normal.        Thought Content: Thought content normal.        Judgment: Judgment normal.     BP 115/79   Pulse 69   Resp 16   Ht 5' 2 (1.575 m)    Wt 180 lb 12.8 oz (82 kg)   BMI 33.07 kg/m   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(HCC)     Social History   Socioeconomic History   Marital status: Single    Spouse name: Not on file   Number of children: Not on file   Years of education: Not on file   Highest education level: Not on file  Occupational History   Not on file  Tobacco Use   Smoking status: Never    Passive exposure: Never   Smokeless tobacco: Never  Vaping Use   Vaping status: Never Used  Substance and Sexual Activity   Alcohol use: Not Currently   Drug use: No   Sexual activity: Not on file  Other Topics Concern   Not on file  Social History Narrative   Not on file   Social Drivers of Health   Financial Resource Strain: Not on file  Food Insecurity: No Food Insecurity (03/04/2023)   Hunger Vital Sign  Worried About Programme researcher, broadcasting/film/video in the Last Year: Never true    Ran Out of Food in the Last Year: Never true  Transportation Needs: No Transportation Needs (03/04/2023)   PRAPARE - Administrator, Civil Service (Medical): No    Lack of Transportation (Non-Medical): No  Physical Activity: Not on file  Stress: Not on file  Social Connections: Not on file  Intimate Partner Violence: Not At Risk (03/04/2023)   Humiliation, Afraid, Rape, and Kick questionnaire    Fear of Current or Ex-Partner: No    Emotionally Abused: No    Physically Abused: No    Sexually Abused: No    Past Surgical History:  Procedure Laterality Date   COLONOSCOPY WITH PROPOFOL  N/A 03/30/2020   Procedure: COLONOSCOPY WITH PROPOFOL ;  Surgeon: Janalyn Keene NOVAK, MD;  Location: [facility] ENDOS[facility];  Service: Endoscopy;  Laterality: N/A;  COVID POSITIVE 02/29/2020   CORONARY/GRAFT ACUTE MI REVASCULARIZATION N/A 01/23/2022   Procedure: Coronary/Graft Acute MI Revascularization;  Surgeon: Mady Bruckner, MD;   Location: [facility] IN[facility];  Service: Cardiovascular;  Laterality: N/A;   ESOPHAGOGASTRODUODENOSCOPY N/A 07/24/2023   Procedure: EGD (ESOPHAGOGASTRODUODENOSCOPY);  Surgeon: Unk Corinn Skiff, MD;  Location: [facility] ENDOSCOPY;  Service: Gastroenterology;  Laterality: N/A;   ESOPHAGOGASTRODUODENOSCOPY (EGD) WITH PROPOFOL  N/A 03/30/2020   Procedure: ESOPHAGOGASTRODUODENOSCOPY (EGD) WITH PROPOFOL ;  Surgeon: Janalyn Keene NOVAK, MD;  Location: [facility] END[facility];  Service: Endoscopy;  Laterality: N/A;   ESOPHAGOGASTRODUODENOSCOPY (EGD) WITH PROPOFOL  N/A 08/03/2020   Procedure: ESOPHAGOGASTRODUODENOSCOPY (EGD) WITH PROPOFOL ;  Surgeon: Janalyn Keene NOVAK, MD;  Location: [facility] ENDOSCOPY;  Service: Endoscopy;  Laterality: N/A;   ESOPHAGOGASTRODUODENOSCOPY (EGD) WITH PROPOFOL  N/A 01/25/2021   Procedure: ESOPHAGOGASTRODUODENOSCOPY (EGD) WITH PROPOFOL ;  Surgeon: Janalyn Keene NOVAK, MD;  Location: [facility] END[facility];  Service: Endoscopy;  Laterality: N/A;   ESOPHAGOGASTRODUODENOSCOPY (EGD) WITH PROPOFOL  N/A 09/05/2021   Procedure: ESOPHAGOGASTRODUODENOSCOPY (EGD) WITH PROPOFOL ;  Surgeon: Unk Corinn Skiff, MD;  Location: [facility] ENDOSCOPY;  Service: Gastroenterology;  Laterality: N/A;   EXCISION MASS NECK Left 01/18/2019   Procedure: EXCISION MASS NECK/NODE;  Surgeon: Edda Mt, MD;  Location: [facility] ORS;  Service: ENT;  Laterality: Left;   IR REMOVAL TUN ACCESS W/ PORT W/O FL MOD SED  12/05/2021   LARYNGOSCOPY Bilateral 02/22/2019   Procedure: MICROSCOPIC DIRECT LARYNGOSCOPY AND BIOPSY;  Surgeon: Edda Mt, MD;  Location: [facility] OR[facility];  Service: ENT;  Laterality: Bilateral;   LEFT HEART CATH AND CORONARY ANGIOGRAPHY N/A 01/23/2022   Procedure: LEFT HEART CATH AND CORONARY ANGIOGRAPHY;  Surgeon: Mady Bruckner, MD;  Location: [facility];  Service: Cardiovascular;  Laterality: N/A;   PORTA CATH INSERTION N/A 03/24/2019   Procedure: PORTA CATH INSERTION;  Surgeon: Jama Cordella MATSU, MD;  Location: [facility]  INVASIVE CV LAB;  Service: Cardiovascular;  Laterality: N/A;   TONSILLECTOMY     TUBAL LIGATION      Family History  Problem Relation Age of Onset   Breast cancer Sister    Brain cancer Paternal Grandfather     Allergies  Allergen Reactions   Chlorhexidine      Pt doesn't have anything that requires CHG bath       Latest Ref Rng & Units 11/06/2023   11:51 AM 06/18/2023    4:33 PM 05/30/2023   12:58 PM  CBC  WBC 3.4 - 10.8 x10E3/uL 6.3  5.8  8.6   Hemoglobin 11.1 - 15.9 g/dL 88.1  89.4  87.8   Hematocrit 34.0 - 46.6 % 37.3  32.1  37.1  Platelets 150 - 450 x10E3/uL 221  205  246       CMP     Component Value Date/Time   NA 145 (H) 11/06/2023 1151   NA 140 06/24/2011 1709   K 4.2 11/06/2023 1151   K 4.3 06/24/2011 1709   CL 107 (H) 11/06/2023 1151   CL 105 06/24/2011 1709   CO2 22 11/06/2023 1151   CO2 30 06/24/2011 1709   GLUCOSE 94 11/06/2023 1151   GLUCOSE 101 (H) 06/18/2023 1607   GLUCOSE 101 (H) 06/24/2011 1709   BUN 14 11/06/2023 1151   BUN 17 06/24/2011 1709   CREATININE 1.01 (H) 11/06/2023 1151   CREATININE 1.11 (H) 05/30/2023 1258   CREATININE 0.91 06/24/2011 1709   CALCIUM  9.9 11/06/2023 1151   CALCIUM  9.4 06/24/2011 1709   PROT 6.7 11/06/2023 1151   PROT 8.4 (H) 06/24/2011 1709   ALBUMIN 4.5 11/06/2023 1151   ALBUMIN 4.1 06/24/2011 1709   AST 21 11/06/2023 1151   AST 20 05/30/2023 1258   ALT 15 11/06/2023 1151   ALT 18 05/30/2023 1258   ALT 18 06/24/2011 1709   ALKPHOS 123 11/06/2023 1151   ALKPHOS 99 06/24/2011 1709   BILITOT 0.6 11/06/2023 1151   BILITOT 0.9 05/30/2023 1258   EGFR 63 11/06/2023 1151   GFRNONAA 51 (L) 06/18/2023 1607   GFRNONAA 56 (L) 05/30/2023 1258   GFRNONAA >60 06/24/2011 1709     No results found.     Assessment & Plan:   1. Stenosis of left carotid artery (Primary) Between the patient's CT scan and ultrasound, there are discordant results.  There is minimal stenosis on the right which is consistent with both  studies however, during independent review of her CT scan by both myself and Dr. Marea and notes that the left ICA stenosis is likely at least 70%.  The ultrasound shows 40 to 59% stenosis.  Based upon these discordant results as well as the patient's unexplained syncopal events, I believe would be prudent for the patient undergo a carotid angiogram.  Also increasing the likelihood of significant stenosis of her left as the fact that she has undergone radiation of her left neck to treat her cancer.  It is widely known that neck radiation can increase stenosis of the carotid arteries.  I have discussed that this will be a diagnostic angiogram with the intent to treat if it is noted that she indeed has a greater than 70% stenosis.  If the patient has a lower stenosis which would not explain her symptoms, no intervention would be done.  However in the event of intervention she can expect a carotid stent placement with an overnight stay.  She has been specifically advised to continue both aspirin  and Brilinta  up to her surgery as she will need to continue this, has been on antiplatelet therapy prior to stent placement can decrease the risk of stroke.  I have discussed the risk, benefits and alternatives and the patient is agreeable to proceed.  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  3. Mixed hyperlipidemia Continue statin as ordered and reviewed, no changes at this time   Current Outpatient Medications on File Prior to Visit  Medication Sig Dispense Refill   ALPRAZolam  (XANAX ) 1 MG tablet Take 1 tablet (1 mg total) by mouth 2 (two) times daily as needed for anxiety. 60 tablet 2  aspirin  81 MG chewable tablet Chew 1 tablet (81 mg total) by mouth daily. 30 tablet 1   atorvastatin  (LIPITOR ) 80 MG tablet TAKE 1 TABLET BY MOUTH EVERY DAY 90 tablet 1   BRILINTA  90 MG TABS tablet TAKE 1 TABLET BY  MOUTH 2 TIMES DAILY. 180 tablet 3   Docusate Sodium (DSS) 100 MG CAPS Take 1 capsule by mouth every other day.     gabapentin  (NEURONTIN ) 300 MG capsule Take 1 capsule (300 mg total) by mouth 2 (two) times daily. 60 capsule 5   isosorbide  mononitrate (IMDUR ) 30 MG 24 hr tablet TAKE 1/2 OF A TABLET (15 MG TOTAL) BY MOUTH DAILY 45 tablet 3   meclizine  (ANTIVERT ) 12.5 MG tablet Take 1 tablet (12.5 mg total) by mouth 3 (three) times daily as needed for dizziness or nausea. 30 tablet 0   metFORMIN  (GLUCOPHAGE -XR) 500 MG 24 hr tablet Take 1 tablet (500 mg total) by mouth daily with breakfast. 30 tablet 3   methocarbamol (ROBAXIN) 500 MG tablet Take 500 mg by mouth at bedtime.     metoprolol  succinate (TOPROL -XL) 25 MG 24 hr tablet TAKE 0.5 TABLETS BY MOUTH DAILY. TAKE WITH OR IMMEDIATELY FOLLOWING A MEAL. 45 tablet 3   omeprazole  (PRILOSEC) 40 MG capsule Take 1 capsule (40 mg total) by mouth 2 (two) times daily before a meal. 60 capsule 1   sertraline  (ZOLOFT ) 50 MG tablet Take 1 tablet (50 mg total) by mouth daily. 30 tablet 3   Vitamin D, Ergocalciferol, (DRISDOL) 1.25 MG (50000 UNIT) CAPS capsule Take 50,000 Units by mouth once a week.     metFORMIN  (GLUCOPHAGE -XR) 500 MG 24 hr tablet Take 1 tablet (500 mg total) by mouth daily with breakfast. (Patient not taking: Reported on 11/14/2023) 30 tablet 2   No current facility-administered medications on file prior to visit.    There are no Patient Instructions on file for this visit. No follow-ups on file.   Devi Hopman E Obelia Bonello, NP

## 2023-11-17 ENCOUNTER — Telehealth (INDEPENDENT_AMBULATORY_CARE_PROVIDER_SITE_OTHER): Payer: Self-pay

## 2023-11-17 NOTE — Telephone Encounter (Signed)
 Spoke with the patient and she is scheduled with Dr. Marea on 11/24/23 with a 8:30 am arrival time to the [facility] for a left carotid angio/stent placement. Pre-procedure instructions were discussed and will be sent to Mychart and mailed.

## 2023-11-19 ENCOUNTER — Other Ambulatory Visit: Payer: Self-pay

## 2023-11-19 ENCOUNTER — Encounter: Payer: Self-pay | Admitting: Emergency Medicine

## 2023-11-19 ENCOUNTER — Emergency Department: Admission: EM | Admit: 2023-11-19 | Discharge: 2023-11-20 | Disposition: A | Discharge status: Home / Self Care

## 2023-11-19 DIAGNOSIS — R55 Syncope and collapse: Secondary | ICD-10-CM | POA: Diagnosis present

## 2023-11-19 LAB — COMPREHENSIVE METABOLIC PANEL WITH GFR
ALT: 18 U/L (ref 0–44)
AST: 35 U/L (ref 15–41)
Albumin: 4.1 g/dL (ref 3.5–5.0)
Alkaline Phosphatase: 89 U/L (ref 38–126)
Anion gap: 15 (ref 5–15)
BUN: 13 mg/dL (ref 8–23)
CO2: 19 mmol/L — ABNORMAL LOW (ref 22–32)
Calcium: 9.3 mg/dL (ref 8.9–10.3)
Chloride: 105 mmol/L (ref 98–111)
Creatinine, Ser: 0.99 mg/dL (ref 0.44–1.00)
GFR, Estimated: >60 mL/min (ref >60)
Glucose, Bld: 143 mg/dL — ABNORMAL HIGH (ref 70–99)
Potassium: 3.3 mmol/L — ABNORMAL LOW (ref 3.5–5.1)
Sodium: 139 mmol/L (ref 135–145)
Total Bilirubin: 0.9 mg/dL (ref 0.0–1.2)
Total Protein: 7.3 g/dL (ref 6.5–8.1)

## 2023-11-19 LAB — TROPONIN I (HIGH SENSITIVITY)
Troponin I (High Sensitivity): 4 ng/L (ref <18)
Troponin I (High Sensitivity): 3 ng/L (ref <18)

## 2023-11-19 LAB — URINALYSIS, ROUTINE W REFLEX MICROSCOPIC
Bilirubin Urine: NEGATIVE
Glucose, UA: NEGATIVE mg/dL
Hgb urine dipstick: NEGATIVE
Ketones, ur: NEGATIVE mg/dL
Nitrite: NEGATIVE
Protein, ur: NEGATIVE mg/dL
Specific Gravity, Urine: 1.006 (ref 1.005–1.030)
pH: 5 (ref 5.0–8.0)

## 2023-11-19 LAB — CBC
HCT: 35.3 % — ABNORMAL LOW (ref 36.0–46.0)
Hemoglobin: 11.4 g/dL — ABNORMAL LOW (ref 12.0–15.0)
MCH: 29.2 pg (ref 26.0–34.0)
MCHC: 32.3 g/dL (ref 30.0–36.0)
MCV: 90.5 fL (ref 80.0–100.0)
Platelets: 306 K/uL (ref 150–400)
RBC: 3.9 MIL/uL (ref 3.87–5.11)
RDW: 14.1 % (ref 11.5–15.5)
WBC: 9 K/uL (ref 4.0–10.5)
nRBC: 0 % (ref 0.0–0.2)

## 2023-11-19 MED ORDER — SODIUM CHLORIDE 0.9 % IV BOLUS
500.0000 mL | Freq: Once | INTRAVENOUS | Status: AC
  Administered 2023-11-19: 500 mL via INTRAVENOUS

## 2023-11-19 MED ORDER — ONDANSETRON HCL 4 MG/2ML IJ SOLN
4.0000 mg | Freq: Once | INTRAMUSCULAR | Status: AC
  Administered 2023-11-19: 4 mg via INTRAVENOUS
  Filled 2023-11-19: qty 2

## 2023-11-19 NOTE — ED Notes (Signed)
 Pt taken back to rm 14 by this tech. Pt placed on cardiac monitor.

## 2023-11-19 NOTE — ED Triage Notes (Addendum)
 Pt arrives via ems c/o of near syncope while sitting down. Pt states she became diaphoretic and felt like she was going to pass out w/ nausea. Pt states she has a blockage in left carotid and is due for a stent placement on Monday. Pt received 4 mg of zofran  en route. Pt still nauseated and is lightheaded. Denies pain or sob. NADN.

## 2023-11-19 NOTE — ED Provider Notes (Signed)
 [facility] Provider Note    Event Date/Time   First MD Initiated Contact with Patient 11/19/23 2031     (approximate)   History   Near Syncope and Nausea  Pt arrives via ems c/o of near syncope while sitting down. Pt states she became diaphoretic and felt like she was going to pass out w/ nausea. Pt states she has a blockage in left carotid and is due for a stent placement on Monday. Pt received 4 mg of zofran  en route. Pt still nauseated and is lightheaded. Denies pain or sob. NADN.   HPI GETSEMANI LINDON is a 63 y.o. female PMH squamous cell carcinoma tongue, GERD, left carotid stenosis presents for evaluation of a near syncopal episode -Patient states around 4-5 PM she started developing a gradual onset headache with some mild nausea.  She gets these frequently.  Not thunderclap, not worst headache of life.  No focal weakness.  Not on thinners.  Says when she develops these types of headaches she notes she will often have syncopal or near syncopal episodes. -Sometime around 7 PM she started feeling very diaphoretic and lightheaded as if she was going to pass out.  Roommate called an ambulance.  Received Zofran  en route. -Denies any chest or abdominal pain -Says this is very similar to the prior episodes which she has had.  Believes that it happened 3-4 times since May of this year, has not sought medical care for all of these episodes. -Currently has some nausea, no other symptoms -she is planned for interventional evaluation of left carotid artery stenosis and possible stenting this coming Monday  Per chart review, last brain imaging was an MRI on 10/31/2023, no turning underlying pathology Last cardiac stress test (nuclear medicine) January 2025, unremarkable Last echo 05/2022, unremarkable     Physical Exam   Triage Vital Signs: ED Triage Vitals [11/19/23 1959]  Encounter Vitals Group     BP (!) 118/91     Girls Systolic BP Percentile      Girls  Diastolic BP Percentile      Boys Systolic BP Percentile      Boys Diastolic BP Percentile      Pulse Rate 79     Resp 16     Temp 97.9 F (36.6 C)     Temp Source Oral     SpO2 98 %     Weight      Height      Head Circumference      Peak Flow      Pain Score 0     Pain Loc      Pain Education      Exclude from Growth Chart     Most recent vital signs: Vitals:   11/19/23 1959 11/19/23 2315  BP: (!) 118/91 117/80  Pulse: 79 76  Resp: 16 16  Temp: 97.9 F (36.6 C) 97.9 F (36.6 C)  SpO2: 98% 99%     General: Awake, no distress.  HEENT: Normocephalic, atraumatic, no meningismus CV:  Good peripheral perfusion. RRR, RP 2+ Resp:  Normal effort. CTAB Abd:  No distention. Nontender to deep palpation throughout Neuro:  Aox4, CN II-XII intact, FNF wnl, finger taps fast b/l, 5/5 strength in bilateral finger extension/grip, arm flexion/extension, EHL/FHL. BUE AG 10+ sec no drift, BLE AG 5+ sec no drift. Ambulates with steady gait. SILT. Negative Rhomberg.    ED Results / Procedures / Treatments   Labs (all labs ordered are listed, but  only abnormal results are displayed) Labs Reviewed  COMPREHENSIVE METABOLIC PANEL WITH GFR - Abnormal; Notable for the following components:      Result Value   Potassium 3.3 (*)    CO2 19 (*)    Glucose, Bld 143 (*)    All other components within normal limits  CBC - Abnormal; Notable for the following components:   Hemoglobin 11.4 (*)    HCT 35.3 (*)    All other components within normal limits  URINALYSIS, ROUTINE W REFLEX MICROSCOPIC - Abnormal; Notable for the following components:   Color, Urine YELLOW (*)    APPearance HAZY (*)    Leukocytes,Ua TRACE (*)    Bacteria, UA RARE (*)    All other components within normal limits  TROPONIN I (HIGH SENSITIVITY)  TROPONIN I (HIGH SENSITIVITY)     EKG  See ED course below.   RADIOLOGY N/a    PROCEDURES:  Critical Care performed: No  Procedures   MEDICATIONS ORDERED  IN ED: Medications  sodium chloride  0.9 % bolus 500 mL (0 mLs Intravenous Stopped 11/19/23 2316)  ondansetron  (ZOFRAN ) injection 4 mg (4 mg Intravenous Given 11/19/23 2132)     IMPRESSION / MDM / ASSESSMENT AND PLAN / ED COURSE  I reviewed the triage vital signs and the nursing notes.                              DDX/MDM/AP: Differential diagnosis includes, but is not limited to, possible vasovagal episode, consider underlying anemia, electrolyte abnormality, dehydration.  Consider transient arrhythmia, considered but doubt anginal equivalent.  Do not clinically suspect intracranial pathology and no evidence of CVA on my eval.   Plan: - Labs - EKG - Zofran , small bolus IV fluid  Patient's presentation is most consistent with acute presentation with potential threat to life or bodily function.  The patient is on the cardiac monitor to evaluate for evidence of arrhythmia and/or significant heart rate changes.  ED course below.  Workup unremarkable, evidence of mild dehydration and mild hypokalemia, repleted.  Serial troponins normal.  No evidence of acute pathology today, no occasion for admission after this near syncopal episode.  Please follow-up with vascular surgery on Monday for further evaluation of her carotid stenosis.  ED return precautions in place.  Patient patient denied.  Clinical Course as of 11/19/23 2347  Wed Nov 19, 2023  2049 CMP with mild hypokalemia and mildly low bicarb, will give IV fluid and replete potassium p.o. [MM]  2049 CBC with no leukocytosis, mild anemia within baseline range [MM]  2049 Ecg = nsr, rate 79, no ste/std, no significat repol abnl, normal axis, normal intervals. No clear e/o ischemia nor arrhythmia on my read. Qtc 424.  [MM]  2124 Trop wnl [MM]  2339 Rpt trop stable [MM]  2341 Patient reevaluated, remains asymptomatic here in emergency department with no recurrence of symptoms  Appears last echocardiogram was about a year ago  In shared  decision making, patient is comfortable with discharge home and will follow-up with vascular surgery on Monday as already planned.  Strict ED return precautions in place.  Patient agrees to plan.  No evidence of acute pathology at this time.  Overall suspect likely vasovagal episode though fortunately no for syncope.  Carotid stenosis may be contributing but does have close follow-up arranged for this already.   [MM]    Clinical Course User Index [MM] Clarine Ozell LABOR, MD  FINAL CLINICAL IMPRESSION(S) / ED DIAGNOSES   Final diagnoses:  Near syncope     Rx / DC Orders   ED Discharge Orders     None        Note:  This document was prepared using Dragon voice recognition software and may include unintentional dictation errors.   Clarine Ozell LABOR, MD 11/19/23 [phone]

## 2023-11-19 NOTE — ED Notes (Signed)
 Ambulated to bathroom with no assistance. Steady gait with walking boot. Urine sample obtained and sent to lab.

## 2023-11-19 NOTE — Discharge Instructions (Addendum)
 Your evaluation in the emergency department was overall reassuring.  I do suspect mild dehydration likely contributed to your presentation, and I recommend continue to drink plenty of fluids daily.  Please do follow-up with your vascular surgeon as already planned as well as your primary care doctor, and return to the emergency department with any new or worsening symptoms.

## 2023-11-24 ENCOUNTER — Inpatient Hospital Stay
Admission: RE | Admit: 2023-11-24 | Discharge: 2023-12-01 | DRG: 035 | Disposition: A | Discharge status: Home Health | Attending: Vascular Surgery | Admitting: Vascular Surgery

## 2023-11-24 ENCOUNTER — Encounter: Payer: Self-pay | Admitting: Vascular Surgery

## 2023-11-24 ENCOUNTER — Inpatient Hospital Stay

## 2023-11-24 ENCOUNTER — Encounter
Admission: RE | Disposition: A | Discharge status: Home Health | Payer: Self-pay | Source: Home / Self Care | Attending: Vascular Surgery

## 2023-11-24 ENCOUNTER — Other Ambulatory Visit: Payer: Self-pay

## 2023-11-24 DIAGNOSIS — Z883 Allergy status to other anti-infective agents status: Secondary | ICD-10-CM | POA: Diagnosis not present

## 2023-11-24 DIAGNOSIS — I724 Aneurysm of artery of lower extremity: Secondary | ICD-10-CM | POA: Diagnosis not present

## 2023-11-24 DIAGNOSIS — Z79899 Other long term (current) drug therapy: Secondary | ICD-10-CM

## 2023-11-24 DIAGNOSIS — E782 Mixed hyperlipidemia: Secondary | ICD-10-CM | POA: Diagnosis present

## 2023-11-24 DIAGNOSIS — Z9889 Other specified postprocedural states: Secondary | ICD-10-CM | POA: Diagnosis not present

## 2023-11-24 DIAGNOSIS — F419 Anxiety disorder, unspecified: Secondary | ICD-10-CM | POA: Diagnosis present

## 2023-11-24 DIAGNOSIS — I6523 Occlusion and stenosis of bilateral carotid arteries: Principal | ICD-10-CM | POA: Diagnosis present

## 2023-11-24 DIAGNOSIS — Z7902 Long term (current) use of antithrombotics/antiplatelets: Secondary | ICD-10-CM

## 2023-11-24 DIAGNOSIS — Z7984 Long term (current) use of oral hypoglycemic drugs: Secondary | ICD-10-CM

## 2023-11-24 DIAGNOSIS — R710 Precipitous drop in hematocrit: Secondary | ICD-10-CM | POA: Diagnosis not present

## 2023-11-24 DIAGNOSIS — K219 Gastro-esophageal reflux disease without esophagitis: Secondary | ICD-10-CM | POA: Diagnosis present

## 2023-11-24 DIAGNOSIS — R1031 Right lower quadrant pain: Secondary | ICD-10-CM | POA: Diagnosis not present

## 2023-11-24 DIAGNOSIS — T81718A Complication of other artery following a procedure, not elsewhere classified, initial encounter: Secondary | ICD-10-CM | POA: Diagnosis not present

## 2023-11-24 DIAGNOSIS — I97638 Postprocedural hematoma of a circulatory system organ or structure following other circulatory system procedure: Secondary | ICD-10-CM | POA: Diagnosis not present

## 2023-11-24 DIAGNOSIS — Z85810 Personal history of malignant neoplasm of tongue: Secondary | ICD-10-CM | POA: Diagnosis not present

## 2023-11-24 DIAGNOSIS — Z923 Personal history of irradiation: Secondary | ICD-10-CM | POA: Diagnosis not present

## 2023-11-24 DIAGNOSIS — Z95828 Presence of other vascular implants and grafts: Secondary | ICD-10-CM

## 2023-11-24 DIAGNOSIS — I6522 Occlusion and stenosis of left carotid artery: Secondary | ICD-10-CM | POA: Diagnosis not present

## 2023-11-24 DIAGNOSIS — Z7982 Long term (current) use of aspirin: Secondary | ICD-10-CM

## 2023-11-24 DIAGNOSIS — F32A Depression, unspecified: Secondary | ICD-10-CM | POA: Diagnosis present

## 2023-11-24 HISTORY — PX: LOWER EXTREMITY ANGIOGRAPHY: CATH118251

## 2023-11-24 HISTORY — PX: CAROTID PTA/STENT INTERVENTION: CATH118231

## 2023-11-24 LAB — CBC
HCT: 26.9 % — ABNORMAL LOW (ref 36.0–46.0)
Hemoglobin: 8.6 g/dL — ABNORMAL LOW (ref 12.0–15.0)
MCH: 29.7 pg (ref 26.0–34.0)
MCHC: 32 g/dL (ref 30.0–36.0)
MCV: 92.8 fL (ref 80.0–100.0)
Platelets: 226 K/uL (ref 150–400)
RBC: 2.9 MIL/uL — ABNORMAL LOW (ref 3.87–5.11)
RDW: 14 % (ref 11.5–15.5)
WBC: 8.9 K/uL (ref 4.0–10.5)
nRBC: 0 % (ref 0.0–0.2)

## 2023-11-24 LAB — MRSA NEXT GEN BY PCR, NASAL: MRSA by PCR Next Gen: NOT DETECTED

## 2023-11-24 LAB — GLUCOSE, CAPILLARY
Glucose-Capillary: 83 mg/dL (ref 70–99)
Glucose-Capillary: 107 mg/dL — ABNORMAL HIGH (ref 70–99)

## 2023-11-24 LAB — CREATININE, SERUM
Creatinine, Ser: 0.92 mg/dL (ref 0.44–1.00)
GFR, Estimated: >60 mL/min (ref >60)

## 2023-11-24 LAB — BUN: BUN: 11 mg/dL (ref 8–23)

## 2023-11-24 SURGERY — LOWER EXTREMITY ANGIOGRAPHY
Anesthesia: Moderate Sedation | Laterality: Right

## 2023-11-24 SURGERY — CAROTID PTA/STENT INTERVENTION
Anesthesia: Moderate Sedation

## 2023-11-24 MED ORDER — LIDOCAINE-EPINEPHRINE (PF) 1 %-1:200000 IJ SOLN
INTRAMUSCULAR | Status: DC | PRN
  Administered 2023-11-24: 10 mL

## 2023-11-24 MED ORDER — FENTANYL CITRATE (PF) 100 MCG/2ML IJ SOLN
INTRAMUSCULAR | Status: AC
  Filled 2023-11-24: qty 2

## 2023-11-24 MED ORDER — ACETAMINOPHEN 325 MG RE SUPP: 325.0000 mg | RECTAL | Status: DC | PRN

## 2023-11-24 MED ORDER — SODIUM CHLORIDE 0.9 % IV SOLN: INTRAVENOUS | Status: DC

## 2023-11-24 MED ORDER — DOPAMINE-DEXTROSE 3.2-5 MG/ML-% IV SOLN
INTRAVENOUS | Status: AC
Start: 2023-11-24 — End: 2023-11-24
  Filled 2023-11-24: qty 250

## 2023-11-24 MED ORDER — METOPROLOL SUCCINATE ER 25 MG PO TB24
25.0000 mg | ORAL_TABLET | Freq: Every day | ORAL | Status: DC
  Administered 2023-11-25 – 2023-12-01 (×5): 25 mg via ORAL
  Filled 2023-11-24 (×7): qty 1

## 2023-11-24 MED ORDER — ONDANSETRON HCL 4 MG/2ML IJ SOLN
4.0000 mg | Freq: Four times a day (QID) | INTRAMUSCULAR | Status: DC | PRN
  Administered 2023-11-24: 4 mg via INTRAVENOUS

## 2023-11-24 MED ORDER — IOHEXOL 300 MG/ML  SOLN
100.0000 mL | Freq: Once | INTRAMUSCULAR | Status: AC | PRN
  Administered 2023-11-24: 100 mL via INTRAVENOUS

## 2023-11-24 MED ORDER — PHENOL 1.4 % MT LIQD: 1.0000 | OROMUCOSAL | Status: DC | PRN

## 2023-11-24 MED ORDER — MIDAZOLAM HCL (PF) 2 MG/2ML IJ SOLN
INTRAMUSCULAR | Status: DC | PRN
  Administered 2023-11-24 (×3): 1 mg via INTRAVENOUS

## 2023-11-24 MED ORDER — IODIXANOL 320 MG/ML IV SOLN
INTRAVENOUS | Status: DC | PRN
  Administered 2023-11-24: 105 mL via INTRA_ARTERIAL

## 2023-11-24 MED ORDER — CEFAZOLIN SODIUM-DEXTROSE 2-4 GM/100ML-% IV SOLN
INTRAVENOUS | Status: AC
  Filled 2023-11-24: qty 100

## 2023-11-24 MED ORDER — PHENYLEPHRINE HCL-NACL 20-0.9 MG/250ML-% IV SOLN
INTRAVENOUS | Status: AC
  Filled 2023-11-24: qty 250

## 2023-11-24 MED ORDER — ASPIRIN 81 MG PO CHEW
81.0000 mg | CHEWABLE_TABLET | Freq: Every day | ORAL | Status: DC
  Administered 2023-11-25 – 2023-12-01 (×7): 81 mg via ORAL
  Filled 2023-11-24 (×7): qty 1

## 2023-11-24 MED ORDER — DOCUSATE SODIUM 100 MG PO CAPS
100.0000 mg | ORAL_CAPSULE | ORAL | Status: DC
  Administered 2023-11-25 – 2023-12-01 (×4): 100 mg via ORAL
  Filled 2023-11-24 (×5): qty 1

## 2023-11-24 MED ORDER — MIDAZOLAM HCL 5 MG/5ML IJ SOLN
INTRAMUSCULAR | Status: AC
  Filled 2023-11-24: qty 5

## 2023-11-24 MED ORDER — ATORVASTATIN CALCIUM 20 MG PO TABS
80.0000 mg | ORAL_TABLET | Freq: Every day | ORAL | Status: DC
  Administered 2023-11-24 – 2023-12-01 (×8): 80 mg via ORAL
  Filled 2023-11-24 (×3): qty 4
  Filled 2023-11-24 (×2): qty 1
  Filled 2023-11-24: qty 4
  Filled 2023-11-24 (×2): qty 1

## 2023-11-24 MED ORDER — HEPARIN (PORCINE) IN NACL 2000-0.9 UNIT/L-% IV SOLN
INTRAVENOUS | Status: DC | PRN
  Administered 2023-11-24: 1000 mL

## 2023-11-24 MED ORDER — MIDAZOLAM HCL (PF) 2 MG/2ML IJ SOLN
INTRAMUSCULAR | Status: DC | PRN
  Administered 2023-11-24: .5 mg via INTRAVENOUS
  Administered 2023-11-24: 2 mg via INTRAVENOUS

## 2023-11-24 MED ORDER — IODIXANOL 320 MG/ML IV SOLN
INTRAVENOUS | Status: DC | PRN
  Administered 2023-11-24: 50 mL

## 2023-11-24 MED ORDER — DIPHENHYDRAMINE HCL 50 MG/ML IJ SOLN: 50.0000 mg | Freq: Once | INTRAMUSCULAR | Status: DC | PRN

## 2023-11-24 MED ORDER — FAMOTIDINE 20 MG PO TABS: 40.0000 mg | ORAL_TABLET | Freq: Once | ORAL | Status: DC | PRN

## 2023-11-24 MED ORDER — ALPRAZOLAM 0.5 MG PO TABS: 1.0000 mg | ORAL_TABLET | Freq: Two times a day (BID) | ORAL | Status: DC | PRN

## 2023-11-24 MED ORDER — SODIUM CHLORIDE 0.9 % IV SOLN
500.0000 mL | Freq: Once | INTRAVENOUS | Status: AC | PRN
  Administered 2023-11-24: 500 mL via INTRAVENOUS

## 2023-11-24 MED ORDER — CEFAZOLIN SODIUM-DEXTROSE 2-4 GM/100ML-% IV SOLN
2.0000 g | INTRAVENOUS | Status: AC
  Administered 2023-11-24: 2 g via INTRAVENOUS

## 2023-11-24 MED ORDER — SODIUM CHLORIDE 0.9 % IV SOLN: INTRAVENOUS | Status: AC

## 2023-11-24 MED ORDER — POTASSIUM CHLORIDE CRYS ER 20 MEQ PO TBCR
40.0000 meq | EXTENDED_RELEASE_TABLET | Freq: Every day | ORAL | Status: DC | PRN
  Administered 2023-11-26: 60 meq via ORAL
  Filled 2023-11-24: qty 3

## 2023-11-24 MED ORDER — METOPROLOL TARTRATE 5 MG/5ML IV SOLN: 2.5000 mg | INTRAVENOUS | Status: DC | PRN

## 2023-11-24 MED ORDER — ACETAMINOPHEN 325 MG PO TABS: 325.0000 mg | ORAL_TABLET | ORAL | Status: DC | PRN

## 2023-11-24 MED ORDER — HEPARIN SODIUM (PORCINE) 1000 UNIT/ML IJ SOLN
INTRAMUSCULAR | Status: DC | PRN
  Administered 2023-11-24: 3000 [IU] via INTRAVENOUS

## 2023-11-24 MED ORDER — CEFAZOLIN SODIUM-DEXTROSE 2-4 GM/100ML-% IV SOLN
2.0000 g | Freq: Three times a day (TID) | INTRAVENOUS | Status: AC
  Administered 2023-11-24 – 2023-11-25 (×2): 2 g via INTRAVENOUS
  Filled 2023-11-24 (×2): qty 100

## 2023-11-24 MED ORDER — GABAPENTIN 300 MG PO CAPS
300.0000 mg | ORAL_CAPSULE | Freq: Two times a day (BID) | ORAL | Status: DC
  Administered 2023-11-24 – 2023-12-01 (×15): 300 mg via ORAL
  Filled 2023-11-24 (×15): qty 1

## 2023-11-24 MED ORDER — HEPARIN (PORCINE) IN NACL 1000-0.9 UT/500ML-% IV SOLN
INTRAVENOUS | Status: DC | PRN
  Administered 2023-11-24: 2000 mL

## 2023-11-24 MED ORDER — METHYLPREDNISOLONE SODIUM SUCC 125 MG IJ SOLR: 125.0000 mg | Freq: Once | INTRAMUSCULAR | Status: DC | PRN

## 2023-11-24 MED ORDER — SODIUM CHLORIDE 0.9 % IV SOLN
500.0000 mL | Freq: Once | INTRAVENOUS | Status: AC | PRN
  Administered 2023-11-25: 500 mL via INTRAVENOUS

## 2023-11-24 MED ORDER — HEPARIN SODIUM (PORCINE) 1000 UNIT/ML IJ SOLN
INTRAMUSCULAR | Status: AC
Start: 2023-11-24 — End: 2023-11-24
  Filled 2023-11-24: qty 10

## 2023-11-24 MED ORDER — MORPHINE SULFATE (PF) 2 MG/ML IV SOLN
2.0000 mg | INTRAVENOUS | Status: DC | PRN
  Administered 2023-11-24 – 2023-11-28 (×22): 2 mg via INTRAVENOUS
  Administered 2023-11-28: 5 mg via INTRAVENOUS
  Administered 2023-11-28: 2 mg via INTRAVENOUS
  Administered 2023-11-28: 4 mg via INTRAVENOUS
  Administered 2023-11-29 – 2023-12-01 (×10): 2 mg via INTRAVENOUS
  Administered 2023-12-01: 4 mg via INTRAVENOUS
  Filled 2023-11-24 (×2): qty 1
  Filled 2023-11-24: qty 3
  Filled 2023-11-24: qty 1
  Filled 2023-11-24: qty 2
  Filled 2023-11-24: qty 1
  Filled 2023-11-24: qty 3
  Filled 2023-11-24 (×3): qty 1
  Filled 2023-11-24: qty 2
  Filled 2023-11-24 (×26): qty 1

## 2023-11-24 MED ORDER — SERTRALINE HCL 50 MG PO TABS
50.0000 mg | ORAL_TABLET | Freq: Every day | ORAL | Status: DC
  Administered 2023-11-24 – 2023-12-01 (×8): 50 mg via ORAL
  Filled 2023-11-24 (×8): qty 1

## 2023-11-24 MED ORDER — ATROPINE SULFATE 1 MG/10ML IJ SOSY
PREFILLED_SYRINGE | INTRAMUSCULAR | Status: AC
  Filled 2023-11-24: qty 20

## 2023-11-24 MED ORDER — HEPARIN SODIUM (PORCINE) 1000 UNIT/ML IJ SOLN
INTRAMUSCULAR | Status: DC | PRN
  Administered 2023-11-24: 8000 [IU] via INTRAVENOUS
  Administered 2023-11-24: 2000 [IU] via INTRAVENOUS

## 2023-11-24 MED ORDER — TICAGRELOR 90 MG PO TABS
90.0000 mg | ORAL_TABLET | Freq: Two times a day (BID) | ORAL | Status: DC
  Administered 2023-11-25 – 2023-12-01 (×13): 90 mg via ORAL
  Filled 2023-11-24 (×13): qty 1

## 2023-11-24 MED ORDER — TICAGRELOR 90 MG PO TABS: 90.0000 mg | ORAL_TABLET | Freq: Two times a day (BID) | ORAL | Status: DC

## 2023-11-24 MED ORDER — PHENYLEPHRINE 80 MCG/ML (10ML) SYRINGE FOR IV PUSH (FOR BLOOD PRESSURE SUPPORT)
PREFILLED_SYRINGE | INTRAVENOUS | Status: AC
  Filled 2023-11-24: qty 10

## 2023-11-24 MED ORDER — LABETALOL HCL 5 MG/ML IV SOLN: 10.0000 mg | INTRAVENOUS | Status: DC | PRN

## 2023-11-24 MED ORDER — ONDANSETRON HCL 4 MG/2ML IJ SOLN
INTRAMUSCULAR | Status: AC
  Filled 2023-11-24: qty 2

## 2023-11-24 MED ORDER — ATROPINE SULFATE 1 MG/10ML IJ SOSY
PREFILLED_SYRINGE | INTRAMUSCULAR | Status: DC | PRN
  Administered 2023-11-24: 1 mg via INTRAVENOUS

## 2023-11-24 MED ORDER — FENTANYL CITRATE (PF) 50 MCG/ML IJ SOSY
PREFILLED_SYRINGE | INTRAMUSCULAR | Status: AC
  Filled 2023-11-24: qty 1

## 2023-11-24 MED ORDER — VITAMIN D (ERGOCALCIFEROL) 1.25 MG (50000 UNIT) PO CAPS
50000.0000 [IU] | ORAL_CAPSULE | ORAL | Status: DC
  Administered 2023-11-24: 50000 [IU] via ORAL
  Filled 2023-11-24 (×2): qty 1

## 2023-11-24 MED ORDER — HYDROMORPHONE HCL 1 MG/ML IJ SOLN: 1.0000 mg | Freq: Once | INTRAMUSCULAR | Status: DC | PRN

## 2023-11-24 MED ORDER — MIDAZOLAM HCL 2 MG/ML PO SYRP: 8.0000 mg | ORAL_SOLUTION | Freq: Once | ORAL | Status: DC | PRN

## 2023-11-24 MED ORDER — FENTANYL CITRATE (PF) 100 MCG/2ML IJ SOLN
INTRAMUSCULAR | Status: DC | PRN
  Administered 2023-11-24 (×2): 25 ug via INTRAVENOUS
  Administered 2023-11-24: 50 ug via INTRAVENOUS

## 2023-11-24 MED ORDER — HYDRALAZINE HCL 20 MG/ML IJ SOLN: 5.0000 mg | INTRAMUSCULAR | Status: DC | PRN

## 2023-11-24 MED ORDER — FENTANYL CITRATE (PF) 100 MCG/2ML IJ SOLN
INTRAMUSCULAR | Status: DC | PRN
  Administered 2023-11-24: 25 ug via INTRAVENOUS
  Administered 2023-11-24: 50 ug via INTRAVENOUS
  Administered 2023-11-24: 25 ug via INTRAVENOUS

## 2023-11-24 MED ORDER — ONDANSETRON HCL 4 MG/2ML IJ SOLN: 4.0000 mg | Freq: Four times a day (QID) | INTRAMUSCULAR | Status: DC | PRN

## 2023-11-24 MED ORDER — ISOSORBIDE MONONITRATE ER 30 MG PO TB24
15.0000 mg | ORAL_TABLET | Freq: Every day | ORAL | Status: DC
  Administered 2023-11-25 – 2023-12-01 (×7): 15 mg via ORAL
  Filled 2023-11-24 (×7): qty 1

## 2023-11-24 MED ORDER — MIDAZOLAM HCL 2 MG/2ML IJ SOLN
INTRAMUSCULAR | Status: AC
  Filled 2023-11-24: qty 4

## 2023-11-24 MED ORDER — MECLIZINE HCL 25 MG PO TABS: 12.5000 mg | ORAL_TABLET | Freq: Three times a day (TID) | ORAL | Status: DC | PRN

## 2023-11-24 SURGICAL SUPPLY — 27 items
BALLOON LUTONIX 018 5X40X130 (BALLOONS) IMPLANT
BALLOON LUTONIX 018 6X40X130 (BALLOONS) IMPLANT
BALLOON LUTONIX AV 8X60X75 (BALLOONS) IMPLANT
BALLOON LUTONIX DCB 7X60X130 (BALLOONS) IMPLANT
CATH MICROCATH PRGRT 2.8F 110 (CATHETERS) IMPLANT
CATH SEEKER .035X90 (CATHETERS) IMPLANT
CATH TEMPO 5F RIM 65CM (CATHETERS) IMPLANT
COIL 400 COMPLEX STD 4X15CM (Vascular Products) IMPLANT
COVER PROBE ULTRASOUND 5X96 (MISCELLANEOUS) IMPLANT
DEVICE OCCLUSION PODJ5 (Embolic) IMPLANT
DEVICE PRESTO INFLATION (MISCELLANEOUS) IMPLANT
DEVICE VASC CLSR CELT ART 6 (Vascular Products) IMPLANT
DEVICE VASC CLSR CELT ART 7 (Vascular Products) IMPLANT
GLIDEWIRE ADV .035X260CM (WIRE) IMPLANT
GUIDEWIRE GT SS DBLE ANG 0.016 (WIRE) IMPLANT
GUIDEWIRE PFTE-COATED .018X300 (WIRE) IMPLANT
HANDLE DETACHMENT COIL (MISCELLANEOUS) IMPLANT
PACK ANGIOGRAPHY (CUSTOM PROCEDURE TRAY) ×1 IMPLANT
SHEATH ANL2 6FRX45 HC (SHEATH) IMPLANT
SHEATH BRITE TIP 5FRX11 (SHEATH) IMPLANT
SHEATH BRITE TIP 6FRX11 (SHEATH) IMPLANT
SHEATH BRITE TIP 7FRX11 (SHEATH) IMPLANT
SHEATH FLEXOR ANSEL2 7FRX45 (SHEATH) IMPLANT
STENT VIABAHN 6X25X120 (Permanent Stent) IMPLANT
STENT VIABAHN 8X50X120 (Permanent Stent) IMPLANT
WIRE G V18X300CM (WIRE) IMPLANT
WIRE J 3MM .035X145CM (WIRE) IMPLANT

## 2023-11-24 SURGICAL SUPPLY — 18 items
BALLOON VTRAC 4.5X30X135 (BALLOONS) IMPLANT
CATH ANGIO 5F PIGTAIL 100CM (CATHETERS) IMPLANT
CATH BEACON 5 .035 100 H1 TIP (CATHETERS) IMPLANT
COVER PROBE ULTRASOUND 5X96 (MISCELLANEOUS) IMPLANT
DEVICE EMBOSHIELD NAV6 4.0-7.0 (FILTER) IMPLANT
DEVICE PRESTO INFLATION (MISCELLANEOUS) IMPLANT
DEVICE STARCLOSE SE CLOSURE (Vascular Products) IMPLANT
DEVICE TORQUE (MISCELLANEOUS) IMPLANT
GLIDEWIRE ANGLED SS 035X260CM (WIRE) IMPLANT
KIT CAROTID MANIFOLD (MISCELLANEOUS) IMPLANT
PACK ANGIOGRAPHY (CUSTOM PROCEDURE TRAY) ×1 IMPLANT
SHEATH BRITE TIP 6FRX11 (SHEATH) IMPLANT
SHEATH SHUTTLE 6FRX80 (SHEATH) IMPLANT
STENT XACT CAR 9-7X40X136 (Permanent Stent) IMPLANT
SUT MNCRL AB 4-0 PS2 18 (SUTURE) IMPLANT
SYR MEDRAD MARK 7 150ML (SYRINGE) IMPLANT
WIRE G VAS 035X260 STIFF (WIRE) IMPLANT
WIRE J 3MM .035X145CM (WIRE) IMPLANT

## 2023-11-24 NOTE — Progress Notes (Signed)
 Patient was doing well neurologically after Connie Cameron carotid stent but about 1 to 2 hours after Connie Cameron procedure began developing a hematoma in the right groin.  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  Plan on bringing Connie Cameron back for angiogram for evaluation likely treatment with covered stent placement.

## 2023-11-24 NOTE — Interval H&P Note (Signed)
 History and Physical Interval Note:  11/24/2023 4:14 PM  Connie Cameron  has presented today for surgery, with the diagnosis of Femoral bleeding.  The various methods of treatment have been discussed with the patient and family. After consideration of risks, benefits and other options for treatment, the patient has consented to  Procedure(s): Lower Extremity Angiography (Right) as a surgical intervention.  The patient's history has been reviewed, patient examined, no change in status, stable for surgery.  I have reviewed the patient's chart and labs.  Questions were answered to the patient's satisfaction.     [name]

## 2023-11-24 NOTE — Progress Notes (Addendum)
 1215 patient arrived to unit hematoma noted to right groin 10cm x15cm soft to the touch no noticeable bleeding noted. PER Vascular surgeon ok to hold blood thinner at this time and will start 11/25/2023 1320 hematoma noticeable larger and patient reporting pain new orders for STAT scans 1345 patient taken to have CT scans  1513 CT un resulted IR called to get report for patient to return back to IR. Report given and patient transported to specials room 22 alert x4 on room air  1900 patient arrived back to unit bolus running patient alert x4 on room air

## 2023-11-24 NOTE — H&P (View-Only) (Signed)
 Patient was doing well neurologically after her carotid stent but about 1 to 2 hours after her procedure began developing a hematoma in the right groin.  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  Plan on bringing her back for angiogram for evaluation likely treatment with covered stent placement.

## 2023-11-24 NOTE — Interval H&P Note (Signed)
 History and Physical Interval Note:  11/24/2023 8:22 AM  Connie Cameron  has presented today for surgery, with the diagnosis of L Carotid Stent   ABBOTT   Carotid artery stenosis.  The various methods of treatment have been discussed with the patient and family. After consideration of risks, benefits and other options for treatment, the patient has consented to  Procedure(s): CAROTID PTA/STENT INTERVENTION (N/A) as a surgical intervention.  The patient's history has been reviewed, patient examined, no change in status, stable for surgery.  I have reviewed the patient's chart and labs.  Questions were answered to the patient's satisfaction.     [name]

## 2023-11-24 NOTE — Progress Notes (Signed)
 Patient states, I feel funny. Lightheaded. Nauseous. Put patient in trendelenburg position. Gave patient 500 mL bolus NS. BP very low. Right groin swelled again. Just help pressure for 15 minutes and it is soft again. BP q 5 mins and is back to normal baseline.

## 2023-11-24 NOTE — Op Note (Signed)
 [facility]  Percutaneous Study/Intervention Procedural Note   Date of Surgery: 11/24/2023  Surgeon(s):Hayly Litsey    Assistants:none  Pre-operative Diagnosis: Pseudoaneurysm right femoral artery  Post-operative diagnosis:  Same  Procedure(s) Performed:             1.  Ultrasound guidance for vascular access left femoral artery             2.  Catheter placement into right SFA and right profunda femoris artery from left femoral approach             3.  Angiography of the right lower extremity including selective images of the right profunda femoris artery and superficial femoral artery as well as sidebranches             4.  Covered stent placement to the right common femoral artery with 6 mm diameter by 2.5 cm length Viabahn stent and 8 mm diameter by 5 cm length Viabahn stent             5.  Coil embolization of right common femoral artery branches with a 4 mm x 10 cm and a 5 cm packing Ruby coil  6.  Celt closure device left femoral artery  EBL: 25 cc  Contrast: 105 cc  Fluoro Time: 31.5 minutes  Moderate Conscious Sedation Time: approximately 111 minutes using 2.5 mg of Versed  and 100 mcg of Fentanyl               Indications:  Patient is a 63 y.o.female with a pseudoaneurysm in the right femoral artery after carotid stenting earlier in the day. The patient had a CT scan concerning for active extravasation and pseudoaneurysm.  The patient is brought in for angiography for further evaluation and potential treatment. Risks and benefits are discussed and informed consent is obtained.   Procedure:  The patient was identified and appropriate procedural time out was performed.  The patient was then placed supine on the table and prepped and draped in the usual sterile fashion. Moderate conscious sedation was administered during a face to face encounter with the patient throughout the procedure with my supervision of the RN administering medicines and monitoring the  patient's vital signs, pulse oximetry, telemetry and mental status throughout from the start of the procedure until the patient was taken to the recovery room. Ultrasound was used to evaluate the left common femoral artery.  It was patent .  A digital ultrasound image was acquired.  A Seldinger needle was used to access the left common femoral artery under direct ultrasound guidance and a permanent image was performed.  A 0.035 J wire was advanced without resistance and a 5Fr sheath was placed. I then crossed the aortic bifurcation and advanced to the right femoral head. Selective right lower extremity angiogram was then performed. This demonstrated what appeared to be bleeding directly from the access site at the location of the StarClose closure device.  There is no significant stenotic disease.  There were multiple side branches off of the common femoral artery as well as the profunda femoris and proximal SFA and there were a lot of sidebranches in the area as well.  I elected to go ahead and place a covered stent in the common femoral artery.  I upsized to a 6 [place] Ansell sheath.  I then used a Kumpe catheter and the advantage wire to navigate down into the SFA and exchanged for a V18 wire.  I selected a 6 mm diameter by 2.5  cm length Viabahn stent in the right mid to distal common femoral artery.  This was selected and deployed and postdilated with a 6 mm balloon but there was still extravasation and pseudoaneurysm.  It was not clear that there was good wall apposition but there was also a common femoral sidebranch that look like it could be feeding a branch that was actually having the bleeding.  I then got out the sidebranch with a rim catheter and a Progreat microcatheter and perform coil embolization first with a 4 mm diameter by 10 cm length Ruby coil and then a 5 cm packing coil.  The diagnostic catheter was removed and imaging was performed showing successful coil embolization of this branch, but there  remained active extravasation.  I then upsized to an 7 [place] sheath and selected an 8 mm diameter by 5 cm length Viabahn stent and deployed this down to the femoral bifurcation with no further distally could go without going into either the SFA or the profunda femoris artery and expanding at about 2 cm proximally.  This was postdilated first with a 7 mm balloon and then an 8 mm balloon.  Although the pseudoaneurysm flow appeared smaller, there was still flow outside of the artery.  I then put a catheter down into the SFA and the profunda femoris artery to try to see if there was a obvious bleeding vessel off of these vessels.  Selective imaging of both the SFA and profunda femoris artery were performed.  It was very difficult to discern but there may have been a very proximal branch at the origin of the SFA that appeared as if it would be covered by the stent but was flowing up and could have been the area of bleeding.  I then expanded a 5 mm diameter balloon in the right profunda femoris artery and performed imaging through the sheath.  With the balloon up in the profunda femoris artery no extravasation was seen.  This branch was at the distal edge of the previously placed 8 mm stent and was immediate upgoing making cannulation impossible despite multiple attempts with several different wires and catheters.  With manipulation of this artery as well as likely some motion of the stent imaging after attempting to cannulate this tiny proximal profunda femoris branch showed no further flow in this branch and no extravasation.  At this point, if she has further bleeding surgical repair will likely be necessary.  I felt this would be less morbid than double barrel covered stents into the SFA and profunda femoris.  For now, she had remained stable and the extravasation seems to have stopped.  I elected to terminate the procedure. The sheath was removed and Celt closure device was deployed in the left femoral artery with  excellent hemostatic result. The patient was taken to the recovery room in stable condition having tolerated the procedure well.     Disposition: Patient was taken to the recovery room in stable condition having tolerated the procedure well.  Complications: None  Selinda Gu 11/24/2023 6:30 PM   This note was created with Dragon Medical transcription system. Any errors in dictation are purely unintentional.

## 2023-11-24 NOTE — Op Note (Signed)
 OPERATIVE NOTE DATE: 11/24/2023  PROCEDURE:  Ultrasound guidance for vascular access right femoral artery  Placement of a 9 mm proximal, 7 mm distal, 4 cm long exact stent with the use of the NAV-6 embolic protection device in the left carotid artery  PRE-OPERATIVE DIAGNOSIS: 1.  High-grade left carotid artery stenosis. 2.  Previous neck radiation  POST-OPERATIVE DIAGNOSIS:  Same as above  SURGEON: Selinda Gu, MD  ASSISTANT(S): None  ANESTHESIA: local/MCS  ESTIMATED BLOOD LOSS: 10 cc  CONTRAST: 50 cc  FLUORO TIME: 3.1 minutes  MODERATE CONSCIOUS SEDATION TIME:  Approximately 43 minutes using 3 mg of Versed  and 100 mcg of Fentanyl   FINDING(S): 1.   80 to 85% carotid artery stenosis  SPECIMEN(S):   none  INDICATIONS:   Patient is a 63 y.o. female who presents with high-grade left carotid artery stenosis.  The patient has had previous neck radiation as well as a high lesion and carotid artery stenting was felt to be preferred to endarterectomy for that reason. I have completed Share Decision Making with Elveria DELENA Ernst prior to surgery.  Conversations included: -Discussion of all treatment options including carotid endarterectomy (CEA), CAS (which includes transcarotid artery revascularization (TCAR)), and optimal medical therapy (OMT)). -Explanation of risks and benefits for each option specific to [facility]'s clinical situation. -Integration of clinical guidelines as it relates to the patient's history and co-morbidities -Discussion and incorporation of Elveria DELENA Ernst and their personal preferences and priorities in choosing a treatment plan.  If patient was unable to participate in Shared Decision Making this process was done with the patient  Risks and benefits were discussed and informed consent was obtained.   DESCRIPTION: After obtaining full informed written consent, the patient was brought back to the vascular suite and placed supine upon the table.  The  patient received IV antibiotics prior to induction. Moderate conscious sedation was administered during a face to face encounter with the patient throughout the procedure with my supervision of the RN administering medicines and monitoring the patients vital signs and mental status throughout from the start of the procedure until the patient was taken to the recovery room.  After obtaining adequate anesthesia, the patient was prepped and draped in the standard fashion.   The right femoral artery was visualized with ultrasound and found to be widely patent. It was then accessed under direct ultrasound guidance without difficulty with a Seldinger needle. A permanent image was recorded. A J-wire was placed and we then placed a 6 French sheath. The patient was then heparinized and a total of 10,000 units of intravenous heparin  were given and an ACT was checked to confirm successful anticoagulation. A pigtail catheter was then placed into the ascending aorta. This showed a type I aortic arch with no proximal stenosis. I then selectively cannulated the left common carotid artery without difficulty with a headhunter catheter and advanced into the mid left common carotid artery.  Cervical and cerebral carotid angiography was then performed. There were no obvious intracranial filling defects with a slight amount of cross-filling left to right. The carotid bifurcation demonstrated a stenosis about a centimeter and a half above the bifurcation.  In the LAO projection this appeared to be in the 70 to 75% range.  When I transition to the lateral projection this appeared to be in the 85% range.  I then advanced into the external carotid artery with a Glidewire and the headhunter catheter and then exchanged for the Amplatz Super Stiff wire. Over  the Amplatz Super Stiff wire, a 6 [place] shuttle sheath was placed into the mid common carotid artery. I then used the NAV-6  Embolic protection device and crossed the lesion and parked  this in the distal internal carotid artery at the base of the skull.  I then selected a 9 mm proximal, 7 mm distal, 4 cm long exact stent. This was deployed across the lesion encompassing it in its entirety. A 4.5 mm diameter by 3 cm length balloon was used to post dilate the stent. Only about a 15% residual stenosis was present after angioplasty. Completion angiogram showed normal intracranial filling without new defects. At this point I elected to terminate the procedure. The sheath was removed and StarClose closure device was deployed in the right femoral artery with excellent hemostatic result. The patient was taken to the recovery room in stable condition having tolerated the procedure well.  COMPLICATIONS: none  CONDITION: stable  Selinda Gu 11/24/2023 10:59 AM   This note was created with Dragon Medical transcription system. Any errors in dictation are purely unintentional.

## 2023-11-25 ENCOUNTER — Encounter: Payer: Self-pay | Admitting: Vascular Surgery

## 2023-11-25 LAB — CBC
HCT: 26.5 % — ABNORMAL LOW (ref 36.0–46.0)
HCT: 23 % — ABNORMAL LOW (ref 36.0–46.0)
Hemoglobin: 8.1 g/dL — ABNORMAL LOW (ref 12.0–15.0)
Hemoglobin: 7.5 g/dL — ABNORMAL LOW (ref 12.0–15.0)
MCH: 28.6 pg (ref 26.0–34.0)
MCH: 29.9 pg (ref 26.0–34.0)
MCHC: 30.6 g/dL (ref 30.0–36.0)
MCHC: 32.6 g/dL (ref 30.0–36.0)
MCV: 93.6 fL (ref 80.0–100.0)
MCV: 91.6 fL (ref 80.0–100.0)
Platelets: 195 K/uL (ref 150–400)
Platelets: 200 K/uL (ref 150–400)
RBC: 2.83 MIL/uL — ABNORMAL LOW (ref 3.87–5.11)
RBC: 2.51 MIL/uL — ABNORMAL LOW (ref 3.87–5.11)
RDW: 14.1 % (ref 11.5–15.5)
RDW: 14 % (ref 11.5–15.5)
WBC: 7 K/uL (ref 4.0–10.5)
WBC: 7.4 K/uL (ref 4.0–10.5)
nRBC: 0 % (ref 0.0–0.2)
nRBC: 0 % (ref 0.0–0.2)

## 2023-11-25 LAB — BASIC METABOLIC PANEL WITH GFR
Anion gap: 10 (ref 5–15)
BUN: 10 mg/dL (ref 8–23)
CO2: 20 mmol/L — ABNORMAL LOW (ref 22–32)
Calcium: 8.1 mg/dL — ABNORMAL LOW (ref 8.9–10.3)
Chloride: 108 mmol/L (ref 98–111)
Creatinine, Ser: 0.8 mg/dL (ref 0.44–1.00)
GFR, Estimated: >60 mL/min (ref >60)
Glucose, Bld: 107 mg/dL — ABNORMAL HIGH (ref 70–99)
Potassium: 3.5 mmol/L (ref 3.5–5.1)
Sodium: 138 mmol/L (ref 135–145)

## 2023-11-25 LAB — POCT ACTIVATED CLOTTING TIME: Activated Clotting Time: 268 s

## 2023-11-25 NOTE — Plan of Care (Signed)

## 2023-11-25 NOTE — Progress Notes (Signed)
 [facility]  Daily Progress Note   Subjective  -   Patient still having significant right groin pain.  Blood pressure has been stable in the 90s and 100s which is near baseline for her.  No neurologic changes.  No major events overnight after her treatment of her right femoral artery pseudoaneurysm  Objective Vitals:   11/25/23 0800 11/25/23 1000 11/25/23 1100 11/25/23 1200  BP: (!) 93/58 (!) 100/56 (!) 93/58   Pulse: 73 70 72   Resp: 16 14 13    Temp: 98.7 F (37.1 C) 99.1 F (37.3 C)  98.1 F (36.7 C)  TempSrc: Oral Oral  Oral  SpO2: 97% 96% 97%   Weight:      Height:        Intake/Output Summary (Last 24 hours) at 11/25/2023 1241 Last data filed at 11/25/2023 0630 Gross per 24 hour  Intake 2012.23 ml  Output 525 ml  Net 1487.23 ml    PULM  CTAB CV  RRR VASC  right groin bruised and mildly tender as expected.  Hematoma does not appear to be expanding.  Laboratory CBC    Component Value Date/Time   WBC 7.0 11/25/2023 0805   HGB 8.1 (L) 11/25/2023 0805   HGB 11.8 11/06/2023 1151   HCT 26.5 (L) 11/25/2023 0805   HCT 37.3 11/06/2023 1151   PLT 195 11/25/2023 0805   PLT 221 11/06/2023 1151    BMET    Component Value Date/Time   NA 138 11/25/2023 0413   NA 145 (H) 11/06/2023 1151   NA 140 06/24/2011 1709   K 3.5 11/25/2023 0413   K 4.3 06/24/2011 1709   CL 108 11/25/2023 0413   CL 105 06/24/2011 1709   CO2 20 (L) 11/25/2023 0413   CO2 30 06/24/2011 1709   GLUCOSE 107 (H) 11/25/2023 0413   GLUCOSE 101 (H) 06/24/2011 1709   BUN 10 11/25/2023 0413   BUN 14 11/06/2023 1151   BUN 17 06/24/2011 1709   CREATININE 0.80 11/25/2023 0413   CREATININE 1.11 (H) 05/30/2023 1258   CREATININE 0.91 06/24/2011 1709   CALCIUM  8.1 (L) 11/25/2023 0413   CALCIUM  9.4 06/24/2011 1709   GFRNONAA >60 11/25/2023 0413   GFRNONAA 56 (L) 05/30/2023 1258   GFRNONAA >60 06/24/2011 1709   GFRAA >60 07/28/2019 1048   GFRAA >60 06/24/2011 1709     Assessment/Planning: POD # 1 s/p left carotid and subsequent endovascular intervention for right femoral artery pseudoaneurysm  Hemoglobin has drifted down but not dramatically.  The overall drop is about 3 g.  She certainly had significant hematoma in the right groin and that would be expected. Does not appear to have continued bleeding.  I think it is okay to feed her today and get her out of bed. I will continue to monitor her CBC and do not plan discharge today.    Selinda Gu  11/25/2023, 12:41 PM

## 2023-11-26 ENCOUNTER — Inpatient Hospital Stay: Admitting: Anesthesiology

## 2023-11-26 ENCOUNTER — Encounter
Admission: RE | Disposition: A | Discharge status: Home Health | Payer: Self-pay | Source: Home / Self Care | Attending: Vascular Surgery

## 2023-11-26 DIAGNOSIS — Z9889 Other specified postprocedural states: Secondary | ICD-10-CM | POA: Diagnosis not present

## 2023-11-26 DIAGNOSIS — I97638 Postprocedural hematoma of a circulatory system organ or structure following other circulatory system procedure: Secondary | ICD-10-CM | POA: Diagnosis not present

## 2023-11-26 DIAGNOSIS — Z95828 Presence of other vascular implants and grafts: Secondary | ICD-10-CM | POA: Diagnosis not present

## 2023-11-26 HISTORY — PX: FEMORAL ARTERY EXPLORATION: SHX5160

## 2023-11-26 LAB — CBC
HCT: 21 % — ABNORMAL LOW (ref 36.0–46.0)
HCT: 26.7 % — ABNORMAL LOW (ref 36.0–46.0)
Hemoglobin: 6.8 g/dL — ABNORMAL LOW (ref 12.0–15.0)
Hemoglobin: 9.1 g/dL — ABNORMAL LOW (ref 12.0–15.0)
MCH: 29.4 pg (ref 26.0–34.0)
MCH: 29.4 pg (ref 26.0–34.0)
MCHC: 32.4 g/dL (ref 30.0–36.0)
MCHC: 34.1 g/dL (ref 30.0–36.0)
MCV: 90.9 fL (ref 80.0–100.0)
MCV: 86.1 fL (ref 80.0–100.0)
Platelets: 165 K/uL (ref 150–400)
Platelets: 194 K/uL (ref 150–400)
RBC: 2.31 MIL/uL — ABNORMAL LOW (ref 3.87–5.11)
RBC: 3.1 MIL/uL — ABNORMAL LOW (ref 3.87–5.11)
RDW: 14.1 % (ref 11.5–15.5)
RDW: 15 % (ref 11.5–15.5)
WBC: 6.3 K/uL (ref 4.0–10.5)
WBC: 9.1 K/uL (ref 4.0–10.5)
nRBC: 0 % (ref 0.0–0.2)
nRBC: 0 % (ref 0.0–0.2)

## 2023-11-26 LAB — BASIC METABOLIC PANEL WITH GFR
Anion gap: 4 — ABNORMAL LOW (ref 5–15)
BUN: 9 mg/dL (ref 8–23)
CO2: 25 mmol/L (ref 22–32)
Calcium: 8.2 mg/dL — ABNORMAL LOW (ref 8.9–10.3)
Chloride: 111 mmol/L (ref 98–111)
Creatinine, Ser: 0.72 mg/dL (ref 0.44–1.00)
GFR, Estimated: >60 mL/min (ref >60)
Glucose, Bld: 107 mg/dL — ABNORMAL HIGH (ref 70–99)
Potassium: 3.3 mmol/L — ABNORMAL LOW (ref 3.5–5.1)
Sodium: 140 mmol/L (ref 135–145)

## 2023-11-26 LAB — ABO/RH: ABO/RH(D): B POS

## 2023-11-26 LAB — PREPARE RBC (CROSSMATCH)

## 2023-11-26 SURGERY — EXPLORATION, ARTERY, FEMORAL
Anesthesia: General | Laterality: Right

## 2023-11-26 MED ORDER — ONDANSETRON HCL 4 MG/2ML IJ SOLN
INTRAMUSCULAR | Status: AC
  Filled 2023-11-26: qty 2

## 2023-11-26 MED ORDER — FENTANYL CITRATE (PF) 100 MCG/2ML IJ SOLN
INTRAMUSCULAR | Status: DC | PRN
  Administered 2023-11-26 (×3): 50 ug via INTRAVENOUS

## 2023-11-26 MED ORDER — CEFAZOLIN SODIUM 1 G IJ SOLR
INTRAMUSCULAR | Status: AC
  Filled 2023-11-26: qty 20

## 2023-11-26 MED ORDER — ACETAMINOPHEN 10 MG/ML IV SOLN
INTRAVENOUS | Status: AC
  Filled 2023-11-26: qty 100

## 2023-11-26 MED ORDER — METOPROLOL TARTRATE 5 MG/5ML IV SOLN
INTRAVENOUS | Status: DC | PRN
Start: 2023-11-26 — End: 2023-11-26
  Administered 2023-11-26: 2 mg via INTRAVENOUS
  Administered 2023-11-26: 1 mg via INTRAVENOUS

## 2023-11-26 MED ORDER — CEFAZOLIN SODIUM-DEXTROSE 2-3 GM-%(50ML) IV SOLR
INTRAVENOUS | Status: DC | PRN
  Administered 2023-11-26: 2 g via INTRAVENOUS

## 2023-11-26 MED ORDER — ONDANSETRON HCL 4 MG/2ML IJ SOLN
INTRAMUSCULAR | Status: DC | PRN
  Administered 2023-11-26: 4 mg via INTRAVENOUS

## 2023-11-26 MED ORDER — HEMOSTATIC AGENTS (NO CHARGE) OPTIME
TOPICAL | Status: DC | PRN
  Administered 2023-11-26: 1 via TOPICAL

## 2023-11-26 MED ORDER — LIDOCAINE HCL (CARDIAC) PF 100 MG/5ML IV SOSY
PREFILLED_SYRINGE | INTRAVENOUS | Status: DC | PRN
  Administered 2023-11-26: 80 mg via INTRAVENOUS

## 2023-11-26 MED ORDER — PROPOFOL 10 MG/ML IV BOLUS
INTRAVENOUS | Status: AC
Start: 2023-11-26 — End: 2023-11-26
  Filled 2023-11-26: qty 20

## 2023-11-26 MED ORDER — VANCOMYCIN HCL 1000 MG IV SOLR
INTRAVENOUS | Status: AC
  Filled 2023-11-26: qty 20

## 2023-11-26 MED ORDER — DEXAMETHASONE SOD PHOSPHATE PF 10 MG/ML IJ SOLN
INTRAMUSCULAR | Status: DC | PRN
  Administered 2023-11-26: 10 mg via INTRAVENOUS

## 2023-11-26 MED ORDER — HYDROCODONE-ACETAMINOPHEN 5-325 MG PO TABS
1.0000 | ORAL_TABLET | Freq: Four times a day (QID) | ORAL | Status: DC | PRN
  Administered 2023-11-27 – 2023-12-01 (×10): 2 via ORAL
  Filled 2023-11-26 (×10): qty 2

## 2023-11-26 MED ORDER — LACTATED RINGERS IV SOLN: INTRAVENOUS | Status: DC | PRN

## 2023-11-26 MED ORDER — MIDAZOLAM HCL 2 MG/2ML IJ SOLN
INTRAMUSCULAR | Status: AC
  Filled 2023-11-26: qty 2

## 2023-11-26 MED ORDER — ACETAMINOPHEN 10 MG/ML IV SOLN
INTRAVENOUS | Status: DC | PRN
  Administered 2023-11-26: 1000 mg via INTRAVENOUS

## 2023-11-26 MED ORDER — GENTAMICIN SULFATE 40 MG/ML IJ SOLN
INTRAMUSCULAR | Status: DC | PRN
  Administered 2023-11-26: 60 mg via INTRAMUSCULAR

## 2023-11-26 MED ORDER — ROCURONIUM BROMIDE 10 MG/ML (PF) SYRINGE
PREFILLED_SYRINGE | INTRAVENOUS | Status: AC
  Filled 2023-11-26: qty 10

## 2023-11-26 MED ORDER — ROCURONIUM BROMIDE 100 MG/10ML IV SOLN
INTRAVENOUS | Status: DC | PRN
  Administered 2023-11-26: 50 mg via INTRAVENOUS

## 2023-11-26 MED ORDER — SODIUM CHLORIDE (PF) 0.9 % IJ SOLN
INTRAMUSCULAR | Status: AC
  Filled 2023-11-26: qty 20

## 2023-11-26 MED ORDER — PHENYLEPHRINE HCL-NACL 20-0.9 MG/250ML-% IV SOLN
INTRAVENOUS | Status: AC
  Filled 2023-11-26: qty 250

## 2023-11-26 MED ORDER — VASHE WOUND IRRIGATION OPTIME
TOPICAL | Status: DC | PRN
  Administered 2023-11-26: 34 [oz_av]

## 2023-11-26 MED ORDER — PHENYLEPHRINE 80 MCG/ML (10ML) SYRINGE FOR IV PUSH (FOR BLOOD PRESSURE SUPPORT)
PREFILLED_SYRINGE | INTRAVENOUS | Status: AC
Start: 2023-11-26 — End: 2023-11-26
  Filled 2023-11-26: qty 10

## 2023-11-26 MED ORDER — OXYCODONE HCL 5 MG PO TABS: 5.0000 mg | ORAL_TABLET | Freq: Once | ORAL | Status: DC | PRN

## 2023-11-26 MED ORDER — SUCCINYLCHOLINE CHLORIDE 200 MG/10ML IV SOSY
PREFILLED_SYRINGE | INTRAVENOUS | Status: DC | PRN
  Administered 2023-11-26: 100 mg via INTRAVENOUS

## 2023-11-26 MED ORDER — VANCOMYCIN HCL 1 G IV SOLR
INTRAVENOUS | Status: DC | PRN
  Administered 2023-11-26: 1000 mg

## 2023-11-26 MED ORDER — PROPOFOL 10 MG/ML IV BOLUS
INTRAVENOUS | Status: DC | PRN
  Administered 2023-11-26: 120 mg via INTRAVENOUS

## 2023-11-26 MED ORDER — PHENYLEPHRINE HCL-NACL 20-0.9 MG/250ML-% IV SOLN
INTRAVENOUS | Status: DC | PRN
  Administered 2023-11-26: 30 ug/min via INTRAVENOUS

## 2023-11-26 MED ORDER — METOPROLOL TARTRATE 5 MG/5ML IV SOLN
INTRAVENOUS | Status: AC
  Filled 2023-11-26: qty 5

## 2023-11-26 MED ORDER — PHENYLEPHRINE 80 MCG/ML (10ML) SYRINGE FOR IV PUSH (FOR BLOOD PRESSURE SUPPORT)
PREFILLED_SYRINGE | INTRAVENOUS | Status: DC | PRN
  Administered 2023-11-26 (×5): 80 ug via INTRAVENOUS

## 2023-11-26 MED ORDER — OXYCODONE HCL 5 MG/5ML PO SOLN: 5.0000 mg | Freq: Once | ORAL | Status: DC | PRN

## 2023-11-26 MED ORDER — GENTAMICIN SULFATE 40 MG/ML IJ SOLN
INTRAMUSCULAR | Status: AC
  Filled 2023-11-26: qty 4

## 2023-11-26 MED ORDER — SUGAMMADEX SODIUM 200 MG/2ML IV SOLN
INTRAVENOUS | Status: DC | PRN
  Administered 2023-11-26: 200 mg via INTRAVENOUS

## 2023-11-26 MED ORDER — SUCCINYLCHOLINE CHLORIDE 200 MG/10ML IV SOSY
PREFILLED_SYRINGE | INTRAVENOUS | Status: AC
  Filled 2023-11-26: qty 10

## 2023-11-26 MED ORDER — FENTANYL CITRATE (PF) 100 MCG/2ML IJ SOLN
INTRAMUSCULAR | Status: AC
  Filled 2023-11-26: qty 2

## 2023-11-26 MED ORDER — MIDAZOLAM HCL (PF) 2 MG/2ML IJ SOLN
INTRAMUSCULAR | Status: DC | PRN
  Administered 2023-11-26: 2 mg via INTRAVENOUS

## 2023-11-26 MED ORDER — 0.9 % SODIUM CHLORIDE (POUR BTL) OPTIME
TOPICAL | Status: DC | PRN
  Administered 2023-11-26: 500 mL

## 2023-11-26 SURGICAL SUPPLY — 52 items
BAG DECANTER FOR FLEXI CONT (MISCELLANEOUS) ×1 IMPLANT
BAG ISOLATATION DRAPE 20X20 ST (DRAPES) IMPLANT
BLADE SURG 15 STRL LF DISP TIS (BLADE) ×1 IMPLANT
BLADE SURG SZ11 CARB STEEL (BLADE) ×1 IMPLANT
BRUSH SCRUB EZ 4% CHG (MISCELLANEOUS) ×1 IMPLANT
CHLORAPREP W/TINT 26 (MISCELLANEOUS) ×1 IMPLANT
CLAMP SUTURE YELLOW 5 PAIRS (MISCELLANEOUS) ×1 IMPLANT
CLEANSER WND VASHE 34 (WOUND CARE) ×1 IMPLANT
CLIP APPLIE 11 MED OPEN (CLIP) IMPLANT
CLIP APPLIE 9.375 SM OPEN (CLIP) IMPLANT
DRAPE INCISE IOBAN 66X45 STRL (DRAPES) ×1 IMPLANT
DRAPE SHEET LG 3/4 BI-LAMINATE (DRAPES) ×1 IMPLANT
DRESSING SURGICEL FIBRLLR 1X2 (HEMOSTASIS) ×1 IMPLANT
DRSG OPSITE POSTOP 4X6 (GAUZE/BANDAGES/DRESSINGS) IMPLANT
ELECT CAUTERY BLADE 6.4 (BLADE) IMPLANT
ELECTRODE REM PT RTRN 9FT ADLT (ELECTROSURGICAL) ×1 IMPLANT
GAUZE 4X4 16PLY ~~LOC~~+RFID DBL (SPONGE) IMPLANT
GLOVE BIO SURGEON STRL SZ7 (GLOVE) ×3 IMPLANT
GOWN STRL REUS W/ TWL LRG LVL3 (GOWN DISPOSABLE) ×2 IMPLANT
GOWN STRL REUS W/ TWL XL LVL3 (GOWN DISPOSABLE) ×1 IMPLANT
GOWN STRL REUS W/TWL 2XL LVL3 (GOWN DISPOSABLE) ×1 IMPLANT
HEMOSTAT HEMOBLAST BELLOWS (HEMOSTASIS) IMPLANT
IV 0.9% NACL 500 ML (IV SOLUTION) ×1 IMPLANT
KIT STIMULAN RAPID CURE 5CC (Orthopedic Implant) IMPLANT
KIT TURNOVER KIT A (KITS) ×1 IMPLANT
LABEL OR SOLS (LABEL) ×1 IMPLANT
LOOP VESSEL MAXI 1X406 RED (MISCELLANEOUS) ×2 IMPLANT
LOOP VESSEL MINI 0.8X406 BLUE (MISCELLANEOUS) ×2 IMPLANT
MANIFOLD NEPTUNE II (INSTRUMENTS) ×1 IMPLANT
NDL SAFETY ECLIP 18X1.5 (MISCELLANEOUS) ×1 IMPLANT
NS IRRIG 500ML POUR BTL (IV SOLUTION) ×1 IMPLANT
PACK BASIN MAJOR ARMC (MISCELLANEOUS) ×1 IMPLANT
PACK UNIVERSAL (MISCELLANEOUS) ×1 IMPLANT
PENCIL SMOKE EVACUATOR (MISCELLANEOUS) IMPLANT
RETRACTOR TRAXI PANNICULUS (MISCELLANEOUS) IMPLANT
SET WALTER ACTIVATION W/DRAPE (SET/KITS/TRAYS/PACK) ×1 IMPLANT
SPONGE T-LAP 18X18 ~~LOC~~+RFID (SPONGE) IMPLANT
STAPLER SKIN PROX 35W (STAPLE) ×1 IMPLANT
SUT PROLENE 5 0 RB 1 DA (SUTURE) ×2 IMPLANT
SUT PROLENE 6 0 BV (SUTURE) ×4 IMPLANT
SUT PROLENE 7 0 BV 1 (SUTURE) ×2 IMPLANT
SUT SILK 2-0 18XBRD TIE 12 (SUTURE) ×1 IMPLANT
SUT SILK 3-0 18XBRD TIE 12 (SUTURE) ×1 IMPLANT
SUT SILK 4-0 18XBRD TIE 12 (SUTURE) ×1 IMPLANT
SUT VIC AB 2-0 CT1 TAPERPNT 27 (SUTURE) ×2 IMPLANT
SUT VIC AB 3-0 SH 27X BRD (SUTURE) ×1 IMPLANT
SUT VICRYL+ 3-0 36IN CT-1 (SUTURE) ×2 IMPLANT
SUTURE EHLN 3-0 FS-10 30 BLK (SUTURE) ×1 IMPLANT
SYR 5ML LL (SYRINGE) ×1 IMPLANT
TRAP FLUID SMOKE EVACUATOR (MISCELLANEOUS) ×1 IMPLANT
TRAY FOLEY MTR SLVR 16FR STAT (SET/KITS/TRAYS/PACK) ×1 IMPLANT
WATER STERILE IRR 500ML POUR (IV SOLUTION) ×1 IMPLANT

## 2023-11-26 NOTE — Anesthesia Preprocedure Evaluation (Signed)
 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 There is no evidence of ischemia. There is no evidence of infarction.   Rest left ventricular function is normal. Rest EF: 61%. Stress left ventricular function is normal. Stress EF: 79%. End diastolic cavity size is normal.   Myocardial blood flow was computed to be 0.21ml/g/min at rest and 2.31ml/g/min at stress. Global myocardial blood flow reserve was 2.94 and was normal.   Coronary calcium  assessment not performed due to prior revascularization.   The study is normal. The study is low risk.   Limited echo 05/29/2022: 1. Left ventricular ejection fraction, by estimation, is 60 to 65%. The  left ventricle has normal function. The left ventricle has no regional  wall motion abnormalities. Left ventricular diastolic parameters were  normal. The average left ventricular  global longitudinal strain is -17.9 %. The global longitudinal strain is  normal.   2. Right ventricular systolic function is normal. The right ventricular  size is normal.   3. The mitral valve is normal in structure. No evidence of mitral valve  regurgitation.   4. The aortic valve is tricuspid. Aortic valve regurgitation is not  visualized.   5. The inferior vena cava is normal in  size with greater than 50%  respiratory variability, suggesting right atrial pressure of 3 mmHg.   Comparison(s): 01/23/22 60-65%.    LHC 01/23/2022: Conclusions: 1. Severe single-vessel coronary artery disease with 99% mid RCA stenosis, likely due to acute plaque rupture, with TIMI-2 flow.  There is also a hazy 50-60% proximal RCA stenosis as well as moderate to severe but not critical left coronary artery disease as detailed below. 2. Normal left ventricular filling pressure (LVEDP 12 mmHg). 3. Successful PCI to proximal and mid RCA stenoses using nonoverlapping Onyx Frontier 3.0 x 12 mm (proximal) and 2.75 x 18 mm (mid) drug-eluting stents with 0% residual stenosis and TIMI-3 flow.  Jailed RV marginal branch arising from the mid RCA demonstrates 70% stenosis pre-PCI, increased to 90% plaque shift.  There is TIMI-2 flow at the end of the procedure through this small branch, which is too small for intervention    Neuro/Psych  PSYCHIATRIC DISORDERS Anxiety Depression     Neuromuscular disease    GI/Hepatic Neg liver ROS, hiatal hernia, PUD,GERD  Controlled,,  Endo/Other  negative endocrine ROS    Renal/GU negative Renal ROS  negative genitourinary   Musculoskeletal   Abdominal Normal abdominal exam  (+)   Peds  Hematology  (+) Blood dyscrasia, anemia   Anesthesia Other Findings Past Medical History: No date: Anxiety No date: Depression No date: GERD (gastroesophageal reflux disease) 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HCC) No date: Tongue cancer [facility])  Past Surgical History: 03/30/2020: COLONOSCOPY WITH PROPOFOL ; N/A     Comment:  Procedure: COLONOSCOPY WITH PROPOFOL ;  Surgeon:  Janalyn Keene NOVAK, MD;  Location: [facility] ENDOSCOPY;                Service: Endoscopy;  Laterality: N/A;  COVID POSITIVE               02/29/2020 01/23/2022: CORONARY/GRAFT ACUTE MI REVASCULARIZATION; N/A     Comment:  Procedure: Coronary/Graft Acute MI Revascularization;                Surgeon:  Mady Bruckner, MD;  Location: [facility] INVASIVE               CV LAB;  Service: Cardiovascular;  Laterality: N/A; 03/30/2020: ESOPHAGOGASTRODUODENOSCOPY (EGD) WITH PROPOFOL ; N/A     Comment:  Procedure: ESOPHAGOGASTRODUODENOSCOPY (EGD) WITH               PROPOFOL ;  Surgeon: Janalyn Keene NOVAK, MD;  Location:               [facility] ENDOSCOPY;  Service: Endoscopy;  Laterality: N/A; 08/03/2020: ESOPHAGOGASTRODUODENOSCOPY (EGD) WITH PROPOFOL ; N/A     Comment:  Procedure: ESOPHAGOGASTRODUODENOSCOPY (EGD) WITH               PROPOFOL ;  Surgeon: Janalyn Keene NOVAK, MD;  Location:               [facility] ENDOSCOPY;  Service: Endoscopy;  Laterality: N/A; 01/25/2021: ESOPHAGOGASTRODUODENOSCOPY (EGD) WITH PROPOFOL ; N/A     Comment:  Procedure: ESOPHAGOGASTRODUODENOSCOPY (EGD) WITH               PROPOFOL ;  Surgeon: Janalyn Keene NOVAK, MD;  Location:               [facility] ENDOSCOPY;  Service: Endoscopy;  Laterality: N/A; 09/05/2021: ESOPHAGOGASTRODUODENOSCOPY (EGD) WITH PROPOFOL ; N/A     Comment:  Procedure: ESOPHAGOGASTRODUODENOSCOPY (EGD) WITH               PROPOFOL ;  Surgeon: Unk Corinn Skiff, MD;  Location:               [facility] ENDOSCOPY;  Service: Gastroenterology;  Laterality:               N/A; 01/18/2019: EXCISION MASS NECK; Left     Comment:  Procedure: EXCISION MASS NECK/NODE;  Surgeon: Juengel,               Paul, MD;  Location: [facility] ORS;  Service: ENT;                Laterality: Left; 12/05/2021: IR REMOVAL TUN ACCESS W/ PORT W/O FL MOD SED 02/22/2019: LARYNGOSCOPY; Bilateral     Comment:  Procedure: MICROSCOPIC DIRECT LARYNGOSCOPY AND BIOPSY;                Surgeon: Edda Mt, MD;  Location: [facility] ORS;                Service: ENT;  Laterality: Bilateral; 01/23/2022: LEFT HEART CATH AND CORONARY ANGIOGRAPHY; N/A     Comment:  Procedure: LEFT HEART CATH AND CORONARY ANGIOGRAPHY;                Surgeon: Mady Bruckner, MD;  Location: [facility] INVASIVE               CV LAB;  Service: Cardiovascular;   Laterality: N/A; 03/24/2019: PORTA CATH INSERTION; N/A     Comment:  Procedure: PORTA CATH INSERTION;  Surgeon: Jama Cordella MATSU, MD;  Location: [facility] INVASIVE  CV LAB;  Service:              Cardiovascular;  Laterality: N/A; No date: TONSILLECTOMY No date: TUBAL LIGATION     Reproductive/Obstetrics negative OB ROS                              Anesthesia Physical Anesthesia Plan  ASA: 3 and emergent  Anesthesia Plan: General ETT   Post-op Pain Management: Toradol  IV (intra-op)* and Ofirmev  IV (intra-op)*   Induction: Intravenous  PONV Risk Score and Plan: 2 and Ondansetron , Dexamethasone  and Treatment may vary due to age or medical condition  Airway Management Planned: Oral ETT  Additional Equipment:   Intra-op Plan:   Post-operative Plan: Extubation in OR  Informed Consent: I have reviewed the patients History and Physical, chart, labs and discussed the procedure including the risks, benefits and alternatives for the proposed anesthesia with the patient or authorized representative who has indicated his/her understanding and acceptance.     Dental Advisory Given  Plan Discussed with: Anesthesiologist, CRNA and Surgeon  Anesthesia Plan Comments: (Patient consented for risks of anesthesia including but not limited to:  - adverse reactions to medications - damage to eyes, teeth, lips or other oral mucosa - nerve damage due to positioning  - sore throat or hoarseness - Damage to heart, brain, nerves, lungs, other parts of body or loss of life  Patient voiced understanding and assent.)         Anesthesia Quick Evaluation

## 2023-11-26 NOTE — Anesthesia Procedure Notes (Signed)
 Arterial Line Insertion Start/End10/22/2025 8:50 AM, 11/26/2023 8:55 AM Performed by: Leavy Ned, MD, anesthesiologist  Patient location: Pre-op. Preanesthetic checklist: patient identified, IV checked, site marked, risks and benefits discussed, surgical consent, monitors and equipment checked, pre-op evaluation, timeout performed and anesthesia consent Lidocaine  1% used for infiltration radial was placed Catheter size: 20 G Hand hygiene performed  and maximum sterile barriers used   Attempts: 1 Procedure performed using ultrasound to evaluate access site. Ultrasound Notes:relevant anatomy identified, ultrasound used to visualize needle entry and vessel patent under ultrasound. Following insertion, dressing applied and Biopatch. Post procedure assessment: normal and unchanged  Patient tolerated the procedure well with no immediate complications.

## 2023-11-26 NOTE — H&P (View-Only) (Signed)
 [facility]  Daily Progress Note   Subjective  -   Continues to have right groin pain.  Blood pressure hovering around 100/60.  Hemoglobin this morning came back at 6.8 showing continued decrease over the past 36 hours.  Objective Vitals:   11/26/23 0500 11/26/23 0530 11/26/23 0600 11/26/23 0630  BP: (!) 106/58 98/70 111/71 (!) 108/57  Pulse: 77 74 87 71  Resp: 11 12 14  (!) 7  Temp:      TempSrc:      SpO2: 97% 95% 97% 97%  Weight:      Height:        Intake/Output Summary (Last 24 hours) at 11/26/2023 0743 Last data filed at 11/26/2023 9347 Gross per 24 hour  Intake 632.93 ml  Output 3050 ml  Net -2417.07 ml    PULM  CTAB CV  RRR VASC  right groin with significant bruising and fullness from hematoma.  Laboratory CBC    Component Value Date/Time   WBC 6.3 11/26/2023 0410   HGB 6.8 (L) 11/26/2023 0410   HGB 11.8 11/06/2023 1151   HCT 21.0 (L) 11/26/2023 0410   HCT 37.3 11/06/2023 1151   PLT 165 11/26/2023 0410   PLT 221 11/06/2023 1151    BMET    Component Value Date/Time   NA 140 11/26/2023 0410   NA 145 (H) 11/06/2023 1151   NA 140 06/24/2011 1709   K 3.3 (L) 11/26/2023 0410   K 4.3 06/24/2011 1709   CL 111 11/26/2023 0410   CL 105 06/24/2011 1709   CO2 25 11/26/2023 0410   CO2 30 06/24/2011 1709   GLUCOSE 107 (H) 11/26/2023 0410   GLUCOSE 101 (H) 06/24/2011 1709   BUN 9 11/26/2023 0410   BUN 14 11/06/2023 1151   BUN 17 06/24/2011 1709   CREATININE 0.72 11/26/2023 0410   CREATININE 1.11 (H) 05/30/2023 1258   CREATININE 0.91 06/24/2011 1709   CALCIUM  8.2 (L) 11/26/2023 0410   CALCIUM  9.4 06/24/2011 1709   GFRNONAA >60 11/26/2023 0410   GFRNONAA 56 (L) 05/30/2023 1258   GFRNONAA >60 06/24/2011 1709   GFRAA >60 07/28/2019 1048   GFRAA >60 06/24/2011 1709    Assessment/Planning: POD # 2 status post left carotid stent and subsequent right femoral intervention for bleeding  Hemoglobin continues to drift down and is now  below 7.  Patient having significant pain. At this point, I think our best course of action will be to take her to the operating room for evacuation of the hematoma and femoral artery repair. Will also plan transfusion of packed red blood cells with hemoglobin below 7 and soft blood pressure as well as expected blood loss from surgery. Have discussed with the patient in detail this morning and she is agreeable to proceed    Selinda Gu  11/26/2023, 7:43 AM

## 2023-11-26 NOTE — Interval H&P Note (Signed)
 History and Physical Interval Note:  11/26/2023 7:48 AM  Connie Cameron  has presented today for surgery, with the diagnosis of post procedural bleeding.  The various methods of treatment have been discussed with the patient and family. After consideration of risks, benefits and other options for treatment, the patient has consented to  Procedure(s): EXPLORATION, ARTERY, FEMORAL (Right) as a surgical intervention.  The patient's history has been reviewed, patient examined, no change in status, stable for surgery.  I have reviewed the patient's chart and labs.  Questions were answered to the patient's satisfaction.     Arty Lantzy

## 2023-11-26 NOTE — Op Note (Signed)
 OPERATIVE NOTE   PROCEDURE: 1.   Right femoral artery exploration, evacuation of hematoma, ligation of bleeding profunda femoris artery branch    PRE-OPERATIVE DIAGNOSIS: 1.right groin hematoma with continued bleeding from right femoral artery access site despite previous attempts at endovascular hemostasis  POST-OPERATIVE DIAGNOSIS: Same  SURGEON: Selinda Gu, MD  ANESTHESIA:  general  ESTIMATED BLOOD LOSS: 25 cc  FINDING(S): 1.  Anterior lateral profunda femoris artery branch near the origin of the profunda femoris artery with continued bleeding.  Bleeding controlled with ligation of branch  SPECIMEN(S):  none  INDICATIONS:    Patient presents with right groin hematoma with continued bleeding after left carotid stent placement.  Attempted endovascular hemostasis was performed but she has had continued drop in her hemoglobin and continued pain.  She is brought to the operating room for surgical exploration.  The risks and benefits as well as alternative therapies including intervention were reviewed in detail all questions were answered the patient agrees to proceed with surgery.  DESCRIPTION: After obtaining full informed written consent, the patient was brought back to the operating room and placed supine upon the operating table.  The patient received IV antibiotics prior to induction.  After obtaining adequate anesthesia, the patient was prepped and draped in the standard fashion appropriate time out is called.    Vertical incision was created overlying the right femoral arteries. The common femoral artery proximally, and superficial femoral artery, and primary profunda femoris artery branches identified.  There was a significant hematoma that was evacuated.  On exposure, there was a small anterior lateral profunda femoris artery branch near the origin of the profunda femoris artery anteriorly that had active bleeding.  This was consistent with the angiogram findings.  The StarClose  closure device was found to be on the arterial wall and hemostatic with no bleeding.  No other areas of concern were identified. At this point, the branch was doubly ligated proximally with a 2-0 silk tie and a 5-0 Prolene suture ligature.  The distal side was oversewn with a 5-0 Prolene suture ligature.  At this point, the common femoral artery, profunda femoris artery, and superficial femoral artery were interrogated as were side branches and there was no other sign of ongoing bleeding.  The wound was then irrigated with Vashe irrigation.  Gentamicin and vancomycin  impregnated antibiotic beads were made and placed in the wound.  Fibrillar and hemoblast topical hemostatic agents were placed in the femoral incision and hemostasis was complete. The femoral incision was then closed in a layered fashion with 2 layers of 2-0 Vicryl, 2 layers of 3-0 Vicryl, and staples for the skin closure. Sterile dressing were then placed over the incision.  The patient was then awakened from anesthesia and taken to the recovery room in stable condition having tolerated the procedure well.  COMPLICATIONS: None  CONDITION: Stable     Selinda Gu 11/26/2023 10:14 AM   This note was created with Dragon Medical transcription system. Any errors in dictation are purely unintentional.

## 2023-11-26 NOTE — Progress Notes (Signed)
 [facility]  Daily Progress Note   Subjective  -   Continues to have right groin pain.  Blood pressure hovering around 100/60.  Hemoglobin this morning came back at 6.8 showing continued decrease over the past 36 hours.  Objective Vitals:   11/26/23 0500 11/26/23 0530 11/26/23 0600 11/26/23 0630  BP: (!) 106/58 98/70 111/71 (!) 108/57  Pulse: 77 74 87 71  Resp: 11 12 14  (!) 7  Temp:      TempSrc:      SpO2: 97% 95% 97% 97%  Weight:      Height:        Intake/Output Summary (Last 24 hours) at 11/26/2023 0743 Last data filed at 11/26/2023 9347 Gross per 24 hour  Intake 632.93 ml  Output 3050 ml  Net -2417.07 ml    PULM  CTAB CV  RRR VASC  right groin with significant bruising and fullness from hematoma.  Laboratory CBC    Component Value Date/Time   WBC 6.3 11/26/2023 0410   HGB 6.8 (L) 11/26/2023 0410   HGB 11.8 11/06/2023 1151   HCT 21.0 (L) 11/26/2023 0410   HCT 37.3 11/06/2023 1151   PLT 165 11/26/2023 0410   PLT 221 11/06/2023 1151    BMET    Component Value Date/Time   NA 140 11/26/2023 0410   NA 145 (H) 11/06/2023 1151   NA 140 06/24/2011 1709   K 3.3 (L) 11/26/2023 0410   K 4.3 06/24/2011 1709   CL 111 11/26/2023 0410   CL 105 06/24/2011 1709   CO2 25 11/26/2023 0410   CO2 30 06/24/2011 1709   GLUCOSE 107 (H) 11/26/2023 0410   GLUCOSE 101 (H) 06/24/2011 1709   BUN 9 11/26/2023 0410   BUN 14 11/06/2023 1151   BUN 17 06/24/2011 1709   CREATININE 0.72 11/26/2023 0410   CREATININE 1.11 (H) 05/30/2023 1258   CREATININE 0.91 06/24/2011 1709   CALCIUM  8.2 (L) 11/26/2023 0410   CALCIUM  9.4 06/24/2011 1709   GFRNONAA >60 11/26/2023 0410   GFRNONAA 56 (L) 05/30/2023 1258   GFRNONAA >60 06/24/2011 1709   GFRAA >60 07/28/2019 1048   GFRAA >60 06/24/2011 1709    Assessment/Planning: POD # 2 status post left carotid stent and subsequent right femoral intervention for bleeding  Hemoglobin continues to drift down and is now  below 7.  Patient having significant pain. At this point, I think our best course of action will be to take her to the operating room for evacuation of the hematoma and femoral artery repair. Will also plan transfusion of packed red blood cells with hemoglobin below 7 and soft blood pressure as well as expected blood loss from surgery. Have discussed with the patient in detail this morning and she is agreeable to proceed    Selinda Gu  11/26/2023, 7:43 AM

## 2023-11-26 NOTE — Transfer of Care (Signed)
 Immediate Anesthesia Transfer of Care Note  Patient: Connie Cameron  Procedure(s) Performed: EXPLORATION, ARTERY, FEMORAL (Right)  Patient Location: PACU  Anesthesia Type:General  Level of Consciousness: drowsy and patient cooperative  Airway & Oxygen Therapy: Patient Spontanous Breathing and Patient connected to face mask oxygen  Post-op Assessment: Report given to RN and Post -op Vital signs reviewed and stable  Post vital signs: Reviewed and stable  Last Vitals:  Vitals Value Taken Time  BP 134/77 11/26/23 10:31  Temp    Pulse 78 11/26/23 10:32  Resp 14 11/26/23 10:32  SpO2 100 % 11/26/23 10:32  Vitals shown include unfiled device data.  Last Pain:  Vitals:   11/26/23 0804  TempSrc: Temporal  PainSc: 6       Patients Stated Pain Goal: 0 (11/24/23 1900)  Complications: No notable events documented.

## 2023-11-26 NOTE — Anesthesia Procedure Notes (Signed)
 Procedure Name: Intubation Date/Time: 11/26/2023 8:53 AM  Performed by: Lorriane Arabia, CRNAPre-anesthesia Checklist: Patient identified, Patient being monitored, Timeout performed, Emergency Drugs available and Suction available Patient Re-evaluated:Patient Re-evaluated prior to induction Oxygen Delivery Method: Circle system utilized Preoxygenation: Pre-oxygenation with 100% oxygen Induction Type: IV induction Ventilation: Mask ventilation without difficulty Laryngoscope Size: 3 and McGrath Grade View: Grade II Tube type: Oral Tube size: 6.5 mm Number of attempts: 1 Airway Equipment and Method: Stylet Placement Confirmation: ETT inserted through vocal cords under direct vision, positive ETCO2 and breath sounds checked- equal and bilateral Secured at: 22 cm Tube secured with: Tape Dental Injury: Teeth and Oropharynx as per pre-operative assessment

## 2023-11-27 ENCOUNTER — Encounter: Payer: Self-pay | Admitting: Vascular Surgery

## 2023-11-27 LAB — CBC
HCT: 25.1 % — ABNORMAL LOW (ref 36.0–46.0)
HCT: 26.4 % — ABNORMAL LOW (ref 36.0–46.0)
Hemoglobin: 8.4 g/dL — ABNORMAL LOW (ref 12.0–15.0)
Hemoglobin: 8.7 g/dL — ABNORMAL LOW (ref 12.0–15.0)
MCH: 29.4 pg (ref 26.0–34.0)
MCH: 29.3 pg (ref 26.0–34.0)
MCHC: 33.5 g/dL (ref 30.0–36.0)
MCHC: 33 g/dL (ref 30.0–36.0)
MCV: 87.8 fL (ref 80.0–100.0)
MCV: 88.9 fL (ref 80.0–100.0)
Platelets: 191 K/uL (ref 150–400)
Platelets: 192 K/uL (ref 150–400)
RBC: 2.86 MIL/uL — ABNORMAL LOW (ref 3.87–5.11)
RBC: 2.97 MIL/uL — ABNORMAL LOW (ref 3.87–5.11)
RDW: 15.5 % (ref 11.5–15.5)
RDW: 15.5 % (ref 11.5–15.5)
WBC: 12.3 K/uL — ABNORMAL HIGH (ref 4.0–10.5)
WBC: 12.8 K/uL — ABNORMAL HIGH (ref 4.0–10.5)
nRBC: 0 % (ref 0.0–0.2)
nRBC: 0 % (ref 0.0–0.2)

## 2023-11-27 LAB — BASIC METABOLIC PANEL WITH GFR
Anion gap: 8 (ref 5–15)
BUN: 13 mg/dL (ref 8–23)
CO2: 22 mmol/L (ref 22–32)
Calcium: 9 mg/dL (ref 8.9–10.3)
Chloride: 108 mmol/L (ref 98–111)
Creatinine, Ser: 0.93 mg/dL (ref 0.44–1.00)
GFR, Estimated: >60 mL/min (ref >60)
Glucose, Bld: 139 mg/dL — ABNORMAL HIGH (ref 70–99)
Potassium: 4.3 mmol/L (ref 3.5–5.1)
Sodium: 138 mmol/L (ref 135–145)

## 2023-11-27 NOTE — Progress Notes (Signed)
 Patient transferred. All patient belongings kept at bedside transferred to new room with patient. Pt AxOx4. VSS. Patient states she will call her husband and update him on transfer.

## 2023-11-27 NOTE — Progress Notes (Signed)
 [facility]  Daily Progress Note   Subjective  -   Appropriately sore. No major events overnight. Hgb 8.4 this am.    Objective Vitals:   11/27/23 0900 11/27/23 1000 11/27/23 1100 11/27/23 1200  BP:  (!) 99/56 (!) 155/85   Pulse: 75 64 71   Resp: (!) 25 18 (!) 21   Temp:    98.1 F (36.7 C)  TempSrc:    Oral  SpO2: 96% 95% 97%   Weight:      Height:        Intake/Output Summary (Last 24 hours) at 11/27/2023 1311 Last data filed at 11/27/2023 0400 Gross per 24 hour  Intake --  Output 2050 ml  Net -2050 ml    PULM  CTAB CV  RRR VASC  Feet warm. Groin bruised but without enlarging hematoma.  Laboratory CBC    Component Value Date/Time   WBC 12.3 (H) 11/27/2023 0625   HGB 8.4 (L) 11/27/2023 0625   HGB 11.8 11/06/2023 1151   HCT 25.1 (L) 11/27/2023 0625   HCT 37.3 11/06/2023 1151   PLT 191 11/27/2023 0625   PLT 221 11/06/2023 1151    BMET    Component Value Date/Time   NA 138 11/27/2023 0625   NA 145 (H) 11/06/2023 1151   NA 140 06/24/2011 1709   K 4.3 11/27/2023 0625   K 4.3 06/24/2011 1709   CL 108 11/27/2023 0625   CL 105 06/24/2011 1709   CO2 22 11/27/2023 0625   CO2 30 06/24/2011 1709   GLUCOSE 139 (H) 11/27/2023 0625   GLUCOSE 101 (H) 06/24/2011 1709   BUN 13 11/27/2023 0625   BUN 14 11/06/2023 1151   BUN 17 06/24/2011 1709   CREATININE 0.93 11/27/2023 0625   CREATININE 1.11 (H) 05/30/2023 1258   CREATININE 0.91 06/24/2011 1709   CALCIUM  9.0 11/27/2023 0625   CALCIUM  9.4 06/24/2011 1709   GFRNONAA >60 11/27/2023 0625   GFRNONAA 56 (L) 05/30/2023 1258   GFRNONAA >60 06/24/2011 1709   GFRAA >60 07/28/2019 1048   GFRAA >60 06/24/2011 1709    Assessment/Planning: POD # 1 s/p right femoral exploration for bleeding after carotid stent placement  Doing well Ok to go to the floor.  Hgb 8.4.  was 9.1 yesterday evening but that was higher than expected after transfusion for 6.8.  recheck later and in the morning Ok to  mobilize Possible discharge home tomorrow or Saturday depending on her mobility and pain levels   Selinda Gu  11/27/2023, 1:11 PM

## 2023-11-27 NOTE — Plan of Care (Signed)
  Problem: Education: Goal: Knowledge of General Education information will improve Description: Including pain rating scale, medication(s)/side effects and non-pharmacologic comfort measures Outcome: Progressing   Problem: Health Behavior/Discharge Planning: Goal: Ability to manage health-related needs will improve Outcome: Progressing   Problem: Clinical Measurements: Goal: Ability to maintain clinical measurements within normal limits will improve Outcome: Progressing Goal: Diagnostic test results will improve Outcome: Progressing   Problem: Activity: Goal: Risk for activity intolerance will decrease Outcome: Progressing   Problem: Elimination: Goal: Will not experience complications related to urinary retention Outcome: Progressing   Problem: Pain Managment: Goal: General experience of comfort will improve and/or be controlled Outcome: Progressing

## 2023-11-27 NOTE — Care Management Important Message (Signed)
 Important Message  Patient Details  Name: Connie Cameron MRN: [id number] Date of Birth: Jan 19, 1961   Important Message Given:  Yes - Medicare IM     Rojelio SHAUNNA Rattler 11/27/2023, 1:45 PM

## 2023-11-27 NOTE — Anesthesia Postprocedure Evaluation (Signed)
 Anesthesia Post Note  Patient: Connie Cameron  Procedure(s) Performed: EXPLORATION, ARTERY, FEMORAL (Right)  Patient location during evaluation: PACU Anesthesia Type: General Level of consciousness: awake and alert Pain management: pain level controlled Vital Signs Assessment: post-procedure vital signs reviewed and stable Respiratory status: spontaneous breathing, nonlabored ventilation, respiratory function stable and patient connected to nasal cannula oxygen Cardiovascular status: blood pressure returned to baseline and stable Postop Assessment: no apparent nausea or vomiting Anesthetic complications: no   No notable events documented.   Last Vitals:  Vitals:   11/27/23 0750 11/27/23 0800  BP:  (!) 91/55  Pulse:  (!) 55  Resp:  (!) 9  Temp: (!) 36.3 C   SpO2:  94%    Last Pain:  Vitals:   11/27/23 0750  TempSrc: Axillary  PainSc: 5                  Debby Mines

## 2023-11-28 ENCOUNTER — Ambulatory Visit: Admitting: Internal Medicine

## 2023-11-28 ENCOUNTER — Ambulatory Visit

## 2023-11-28 ENCOUNTER — Other Ambulatory Visit

## 2023-11-28 ENCOUNTER — Ambulatory Visit: Admitting: Nurse Practitioner

## 2023-11-28 NOTE — Evaluation (Signed)
 Physical Therapy Evaluation Patient Details Name: Connie Cameron MRN: [id number] DOB: [birth year] Today's Date: 11/28/2023  History of Present Illness  admitted for acute hospitalization s/p L CEA via R femoral artery (11/24/23), complicated by post-op hematoma to R groin (suspected psuedoaneurysm).  Hospital course additionally significant for R femoral artery pseudoaneurysm repair via L femoral artery (11/24/23) and subsequent R femoral artery exploration, evacuation of hematoma and ligation of profunda femoris artery (11/26/23)  Clinical Impression  Patient resting in bed upon arrival to room; alert and oriented, follows commands and agreeable to participation with treatment session.  Endorses significant pain in R groin (up to 12/10 with mobility); meds requested per RN as available. R groin generally edematous with significant ecchymosis throughout hip/groin; bilat dressings clean/dry (mild old, dried drainage to R honeycomb) both before and after treatment sessions.  Guarded with all movement, but demonstrating strength at least 3-/5 at hip, 4/5 knee and ankle. Currently able to complete bed mobility with close sup; sit/stand, basic transfers and bed/chair transfer with RW, min assist. Requires heavy use of UEs to assist; slow and guarded movement transition, but eager to progress as pain allows.  Limited tolerance for end-range flexion or extension of R hip with functional mobility due to pain.  Additional gait distance limited as result.  Do anticipate consistent progression towards all goals as pain controlled (encouraged patient request/allow pain meds as available to allow for optimal participation). Would benefit from skilled PT to address above deficits and promote optimal return to PLOF; recommend post-acute PT follow up as indicated by interdisciplinary care team.     Of note, patient does endorse recent R ankle fracture (approx 3 weeks prior to admission?); WBAT with CAMboot/airboot  donned per patient report.      If plan is discharge home, recommend the following: A little help with walking and/or transfers;A little help with bathing/dressing/bathroom   Can travel by private vehicle        Equipment Recommendations Rolling walker (2 wheels)  Recommendations for Other Services       Functional Status Assessment Patient has had a recent decline in their functional status and demonstrates the ability to make significant improvements in function in a reasonable and predictable amount of time.     Precautions / Restrictions Precautions Precautions: Fall Restrictions Weight Bearing Restrictions Per Provider Order: Yes RLE Weight Bearing Per Provider Order: Weight bearing as tolerated Other Position/Activity Restrictions: Per patient, recent R ankle fracture; WBAT with boot in place (present in room)      Mobility  Bed Mobility Overal bed mobility: Needs Assistance Bed Mobility: Supine to Sit                Transfers Overall transfer level: Needs assistance Equipment used: Rolling walker (2 wheels) Transfers: Sit to/from Stand, Bed to chair/wheelchair/BSC Sit to Stand: Min assist Stand pivot transfers: Min assist         General transfer comment: heavy use of UEs to assist; slow and guarded movement transition, but eager to progress as pain allows    Ambulation/Gait               General Gait Details: deferred due to pain  Stairs            Wheelchair Mobility     Tilt Bed    Modified Rankin (Stroke Patients Only)       Balance Overall balance assessment: Needs assistance Sitting-balance support: No upper extremity supported, Feet supported Sitting balance-Leahy Scale: Good  Standing balance support: Bilateral upper extremity supported Standing balance-Leahy Scale: Fair                               Pertinent Vitals/Pain Pain Assessment Pain Assessment: 0-10 Pain Score: 10-Worst pain ever (with  movement) Pain Location: R groin Pain Descriptors / Indicators: Aching, Guarding, Grimacing Pain Intervention(s): Limited activity within patient's tolerance, Monitored during session, Repositioned    Home Living Family/patient expects to be discharged to:: Private residence Living Arrangements:  (Roomate, Information systems manager) Available Help at Discharge: Family;Friend(s) Type of Home: Mobile home Home Access: Stairs to enter Entrance Stairs-Rails: Right;Left;Can reach both Entrance Stairs-Number of Steps: 5   Home Layout: One level Home Equipment: None      Prior Function Prior Level of Function : Independent/Modified Independent             Mobility Comments: Indep with ADLs, household and community mobilization without assist device; denies fall history.       Extremity/Trunk Assessment   Upper Extremity Assessment Upper Extremity Assessment: Overall WFL for tasks assessed    Lower Extremity Assessment Lower Extremity Assessment: Generalized weakness (R hip grossly 3-/5, generally guarded due to pain; notable ecchymosis throughout R groin/hip area)       Communication   Communication Communication: No apparent difficulties    Cognition Arousal: Alert Behavior During Therapy: WFL for tasks assessed/performed   PT - Cognitive impairments: No apparent impairments                         Following commands: Intact       Cueing Cueing Techniques: Verbal cues     General Comments      Exercises     Assessment/Plan    PT Assessment Patient needs continued PT services  PT Problem List Decreased strength;Decreased range of motion;Decreased activity tolerance;Decreased balance;Decreased mobility;Decreased knowledge of use of DME;Decreased safety awareness;Decreased knowledge of precautions;Pain       PT Treatment Interventions DME instruction;Gait training;Stair training;Functional mobility training;Therapeutic activities;Therapeutic exercise;Balance  training;Patient/family education    PT Goals (Current goals can be found in the Care Plan section)  Acute Rehab PT Goals Patient Stated Goal: I want to go home! PT Goal Formulation: With patient Time For Goal Achievement: 12/12/23 Potential to Achieve Goals: Good    Frequency Min 2X/week     Co-evaluation               AM-PAC PT 6 Clicks Mobility  Outcome Measure Help needed turning from your back to your side while in a flat bed without using bedrails?: None Help needed moving from lying on your back to sitting on the side of a flat bed without using bedrails?: None Help needed moving to and from a bed to a chair (including a wheelchair)?: A Little Help needed standing up from a chair using your arms (e.g., wheelchair or bedside chair)?: A Little Help needed to walk in hospital room?: A Little Help needed climbing 3-5 steps with a railing? : A Lot 6 Click Score: 19    End of Session   Activity Tolerance: Patient limited by pain Patient left: in chair;with call bell/phone within reach (alarm pad under patient; box not available.  Patient aware of need for assist with transfers.  RN informed/aware) Nurse Communication: Mobility status;Patient requests pain meds PT Visit Diagnosis: Repeated falls (R29.6);Difficulty in walking, not elsewhere classified (R26.2);Pain Pain - Right/Left: Right Pain -  part of body: Hip    Time: 1349-1410 PT Time Calculation (min) (ACUTE ONLY): 21 min   Charges:   PT Evaluation $PT Eval Moderate Complexity: 1 Mod   PT General Charges $$ ACUTE PT VISIT: 1 Visit        Petula Rotolo H. Delores, PT, DPT, NCS 11/28/23, 2:28 PM [phone]

## 2023-11-28 NOTE — Progress Notes (Signed)
 [facility]  Daily Progress Note   Subjective  - 2 Days Post-Op  Patient in bed upon my entering.  She is alert watching TV.  She notes that her right groin is still pretty painful.  She was only able to stand and get to the chair at the bedside with physical therapy.  Objective Vitals:   11/27/23 2305 11/28/23 0413 11/28/23 0744 11/28/23 1546  BP: 93/68 94/63 108/67 109/66  Pulse: 61 65 63 88  Resp: 18 16 16 16   Temp: 98.5 F (36.9 C) 97.7 F (36.5 C) 97.9 F (36.6 C) 98.5 F (36.9 C)  TempSrc:  Oral    SpO2: 94% 98% 98% 98%  Weight:      Height:        Intake/Output Summary (Last 24 hours) at 11/28/2023 1655 Last data filed at 11/28/2023 1050 Gross per 24 hour  Intake 350 ml  Output 1125 ml  Net -775 ml    PULM  Normal effort , no use of accessory muscles CV  No JVD, RRR Abd      No distended, nontender VASC  right groin dressing clean dry and intact relatively minimal amount of ecchymoses noted. NEURO intact alert and appropriate  Laboratory CBC    Component Value Date/Time   WBC 12.8 (H) 11/27/2023 1438   HGB 8.7 (L) 11/27/2023 1438   HGB 11.8 11/06/2023 1151   HCT 26.4 (L) 11/27/2023 1438   HCT 37.3 11/06/2023 1151   PLT 192 11/27/2023 1438   PLT 221 11/06/2023 1151    BMET    Component Value Date/Time   NA 138 11/27/2023 0625   NA 145 (H) 11/06/2023 1151   NA 140 06/24/2011 1709   K 4.3 11/27/2023 0625   K 4.3 06/24/2011 1709   CL 108 11/27/2023 0625   CL 105 06/24/2011 1709   CO2 22 11/27/2023 0625   CO2 30 06/24/2011 1709   GLUCOSE 139 (H) 11/27/2023 0625   GLUCOSE 101 (H) 06/24/2011 1709   BUN 13 11/27/2023 0625   BUN 14 11/06/2023 1151   BUN 17 06/24/2011 1709   CREATININE 0.93 11/27/2023 0625   CREATININE 1.11 (H) 05/30/2023 1258   CREATININE 0.91 06/24/2011 1709   CALCIUM  9.0 11/27/2023 0625   CALCIUM  9.4 06/24/2011 1709   GFRNONAA >60 11/27/2023 0625   GFRNONAA 56 (L) 05/30/2023 1258   GFRNONAA >60  06/24/2011 1709   GFRAA >60 07/28/2019 1048   GFRAA >60 06/24/2011 1709    Assessment/Planning: POD # 2 s/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  We will continue physical therapy and encourage ambulation.  I suspect it will be a few days before she is ready for discharge.   Cordella Shawl  11/28/2023, 4:55 PM

## 2023-11-29 ENCOUNTER — Other Ambulatory Visit: Payer: Self-pay

## 2023-11-29 NOTE — Progress Notes (Signed)
 Physical Therapy Treatment Patient Details Name: Connie Cameron MRN: [id number] DOB: 1960-07-29 Today's Date: 11/29/2023   History of Present Illness admitted for acute hospitalization s/p L CEA via R femoral artery (11/24/23), complicated by post-op hematoma to R groin (suspected psuedoaneurysm).  Hospital course additionally significant for R femoral artery pseudoaneurysm repair via L femoral artery (11/24/23) and subsequent R femoral artery exploration, evacuation of hematoma and ligation of profunda femoris artery (11/26/23)    PT Comments  Pt tolerates treatment fair today due to increased pain levels in WB/with mobility, with pain increasing from 6/10 at rest (premedicated) and 11/10 after mobility. Treatment focused on functional strength training and safety with mobility. Today, she is able to improve overall assist levels, pain levels, ambulation distance, and activity tolerance from previous session. She is supervision for bed mobility, CGA for transfers, and supervision to ambulate 60ft x1 and 65ft x1 with seated rest break secondary to pain.   Demonstrates good safety awareness with mobility and within session carryover with cues for safe mobility in RW. Despite progress, she continues to be limited with meeting goals secondary to increased pain levels, decreased activity tolerance, and decreased standing balance. Pt will continue to benefit from skilled acute PT services to address deficits for return to baseline function.  Encourage OOB mobility with nursing and mobility tech for meals and toileting for continued progress towards goals and maintenance of IND with functional mobility while hospitalized.    If plan is discharge home, recommend the following: A little help with walking and/or transfers;A little help with bathing/dressing/bathroom   Can travel by private vehicle        Equipment Recommendations  Rolling walker (2 wheels)    Recommendations for Other Services        Precautions / Restrictions Precautions Precautions: Fall Restrictions Weight Bearing Restrictions Per Provider Order: Yes RLE Weight Bearing Per Provider Order: Weight bearing as tolerated Other Position/Activity Restrictions: Per patient, recent R ankle fracture; WBAT with boot in place (present in room)     Mobility  Bed Mobility   Bed Mobility: Supine to Sit     Supine to sit: Supervision     General bed mobility comments: supervision for safety to sit EOB, HOB flat, increased reliance of UE support on bed railing, increased time/effort; verbal cues for safety, sequencing, and hand placement    Transfers Overall transfer level: Needs assistance Equipment used: Rolling walker (2 wheels) Transfers: Sit to/from Stand Sit to Stand: Contact guard assist           General transfer comment: CGA for safety to stand from EOB and recliner with RW, increased time/effort to achieve full upright standing. CGA for safety to sit in recliner x2 with RW. multimodal cues for safety, sequencing, RW proximity, and hand placement.    Ambulation/Gait Ambulation/Gait assistance: Supervision Gait Distance (Feet): 11 Feet (39ft x1 (seated rest break); 67ft x1) Assistive device: Rolling walker (2 wheels)         General Gait Details: Supervision for safety to ambulate mutliple short bouts with RW and CAM boot donned to RLE. Demo's slowed cadence, decreased step length/foot clearance bilaterally, and antalgic gait with R stance secondary to c/o R groin pain. Chair follow for safety. Multimodal cues for safety, sequencing, and RW proximity.      Balance Overall balance assessment: Needs assistance Sitting-balance support: No upper extremity supported, Feet supported Sitting balance-Leahy Scale: Good     Standing balance support: Bilateral upper extremity supported Standing balance-Leahy Scale: Fair  Communication Communication Communication: No  apparent difficulties  Cognition Arousal: Alert Behavior During Therapy: WFL for tasks assessed/performed   PT - Cognitive impairments: No apparent impairments                         Following commands: Intact      Cueing Cueing Techniques: Verbal cues  Exercises Other Exercises Other Exercises: Pt edu re: PT role/POC, DC recommendations, transfer/gait in RW, pain management techniques (pursed lip breathing, no valsalva, proper positioning for decreased tension on R thigh), OOB to recliner, OOB for toileting with nursing, ambulation with nursing, stair navigation prior to DC home. She verbalized understanding.    General Comments General comments (skin integrity, edema, etc.): continues to have bruising to R groin and thigh, mild strikethrough noted to honeycomb; HR elevated to 105bpm with SpO2 at 97% on RA. Endorses nausea post session likely secondary to increased pain levels, RN notified.      Pertinent Vitals/Pain Pain Assessment Pain Assessment: 0-10 Pain Score: 6  (6/10 at rest progressing to 11/10 after mobility) Pain Location: R groin Pain Descriptors / Indicators: Aching, Guarding, Grimacing, Burning Pain Intervention(s): Limited activity within patient's tolerance, Monitored during session, Premedicated before session, Repositioned, Patient requesting pain meds-RN notified, Relaxation     PT Goals (current goals can now be found in the care plan section) Acute Rehab PT Goals Patient Stated Goal: I want to go home! PT Goal Formulation: With patient Time For Goal Achievement: 12/12/23 Potential to Achieve Goals: Good Progress towards PT goals: Progressing toward goals    Frequency    Min 2X/week       AM-PAC PT 6 Clicks Mobility   Outcome Measure  Help needed turning from your back to your side while in a flat bed without using bedrails?: None Help needed moving from lying on your back to sitting on the side of a flat bed without using  bedrails?: None Help needed moving to and from a bed to a chair (including a wheelchair)?: A Little Help needed standing up from a chair using your arms (e.g., wheelchair or bedside chair)?: A Little Help needed to walk in hospital room?: A Little Help needed climbing 3-5 steps with a railing? : A Lot 6 Click Score: 19    End of Session Equipment Utilized During Treatment: Gait belt Activity Tolerance: Patient limited by pain Patient left: in chair;with call bell/phone within reach (alarm pad under patient; box not available.  Patient aware of need for assist with transfers.  RN informed/aware) Nurse Communication: Mobility status;Patient requests pain meds PT Visit Diagnosis: Repeated falls (R29.6);Difficulty in walking, not elsewhere classified (R26.2);Pain Pain - Right/Left: Right Pain - part of body: Hip     Time: 9066-9050 PT Time Calculation (min) (ACUTE ONLY): 16 min  Charges:    $Therapeutic Activity: 8-22 mins PT General Charges $$ ACUTE PT VISIT: 1 Visit                      Camie CHARLENA Kluver, PT, DPT 10:20 AM,11/29/23 Physical Therapist - [facility]

## 2023-11-29 NOTE — Evaluation (Signed)
 Occupational Therapy Evaluation Patient Details Name: Connie Cameron MRN: [id number] DOB: 09-Jul-1960 Today's Date: 11/29/2023   History of Present Illness   admitted for acute hospitalization s/p L CEA via R femoral artery (11/24/23), complicated by post-op hematoma to R groin (suspected psuedoaneurysm).  Hospital course additionally significant for R femoral artery pseudoaneurysm repair via L femoral artery (11/24/23) and subsequent R femoral artery exploration, evacuation of hematoma and ligation of profunda femoris artery (11/26/23)     Clinical Impressions Upon entering patient up in chair with roommate of 21 years.  Patient has her boot on the right foot.  Patient report pain in right groin 8/10.  Patient willing and motivated to work with occupational therapy.  Patient independent with supervision for sit to stand as well as using rolling walker for transfer 5 steps to the bedside commode.  Patient independent with supervision and toilet hygiene and clothing management.  Bilateral upper extremity strength within normal limits and able to perform lower body bathing and dressing independent in including grooming a chair level.  Patient reports she will be using mostly house coats the first few weeks and left Grippi sock or stocking shoe.  Discussed with patient and roommate about home safety and decreasing falls by picking up any loose carpets, nightlight, pets to be careful with.  And bathroom modifications with DME may be a tub shower bench or shower chair with a hand-held shower.  Patient has a vanity next to the toilet to push-up from.  Patient mostly limited by pain with ADLs and mobility but independent with supervision.  No further needs for OT services.     If plan is discharge home, recommend the following:         Functional Status Assessment         Equipment Recommendations         Recommendations for Other Services         Precautions/Restrictions    Precautions Precautions: Fall Restrictions Weight Bearing Restrictions Per Provider Order: Yes RLE Weight Bearing Per Provider Order: Weight bearing as tolerated Other Position/Activity Restrictions: Per patient, recent R ankle fracture; WBAT with boot in place (present in room)     Mobility Bed Mobility                    Transfers Overall transfer level: Needs assistance Equipment used: Rolling walker (2 wheels) Transfers: Sit to/from Stand, Bed to chair/wheelchair/BSC Sit to Stand: Supervision Stand pivot transfers: Supervision         General transfer comment: Patient supervision from sit to stand from chair using rolling walker with 5 steps to the bedside commode using rolling walker and verbal cueing for safety but independent with supervision      Balance   Sitting-balance support: No upper extremity supported, Feet supported Sitting balance-Leahy Scale: Normal     Standing balance support: No upper extremity supported Standing balance-Leahy Scale: Good Standing balance comment: Patient able to negotiate clothing after toileting as well as hygiene with no loss of balance using bilateral hands.                           ADL either performed or assessed with clinical judgement   ADL                                         General  ADL Comments: Patient independent in bathing and dressing and grooming and sitting.  Toilet transfers to the commode using a rolling walker supervision with verbal cueing no loss of balance.  Including clothing management and toilet hygiene.  Patient report when going home she is going to use house coats instead of pants -sock on the left and boot on the right.  Patient reports that while at his about 5 steps away from the bed     Vision Baseline Vision/History: 1 Wears glasses Patient Visual Report: No change from baseline       Perception         Praxis         Pertinent Vitals/Pain Pain  Assessment Pain Assessment: 0-10 Pain Score: 8  Pain Location: R groin Pain Descriptors / Indicators: Aching, Guarding, Grimacing, Burning Pain Intervention(s): Limited activity within patient's tolerance, Patient requesting pain meds-RN notified     Extremity/Trunk Assessment Upper Extremity Assessment Upper Extremity Assessment: Overall WFL for tasks assessed           Communication Communication Communication: No apparent difficulties   Cognition   Behavior During Therapy: WFL for tasks assessed/performed                                 Following commands: Intact       Cueing  General Comments   Cueing Techniques: Verbal cues  continues to have bruising to R groin and thigh, mild strikethrough noted to honeycomb; HR elevated to 105bpm with SpO2 at 97% on RA. Endorses nausea post session likely secondary to increased pain levels, RN notified.   Exercises Other Exercises Other Exercises: Discussed with patient and roommate about home safety and decreasing fall risk at home.  About DME for tub shower combo with a shower chair or pinch.  As well as picking up loose carpets.  As well as with her pets around the house being careful.  Also nightlight for mobility to toilet at nighttime.   Shoulder Instructions      Home Living Family/patient expects to be discharged to:: Private residence Living Arrangements:  (Roommate of 21 years [place]) Available Help at Discharge: Family;Friend(s) Type of Home: Mobile home Home Access: Stairs to enter [employer] of Steps: 5 Entrance Stairs-Rails: Right;Left;Can reach both Home Layout: One level     Bathroom Shower/Tub: Theme Park Manager: Yes   Home Equipment: None          Prior Functioning/Environment Prior Level of Function : Independent/Modified Independent             Mobility Comments: Patient was independent mobility ADLs Comments:  Patient was independent in bathing and dressing as well as housework and in the yard.;  Has 2 dogs and cats    OT Problem List:     OT Treatment/Interventions:        OT Goals(Current goals can be found in the care plan section)   Acute Rehab OT Goals Patient Stated Goal: If the pain just can get better and I want to go home OT Goal Formulation: With patient/family Time For Goal Achievement: 11/29/23 Potential to Achieve Goals: Good   OT Frequency:       Co-evaluation              AM-PAC OT 6 Clicks Daily Activity     Outcome Measure Help from another person eating meals?: None Help from another person  taking care of personal grooming?: None Help from another person toileting, which includes using toliet, bedpan, or urinal?: None Help from another person bathing (including washing, rinsing, drying)?: None Help from another person to put on and taking off regular upper body clothing?: None Help from another person to put on and taking off regular lower body clothing?: None 6 Click Score: 24   End of Session Equipment Utilized During Treatment: Gait belt Nurse Communication: Patient requests pain meds  Activity Tolerance: Patient tolerated treatment well;Patient limited by pain Patient left: in chair;with family/visitor present                   Time: 8944-8877 OT Time Calculation (min): 27 min Charges:  OT General Charges $OT Visit: 1 Visit OT Evaluation $OT Eval Low Complexity: 1 Low OT Treatments $Self Care/Home Management : 8-22 mins   Darrielle Pflieger OTR/L,CLT 11/29/2023, 12:20 PM

## 2023-11-29 NOTE — Progress Notes (Addendum)
    Subjective  - POD #3, status post repair of right femoral pseudoaneurysm  She has been able to get a bed and walk around her balloon but still remains very sore   Physical Exam:  Right groin dressing dry.  Significant ecchymosis but no prominent hematoma.  The site is very tender to the touch. Neuro: No focal deficits       Assessment/Plan:  POD #3  Plan to continue to mobilize.  She is going to try and go up steps tomorrow with physical therapy as she has steps to decline at home.  Anticipate discharge on Monday  Continue aspirin , Brilinta , and statin  Wells Mikiya Nebergall 11/29/2023 12:14 PM --  Vitals:   11/29/23 0336 11/29/23 0729  BP: 116/70 110/67  Pulse: 76 73  Resp: 18 14  Temp: 98.3 F (36.8 C) 98.7 F (37.1 C)  SpO2: 95% 94%    Intake/Output Summary (Last 24 hours) at 11/29/2023 1214 Last data filed at 11/29/2023 0700 Gross per 24 hour  Intake 240 ml  Output 2050 ml  Net -1810 ml     Laboratory CBC    Component Value Date/Time   WBC 12.8 (H) 11/27/2023 1438   HGB 8.7 (L) 11/27/2023 1438   HGB 11.8 11/06/2023 1151   HCT 26.4 (L) 11/27/2023 1438   HCT 37.3 11/06/2023 1151   PLT 192 11/27/2023 1438   PLT 221 11/06/2023 1151    BMET    Component Value Date/Time   NA 138 11/27/2023 0625   NA 145 (H) 11/06/2023 1151   NA 140 06/24/2011 1709   K 4.3 11/27/2023 0625   K 4.3 06/24/2011 1709   CL 108 11/27/2023 0625   CL 105 06/24/2011 1709   CO2 22 11/27/2023 0625   CO2 30 06/24/2011 1709   GLUCOSE 139 (H) 11/27/2023 0625   GLUCOSE 101 (H) 06/24/2011 1709   BUN 13 11/27/2023 0625   BUN 14 11/06/2023 1151   BUN 17 06/24/2011 1709   CREATININE 0.93 11/27/2023 0625   CREATININE 1.11 (H) 05/30/2023 1258   CREATININE 0.91 06/24/2011 1709   CALCIUM  9.0 11/27/2023 0625   CALCIUM  9.4 06/24/2011 1709   GFRNONAA >60 11/27/2023 0625   GFRNONAA 56 (L) 05/30/2023 1258   GFRNONAA >60 06/24/2011 1709   GFRAA >60 07/28/2019 1048   GFRAA >60  06/24/2011 1709    COAG Lab Results  Component Value Date   INR 1.1 01/23/2022   INR 1.0 05/27/2019   INR 1.1 05/03/2019   No results found for: PTT  Antibiotics Anti-infectives (From admission, onward)    Start     Dose/Rate Route Frequency Ordered Stop   11/26/23 0949  vancomycin  (VANCOCIN ) powder  Status:  Discontinued          As needed 11/26/23 0949 11/26/23 1018   11/26/23 0948  gentamicin (GARAMYCIN) injection  Status:  Discontinued          As needed 11/26/23 0949 11/26/23 1018   11/24/23 1800  ceFAZolin  (ANCEF ) IVPB 2g/100 mL premix        2 g 200 mL/hr over 30 Minutes Intravenous Every 8 hours 11/24/23 1105 11/25/23 0319   11/24/23 0816  ceFAZolin  (ANCEF ) IVPB 2g/100 mL premix        2 g 200 mL/hr over 30 Minutes Intravenous 30 min pre-op 11/24/23 0816 11/24/23 1037        V. Malvina Serene CLORE, M.D., [facility]  Pager:  [phone]

## 2023-11-30 LAB — BPAM RBC
Blood Product Expiration Date: 202511112359
Blood Product Expiration Date: 202511112359
Blood Product Expiration Date: 202511202359
Blood Product Expiration Date: 202511202359
ISSUE DATE / TIME: 202510220931
ISSUE DATE / TIME: 202510220931
Unit Type and Rh: 5100
Unit Type and Rh: 5100
Unit Type and Rh: 7300
Unit Type and Rh: 7300

## 2023-11-30 LAB — TYPE AND SCREEN
ABO/RH(D): B POS
Antibody Screen: NEGATIVE
Unit division: 0
Unit division: 0
Unit division: 0
Unit division: 0

## 2023-11-30 NOTE — TOC Initial Note (Signed)
 Transition of Care [facility]) - Initial/Assessment Note    Patient Details  Name: Connie Cameron MRN: [id number] Date of Birth: Feb 02, 1961  Transition of Care [facility] CM/SW Contact:    Victory Jackquline RAMAN, RN Phone Number: 11/30/2023, 1:43 PM  Clinical Narrative: RNCM, spoke with the patient at the bedside. I introduced myself, my role, and explained that discharge planning recommendations would be discussed. PT recommended Home with Home Health/PT and RW. Patient is in agreement with HH/PT and RW and  would like for me to submitted the referrals to agencies in the area because she isn't familiar with the agencies in the area. Referral's sent for HH/PT and referral sent for RW via [employer].  RNCM will continue to follow for discharge planning/care coordination and update as applicable.              Expected Discharge Plan: Home w Home Health Services Barriers to Discharge: Continued Medical Work up   Patient Goals and CMS Choice            Expected Discharge Plan and Services       Living arrangements for the past 2 months: Single Family Home                 DME Arranged: Walker rolling DME Agency: [employer] Date DME Agency Contacted: 11/30/23   Representative spoke with at DME Agency: Referral sent via email            Prior Living Arrangements/Services Living arrangements for the past 2 months: Single Family Home Lives with:: Domestic Partner Patient language and need for interpreter reviewed:: Yes Do you feel safe going back to the place where you live?: Yes      Need for Family Participation in Patient Care: Yes (Comment) Care giver support system in place?: Yes (comment)   Criminal Activity/Legal Involvement Pertinent to Current Situation/Hospitalization: No - Comment as needed  Activities of Daily Living   ADL Screening (condition at time of admission) Independently performs ADLs?: Yes (appropriate for developmental age) Is the patient deaf or have difficulty  hearing?: No Does the patient have difficulty seeing, even when wearing glasses/contacts?: No Does the patient have difficulty concentrating, remembering, or making decisions?: No  Permission Sought/Granted                  Emotional Assessment Appearance:: Appears stated age, Well-Groomed Attitude/Demeanor/Rapport: Gracious, Engaged Affect (typically observed): Calm, Quiet, Pleasant Orientation: : Oriented to Self, Oriented to Place, Oriented to  Time, Oriented to Situation Alcohol / Substance Use: Not Applicable Psych Involvement: No (comment)  Admission diagnosis:  Carotid stenosis, left [I65.22] Patient Active Problem List   Diagnosis Date Noted   Carotid stenosis, left 11/24/2023   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 11/03/2023   Morbid obesity (HCC) 10/03/2023   Hiatal hernia 07/24/2023   Fatigue 03/04/2023   Iron deficiency anemia secondary to inadequate dietary iron intake 03/04/2023   B12 deficiency 03/04/2023   Anxiety 03/04/2023   Mixed hyperlipidemia 01/24/2022   NSVT (nonsustained ventricular tachycardia) (HCC) 01/24/2022   STEMI involving right coronary artery (HCC) 01/23/2022   History of gastric ulcer    Erosive esophagitis    Esophageal dysphagia    Acute gastric ulcer without hemorrhage or perforation    Schatzki's ring    Gastric erythema    BPPV (benign paroxysmal positional vertigo) 06/29/2020   GERD (gastroesophageal reflux disease) 06/26/2020   Depression with anxiety 06/26/2020   Xerostomia 02/07/2020   Chemotherapy-induced neutropenia  05/11/2019   Squamous cell carcinoma of head and neck 02/08/2019   Carcinoma of base of tongue (HCC) 02/04/2019   Atypical chest pain 04/22/2014   PCP:  Ziglar, Susan K, MD Pharmacy:   [facility] [address] [place] - [address] [place] [address] Phone: [phone] Fax: [phone]     Social Drivers of Health (SDOH) Social History: SDOH  Screenings   Food Insecurity: No Food Insecurity (11/24/2023)  Housing: Low Risk  (11/24/2023)  Transportation Needs: No Transportation Needs (11/24/2023)  Utilities: Not At Risk (11/24/2023)  Depression (PHQ2-9): Low Risk  (11/03/2023)  Tobacco Use: Low Risk  (11/24/2023)  Health Literacy: Adequate Health Literacy (03/04/2023)   SDOH Interventions:     Readmission Risk Interventions     No data to display

## 2023-11-30 NOTE — Progress Notes (Signed)
 Physical Therapy Treatment Patient Details Name: Connie Cameron MRN: [id number] DOB: 12-31-1960 Today's Date: 11/30/2023   History of Present Illness admitted for acute hospitalization s/p L CEA via R femoral artery (11/24/23), complicated by post-op hematoma to R groin (suspected psuedoaneurysm).  Hospital course additionally significant for R femoral artery pseudoaneurysm repair via L femoral artery (11/24/23) and subsequent R femoral artery exploration, evacuation of hematoma and ligation of profunda femoris artery (11/26/23)    PT Comments  Out of bed with inc time but no assist.  She is steady in sitting with assist to don CAM boot. Stated friend can help at home.  Stands and is able to progress gait across room to window then back to recliner  static standing for about 1 minute prior to sitting in recliner.  Purwic left off in chair and encouraged to use bathroom during the day with nursing.  Agrees to stair training in AM.     If plan is discharge home, recommend the following: A little help with walking and/or transfers;A little help with bathing/dressing/bathroom   Can travel by private vehicle        Equipment Recommendations  Rolling walker (2 wheels)    Recommendations for Other Services       Precautions / Restrictions Precautions Precautions: Fall Restrictions Weight Bearing Restrictions Per Provider Order: Yes RLE Weight Bearing Per Provider Order: Weight bearing as tolerated Other Position/Activity Restrictions: Per patient, recent R ankle fracture; WBAT with boot in place (present in room)     Mobility  Bed Mobility Overal bed mobility: Needs Assistance Bed Mobility: Supine to Sit     Supine to sit: Supervision       Patient Response: Cooperative  Transfers Overall transfer level: Needs assistance Equipment used: Rolling walker (2 wheels) Transfers: Sit to/from Stand Sit to Stand: Contact guard assist                 Ambulation/Gait Ambulation/Gait assistance: Supervision, Contact guard assist Gait Distance (Feet): 25 Feet Assistive device: Rolling walker (2 wheels) Gait Pattern/deviations: Step-through pattern, Decreased step length - right, Decreased step length - left, Decreased stance time - right Gait velocity: dec     General Gait Details: improved gait distance today but generally limited by discomfort.  gait steady. cam boot donned   Stairs             Wheelchair Mobility     Tilt Bed Tilt Bed Patient Response: Cooperative  Modified Rankin (Stroke Patients Only)       Balance Overall balance assessment: Needs assistance Sitting-balance support: No upper extremity supported, Feet supported Sitting balance-Leahy Scale: Normal     Standing balance support: Bilateral upper extremity supported Standing balance-Leahy Scale: Good                              Communication Communication Communication: No apparent difficulties  Cognition Arousal: Alert Behavior During Therapy: WFL for tasks assessed/performed   PT - Cognitive impairments: No apparent impairments                         Following commands: Intact      Cueing Cueing Techniques: Verbal cues  Exercises      General Comments        Pertinent Vitals/Pain Pain Assessment Pain Assessment: Faces Faces Pain Scale: Hurts whole lot Pain Location: R groin Pain Descriptors / Indicators: Burning Pain Intervention(s): Limited activity  within patient's tolerance, Monitored during session, Repositioned    Home Living Family/patient expects to be discharged to:: Private residence Living Arrangements: Alone                      Prior Function            PT Goals (current goals can now be found in the care plan section) Progress towards PT goals: Progressing toward goals    Frequency    Min 2X/week      PT Plan      Co-evaluation              AM-PAC PT  6 Clicks Mobility   Outcome Measure  Help needed turning from your back to your side while in a flat bed without using bedrails?: None Help needed moving from lying on your back to sitting on the side of a flat bed without using bedrails?: None Help needed moving to and from a bed to a chair (including a wheelchair)?: A Little Help needed standing up from a chair using your arms (e.g., wheelchair or bedside chair)?: A Little Help needed to walk in hospital room?: A Little Help needed climbing 3-5 steps with a railing? : A Lot 6 Click Score: 19    End of Session Equipment Utilized During Treatment: Gait belt Activity Tolerance: Patient tolerated treatment well;Patient limited by pain Patient left: in chair;with call bell/phone within reach;with chair alarm set Nurse Communication: Mobility status;Patient requests pain meds PT Visit Diagnosis: Repeated falls (R29.6);Difficulty in walking, not elsewhere classified (R26.2);Pain Pain - Right/Left: Right Pain - part of body: Hip     Time: 8956-8947 PT Time Calculation (min) (ACUTE ONLY): 9 min  Charges:    $Gait Training: 8-22 mins PT General Charges $$ ACUTE PT VISIT: 1 Visit                   Lauraine Gills, PTA 11/30/23, 1:33 PM

## 2023-11-30 NOTE — TOC CM/SW Note (Signed)
 Rolling Walker: The beneficiary has a mobility limitation that significantly impairs his/her ability to participate in one or more mobility-related activities of daily living (MRADL) in the home. The patient is able to safely use the walker. The functional mobility deficit can be sufficiently resolved by use of walker.

## 2023-11-30 NOTE — Plan of Care (Signed)
   Problem: Education: Goal: Knowledge of General Education information will improve Description: Including pain rating scale, medication(s)/side effects and non-pharmacologic comfort measures Outcome: Progressing   Problem: Clinical Measurements: Goal: Ability to maintain clinical measurements within normal limits will improve Outcome: Progressing Goal: Will remain free from infection Outcome: Progressing

## 2023-11-30 NOTE — Progress Notes (Signed)
    Subjective  - POD # 4, s/p repair of right femoral pseudoaneurysm  Pain is better controlled.  She has been able to walk around the room.  She has not yet walked up steps and says that that is going to happen tomorrow morning   Physical Exam:  Dressing removed from right groin.  Incision is clean dry and intact.       Assessment/Plan:  POD #4  Plan for steps tomorrow with physical therapy.  Anticipate discharge if she can do this.  Continue aspirin , Brilinta , and high-dose statin  Wells Nolan Tuazon 11/30/2023 11:46 AM --  Vitals:   11/30/23 0508 11/30/23 0837  BP: 101/71 102/63  Pulse: 63 76  Resp: 16 16  Temp: 97.9 F (36.6 C) 98 F (36.7 C)  SpO2: 97% 97%    Intake/Output Summary (Last 24 hours) at 11/30/2023 1146 Last data filed at 11/30/2023 0900 Gross per 24 hour  Intake 600 ml  Output 1850 ml  Net -1250 ml     Laboratory CBC    Component Value Date/Time   WBC 12.8 (H) 11/27/2023 1438   HGB 8.7 (L) 11/27/2023 1438   HGB 11.8 11/06/2023 1151   HCT 26.4 (L) 11/27/2023 1438   HCT 37.3 11/06/2023 1151   PLT 192 11/27/2023 1438   PLT 221 11/06/2023 1151    BMET    Component Value Date/Time   NA 138 11/27/2023 0625   NA 145 (H) 11/06/2023 1151   NA 140 06/24/2011 1709   K 4.3 11/27/2023 0625   K 4.3 06/24/2011 1709   CL 108 11/27/2023 0625   CL 105 06/24/2011 1709   CO2 22 11/27/2023 0625   CO2 30 06/24/2011 1709   GLUCOSE 139 (H) 11/27/2023 0625   GLUCOSE 101 (H) 06/24/2011 1709   BUN 13 11/27/2023 0625   BUN 14 11/06/2023 1151   BUN 17 06/24/2011 1709   CREATININE 0.93 11/27/2023 0625   CREATININE 1.11 (H) 05/30/2023 1258   CREATININE 0.91 06/24/2011 1709   CALCIUM  9.0 11/27/2023 0625   CALCIUM  9.4 06/24/2011 1709   GFRNONAA >60 11/27/2023 0625   GFRNONAA 56 (L) 05/30/2023 1258   GFRNONAA >60 06/24/2011 1709   GFRAA >60 07/28/2019 1048   GFRAA >60 06/24/2011 1709    COAG Lab Results  Component Value Date   INR 1.1 01/23/2022    INR 1.0 05/27/2019   INR 1.1 05/03/2019   No results found for: PTT  Antibiotics Anti-infectives (From admission, onward)    Start     Dose/Rate Route Frequency Ordered Stop   11/26/23 0949  vancomycin  (VANCOCIN ) powder  Status:  Discontinued          As needed 11/26/23 0949 11/26/23 1018   11/26/23 0948  gentamicin (GARAMYCIN) injection  Status:  Discontinued          As needed 11/26/23 0949 11/26/23 1018   11/24/23 1800  ceFAZolin  (ANCEF ) IVPB 2g/100 mL premix        2 g 200 mL/hr over 30 Minutes Intravenous Every 8 hours 11/24/23 1105 11/25/23 0319   11/24/23 0816  ceFAZolin  (ANCEF ) IVPB 2g/100 mL premix        2 g 200 mL/hr over 30 Minutes Intravenous 30 min pre-op 11/24/23 0816 11/24/23 1037        V. Malvina Serene CLORE, M.D., [facility] Pager:  [phone]

## 2023-11-30 NOTE — Plan of Care (Signed)

## 2023-12-01 DIAGNOSIS — T81718A Complication of other artery following a procedure, not elsewhere classified, initial encounter: Secondary | ICD-10-CM

## 2023-12-01 LAB — GLUCOSE, CAPILLARY: Glucose-Capillary: 207 mg/dL — ABNORMAL HIGH (ref 70–99)

## 2023-12-01 MED ORDER — HYDROCODONE-ACETAMINOPHEN 5-325 MG PO TABS: 1.0000 | ORAL_TABLET | Freq: Four times a day (QID) | ORAL | 0 refills | Status: DC | PRN

## 2023-12-01 NOTE — Discharge Summary (Signed)
 [facility]    Discharge Summary    Patient ID:  Connie Cameron MRN: [id number] DOB/AGE: 63/12/1960 63 y.o.  Admit date: 11/24/2023 Discharge date: 12/01/2023 Date of Surgery: 11/26/2023 Surgeon: Surgeon(s): Marea Selinda RAMAN, MD  Admission Diagnosis: Carotid stenosis, left [I65.22]  Discharge Diagnoses:  Carotid stenosis, left [I65.22]  Secondary Diagnoses: 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(HCC)     Procedure(s): EXPLORATION, ARTERY, FEMORAL  Discharged Condition: good  HPI:  admitted for acute hospitalization s/p L CEA via R femoral artery (11/24/23), complicated by post-op hematoma to R groin (suspected psuedoaneurysm). Hospital course additionally significant for R femoral artery pseudoaneurysm repair via L femoral artery (11/24/23) and subsequent R femoral artery exploration, evacuation of hematoma and ligation of profunda femoris artery (11/26/23)   Hospital Course:  Connie Cameron is a 63 y.o. female is S/P Right femoral artery pseudoaneurysm repair.  Patient is ambulating well and able to escalate and de-escalate stairs today.  She is ambulating the halls without difficulty.  Does endorse soreness to her right but is tolerable today.  She is eating well and urinating well.  No complications to her groin to note.  Patient will return to clinic as scheduled.  Plan is to discharge patient later today.  Patient is being discharged on aspirin  81 mg daily, Brilinta  90 mg twice daily, and Lipitor  80 mg daily.  Patient was counseled and instructed not to miss or skip taking any of these medications as it will alter the outcome of her procedure and her surgery.  Patient verbalizes her understanding.  I spent greater than 60 minutes today in developing, implementing, teaching and discharging this patient.  Extubated: POD # 0 Physical  Exam:  Alert notes x3, no acute distress Face: Symmetrical.  Tongue is midline. Neck: Trachea is midline.  No swelling or bruising. Cardiovascular: Regular rate and rhythm Pulmonary: Clear to auscultation bilaterally Abdomen: Soft, nontender, nondistended Right groin access: Clean dry and intact.  No swelling or drainage noted Left lower extremity: Thigh soft.  Calf soft.  Extremities warm distally toes.  Hard to palpate pedal pulses however the foot is warm is her good capillary refill. Right lower extremity: Thigh soft.  Calf soft.  Extremities warm distally toes.  Hard to palpate pedal pulses however the foot is warm is her good capillary refill. Neurological: No deficits noted   Post-op wounds:  clean, dry, intact or healing well  Pt. Ambulating, voiding and taking PO diet without difficulty. Pt pain controlled with PO pain meds.  Labs:  As below  Complications: none  Consults:    Significant Diagnostic Studies: CBC Lab Results  Component Value Date   WBC 12.8 (H) 11/27/2023   HGB 8.7 (L) 11/27/2023   HCT 26.4 (L) 11/27/2023   MCV 88.9 11/27/2023   PLT 192 11/27/2023    BMET    Component Value Date/Time   NA 138 11/27/2023 0625   NA 145 (H) 11/06/2023 1151   NA 140 06/24/2011 1709   K 4.3 11/27/2023 0625   K 4.3 06/24/2011 1709   CL 108 11/27/2023 0625   CL 105 06/24/2011 1709   CO2 22 11/27/2023 0625   CO2 30 06/24/2011 1709   GLUCOSE 139 (H) 11/27/2023 0625   GLUCOSE 101 (H) 06/24/2011 1709   BUN 13 11/27/2023 0625   BUN 14 11/06/2023  1151   BUN 17 06/24/2011 1709   CREATININE 0.93 11/27/2023 0625   CREATININE 1.11 (H) 05/30/2023 1258   CREATININE 0.91 06/24/2011 1709   CALCIUM  9.0 11/27/2023 0625   CALCIUM  9.4 06/24/2011 1709   GFRNONAA >60 11/27/2023 0625   GFRNONAA 56 (L) 05/30/2023 1258   GFRNONAA >60 06/24/2011 1709   GFRAA >60 07/28/2019 1048   GFRAA >60 06/24/2011 1709   COAG Lab Results  Component Value Date   INR 1.1 01/23/2022    INR 1.0 05/27/2019   INR 1.1 05/03/2019     Disposition:  Discharge to :Home  Allergies as of 12/01/2023       Reactions   Chlorhexidine     Pt doesn't have anything that requires CHG bath        Medication List     TAKE these medications    acetaminophen -codeine 300-30 MG tablet Commonly known as: TYLENOL  #3 Take 1 tablet by mouth every 6 (six) hours as needed.   ALPRAZolam  1 MG tablet Commonly known as: XANAX  Take 1 tablet (1 mg total) by mouth 2 (two) times daily as needed for anxiety.   aspirin  81 MG chewable tablet Chew 1 tablet (81 mg total) by mouth daily.   atorvastatin  80 MG tablet Commonly known as: LIPITOR  TAKE 1 TABLET BY MOUTH EVERY DAY   Brilinta  90 MG Tabs tablet Generic drug: ticagrelor  TAKE 1 TABLET BY MOUTH 2 TIMES DAILY.   DSS 100 MG Caps Take 1 capsule by mouth every other day.   gabapentin  300 MG capsule Commonly known as: NEURONTIN  Take 1 capsule (300 mg total) by mouth 2 (two) times daily.   HYDROcodone -acetaminophen  5-325 MG tablet Commonly known as: NORCO/VICODIN Take 1 tablet by mouth every 6 (six) hours as needed. What changed: Another medication with the same name was added. Make sure you understand how and when to take each.   HYDROcodone -acetaminophen  5-325 MG tablet Commonly known as: NORCO/VICODIN Take 1-2 tablets by mouth every 6 (six) hours as needed for moderate pain (pain score 4-6). What changed: You were already taking a medication with the same name, and this prescription was added. Make sure you understand how and when to take each.   isosorbide  mononitrate 30 MG 24 hr tablet Commonly known as: IMDUR  TAKE 1/2 OF A TABLET (15 MG TOTAL) BY MOUTH DAILY   meclizine  12.5 MG tablet Commonly known as: ANTIVERT  Take 1 tablet (12.5 mg total) by mouth 3 (three) times daily as needed for dizziness or nausea.   metFORMIN  500 MG 24 hr tablet Commonly known as: GLUCOPHAGE -XR Take 1 tablet (500 mg total) by mouth daily with  breakfast.   methocarbamol 500 MG tablet Commonly known as: ROBAXIN Take 500 mg by mouth at bedtime.   metoprolol  succinate 25 MG 24 hr tablet Commonly known as: TOPROL -XL TAKE 0.5 TABLETS BY MOUTH DAILY. TAKE WITH OR IMMEDIATELY FOLLOWING A MEAL.   sertraline  50 MG tablet Commonly known as: ZOLOFT  Take 1 tablet (50 mg total) by mouth daily.   traMADol  50 MG tablet Commonly known as: ULTRAM  Take 50 mg by mouth every 6 (six) hours as needed.   Vitamin D (Ergocalciferol) 1.25 MG (50000 UNIT) Caps capsule Commonly known as: DRISDOL Take 50,000 Units by mouth once a week.               Durable Medical Equipment  (From admission, onward)           Start     Ordered   12/01/23 1035  For home use only DME Walker rolling  Once       Question Answer Comment  Walker: With 5 Inch Wheels   Patient needs a walker to treat with the following condition Pseudoaneurysm of femoral artery      12/01/23 1034   11/30/23 1701  For home use only DME Walker rolling  Once       Question Answer Comment  Walker: With 5 Inch Wheels   Patient needs a walker to treat with the following condition Ambulatory dysfunction      11/30/23 1701           Verbal and written Discharge instructions given to the patient. Wound care per Discharge AVS  Follow-up Information     Delores Orvin BRAVO, NP Follow up in 3 week(s).   Specialty: Vascular Surgery Why: Post Op Follow Up with Arterial Duplex Ultrasounds and ABI's Contact information: [address] [place] [address] [phone]                 Signed: Gwendlyn JONELLE Shank, NP  12/01/2023, 10:59 AM

## 2023-12-01 NOTE — TOC Transition Note (Addendum)
 Transition of Care [facility]) - Discharge Note   Patient Details  Name: Connie Cameron MRN: [id number] Date of Birth: [birth year]  Transition of Care [facility] CM/SW Contact:  Daved JONETTA Hamilton, RN Phone Number: 12/01/2023, 12:12 PM   Clinical Narrative:     Patient will be transported home from hospital by [employer]- roommate. RW has been delivered to room.  Patient agreeable to Home Health PT and does not have a preference of companies. Patient given the  opportunity to ask questions or advise further needs, patient verbalized none at this time.  Final next level of care: Home w Home Health Services Barriers to Discharge: Barriers Resolved   Patient Goals and CMS Choice            Discharge Placement                    Patient and family notified of of transfer: 12/01/23  Discharge Plan and Services Additional resources added to the After Visit Summary for                  DME Arranged: Walker rolling DME Agency: [employer] Date DME Agency Contacted: 11/30/23   Representative spoke with at DME Agency: Referral sent via email HH Arranged: PT [facility] Agency: [facility] Date [facility] Agency Contacted: 12/01/23 Time HH Agency Contacted: 1145 Representative spoke with at [facility] Agency: [facility]  Social Drivers of Health (SDOH) Interventions SDOH Screenings   Food Insecurity: No Food Insecurity (11/24/2023)  Housing: Low Risk  (11/24/2023)  Transportation Needs: No Transportation Needs (11/24/2023)  Utilities: Not At Risk (11/24/2023)  Depression (PHQ2-9): Low Risk  (11/03/2023)  Tobacco Use: Low Risk  (11/24/2023)  Health Literacy: Adequate Health Literacy (03/04/2023)     Readmission Risk Interventions     No data to display

## 2023-12-01 NOTE — Progress Notes (Signed)
 Progress Note    12/01/2023 9:29 AM 5 Days Post-Op  Subjective:  Santina Trillo is a 63 yo female who is now postop day 5 from repair of a right femoral pseudoaneurysm.  Patient is resting comfortably this morning sitting in a bedside chair.  I discussed the possibility of going home today.  Patient would rather defer to physical therapy after she is able to walk up and down stairs that she has stairs at home.   Vitals:   12/01/23 0429 12/01/23 0800  BP: 103/70 120/74  Pulse: 68 (!) 58  Resp: 16   Temp: 98.4 F (36.9 C) 98.5 F (36.9 C)  SpO2: 96% 97%   Physical Exam: Cardiac:  RRR, normal S1 and S2.  No murmurs appreciated. Lungs: Clear throughout on auscultation.  No rales rhonchi or wheezing noted.  Nonlabored breathing. Incisions: Right groin incision.  Removed dressing.  Incision is clean dry and intact.  No hematoma seroma or infection is noted. Extremities: All extremities warm to touch with palpable pulses. Abdomen: Positive bowel sounds throughout, soft, nontender nondistended. Neurologic: Alert and oriented x 3 answers all questions follows commands.  CBC    Component Value Date/Time   WBC 12.8 (H) 11/27/2023 1438   RBC 2.97 (L) 11/27/2023 1438   HGB 8.7 (L) 11/27/2023 1438   HGB 11.8 11/06/2023 1151   HCT 26.4 (L) 11/27/2023 1438   HCT 37.3 11/06/2023 1151   PLT 192 11/27/2023 1438   PLT 221 11/06/2023 1151   MCV 88.9 11/27/2023 1438   MCV 93 11/06/2023 1151   MCV 87 06/24/2011 1709   MCH 29.3 11/27/2023 1438   MCHC 33.0 11/27/2023 1438   RDW 15.5 11/27/2023 1438   RDW 13.8 11/06/2023 1151   RDW 14.2 06/24/2011 1709   LYMPHSABS 1.1 11/06/2023 1151   MONOABS 0.6 05/30/2023 1258   EOSABS 0.2 11/06/2023 1151   BASOSABS 0.0 11/06/2023 1151    BMET    Component Value Date/Time   NA 138 11/27/2023 0625   NA 145 (H) 11/06/2023 1151   NA 140 06/24/2011 1709   K 4.3 11/27/2023 0625   K 4.3 06/24/2011 1709   CL 108 11/27/2023 0625   CL 105 06/24/2011  1709   CO2 22 11/27/2023 0625   CO2 30 06/24/2011 1709   GLUCOSE 139 (H) 11/27/2023 0625   GLUCOSE 101 (H) 06/24/2011 1709   BUN 13 11/27/2023 0625   BUN 14 11/06/2023 1151   BUN 17 06/24/2011 1709   CREATININE 0.93 11/27/2023 0625   CREATININE 1.11 (H) 05/30/2023 1258   CREATININE 0.91 06/24/2011 1709   CALCIUM  9.0 11/27/2023 0625   CALCIUM  9.4 06/24/2011 1709   GFRNONAA >60 11/27/2023 0625   GFRNONAA 56 (L) 05/30/2023 1258   GFRNONAA >60 06/24/2011 1709   GFRAA >60 07/28/2019 1048   GFRAA >60 06/24/2011 1709    INR    Component Value Date/Time   INR 1.1 01/23/2022 0652     Intake/Output Summary (Last 24 hours) at 12/01/2023 0929 Last data filed at 11/30/2023 1846 Gross per 24 hour  Intake 480 ml  Output 450 ml  Net 30 ml     Assessment/Plan:  63 y.o. female is s/p repair of her right femoral pseudoaneurysm.  5 Days Post-Op   PLAN  Patient is resting comfortably in a bedside chair this morning.  She is waiting to work with physical therapy on getting up and down stairs that she has stairs at home.  She defers any decision about discharge  until after she works with physical therapy this morning.  Patient does well she can be discharged to home.  DVT prophylaxis: ASA 81 mg daily, Brilinta  90 mg twice daily.   Gwendlyn JONELLE Shank [facility] 12/01/2023 9:29 AM

## 2023-12-01 NOTE — Discharge Instructions (Addendum)
 Vascular Surgery Discharge Instructions:  Do not lift anything heavy for the next 2 weeks.  Do not lift anything more than a gallon of milk.  Do not drive for the next 3 weeks or until after you have been seen in follow-up by vein and vascular services.  You may shower tomorrow on 12/02/2023 after getting home.  Shower with the dressing to your groin in place.  Remove immediately after showering and pat completely dry.  Keep the area clean and dry at all times.  If you feel you need to cover the area with some gauze and some bandage taped needs to do so.  Follow-up with vein and vascular services as scheduled.  Home Health  [facility] [address] [place] [address]  Phone [phone]

## 2023-12-01 NOTE — Progress Notes (Signed)
 Physical Therapy Treatment Patient Details Name: Connie Cameron MRN: [id number] DOB: Mar 20, 1960 Today's Date: 12/01/2023   History of Present Illness admitted for acute hospitalization s/p L CEA via R femoral artery (11/24/23), complicated by post-op hematoma to R groin (suspected psuedoaneurysm).  Hospital course additionally significant for R femoral artery pseudoaneurysm repair via L femoral artery (11/24/23) and subsequent R femoral artery exploration, evacuation of hematoma and ligation of profunda femoris artery (11/26/23)    PT Comments  In chair and ready to try stairs.  She is able to go up/down 6 steps with bilateral rails to simulate home.  Reviewed step pattern.  She then walks an additional 61' with RW.  Pt stated she feels ready for discharge home and is comfortable with mobility.  Will need RW.  Updated team.   If plan is discharge home, recommend the following: A little help with walking and/or transfers;A little help with bathing/dressing/bathroom   Can travel by private vehicle        Equipment Recommendations  Rolling walker (2 wheels)    Recommendations for Other Services       Precautions / Restrictions Precautions Precautions: Fall Restrictions Weight Bearing Restrictions Per Provider Order: Yes RLE Weight Bearing Per Provider Order: Weight bearing as tolerated Other Position/Activity Restrictions: Per patient, recent R ankle fracture; WBAT with boot in place (present in room)     Mobility  Bed Mobility               General bed mobility comments: in recliner befoe and after with CAM boot conned Patient Response: Cooperative  Transfers Overall transfer level: Needs assistance Equipment used: Rolling walker (2 wheels) Transfers: Sit to/from Stand Sit to Stand: Supervision                Ambulation/Gait Ambulation/Gait assistance: Supervision, Contact guard assist Gait Distance (Feet): 80 Feet Assistive device: Rolling walker (2  wheels) Gait Pattern/deviations: Step-through pattern, Decreased step length - right, Decreased step length - left, Decreased stance time - right Gait velocity: dec         Stairs Stairs: Yes Stairs assistance: Contact guard assist Stair Management: Two rails, Step to pattern, Forwards Number of Stairs: 6 General stair comments: to simulate home   Wheelchair Mobility     Tilt Bed Tilt Bed Patient Response: Cooperative  Modified Rankin (Stroke Patients Only)       Balance Overall balance assessment: Needs assistance Sitting-balance support: Feet supported Sitting balance-Leahy Scale: Normal     Standing balance support: Bilateral upper extremity supported Standing balance-Leahy Scale: Good                              Communication Communication Communication: No apparent difficulties  Cognition Arousal: Alert Behavior During Therapy: WFL for tasks assessed/performed   PT - Cognitive impairments: No apparent impairments                         Following commands: Intact      Cueing Cueing Techniques: Verbal cues  Exercises      General Comments        Pertinent Vitals/Pain Pain Assessment Pain Assessment: Faces Faces Pain Scale: Hurts a little bit Pain Location: R groin Pain Descriptors / Indicators: Burning Pain Intervention(s): Limited activity within patient's tolerance, Monitored during session, Premedicated before session    Home Living  Prior Function            PT Goals (current goals can now be found in the care plan section) Progress towards PT goals: Progressing toward goals    Frequency    Min 2X/week      PT Plan      Co-evaluation              AM-PAC PT 6 Clicks Mobility   Outcome Measure  Help needed turning from your back to your side while in a flat bed without using bedrails?: None Help needed moving from lying on your back to sitting on the side  of a flat bed without using bedrails?: None Help needed moving to and from a bed to a chair (including a wheelchair)?: None Help needed standing up from a chair using your arms (e.g., wheelchair or bedside chair)?: None Help needed to walk in hospital room?: A Little Help needed climbing 3-5 steps with a railing? : A Little 6 Click Score: 22    End of Session Equipment Utilized During Treatment: Gait belt Activity Tolerance: Patient tolerated treatment well;Patient limited by pain Patient left: in chair;with call bell/phone within reach;with chair alarm set Nurse Communication: Mobility status;Patient requests pain meds PT Visit Diagnosis: Repeated falls (R29.6);Difficulty in walking, not elsewhere classified (R26.2);Pain Pain - Right/Left: Right Pain - part of body: Hip     Time: 1020-1029 PT Time Calculation (min) (ACUTE ONLY): 9 min  Charges:    $Gait Training: 8-22 mins PT General Charges $$ ACUTE PT VISIT: 1 Visit                  Lauraine Gills, PTA 12/01/23, 10:37 AM

## 2023-12-02 ENCOUNTER — Telehealth: Payer: Self-pay

## 2023-12-02 NOTE — Telephone Encounter (Signed)
 Copied from CRM [id number]. Topic: General - Other >> Dec 02, 2023  1:37 PM Cleave MATSU wrote: Reason for CRM: sheila from [facility] stated that Ms.Scardino is at home and they will reach out to Dr. Ziglar if they have any questions while she's doing home health with them

## 2023-12-03 ENCOUNTER — Telehealth: Payer: Self-pay | Admitting: Family Medicine

## 2023-12-03 NOTE — Telephone Encounter (Signed)
 Copied from CRM [id number]. Topic: Clinical - Home Health Verbal Orders >> Dec 03, 2023  1:57 PM Zebedee SAUNDERS wrote: Caller/Agency: [facility] per Jhonnie Rushing Number: [phone] secure line Service Requested: Physical Therapy Frequency: 2week for 2 weeks, 1 week 2 for weeks Any new concerns about the patient? No

## 2023-12-03 NOTE — Telephone Encounter (Signed)
**Note De-identified  [place] Obfuscation** Please advise 

## 2023-12-04 ENCOUNTER — Inpatient Hospital Stay (HOSPITAL_BASED_OUTPATIENT_CLINIC_OR_DEPARTMENT_OTHER): Payer: Self-pay | Admitting: Nurse Practitioner

## 2023-12-04 ENCOUNTER — Telehealth (INDEPENDENT_AMBULATORY_CARE_PROVIDER_SITE_OTHER): Payer: Self-pay

## 2023-12-04 ENCOUNTER — Encounter: Payer: Self-pay | Admitting: Nurse Practitioner

## 2023-12-04 ENCOUNTER — Inpatient Hospital Stay: Payer: Self-pay | Attending: Internal Medicine

## 2023-12-04 ENCOUNTER — Inpatient Hospital Stay: Payer: Self-pay

## 2023-12-04 VITALS — BP 121/82 | HR 94 | Temp 98.4°F | Resp 16 | Wt 177.0 lb

## 2023-12-04 DIAGNOSIS — E538 Deficiency of other specified B group vitamins: Secondary | ICD-10-CM

## 2023-12-04 DIAGNOSIS — T451X5A Adverse effect of antineoplastic and immunosuppressive drugs, initial encounter: Secondary | ICD-10-CM

## 2023-12-04 DIAGNOSIS — Z79899 Other long term (current) drug therapy: Secondary | ICD-10-CM | POA: Insufficient documentation

## 2023-12-04 DIAGNOSIS — Z8589 Personal history of malignant neoplasm of other organs and systems: Secondary | ICD-10-CM | POA: Diagnosis not present

## 2023-12-04 DIAGNOSIS — C01 Malignant neoplasm of base of tongue: Secondary | ICD-10-CM

## 2023-12-04 DIAGNOSIS — Z08 Encounter for follow-up examination after completed treatment for malignant neoplasm: Secondary | ICD-10-CM

## 2023-12-04 LAB — CMP (CANCER CENTER ONLY)
ALT: 13 U/L (ref 0–44)
AST: 23 U/L (ref 15–41)
Albumin: 4 g/dL (ref 3.5–5.0)
Alkaline Phosphatase: 90 U/L (ref 38–126)
Anion gap: 11 (ref 5–15)
BUN: 13 mg/dL (ref 8–23)
CO2: 23 mmol/L (ref 22–32)
Calcium: 9.4 mg/dL (ref 8.9–10.3)
Chloride: 103 mmol/L (ref 98–111)
Creatinine: 1.11 mg/dL — ABNORMAL HIGH (ref 0.44–1.00)
GFR, Estimated: 56 mL/min — ABNORMAL LOW (ref >60)
Glucose, Bld: 135 mg/dL — ABNORMAL HIGH (ref 70–99)
Potassium: 3.9 mmol/L (ref 3.5–5.1)
Sodium: 137 mmol/L (ref 135–145)
Total Bilirubin: 1.6 mg/dL — ABNORMAL HIGH (ref 0.0–1.2)
Total Protein: 7.5 g/dL (ref 6.5–8.1)

## 2023-12-04 LAB — CBC WITH DIFFERENTIAL (CANCER CENTER ONLY)
Abs Immature Granulocytes: 0.05 K/uL (ref 0.00–0.07)
Basophils Absolute: 0 K/uL (ref 0.0–0.1)
Basophils Relative: 1 %
Eosinophils Absolute: 0.3 K/uL (ref 0.0–0.5)
Eosinophils Relative: 4 %
HCT: 31.4 % — ABNORMAL LOW (ref 36.0–46.0)
Hemoglobin: 10 g/dL — ABNORMAL LOW (ref 12.0–15.0)
Immature Granulocytes: 1 %
Lymphocytes Relative: 15 %
Lymphs Abs: 1.1 K/uL (ref 0.7–4.0)
MCH: 28.9 pg (ref 26.0–34.0)
MCHC: 31.8 g/dL (ref 30.0–36.0)
MCV: 90.8 fL (ref 80.0–100.0)
Monocytes Absolute: 0.5 K/uL (ref 0.1–1.0)
Monocytes Relative: 7 %
Neutro Abs: 5.2 K/uL (ref 1.7–7.7)
Neutrophils Relative %: 72 %
Platelet Count: 349 K/uL (ref 150–400)
RBC: 3.46 MIL/uL — ABNORMAL LOW (ref 3.87–5.11)
RDW: 14.8 % (ref 11.5–15.5)
WBC Count: 7.1 K/uL (ref 4.0–10.5)
nRBC: 0 % (ref 0.0–0.2)

## 2023-12-04 LAB — VITAMIN B12: Vitamin B-12: 793 pg/mL (ref 180–914)

## 2023-12-04 MED ORDER — CYANOCOBALAMIN 1000 MCG/ML IJ SOLN
1000.0000 ug | Freq: Once | INTRAMUSCULAR | Status: AC
  Administered 2023-12-04: 1000 ug via INTRAMUSCULAR
  Filled 2023-12-04: qty 1

## 2023-12-04 NOTE — Telephone Encounter (Signed)
 I was unable to leave a message for patient at this time in reference to her incision on her hip to see if she could upload a picture to her mychart, her voicemail box has no been set up at this time.

## 2023-12-04 NOTE — Progress Notes (Signed)
 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  HISTORY OF PRESENTING ILLNESS: Patient ambulating independently. Alone.  Connie Cameron 63 y.o. female with history of base of tongue squamous cell carcinoma and b12 deficiency returns to clinic for follow up. She denies head and/or neck pain. No difficulty swallowing. Denies new lumps or masses. No unintentional weight loss. She continues follow up with ENT.   Review of Systems  Constitutional:  Positive for malaise/fatigue. Negative for chills, diaphoresis, fever and weight loss.  HENT:  Negative for nosebleeds and sore throat.   Eyes:  Negative for double vision.  Respiratory:  Negative for cough, hemoptysis, sputum production, shortness of breath and wheezing.   Cardiovascular:  Negative for chest pain, palpitations, orthopnea and leg swelling.  Gastrointestinal:  Negative for abdominal pain, blood in stool, constipation, diarrhea, heartburn, melena, nausea and vomiting.  Genitourinary:  Negative for dysuria, frequency and urgency.  Musculoskeletal:  Positive for back pain and joint pain.  Skin: Negative.  Negative for itching and rash.  Neurological:  Negative for dizziness, tingling, focal weakness, weakness and headaches.  Endo/Heme/Allergies:  Does not bruise/bleed easily.  Psychiatric/Behavioral:  Negative for depression. The patient is not nervous/anxious and does not have insomnia.      MEDICAL HISTORY:  Past Medical History:  Diagnosis Date    Anxiety    Depression    GERD (gastroesophageal reflux disease)    Throat cancer (HCC) 2021   Tongue cancer (HCC)     SURGICAL HISTORY: Past Surgical History:  Procedure Laterality Date   CAROTID PTA/STENT INTERVENTION N/A 11/24/2023   Procedure: CAROTID PTA/STENT INTERVENTION;  Surgeon: Marea Selinda RAMAN, MD;  Location: [facility] INVASIVE CV LAB;  Service: Cardiovascular;  Laterality: N/A;   COLONOSCOPY WITH PROPOFOL  N/A 03/30/2020   Procedure: COLONOSCOPY WITH PROPOFOL ;  Surgeon: Janalyn Keene NOVAK, MD;  Location: [facility] ENDOSCOPY;  Service: Endoscopy;  Laterality: N/A;  COVID POSITIVE 02/29/2020   CORONARY/GRAFT ACUTE MI REVASCULARIZATION N/A 01/23/2022   Procedure: Coronary/Graft Acute MI Revascularization;  Surgeon: Mady Lonni, MD;  Location: [facility] IN[facility] CV [facility];  Service: Cardiovascular;  Laterality: N/A;   ESOPHAGOGASTRODUODENOSCOPY N/A 07/24/2023   Procedure: EGD (ESOPHAGOGASTRODUODENOSCOPY);  Surgeon: Unk Corinn Skiff, MD;  Location: [facility] ENDOSCOPY;  Service: Gastroenterology;  Laterality: N/A;   ESOPHAGOGASTRODUODENOSCOPY (EGD) WITH PROPOFOL  N/A 03/30/2020   Procedure: ESOPHAGOGASTRODUODENOSCOPY (EGD) WITH PROPOFOL ;  Surgeon: Janalyn Keene NOVAK, MD;  Location: [facility] ENDOSCOPY;  Service: Endoscopy;  Laterality: N/A;   ESOPHAGOGASTRODUODENOSCOPY (EGD) WITH PROPOFOL  N/A 08/03/2020   Procedure: ESOPHAGOGASTRODUODENOSCOPY (EGD) WITH PROPOFOL ;  Surgeon: Janalyn Keene NOVAK, MD;  Location: [facility] ENDOSCOPY;  Service: Endoscopy;  Laterality: N/A;   ESOPHAGOGASTRODUODENOSCOPY (EGD) WITH PROPOFOL  N/A 01/25/2021   Procedure: ESOPHAGOGASTRODUODENOSCOPY (EGD) WITH PROPOFOL ;  Surgeon: Janalyn Keene NOVAK, MD;  Location: [facility] ENDOSCOPY;  Service: Endoscopy;  Laterality: N/A;   ESOPHAGOGASTRODUODENOSCOPY (EGD) WITH PROPOFOL  N/A 09/05/2021   Procedure: ESOPHAGOGASTRODUODENOSCOPY (EGD) WITH PROPOFOL ;  Surgeon: Unk Corinn Skiff, MD;  Location: [facility] ENDOSCOPY;  Service: Gastroenterology;  Laterality:  N/A;   EXCISION MASS NECK Left 01/18/2019   Procedure: EXCISION MASS NECK/NODE;  Surgeon: Edda Mt, MD;  Location: [facility] ORS;  Service: ENT;  Laterality: Left;   FEMORAL ARTERY EXPLORATION Right 11/26/2023   Procedure: EXPLORATION, ARTERY, FEMORAL;  Surgeon: Marea Selinda RAMAN, MD;  Location: [facility] ORS;  Service: Vascular;  Laterality: Right;   IR REMOVAL TUN ACCESS W/ PORT W/O FL MOD SED  12/05/2021   LARYNGOSCOPY Bilateral 02/22/2019   Procedure: MICROSCOPIC DIRECT LARYNGOSCOPY AND BIOPSY;  Surgeon: Edda Mt, MD;  Location: [facility] ORS;  Service: ENT;  Laterality: Bilateral;   LEFT HEART CATH AND CORONARY ANGIOGRAPHY N/A 01/23/2022   Procedure: LEFT HEART CATH AND CORONARY ANGIOGRAPHY;  Surgeon: Mady Bruckner, MD;  Location: [facility];  Service: Cardiovascular;  Laterality: N/A;   LOWER EXTREMITY ANGIOGRAPHY Right 11/24/2023   Procedure: Lower Extremity Angiography;  Surgeon: Marea Selinda RAMAN, MD;  Location: [facility];  Service: Cardiovascular;  Laterality: Right;   PORTA CATH INSERTION N/A 03/24/2019   Procedure: PORTA CATH INSERTION;  Surgeon: Jama Cordella MATSU, MD;  Location: [facility];  Service: Cardiovascular;  Laterality: N/A;   TONSILLECTOMY     TUBAL LIGATION      SOCIAL HISTORY: Social History   Socioeconomic History   Marital status: Single    Spouse name: Not on file   Number of children: Not on file   Years of education: Not on file   Highest education level: Not on file  Occupational History   Not on file  Tobacco Use   Smoking status: Never    Passive exposure: Never   Smokeless tobacco: Never  Vaping Use   Vaping status: Never Used  Substance and Sexual Activity   Alcohol use: Not Currently   Drug use: No   Sexual activity: Not on file  Other Topics Concern   Not on file  Social History Narrative   Not on file   Social Drivers of Health   Financial Resource Strain: Not on file  Food Insecurity: No Food Insecurity  (11/24/2023)   Hunger Vital Sign    Worried About Running Out of Food in the Last Year: Never true    Ran Out of Food in the Last Year: Never true  Transportation Needs: No Transportation Needs (11/24/2023)   PRAPARE - Administrator, Civil Service (Medical): No    Lack of Transportation (Non-Medical): No  Physical Activity: Not on file  Stress: Not on file  Social Connections: Not on file  Intimate Partner Violence: Not At Risk (11/24/2023)   Humiliation, Afraid, Rape, and Kick questionnaire    Fear of Current or Ex-Partner: No    Emotionally Abused: No    Physically Abused: No    Sexually Abused: No   FAMILY HISTORY: Family History  Problem Relation Age of Onset   Breast cancer Sister    Brain cancer Paternal Grandfather    ALLERGIES:  is allergic to chlorhexidine .  MEDICATIONS:  Current Outpatient Medications  Medication Sig Dispense Refill   acetaminophen -codeine (TYLENOL  #3) 300-30 MG tablet Take 1 tablet by mouth every 6 (six) hours as needed.     ALPRAZolam  (XANAX ) 1 MG tablet Take 1 tablet (1 mg total) by mouth 2 (two) times daily as needed for anxiety. 60 tablet 2   aspirin  81 MG chewable tablet Chew 1 tablet (81 mg total) by mouth daily. 30 tablet 1   atorvastatin  (LIPITOR ) 80 MG tablet TAKE 1 TABLET BY MOUTH EVERY DAY 90 tablet 1   BRILINTA  90 MG TABS tablet TAKE 1 TABLET BY MOUTH 2 TIMES DAILY. 180  tablet 3   Docusate Sodium  (DSS) 100 MG CAPS Take 1 capsule by mouth every other day.     gabapentin  (NEURONTIN ) 300 MG capsule Take 1 capsule (300 mg total) by mouth 2 (two) times daily. 60 capsule 5   HYDROcodone -acetaminophen  (NORCO/VICODIN) 5-325 MG tablet Take 1 tablet by mouth every 6 (six) hours as needed.     HYDROcodone -acetaminophen  (NORCO/VICODIN) 5-325 MG tablet Take 1-2 tablets by mouth every 6 (six) hours as needed for moderate pain (pain score 4-6). 30 tablet 0   isosorbide  mononitrate (IMDUR ) 30 MG 24 hr tablet TAKE 1/2 OF A TABLET (15 MG TOTAL)  BY MOUTH DAILY 45 tablet 3   meclizine  (ANTIVERT ) 12.5 MG tablet Take 1 tablet (12.5 mg total) by mouth 3 (three) times daily as needed for dizziness or nausea. 30 tablet 0   metFORMIN  (GLUCOPHAGE -XR) 500 MG 24 hr tablet Take 1 tablet (500 mg total) by mouth daily with breakfast. 30 tablet 3   metoprolol  succinate (TOPROL -XL) 25 MG 24 hr tablet TAKE 0.5 TABLETS BY MOUTH DAILY. TAKE WITH OR IMMEDIATELY FOLLOWING A MEAL. 45 tablet 3   sertraline  (ZOLOFT ) 50 MG tablet Take 1 tablet (50 mg total) by mouth daily. 30 tablet 3   Vitamin D , Ergocalciferol , (DRISDOL ) 1.25 MG (50000 UNIT) CAPS capsule Take 50,000 Units by mouth once a week.     methocarbamol (ROBAXIN) 500 MG tablet Take 500 mg by mouth at bedtime. (Patient not taking: Reported on 12/04/2023)     traMADol  (ULTRAM ) 50 MG tablet Take 50 mg by mouth every 6 (six) hours as needed. (Patient not taking: Reported on 12/04/2023)     No current facility-administered medications for this visit.    PHYSICAL EXAMINATION: Vitals:   12/04/23 1357  BP: 121/82  Pulse: 94  Resp: 16  Temp: 98.4 F (36.9 C)  SpO2: 97%   Filed Weights   12/04/23 1357  Weight: 177 lb (80.3 kg)   Physical Exam Vitals reviewed.  Constitutional:      Appearance: She is not ill-appearing.  Eyes:     General: No scleral icterus.    Conjunctiva/sclera: Conjunctivae normal.  Cardiovascular:     Rate and Rhythm: Normal rate and regular rhythm.  Abdominal:     General: There is no distension.     Palpations: Abdomen is soft.     Tenderness: There is no abdominal tenderness. There is no guarding.  Musculoskeletal:        General: No deformity.     Cervical back: Neck supple. No rigidity.     Right lower leg: No edema.     Left lower leg: No edema.  Lymphadenopathy:     Cervical: No cervical adenopathy.  Skin:    General: Skin is warm and dry.  Neurological:     Mental Status: She is alert and oriented to person, place, and time.  Psychiatric:        Mood  and Affect: Mood normal.        Behavior: Behavior normal.    LABORATORY DATA:  I have reviewed the data as listed Lab Results  Component Value Date   WBC 7.1 12/04/2023   HGB 10.0 (L) 12/04/2023   HCT 31.4 (L) 12/04/2023   MCV 90.8 12/04/2023   PLT 349 12/04/2023   Recent Labs    11/06/23 1151 11/19/23 2003 11/24/23 0850 11/26/23 0410 11/27/23 0625 12/04/23 1342  NA 145* 139   < > 140 138 137  K 4.2 3.3*   < >  3.3* 4.3 3.9  CL 107* 105   < > 111 108 103  CO2 22 19*   < > 25 22 23   GLUCOSE 94 143*   < > 107* 139* 135*  BUN 14 13   < > 9 13 13   CREATININE 1.01* 0.99   < > 0.72 0.93 1.11*  CALCIUM  9.9 9.3   < > 8.2* 9.0 9.4  GFRNONAA  --  >60   < > >60 >60 56*  PROT 6.7 7.3  --   --   --  7.5  ALBUMIN 4.5 4.1  --   --   --  4.0  AST 21 35  --   --   --  23  ALT 15 18  --   --   --  13  ALKPHOS 123 89  --   --   --  90  BILITOT 0.6 0.9  --   --   --  1.6*   < > = values in this interval not displayed.   Lab Results  Component Value Date   VITAMINB12 793 12/04/2023   Lab Results  Component Value Date   TSH 5.510 (H) 11/06/2023    RADIOGRAPHIC STUDIES: I have personally reviewed the radiological images as listed and agreed with the findings in the report. PERIPHERAL VASCULAR CATHETERIZATION Result Date: 11/25/2023 See surgical note for result.  CT ABDOMEN PELVIS W CONTRAST Result Date: 11/24/2023 CLINICAL DATA:  Retroperitoneal bleed suspected hematoma EXAM: CT ABDOMEN AND PELVIS WITH CONTRAST TECHNIQUE: Multidetector CT imaging of the abdomen and pelvis was performed using the standard protocol following bolus administration of intravenous contrast. RADIATION DOSE REDUCTION: This exam was performed according to the departmental dose-optimization program which includes automated exposure control, adjustment of the mA and/or kV according to patient size and/or use of iterative reconstruction technique. CONTRAST:  OMNIPAQUE  IOHEXOL  300 MG/ML  SOLN COMPARISON:   05/11/2020 FINDINGS: Lower chest: No focal airspace consolidation or pleural effusion.Multi-vessel coronary atherosclerosis. Hepatobiliary: No mass.Focal fatty infiltration along the falciform ligament.No radiopaque stones or wall thickening of the gallbladder. No intrahepatic or extrahepatic biliary ductal dilation. The portal veins are patent. Pancreas: No mass or main ductal dilation. No peripancreatic inflammation or fluid collection. Spleen: Normal size. No mass. Adrenals/Urinary Tract: No adrenal masses. No renal mass. No hydronephrosis. Excreted contrast would obscure nonobstructive renal calculi. Excreted contrast within the urinary bladder. No bladder wall thickening. Stomach/Bowel: Small hiatal hernia. Fluid in the distal esophagus. The stomach contains ingested material without focal abnormality. No small bowel wall thickening or inflammation. No small bowel obstruction.Normal appendix. Descending and sigmoid colonic diverticulosis. No changes of acute diverticulitis. Vascular/Lymphatic: No aortic aneurysm. Scattered aortoiliac atherosclerosis. No intraabdominal or pelvic lymphadenopathy. Reproductive: Age-related atrophy of the uterus and ovaries. No concerning adnexal mass.No free pelvic fluid. Other: No pneumoperitoneum, ascites, or mesenteric inflammation. No retroperitoneal hematoma. Musculoskeletal: No acute fracture or destructive lesion. Multilevel degenerative disc disease of the spine. Moderate amount of hemorrhage within the right inguinal region and proximal right thigh. There is a long neck pseudoaneurysm arising from the right proximal superficial femoral artery, measuring 11 x 8 mm with a 3.2 cm long neck, the neck measures 4 mm wide. IMPRESSION: 1. Moderate amount of hemorrhage within the subcutaneous tissues of the right inguinal region and proximal right thigh. There is a long, but narrow neck pseudoaneurysm arising from the proximal right superficial femoral artery, as measured above.  No retroperitoneal hematoma. 2. Small hiatal hernia. Fluid in the distal esophagus, which  may reflect gastroesophageal reflux. 3. Descending and sigmoid colonic diverticulosis. No changes of acute diverticulitis. Critical Value/emergent results were called by telephone at the time of interpretation on 11/24/2023 at 3:11 pm to provider SELINDA GU , who verbally acknowledged these results. Aortic Atherosclerosis (ICD10-I70.0). Electronically Signed   By: Rogelia Myers M.D.   On: 11/24/2023 15:12   PERIPHERAL VASCULAR CATHETERIZATION Result Date: 11/24/2023 See surgical note for result.  VAS US  CAROTID Result Date: 11/18/2023 Carotid Arterial Duplex Study Patient Name:  CHRISHELLE ZITO  Date of Exam:   11/14/2023 Medical Rec #: [id number]        Accession #:    [id number] Date of Birth: 04/23/1960         Patient Gender: F Patient Age:   68 years Exam Location:  [facility] Procedure:      VAS US  CAROTID Referring Phys: ORVIN DARING --------------------------------------------------------------------------------  Indications:       Carotid artery disease. Comparison Study:  05/29/2022: In comparison to prior study the Left ICA has                    increased Velocities. Performing Technologist: Leafy Gibes RVS  Examination Guidelines: A complete evaluation includes B-mode imaging, spectral Doppler, color Doppler, and power Doppler as needed of all accessible portions of each vessel. Bilateral testing is considered an integral part of a complete examination. Limited examinations for reoccurring indications may be performed as noted.  Right Carotid Findings: +----------+--------+--------+--------+------------------+--------+           PSV cm/sEDV cm/sStenosisPlaque DescriptionComments +----------+--------+--------+--------+------------------+--------+ CCA Prox  90      20                                         +----------+--------+--------+--------+------------------+--------+ CCA  Mid   83      24                                         +----------+--------+--------+--------+------------------+--------+ CCA Distal84      28                                         +----------+--------+--------+--------+------------------+--------+ ICA Prox  70      21                                         +----------+--------+--------+--------+------------------+--------+ ICA Mid   84      35                                         +----------+--------+--------+--------+------------------+--------+ ICA Distal86      35                                         +----------+--------+--------+--------+------------------+--------+ ECA       59      12                                         +----------+--------+--------+--------+------------------+--------+ +----------+--------+-------+--------+-------------------+  PSV cm/sEDV cmsDescribeArm Pressure (mmHG) +----------+--------+-------+--------+-------------------+ [id number]      0                                  +----------+--------+-------+--------+-------------------+ +---------+--------+--+--------+--+ VertebralPSV cm/s43EDV cm/s14 +---------+--------+--+--------+--+  Left Carotid Findings: +----------+--------+--------+--------+------------------+--------+           PSV cm/sEDV cm/sStenosisPlaque DescriptionComments +----------+--------+--------+--------+------------------+--------+ CCA Prox  69      20                                         +----------+--------+--------+--------+------------------+--------+ CCA Mid   81      28                                         +----------+--------+--------+--------+------------------+--------+ CCA Distal77      29                                         +----------+--------+--------+--------+------------------+--------+ ICA Prox  72      29                                          +----------+--------+--------+--------+------------------+--------+ ICA Mid   154     58                                         +----------+--------+--------+--------+------------------+--------+ ICA Distal127     37                                         +----------+--------+--------+--------+------------------+--------+ ECA       65      12                                         +----------+--------+--------+--------+------------------+--------+ +----------+--------+--------+--------+-------------------+           PSV cm/sEDV cm/sDescribeArm Pressure (mmHG) +----------+--------+--------+--------+-------------------+ [id number]     0                                   +----------+--------+--------+--------+-------------------+ +---------+--------+--+--------+--+ VertebralPSV cm/s43EDV cm/s16 +---------+--------+--+--------+--+   Summary: Right Carotid: Velocities in the right ICA are consistent with a 1-39% stenosis. Left Carotid: Velocities in the left ICA are consistent with a 40-59% stenosis. Vertebrals:  Bilateral vertebral arteries demonstrate antegrade flow. Subclavians: Normal flow hemodynamics were seen in bilateral subclavian              arteries. *See table(s) above for measurements and observations.  Electronically signed by Selinda Gu MD on 11/18/2023 at 9:09:56 AM.    Final    MR Brain W Wo Contrast Result Date: 11/05/2023 CLINICAL DATA:  Provided history: Syncope and collapse. Syncope/presyncope, cerebrovascular cause suspected. EXAM:  MRI HEAD WITHOUT AND WITH CONTRAST TECHNIQUE: Multiplanar, multiecho pulse sequences of the brain and surrounding structures were obtained without and with intravenous contrast. CONTRAST:  8mL GADAVIST  GADOBUTROL  1 MMOL/ML IV SOLN COMPARISON:  MRI brain and MRA head 06/26/2020. FINDINGS: Brain: No age-advanced or lobar predominant cerebral atrophy. Multifocal T2 FLAIR hyperintense signal abnormality within the cerebral white  matter, nonspecific but compatible with mild chronic small vessel ischemic disease. No cortical encephalomalacia is identified. There is no acute infarct. No evidence of an intracranial mass. No chronic intracranial blood products. No extra-axial fluid collection. No midline shift. No pathologic intracranial enhancement identified. Vascular: Maintained flow voids within the proximal large arterial vessels. Skull and upper cervical spine: No focal worrisome marrow lesion. Sinuses/Orbits: No mass or acute finding within the imaged orbits. Trace mucosal thickening within the right maxillary sinus. Tiny mucous retention cyst within the left maxillary sinus. IMPRESSION: 1. No evidence of an acute intracranial abnormality. 2. Mild chronic small vessel ischemic changes within the cerebral white matter, similar to the prior MRI of 06/26/2020. 3. Minor paranasal sinus disease. Electronically Signed   By: Rockey Childs D.O.   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 Continue follow up with ENT, Dr Juengel. Clinically, NED. Asymptomatic. Continue surveillance. Would recommend imaging if concerning symptoms.    # Mild hypothyroidism: TSH 5.4. Follow up with pcp.    # Normocytic anemia- Hmg intermittently in upper 11 range. EGD colonoscopy- February 2022 [Dr.Tahiliani]- Hmg today 10. Likely related to recent blood loss. Monitor.    # Elevated BG- PBF- BG 135. Prediabetes. Follow up with PCP.   # Carotid artery stenosis- s/p stent placement, complicated by bleeding requiring hematoma evacuation on 11/26/23.    # History of B12 deficiency- Today, 793, Continue monthly b12 injections.     # Port/IV access- s/p port explantation- PIV    # DISPOSITION:  # B12 injection today # B12 injections monthly x6  # follow up in 6 months- lab (cbc/cmp/thyroid  profile/B12 levels), Dr Rennie, +/-  B12- la  No problem-specific Assessment & Plan notes found for this encounter.  All questions were answered. The patient knows to call the clinic with any problems, questions or concerns.   Tinnie KANDICE Dawn, NP 12/04/2023

## 2023-12-04 NOTE — Telephone Encounter (Signed)
 Called [facility] in reference to patient, informed them I was sending over a RX for home health nurse for patient due to [facility] PTA being concerned about patients incision site. RX and post-op note have been faxed over at this time to [phone].

## 2023-12-04 NOTE — Telephone Encounter (Signed)
 [facility] PTA called in reference to patient, he was doing a home health physical therapy visit and noted patients R interior hip incision is red, has bloody drainage at this time, and  all staples are still intact. He noted she has a follow up in office 12/24/23. He felt as if it needs to be looked at either in office or he wanted to get an order for home health nurse to evaluate at this time. Please advise.

## 2023-12-04 NOTE — Telephone Encounter (Signed)
 WE can get [facility] nurse to evaluate.  Also reach out to the patient to see if she's able to send us  a pic via mychart

## 2023-12-05 ENCOUNTER — Other Ambulatory Visit: Payer: Self-pay | Admitting: Family Medicine

## 2023-12-05 DIAGNOSIS — F419 Anxiety disorder, unspecified: Secondary | ICD-10-CM

## 2023-12-05 LAB — THYROID PANEL WITH TSH
Free Thyroxine Index: 1.5 (ref 1.2–4.9)
T3 Uptake Ratio: 21 % — ABNORMAL LOW (ref 24–39)
T4, Total: 7.1 ug/dL (ref 4.5–12.0)
TSH: 5.42 u[IU]/mL — ABNORMAL HIGH (ref 0.450–4.500)

## 2023-12-05 NOTE — Telephone Encounter (Signed)
 Patient left vm on nurse line stating shed like a call back.

## 2023-12-05 NOTE — Telephone Encounter (Signed)
 Spoke with patient at this time and instructed her how to upload a picture into mychart step to step so we can get it over to the provider. Patient verbalized understanding and she would call back if she had any questions.

## 2023-12-05 NOTE — Telephone Encounter (Signed)
 Tried to call patient at this time in reference to uploading a picture of her hip to her mychart and patient has a voicemail box that is not set up at this time, unable to leave message for patient. Will try back this afternoon.

## 2023-12-05 NOTE — Telephone Encounter (Signed)
 Left detailed VM authorizing PT order request per Dr. Ziglar.

## 2023-12-07 DIAGNOSIS — R55 Syncope and collapse: Secondary | ICD-10-CM | POA: Diagnosis not present

## 2023-12-09 ENCOUNTER — Other Ambulatory Visit: Payer: Self-pay | Admitting: Physician Assistant

## 2023-12-11 ENCOUNTER — Encounter

## 2023-12-11 MED ORDER — TICAGRELOR 90 MG PO TABS: 90.0000 mg | ORAL_TABLET | Freq: Two times a day (BID) | ORAL | 3 refills | Status: DC

## 2023-12-15 ENCOUNTER — Telehealth (INDEPENDENT_AMBULATORY_CARE_PROVIDER_SITE_OTHER): Payer: Self-pay

## 2023-12-15 NOTE — Telephone Encounter (Signed)
 Where is the pain (inner thigh or outer thigh)? Does it happen when she walks or is it present all the time? Can she describe the pain

## 2023-12-15 NOTE — Telephone Encounter (Signed)
 Let's bring her in a little earlier for her studies

## 2023-12-15 NOTE — Telephone Encounter (Signed)
 The patient stated that pain is constant and the leg is sensitive to touch. Also she is having some swelling in the thigh area but she was advised by her physical therapist to use cold compresses

## 2023-12-15 NOTE — Telephone Encounter (Signed)
 Mark with [facility] left a message stating that the patient is experiencing pain soreness between the right hip and knee. The patient has been experiencing pain for past couple days. The pain level was a 10/10 on yesterday and today the pain is  8 out 10. Patient had femoral artery exploration on 11/26/23 and has upcoming appt 12/24/23. Please Advise

## 2023-12-16 ENCOUNTER — Other Ambulatory Visit (INDEPENDENT_AMBULATORY_CARE_PROVIDER_SITE_OTHER): Payer: Self-pay | Admitting: Vascular Surgery

## 2023-12-16 DIAGNOSIS — I724 Aneurysm of artery of lower extremity: Secondary | ICD-10-CM

## 2023-12-16 DIAGNOSIS — I6523 Occlusion and stenosis of bilateral carotid arteries: Secondary | ICD-10-CM

## 2023-12-17 ENCOUNTER — Ambulatory Visit (INDEPENDENT_AMBULATORY_CARE_PROVIDER_SITE_OTHER)

## 2023-12-17 ENCOUNTER — Other Ambulatory Visit (INDEPENDENT_AMBULATORY_CARE_PROVIDER_SITE_OTHER)

## 2023-12-17 DIAGNOSIS — T81718A Complication of other artery following a procedure, not elsewhere classified, initial encounter: Secondary | ICD-10-CM

## 2023-12-17 DIAGNOSIS — I6523 Occlusion and stenosis of bilateral carotid arteries: Secondary | ICD-10-CM | POA: Diagnosis not present

## 2023-12-17 DIAGNOSIS — I724 Aneurysm of artery of lower extremity: Secondary | ICD-10-CM | POA: Diagnosis not present

## 2023-12-23 ENCOUNTER — Ambulatory Visit

## 2023-12-24 ENCOUNTER — Encounter (INDEPENDENT_AMBULATORY_CARE_PROVIDER_SITE_OTHER)

## 2023-12-24 ENCOUNTER — Encounter (INDEPENDENT_AMBULATORY_CARE_PROVIDER_SITE_OTHER): Payer: Self-pay | Admitting: Nurse Practitioner

## 2023-12-24 ENCOUNTER — Ambulatory Visit (INDEPENDENT_AMBULATORY_CARE_PROVIDER_SITE_OTHER): Admitting: Nurse Practitioner

## 2023-12-24 VITALS — BP 121/82 | HR 78 | Resp 18 | Ht 62.0 in | Wt 173.0 lb

## 2023-12-24 DIAGNOSIS — I6523 Occlusion and stenosis of bilateral carotid arteries: Secondary | ICD-10-CM

## 2023-12-24 DIAGNOSIS — I724 Aneurysm of artery of lower extremity: Secondary | ICD-10-CM

## 2023-12-24 DIAGNOSIS — T81718A Complication of other artery following a procedure, not elsewhere classified, initial encounter: Secondary | ICD-10-CM

## 2023-12-24 MED ORDER — HYDROCODONE-ACETAMINOPHEN 5-325 MG PO TABS: 1.0000 | ORAL_TABLET | Freq: Four times a day (QID) | ORAL | 0 refills | Status: DC | PRN

## 2023-12-28 ENCOUNTER — Encounter (INDEPENDENT_AMBULATORY_CARE_PROVIDER_SITE_OTHER): Payer: Self-pay | Admitting: Nurse Practitioner

## 2023-12-28 NOTE — Progress Notes (Signed)
 SUBJECTIVE:  Patient ID: Connie Cameron, female    DOB: 1960-10-05, 63 y.o.   MRN: [id number] Chief Complaint  Patient presents with   Follow-up    US  done 12/17/23 post op follow up with carotid/arterial duplex    Discussed the use of AI scribe software for clinical note transcription with the patient, who gave verbal consent to proceed.  History of Present Illness Connie Cameron is a 63 year old female with a history of carotid stenting and that resulted in  pseudoaneurysm who presents with groin wound complications and pain.  She has complications from a previous carotid stenting procedure, resulting in a pseudoaneurysm and subsequent surgical intervention. She experiences ongoing issues with a groin wound that has not healed properly, characterized by an open wound with yellowish fibrinous exudate. She changes the dressing three times daily and experiences significant tenderness and pain, particularly on the inside of her leg, rated as 10 out of 10 in severity.  She experienced a recent episode of hypotension, with blood pressure dropping to 85/64 mmHg, causing lightheadedness. This episode resolved after a couple of hours. She has a history of syncope episodes, which have ceased, but she remains concerned about blood pressure fluctuations.   She reports numbness on the outside of her leg and significant tenderness on the inside, severe enough to cause tears. She is unable to perform exercises due to the pain.  Her current medications include Imdur , but she is not taking any pain medications at present. She has a history of radiation on the left side of her neck, relevant to her carotid stenting history.     Results RADIOLOGY Carotid Ultrasound: Left stent wide open, no significant stenosis; right 1-39% stenosis, minimal. Groin Ultrasound: No pseudoaneurysm detected.  PROCEDURE NOTES Procedure: Staple removal Description: Staples removed from incision site. Steri strips applied  for reinforcement.  Procedure: Wound probing Description: Probed wound to assess for tunneling. Minimal tunneling observed.  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[facility])     Past Surgical History:  Procedure Laterality Date   CAROTID PTA/STENT INTERVENTION N/A 11/24/2023   Procedure: CAROTID PTA/STENT INTERVENTION;  Surgeon: Marea Selinda RAMAN, MD;  Location: [facility];  Service: Cardiovascular;  Laterality: N/A;   COLONOSCOPY WITH PROPOFOL  N/A 03/30/2020   Procedure: COLONOSCOPY WITH PROPOFOL ;  Surgeon: Janalyn Keene NOVAK, MD;  Location: [facility] ENDOSCOPY;  Service: Endoscopy;  Laterality: N/A;  COVID POSITIVE 02/29/2020   CORONARY/GRAFT ACUTE MI REVASCULARIZATION N/A 01/23/2022   Procedure: Coronary/Graft Acute MI Revascularization;  Surgeon: Mady Bruckner, MD;  Location: [facility];  Service: Cardiovascular;  Laterality: N/A;   ESOPHAGOGASTRODUODENOSCOPY N/A 07/24/2023   Procedure: EGD (ESOPHAGOGASTRODUODENOSCOPY);  Surgeon: Unk Corinn Skiff, MD;  Location: [facility] ENDOSCOPY;  Service: Gastroenterology;  Laterality: N/A;   ESOPHAGOGASTRODUODENOSCOPY (EGD) WITH PROPOFOL  N/A 03/30/2020   Procedure: ESOPHAGOGASTRODUODENOSCOPY (EGD) WITH PROPOFOL ;  Surgeon: Janalyn Keene NOVAK, MD;  Location: [facility];  Service: Endoscopy;  Laterality: N/A;   ESOPHAGOGASTRODUODENOSCOPY (EGD) WITH PROPOFOL  N/A 08/03/2020   Procedure: ESOPHAGOGASTRODUODENOSCOPY (EGD) WITH PROPOFOL ;  Surgeon: Janalyn Keene NOVAK, MD;  Location: [facility];  Service: Endoscopy;  Laterality: N/A;   ESOPHAGOGASTRODUODENOSCOPY (EGD) WITH PROPOFOL  N/A 01/25/2021   Procedure: ESOPHAGOGASTRODUODENOSCOPY (EGD) WITH PROPOFOL ;  Surgeon: Janalyn Keene NOVAK, MD;  Location: [facility];  Service: Endoscopy;  Laterality: N/A;   ESOPHAGOGASTRODUODENOSCOPY (EGD) WITH PROPOFOL  N/A 09/05/2021  Procedure:  ESOPHAGOGASTRODUODENOSCOPY (EGD) WITH PROPOFOL ;  Surgeon: Unk Corinn Skiff, MD;  Location: [facility] ENDOSCOPY;  Service: Gastroenterology;  Laterality: N/A;   EXCISION MASS NECK Left 01/18/2019   Procedure: EXCISION MASS NECK/NODE;  Surgeon: Edda Mt, MD;  Location: [facility] ORS;  Service: ENT;  Laterality: Left;   FEMORAL ARTERY EXPLORATION Right 11/26/2023   Procedure: EXPLORATION, ARTERY, FEMORAL;  Surgeon: Marea Selinda RAMAN, MD;  Location: [facility] OR[facility];  Service: Vascular;  Laterality: Right;   IR REMOVAL TUN ACCESS W/ PORT W/O FL MOD SED  12/05/2021   LARYNGOSCOPY Bilateral 02/22/2019   Procedure: MICROSCOPIC DIRECT LARYNGOSCOPY AND BIOPSY;  Surgeon: Edda Mt, MD;  Location: [facility] OR[facility];  Service: ENT;  Laterality: Bilateral;   LEFT HEART CATH AND CORONARY ANGIOGRAPHY N/A 01/23/2022   Procedure: LEFT HEART CATH AND CORONARY ANGIOGRAPHY;  Surgeon: Mady Bruckner, MD;  Location: [facility];  Service: Cardiovascular;  Laterality: N/A;   LOWER EXTREMITY ANGIOGRAPHY Right 11/24/2023   Procedure: Lower Extremity Angiography;  Surgeon: Marea Selinda RAMAN, MD;  Location: [facility];  Service: Cardiovascular;  Laterality: Right;   PORTA CATH INSERTION N/A 03/24/2019   Procedure: PORTA CATH INSERTION;  Surgeon: Jama Cordella MATSU, MD;  Location: [facility];  Service: Cardiovascular;  Laterality: N/A;   TONSILLECTOMY     TUBAL LIGATION      Social History   Socioeconomic History   Marital status: Single    Spouse name: Not on file   Number of children: Not on file   Years of education: Not on file   Highest education level: Not on file  Occupational History   Not on file  Tobacco Use   Smoking status: Never    Passive exposure: Never   Smokeless tobacco: Never  Vaping Use   Vaping status: Never Used  Substance and Sexual Activity   Alcohol use: Not Currently   Drug use: No   Sexual activity: Not on file  Other Topics Concern   Not on file  Social History Narrative    Not on file   Social Drivers of Health   Financial Resource Strain: Not on file  Food Insecurity: No Food Insecurity (11/24/2023)   Hunger Vital Sign    Worried About Running Out of Food in the Last Year: Never true    Ran Out of Food in the Last Year: Never true  Transportation Needs: No Transportation Needs (11/24/2023)   PRAPARE - Administrator, Civil Service (Medical): No    Lack of Transportation (Non-Medical): No  Physical Activity: Not on file  Stress: Not on file  Social Connections: Not on file  Intimate Partner Violence: Not At Risk (11/24/2023)   Humiliation, Afraid, Rape, and Kick questionnaire    Fear of Current or Ex-Partner: No    Emotionally Abused: No    Physically Abused: No    Sexually Abused: No    Family History  Problem Relation Age of Onset   Heart disease Mother    Cancer Mother    Breast cancer Sister    Brain cancer Paternal Grandfather     Allergies  Allergen Reactions   Chlorhexidine      Pt doesn't have anything that requires CHG bath     Review of Systems   Review of Systems: Negative Unless Checked Constitutional: [] Weight loss  [] Fever  [] Chills Cardiac: [] Chest pain   []  Atrial Fibrillation  [] Palpitations   [] Shortness of breath when laying flat   [] Shortness of breath with exertion. []   Shortness of breath at rest Vascular:  [] Pain in legs with walking   [] Pain in legs with standing [] Pain in legs when laying flat   [] Claudication    [] Pain in feet when laying flat    [] History of DVT   [] Phlebitis   [] Swelling in legs   [] Varicose veins   [] Non-healing ulcers Pulmonary:   [] Uses home oxygen   [] Productive cough   [] Hemoptysis   [] Wheeze  [] COPD   [] Asthma Neurologic:  [] Dizziness   [] Seizures  [] Blackouts [] History of stroke   [] History of TIA  [] Aphasia   [] Temporary Blindness   [] Weakness or numbness in arm   [] Weakness or numbness in leg Musculoskeletal:   [] Joint swelling   [] Joint pain   [] Low back pain  []  History of  Knee Replacement [] Arthritis [] back Surgeries  []  Spinal Stenosis    Hematologic:  [] Easy bruising  [] Easy bleeding   [] Hypercoagulable state   [] Anemic Gastrointestinal:  [] Diarrhea   [] Vomiting  [] Gastroesophageal reflux/heartburn   [] Difficulty swallowing. [] Abdominal pain Genitourinary:  [] Chronic kidney disease   [] Difficult urination  [] Anuric   [] Blood in urine [] Frequent urination  [] Burning with urination   [] Hematuria Skin:  [] Rashes   [] Ulcers [x] Wounds Psychological:  [] History of anxiety   []  History of major depression  []  Memory Difficulties      OBJECTIVE:     BP 121/82 (BP Location: Left Arm, Patient Position: Sitting, Cuff Size: Normal)   Pulse 78   Resp 18   Ht 5' 2 (1.575 m)   Wt 173 lb (78.5 kg)   BMI 31.64 kg/m   Physical Exam Vitals reviewed.  HENT:     Head: Normocephalic.  Cardiovascular:     Rate and Rhythm: Normal rate.  Pulmonary:     Effort: Pulmonary effort is normal.  Skin:    General: Skin is warm and dry.  Neurological:     Mental Status: She is alert and oriented to person, place, and time.  Psychiatric:        Mood and Affect: Mood normal.        Behavior: Behavior normal.        Thought Content: Thought content normal.        Judgment: Judgment normal.    Physical Exam CARDIOVASCULAR: Left carotid stent patent, no significant stenosis, right 1-39% stenosis. GENITOURINARY: Proximal incision with fibrinous exudate, no pseudoaneurysm, slight tunneling, tender.   CMP     Component Value Date/Time   NA 137 12/04/2023 1342   NA 145 (H) 11/06/2023 1151   NA 140 06/24/2011 1709   K 3.9 12/04/2023 1342   K 4.3 06/24/2011 1709   CL 103 12/04/2023 1342   CL 105 06/24/2011 1709   CO2 23 12/04/2023 1342   CO2 30 06/24/2011 1709   GLUCOSE 135 (H) 12/04/2023 1342   GLUCOSE 101 (H) 06/24/2011 1709   BUN 13 12/04/2023 1342   BUN 14 11/06/2023 1151   BUN 17 06/24/2011 1709   CREATININE 1.11 (H) 12/04/2023 1342   CREATININE 0.91  06/24/2011 1709   CALCIUM  9.4 12/04/2023 1342   CALCIUM  9.4 06/24/2011 1709   PROT 7.5 12/04/2023 1342   PROT 6.7 11/06/2023 1151   PROT 8.4 (H) 06/24/2011 1709   ALBUMIN 4.0 12/04/2023 1342   ALBUMIN 4.5 11/06/2023 1151   ALBUMIN 4.1 06/24/2011 1709   AST 23 12/04/2023 1342   ALT 13 12/04/2023 1342   ALT 18 06/24/2011 1709   ALKPHOS 90 12/04/2023 1342   ALKPHOS 99 06/24/2011  1709   BILITOT 1.6 (H) 12/04/2023 1342   EGFR 63 11/06/2023 1151   GFRNONAA 56 (L) 12/04/2023 1342   GFRNONAA >60 06/24/2011 1709    No results found.     ASSESSMENT AND PLAN:  1. Bilateral carotid artery stenosis (Primary) Recommend:  The patient is s/p successful left carotid stent  Duplex ultrasound  shows <40%  stenosis.  Continue dual antiplatelet therapy as prescribed for a minimum of 6 months Continue management of CAD, HTN and Hyperlipidemia Healthy heart diet,  encouraged exercise at least 4 times per week  The patient's NIHSS score is as follows: 0 Mild: 1 - 5 Mild to Moderately Severe: 5 - 14 Severe: 15 - 24 Very Severe: >25 2. Pseudoaneurysm of femoral artery following procedure Postoperative wound dehiscence with fibrinous exudate, no significant infection. Neuropathic pain and numbness likely due to nerve involvement. Tunneling present. Pain level 10/10. - Obtained wound culture to rule out infection. - Apply wet to dry dressing twice daily until Santal ointment is available. - Submit prescription for Santal ointment to special pharmacy for cost management. - Apply Santal ointment daily once available, using a nickel thickness. - Encourage walking to aid healing. - Scheduled follow-up in two weeks to assess wound healing and check blood flow to the legs.      Current Outpatient Medications on File Prior to Visit  Medication Sig Dispense Refill   ALPRAZolam  (XANAX ) 1 MG tablet Take 1 tablet (1 mg total) by mouth 2 (two) times daily as needed for anxiety. 60 tablet 2   aspirin   81 MG chewable tablet Chew 1 tablet (81 mg total) by mouth daily. 30 tablet 1   atorvastatin  (LIPITOR ) 80 MG tablet TAKE 1 TABLET BY MOUTH EVERY DAY 90 tablet 1   Docusate Sodium  (DSS) 100 MG CAPS Take 1 capsule by mouth every other day.     gabapentin  (NEURONTIN ) 300 MG capsule Take 1 capsule (300 mg total) by mouth 2 (two) times daily. 60 capsule 5   isosorbide  mononitrate (IMDUR ) 30 MG 24 hr tablet TAKE 1/2 OF A TABLET (15 MG TOTAL) BY MOUTH DAILY 45 tablet 3   meclizine  (ANTIVERT ) 12.5 MG tablet Take 1 tablet (12.5 mg total) by mouth 3 (three) times daily as needed for dizziness or nausea. 30 tablet 0   metFORMIN  (GLUCOPHAGE -XR) 500 MG 24 hr tablet Take 1 tablet (500 mg total) by mouth daily with breakfast. 30 tablet 3   metoprolol  succinate (TOPROL -XL) 25 MG 24 hr tablet TAKE 0.5 TABLETS BY MOUTH DAILY. TAKE WITH OR IMMEDIATELY FOLLOWING A MEAL. 45 tablet 3   sertraline  (ZOLOFT ) 50 MG tablet Take 1 tablet (50 mg total) by mouth daily. 30 tablet 3   ticagrelor  (BRILINTA ) 90 MG TABS tablet Take 1 tablet (90 mg total) by mouth 2 (two) times daily. 180 tablet 3   Vitamin D , Ergocalciferol , (DRISDOL ) 1.25 MG (50000 UNIT) CAPS capsule Take 50,000 Units by mouth once a week.     methocarbamol (ROBAXIN) 500 MG tablet Take 500 mg by mouth at bedtime. (Patient not taking: Reported on 12/24/2023)     No current facility-administered medications on file prior to visit.    There are no Patient Instructions on file for this visit. No follow-ups on file.   Braya Habermehl E Rolinda Impson, NP  This note was completed with Office Manager.  Any errors are purely unintentional.

## 2023-12-31 ENCOUNTER — Other Ambulatory Visit (INDEPENDENT_AMBULATORY_CARE_PROVIDER_SITE_OTHER): Payer: Self-pay | Admitting: Nurse Practitioner

## 2023-12-31 ENCOUNTER — Encounter: Payer: Self-pay | Admitting: Oncology

## 2023-12-31 ENCOUNTER — Ambulatory Visit: Admitting: Physician Assistant

## 2023-12-31 DIAGNOSIS — I739 Peripheral vascular disease, unspecified: Secondary | ICD-10-CM

## 2024-01-05 ENCOUNTER — Inpatient Hospital Stay: Attending: Internal Medicine

## 2024-01-05 DIAGNOSIS — D701 Agranulocytosis secondary to cancer chemotherapy: Secondary | ICD-10-CM

## 2024-01-05 DIAGNOSIS — Z79899 Other long term (current) drug therapy: Secondary | ICD-10-CM | POA: Diagnosis not present

## 2024-01-05 DIAGNOSIS — E538 Deficiency of other specified B group vitamins: Secondary | ICD-10-CM | POA: Insufficient documentation

## 2024-01-05 MED ORDER — CYANOCOBALAMIN 1000 MCG/ML IJ SOLN
1000.0000 ug | Freq: Once | INTRAMUSCULAR | Status: AC
  Administered 2024-01-05: 1000 ug via INTRAMUSCULAR
  Filled 2024-01-05: qty 1

## 2024-01-06 ENCOUNTER — Ambulatory Visit: Attending: Physician Assistant

## 2024-01-06 ENCOUNTER — Encounter: Payer: Self-pay | Admitting: Oncology

## 2024-01-06 DIAGNOSIS — R55 Syncope and collapse: Secondary | ICD-10-CM

## 2024-01-06 LAB — ECHOCARDIOGRAM COMPLETE
AR max vel: 1.77 cm2
AV Area VTI: 1.82 cm2
AV Area mean vel: 1.66 cm2
AV Mean grad: 5 mmHg
AV Peak grad: 8.8 mmHg
Ao pk vel: 1.48 m/s
Area-P 1/2: 3.6 cm2
S' Lateral: 2.74 cm

## 2024-01-07 ENCOUNTER — Other Ambulatory Visit (INDEPENDENT_AMBULATORY_CARE_PROVIDER_SITE_OTHER): Payer: Self-pay | Admitting: Nurse Practitioner

## 2024-01-07 ENCOUNTER — Other Ambulatory Visit (INDEPENDENT_AMBULATORY_CARE_PROVIDER_SITE_OTHER)

## 2024-01-07 ENCOUNTER — Ambulatory Visit (INDEPENDENT_AMBULATORY_CARE_PROVIDER_SITE_OTHER): Admitting: Nurse Practitioner

## 2024-01-07 ENCOUNTER — Encounter (INDEPENDENT_AMBULATORY_CARE_PROVIDER_SITE_OTHER): Payer: Self-pay | Admitting: Nurse Practitioner

## 2024-01-07 VITALS — BP 129/86 | HR 92 | Resp 18 | Wt 166.0 lb

## 2024-01-07 DIAGNOSIS — I729 Aneurysm of unspecified site: Secondary | ICD-10-CM

## 2024-01-07 DIAGNOSIS — Z9889 Other specified postprocedural states: Secondary | ICD-10-CM | POA: Diagnosis not present

## 2024-01-07 DIAGNOSIS — I739 Peripheral vascular disease, unspecified: Secondary | ICD-10-CM | POA: Diagnosis not present

## 2024-01-07 MED ORDER — HYDROCODONE-ACETAMINOPHEN 5-325 MG PO TABS: 1.0000 | ORAL_TABLET | Freq: Four times a day (QID) | ORAL | 0 refills | Status: DC | PRN

## 2024-01-08 NOTE — Progress Notes (Deleted)
 Referring Physician:  [name], PA [address] [place],  [place] [address]  Primary Physician:  Ziglar, Susan K, MD  History of Present Illness: 01/08/2024 [name] is here today with a chief complaint of ***  Chronic Degenerative disc disease. left-sided neck pain  intermittent tingling in the right upper extremity   Duration: *** Location: *** Quality: *** Severity: ***  Precipitating: aggravated by *** Modifying factors: made better by *** Weakness: none Timing: *** Bowel/Bladder Dysfunction: none  Conservative measures:  Physical therapy: ***  Multimodal medical therapy including regular antiinflammatories: *** gabapentin , methocarbamol, hydrocodone ,  Injections: *** epidural steroid injections?  Past Surgery: ***  Connie Cameron has ***no symptoms of cervical myelopathy.  The symptoms are causing a significant impact on the patient's life.   Review of Systems:  A 10 point review of systems is negative, except for the pertinent positives and negatives detailed in the HPI.  Past Medical History: 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(HCC)     Past Surgical History: Past Surgical History:  Procedure Laterality Date   CAROTID PTA/STENT INTERVENTION N/A 11/24/2023   Procedure: CAROTID PTA/STENT INTERVENTION;  Surgeon: Marea Selinda RAMAN, MD;  Location: [facility];  Service: Cardiovascular;  Laterality: N/A;   COLONOSCOPY WITH PROPOFOL  N/A 03/30/2020   Procedure: COLONOSCOPY WITH PROPOFOL ;  Surgeon: Janalyn Keene NOVAK, MD;  Location: [facility];  Service: Endoscopy;  Laterality: N/A;  COVID POSITIVE 02/29/2020   CORONARY/GRAFT ACUTE MI REVASCULARIZATION N/A 01/23/2022   Procedure: Coronary/Graft Acute MI Revascularization;  Surgeon: Mady Bruckner, MD;  Location: [facility];  Service:  Cardiovascular;  Laterality: N/A;   ESOPHAGOGASTRODUODENOSCOPY N/A 07/24/2023   Procedure: EGD (ESOPHAGOGASTRODUODENOSCOPY);  Surgeon: Unk Corinn Skiff, MD;  Location: [facility] ENDOSCOPY;  Service: Gastroenterology;  Laterality: N/A;   ESOPHAGOGASTRODUODENOSCOPY (EGD) WITH PROPOFOL  N/A 03/30/2020   Procedure: ESOPHAGOGASTRODUODENOSCOPY (EGD) WITH PROPOFOL ;  Surgeon: Janalyn Keene NOVAK, MD;  Location: [facility];  Service: Endoscopy;  Laterality: N/A;   ESOPHAGOGASTRODUODENOSCOPY (EGD) WITH PROPOFOL  N/A 08/03/2020   Procedure: ESOPHAGOGASTRODUODENOSCOPY (EGD) WITH PROPOFOL ;  Surgeon: Janalyn Keene NOVAK, MD;  Location: [facility];  Service: Endoscopy;  Laterality: N/A;   ESOPHAGOGASTRODUODENOSCOPY (EGD) WITH PROPOFOL  N/A 01/25/2021   Procedure: ESOPHAGOGASTRODUODENOSCOPY (EGD) WITH PROPOFOL ;  Surgeon: Janalyn Keene NOVAK, MD;  Location: [facility];  Service: Endoscopy;  Laterality: N/A;   ESOPHAGOGASTRODUODENOSCOPY (EGD) WITH PROPOFOL  N/A 09/05/2021   Procedure: ESOPHAGOGASTRODUODENOSCOPY (EGD) WITH PROPOFOL ;  Surgeon: Unk Corinn Skiff, MD;  Location: [facility];  Service: Gastroenterology;  Laterality: N/A;   EXCISION MASS NECK Left 01/18/2019   Procedure: EXCISION MASS NECK/NODE;  Surgeon: Edda Mt, MD;  Location: [facility];  Service: ENT;  Laterality: Left;   FEMORAL ARTERY EXPLORATION Right 11/26/2023   Procedure: EXPLORATION, ARTERY, FEMORAL;  Surgeon: Marea Selinda RAMAN, MD;  Location: [facility];  Service: Vascular;  Laterality: Right;   IR REMOVAL TUN ACCESS W/ PORT W/O FL MOD SED  12/05/2021   LARYNGOSCOPY Bilateral 02/22/2019   Procedure: MICROSCOPIC DIRECT LARYNGOSCOPY AND BIOPSY;  Surgeon: Edda Mt, MD;  Location: [facility];  Service: ENT;  Laterality: Bilateral;   LEFT HEART CATH AND CORONARY ANGIOGRAPHY N/A 01/23/2022   Procedure: LEFT HEART CATH AND CORONARY ANGIOGRAPHY;  Surgeon: Mady Bruckner, MD;  Location: [facility];  Service: Cardiovascular;   Laterality: N/A;   LOWER EXTREMITY ANGIOGRAPHY Right  11/24/2023   Procedure: Lower Extremity Angiography;  Surgeon: Marea Selinda RAMAN, MD;  Location: [facility];  Service: Cardiovascular;  Laterality: Right;   PORTA CATH INSERTION N/A 03/24/2019   Procedure: PORTA CATH INSERTION;  Surgeon: Jama Cordella MATSU, MD;  Location: [facility];  Service: Cardiovascular;  Laterality: N/A;   TONSILLECTOMY     TUBAL LIGATION      Allergies: Allergies as of 01/12/2024 - Review Complete 01/07/2024  Allergen Reaction Noted   Chlorhexidine   06/29/2020    Medications: Outpatient Encounter Medications as of 01/12/2024  Medication Sig   ALPRAZolam  (XANAX ) 1 MG tablet Take 1 tablet (1 mg total) by mouth 2 (two) times daily as needed for anxiety.   aspirin  81 MG chewable tablet Chew 1 tablet (81 mg total) by mouth daily.   atorvastatin  (LIPITOR ) 80 MG tablet TAKE 1 TABLET BY MOUTH EVERY DAY   Docusate Sodium  (DSS) 100 MG CAPS Take 1 capsule by mouth every other day.   gabapentin  (NEURONTIN ) 300 MG capsule Take 1 capsule (300 mg total) by mouth 2 (two) times daily.   HYDROcodone -acetaminophen  (NORCO/VICODIN) 5-325 MG tablet Take 1-2 tablets by mouth every 6 (six) hours as needed for moderate pain (pain score 4-6).   isosorbide  mononitrate (IMDUR ) 30 MG 24 hr tablet TAKE 1/2 OF A TABLET (15 MG TOTAL) BY MOUTH DAILY   meclizine  (ANTIVERT ) 12.5 MG tablet Take 1 tablet (12.5 mg total) by mouth 3 (three) times daily as needed for dizziness or nausea.   metFORMIN  (GLUCOPHAGE -XR) 500 MG 24 hr tablet Take 1 tablet (500 mg total) by mouth daily with breakfast.   methocarbamol (ROBAXIN) 500 MG tablet Take 500 mg by mouth at bedtime. (Patient not taking: Reported on 01/07/2024)   metoprolol  succinate (TOPROL -XL) 25 MG 24 hr tablet TAKE 0.5 TABLETS BY MOUTH DAILY. TAKE WITH OR IMMEDIATELY FOLLOWING A MEAL.   sertraline  (ZOLOFT ) 50 MG tablet Take 1 tablet (50 mg total) by mouth daily.   ticagrelor  (BRILINTA )  90 MG TABS tablet Take 1 tablet (90 mg total) by mouth 2 (two) times daily.   Vitamin D , Ergocalciferol , (DRISDOL ) 1.25 MG (50000 UNIT) CAPS capsule Take 50,000 Units by mouth once a week.   No facility-administered encounter medications on file as of 01/12/2024.    Social History: Social History   Tobacco Use   Smoking status: Never    Passive exposure: Never   Smokeless tobacco: Never  Vaping Use   Vaping status: Never Used  Substance Use Topics   Alcohol use: Not Currently   Drug use: No    Family Medical History: Family History  Problem Relation Age of Onset   Heart disease Mother    Cancer Mother    Breast cancer Sister    Brain cancer Paternal Grandfather     Physical Examination: @VITALWITHPAIN @  General: Patient is well developed, well nourished, calm, collected, and in no apparent distress. Attention to examination is appropriate.  Psychiatric: Patient is non-anxious.  Head:  Pupils equal, round, and reactive to light.  ENT:  Oral mucosa appears well hydrated.  Neck:   Supple.  ***Full range of motion.  Respiratory: Patient is breathing without any difficulty.  Extremities: No edema.  Vascular: Palpable dorsal pedal pulses.  Skin:   On exposed skin, there are no abnormal skin lesions.  NEUROLOGICAL:     Awake, alert, oriented to person, place, and time.  Speech is clear and fluent. Fund of knowledge is appropriate.   Cranial Nerves: Pupils equal  round and reactive to light.  Facial tone is symmetric.  Facial sensation is symmetric.  ROM of spine: ***full.  Palpation of spine: ***non tender.    Strength: Side Biceps Triceps Deltoid Interossei Grip Wrist Ext. Wrist Flex.  R 5 5 5 5 5 5 5   L 5 5 5 5 5 5 5    Side Iliopsoas Quads Hamstring PF DF EHL  R 5 5 5 5 5 5   L 5 5 5 5 5 5    Reflexes are ***2+ and symmetric at the biceps, triceps, brachioradialis, patella and achilles.   Hoffman's is absent.  Clonus is not present.  Toes are down-going.   Bilateral upper and lower extremity sensation is intact to light touch.    Gait is normal.   No difficulty with tandem gait.   No evidence of dysmetria noted.  Medical Decision Making  Imaging: ***  I have personally reviewed the images and agree with the above interpretation.  Assessment and Plan: Connie Cameron is a pleasant 63 y.o. female with ***    Thank you for involving me in the care of this patient.   I spent a total of *** minutes in both face-to-face and non-face-to-face activities for this visit on the date of this encounter.   Lyle Decamp, PA-C Dept. of Neurosurgery
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
## 2024-01-09 ENCOUNTER — Ambulatory Visit: Attending: Physician Assistant | Admitting: Physician Assistant

## 2024-01-09 ENCOUNTER — Encounter: Payer: Self-pay | Admitting: Physician Assistant

## 2024-01-09 VITALS — BP 116/80 | HR 88 | Ht 62.0 in | Wt 166.0 lb

## 2024-01-09 DIAGNOSIS — I4729 Other ventricular tachycardia: Secondary | ICD-10-CM | POA: Diagnosis not present

## 2024-01-09 DIAGNOSIS — Z9889 Other specified postprocedural states: Secondary | ICD-10-CM

## 2024-01-09 DIAGNOSIS — E785 Hyperlipidemia, unspecified: Secondary | ICD-10-CM | POA: Diagnosis not present

## 2024-01-09 DIAGNOSIS — I2510 Atherosclerotic heart disease of native coronary artery without angina pectoris: Secondary | ICD-10-CM | POA: Diagnosis not present

## 2024-01-09 DIAGNOSIS — I471 Supraventricular tachycardia, unspecified: Secondary | ICD-10-CM

## 2024-01-09 DIAGNOSIS — R55 Syncope and collapse: Secondary | ICD-10-CM

## 2024-01-09 DIAGNOSIS — I6523 Occlusion and stenosis of bilateral carotid arteries: Secondary | ICD-10-CM

## 2024-01-09 NOTE — Patient Instructions (Signed)
 Medication Instructions:  Your physician recommends that you continue on your current medications as directed. Please refer to the Current Medication list given to you today.   *If you need a refill on your cardiac medications before your next appointment, please call your pharmacy*  Lab Work: None ordered at this time   Follow-Up: At [facility], you and your health needs are our priority.  As part of our continuing mission to provide you with exceptional heart care, our providers are all part of one team.  This team includes your primary Cardiologist (physician) and Advanced Practice Providers or APPs (Physician Assistants and Nurse Practitioners) who all work together to provide you with the care you need, when you need it.  Your next appointment:   6 month(s)  Provider:   You may see Sammy Crisp, MD or Varney Gentleman, PA-C

## 2024-01-10 LAB — AEROBIC CULTURE

## 2024-01-11 ENCOUNTER — Encounter (INDEPENDENT_AMBULATORY_CARE_PROVIDER_SITE_OTHER): Payer: Self-pay | Admitting: Nurse Practitioner

## 2024-01-11 NOTE — Progress Notes (Signed)
 Subjective:    Patient ID: Connie Cameron, female    DOB: Dec 20, 1960, 63 y.o.   MRN: [id number] Chief Complaint  Patient presents with   Follow-up    2 week with ABI    HPI  Discussed the use of AI scribe software for clinical note transcription with the patient, who gave verbal consent to proceed.  History of Present Illness Connie Cameron is a 63 year old female who presents for follow-up on wound healing and pain management.  She experiences pain in her right arm, particularly inside and sometimes at the initial site of the pseudoaneurysm. The pain intensity ranges from 7 to 10, especially at night, and is located under the area of the pseudoaneurysm and on the inside of her leg. There is associated numbness in the area, which she attributes to healing. She has been using hydrocodone  for pain management, which she finds helpful, but she has run out and requires more. She does not take it consistently and is not receiving pain medication from any other sources.  She is adhering to a wound care regimen that includes using dial soap to wash above the wound, drying it with gauze, applying ointment, and covering it with gauze. The wound is approximately a centimeter deep, and she changes the dressing daily. She recalls having a swab done in the hospital but is unsure if it was cultured. No signs of infection such as odor or unusual appearance of the wound.  She is aware that her right leg blood flow was measured at 1.24 and her left at 1.29.  She has been experiencing a dry cough for a couple of days, which she attributes to the time of year and possible exposure to cats.    Results DIAGNOSTIC Ankle-Brachial Index: Right leg 1.24, Left leg 1.29 (01/07/2024)  PROCEDURE Wound Culture: Swab obtained from wound for culture   Review of Systems  Skin:  Positive for wound.  All other systems reviewed and are negative.      Objective:   Physical Exam Vitals reviewed.  HENT:      Head: Normocephalic.  Cardiovascular:     Rate and Rhythm: Normal rate.     Pulses: Normal pulses.  Pulmonary:     Effort: Pulmonary effort is normal.  Skin:    General: Skin is warm and dry.  Neurological:     Mental Status: She is alert and oriented to person, place, and time.  Psychiatric:        Mood and Affect: Mood normal.        Behavior: Behavior normal.        Thought Content: Thought content normal.        Judgment: Judgment normal.     Physical Exam CARDIOVASCULAR: Good blood flow in legs, right leg ABI 1.24, left leg ABI 1.29. SKIN: Wound approximately 1 cm deep, not infected, no odor.  BP 129/86 (BP Location: Left Arm)   Pulse 92   Resp 18   Wt 166 lb (75.3 kg)   BMI 30.36 kg/m   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(HCC)     Social History   Socioeconomic History   Marital status: Single    Spouse name: Not on file   Number of children: Not on file   Years of education: Not on file   Highest education  level: Not on file  Occupational History   Not on file  Tobacco Use   Smoking status: Never    Passive exposure: Never   Smokeless tobacco: Never  Vaping Use   Vaping status: Never Used  Substance and Sexual Activity   Alcohol use: Not Currently   Drug use: No   Sexual activity: Not on file  Other Topics Concern   Not on file  Social History Narrative   Not on file   Social Drivers of Health   Financial Resource Strain: Not on file  Food Insecurity: No Food Insecurity (11/24/2023)   Hunger Vital Sign    Worried About Running Out of Food in the Last Year: Never true    Ran Out of Food in the Last Year: Never true  Transportation Needs: No Transportation Needs (11/24/2023)   PRAPARE - Administrator, Civil Service (Medical): No    Lack of Transportation (Non-Medical): No  Physical Activity: Not on file  Stress:  Not on file  Social Connections: Not on file  Intimate Partner Violence: Not At Risk (11/24/2023)   Humiliation, Afraid, Rape, and Kick questionnaire    Fear of Current or Ex-Partner: No    Emotionally Abused: No    Physically Abused: No    Sexually Abused: No    Past Surgical History:  Procedure Laterality Date   CAROTID PTA/STENT INTERVENTION N/A 11/24/2023   Procedure: CAROTID PTA/STENT INTERVENTION;  Surgeon: Marea Selinda RAMAN, MD;  Location: [facility];  Service: Cardiovascular;  Laterality: N/A;   COLONOSCOPY WITH PROPOFOL  N/A 03/30/2020   Procedure: COLONOSCOPY WITH PROPOFOL ;  Surgeon: Janalyn Keene NOVAK, MD;  Location: [facility];  Service: Endoscopy;  Laterality: N/A;  COVID POSITIVE 02/29/2020   CORONARY/GRAFT ACUTE MI REVASCULARIZATION N/A 01/23/2022   Procedure: Coronary/Graft Acute MI Revascularization;  Surgeon: Mady Bruckner, MD;  Location: [facility];  Service: Cardiovascular;  Laterality: N/A;   ESOPHAGOGASTRODUODENOSCOPY N/A 07/24/2023   Procedure: EGD (ESOPHAGOGASTRODUODENOSCOPY);  Surgeon: Unk Corinn Skiff, MD;  Location: [facility];  Service: Gastroenterology;  Laterality: N/A;   ESOPHAGOGASTRODUODENOSCOPY (EGD) WITH PROPOFOL  N/A 03/30/2020   Procedure: ESOPHAGOGASTRODUODENOSCOPY (EGD) WITH PROPOFOL ;  Surgeon: Janalyn Keene NOVAK, MD;  Location: [facility];  Service: Endoscopy;  Laterality: N/A;   ESOPHAGOGASTRODUODENOSCOPY (EGD) WITH PROPOFOL  N/A 08/03/2020   Procedure: ESOPHAGOGASTRODUODENOSCOPY (EGD) WITH PROPOFOL ;  Surgeon: Janalyn Keene NOVAK, MD;  Location: [facility];  Service: Endoscopy;  Laterality: N/A;   ESOPHAGOGASTRODUODENOSCOPY (EGD) WITH PROPOFOL  N/A 01/25/2021   Procedure: ESOPHAGOGASTRODUODENOSCOPY (EGD) WITH PROPOFOL ;  Surgeon: Janalyn Keene NOVAK, MD;  Location: [facility];  Service: Endoscopy;  Laterality: N/A;   ESOPHAGOGASTRODUODENOSCOPY (EGD) WITH PROPOFOL  N/A 09/05/2021   Procedure:  ESOPHAGOGASTRODUODENOSCOPY (EGD) WITH PROPOFOL ;  Surgeon: Unk Corinn Skiff, MD;  Location: [facility];  Service: Gastroenterology;  Laterality: N/A;   EXCISION MASS NECK Left 01/18/2019   Procedure: EXCISION MASS NECK/NODE;  Surgeon: Edda Mt, MD;  Location: [facility];  Service: ENT;  Laterality: Left;   FEMORAL ARTERY EXPLORATION Right 11/26/2023   Procedure: EXPLORATION, ARTERY, FEMORAL;  Surgeon: Marea Selinda RAMAN, MD;  Location: [facility];  Service: Vascular;  Laterality: Right;   IR REMOVAL TUN ACCESS W/ PORT W/O FL MOD SED  12/05/2021   LARYNGOSCOPY Bilateral 02/22/2019   Procedure: MICROSCOPIC DIRECT LARYNGOSCOPY AND BIOPSY;  Surgeon: Edda Mt, MD;  Location: [facility];  Service: ENT;  Laterality: Bilateral;   LEFT HEART CATH AND CORONARY ANGIOGRAPHY N/A 01/23/2022   Procedure: LEFT HEART CATH AND CORONARY ANGIOGRAPHY;  Surgeon: Mady Bruckner, MD;  Location: [facility];  Service: Cardiovascular;  Laterality: N/A;   LOWER EXTREMITY ANGIOGRAPHY Right 11/24/2023   Procedure: Lower Extremity Angiography;  Surgeon: Marea Selinda RAMAN, MD;  Location: [facility];  Service: Cardiovascular;  Laterality: Right;   PORTA CATH INSERTION N/A 03/24/2019   Procedure: PORTA CATH INSERTION;  Surgeon: Jama Cordella MATSU, MD;  Location: [facility];  Service: Cardiovascular;  Laterality: N/A;   TONSILLECTOMY     TUBAL LIGATION      Family History  Problem Relation Age of Onset   Heart disease Mother    Cancer Mother    Breast cancer Sister    Brain cancer Paternal Grandfather     Allergies  Allergen Reactions   Chlorhexidine      Pt doesn't have anything that requires CHG bath       Latest Ref Rng & Units 12/04/2023    1:42 PM 11/27/2023    2:38 PM 11/27/2023    6:25 AM  CBC  WBC 4.0 - 10.5 K/uL 7.1  12.8  12.3   Hemoglobin 12.0 - 15.0 g/dL 89.9  8.7  8.4   Hematocrit 36.0 - 46.0 % 31.4  26.4  25.1   Platelets 150 - 400 K/uL 349  192  191       CMP      Component Value Date/Time   NA 137 12/04/2023 1342   NA 145 (H) 11/06/2023 1151   NA 140 06/24/2011 1709   K 3.9 12/04/2023 1342   K 4.3 06/24/2011 1709   CL 103 12/04/2023 1342   CL 105 06/24/2011 1709   CO2 23 12/04/2023 1342   CO2 30 06/24/2011 1709   GLUCOSE 135 (H) 12/04/2023 1342   GLUCOSE 101 (H) 06/24/2011 1709   BUN 13 12/04/2023 1342   BUN 14 11/06/2023 1151   BUN 17 06/24/2011 1709   CREATININE 1.11 (H) 12/04/2023 1342   CREATININE 0.91 06/24/2011 1709   CALCIUM  9.4 12/04/2023 1342   CALCIUM  9.4 06/24/2011 1709   PROT 7.5 12/04/2023 1342   PROT 6.7 11/06/2023 1151   PROT 8.4 (H) 06/24/2011 1709   ALBUMIN 4.0 12/04/2023 1342   ALBUMIN 4.5 11/06/2023 1151   ALBUMIN 4.1 06/24/2011 1709   AST 23 12/04/2023 1342   ALT 13 12/04/2023 1342   ALT 18 06/24/2011 1709   ALKPHOS 90 12/04/2023 1342   ALKPHOS 99 06/24/2011 1709   BILITOT 1.6 (H) 12/04/2023 1342   EGFR 63 11/06/2023 1151   GFRNONAA 56 (L) 12/04/2023 1342   GFRNONAA >60 06/24/2011 1709     VAS US  ABI WITH/WO TBI Result Date: 01/07/2024  LOWER EXTREMITY DOPPLER STUDY Patient Name:  JAMESHIA HAYASHIDA  Date of Exam:   01/07/2024 Medical Rec #: [id number]        Accession #:    [id number] Date of Birth: 12-29-1960         Patient Gender: F Patient Age:   59 years Exam Location:  [facility] Procedure:      VAS US  ABI WITH/WO TBI Referring Phys: ORVIN DARING --------------------------------------------------------------------------------  Indications: rt dfa pseudo and repair  Vascular Interventions: 11/24/2023 Angiography of the right lower extremity                         including selective images of the right profunda femoris  artery and superficial femoral artery as well as                         sidebranches                         4. Covered stent placement to the right common femoral                         artery with 6 mm diameter by 2.5 cm length Viabahn stent                          and 8 mm diameter by 5 cm length Viabahn stent                         5. Coil embolization of right common femoral artery                         branches with a 4 mm x 10 cm and a 5 cm packing Ruby                         coil. Performing Technologist: Jerel Croak RVT  Examination Guidelines: A complete evaluation includes at minimum, Doppler waveform signals and systolic blood pressure reading at the level of bilateral brachial, anterior tibial, and posterior tibial arteries, when vessel segments are accessible. Bilateral testing is considered an integral part of a complete examination. Photoelectric Plethysmograph (PPG) waveforms and toe systolic pressure readings are included as required and additional duplex testing as needed. Limited examinations for reoccurring indications may be performed as noted.  ABI Findings: +---------+------------------+-----+---------+--------+ Right    Rt Pressure (mmHg)IndexWaveform Comment  +---------+------------------+-----+---------+--------+ Brachial 114                                      +---------+------------------+-----+---------+--------+ PTA      144               1.24 triphasic         +---------+------------------+-----+---------+--------+ DP       135               1.16 triphasic         +---------+------------------+-----+---------+--------+ Great Toe131               1.13 Normal            +---------+------------------+-----+---------+--------+ +---------+------------------+-----+---------+-------+ Left     Lt Pressure (mmHg)IndexWaveform Comment +---------+------------------+-----+---------+-------+ Brachial 116                                     +---------+------------------+-----+---------+-------+ PTA      150               1.29 triphasic        +---------+------------------+-----+---------+-------+ DP       135               1.16 triphasic         +---------+------------------+-----+---------+-------+ Great Toe122               1.05 Normal           +---------+------------------+-----+---------+-------+  Summary: Right: Resting right ankle-brachial index is within normal range. The right toe-brachial index is normal.  Left: Resting left ankle-brachial index is within normal range. The left toe-brachial index is normal.  *See table(s) above for measurements and observations.  Electronically signed by Selinda Gu MD on 01/07/2024 at 3:56:19 PM.    Final        Assessment & Plan:   1. Pseudoaneurysm of vascular access site (Primary)  Assessment and Plan Assessment & Plan Femoral artery pseudoaneurysm post-repair Post-repair with good blood flow in legs. Right leg flow at 1.24, left at 1.29. No infection or tunneling. Pain 7-10, numbness present. Healing ongoing with expected irritation and numbness. - Continue daily wound care with dial soap, rinse, dry with gauze, apply ointment, cover with gauze. - Perform wet-to-dry dressing changes daily. - Prescribed hydrocodone  for pain management, ensuring no other pain medication sources. - Swabbed wound for culture to rule out infection. - Scheduled follow-up appointments every couple of weeks to monitor healing.     Current Outpatient Medications on File Prior to Visit  Medication Sig Dispense Refill   ALPRAZolam  (XANAX ) 1 MG tablet Take 1 tablet (1 mg total) by mouth 2 (two) times daily as needed for anxiety. 60 tablet 2   aspirin  81 MG chewable tablet Chew 1 tablet (81 mg total) by mouth daily. 30 tablet 1   atorvastatin  (LIPITOR ) 80 MG tablet TAKE 1 TABLET BY MOUTH EVERY DAY 90 tablet 1   Docusate Sodium  (DSS) 100 MG CAPS Take 1 capsule by mouth every other day.     gabapentin  (NEURONTIN ) 300 MG capsule Take 1 capsule (300 mg total) by mouth 2 (two) times daily. 60 capsule 5   isosorbide  mononitrate (IMDUR ) 30 MG 24 hr tablet TAKE 1/2 OF A TABLET (15 MG TOTAL) BY MOUTH DAILY 45 tablet  3   meclizine  (ANTIVERT ) 12.5 MG tablet Take 1 tablet (12.5 mg total) by mouth 3 (three) times daily as needed for dizziness or nausea. 30 tablet 0   metFORMIN  (GLUCOPHAGE -XR) 500 MG 24 hr tablet Take 1 tablet (500 mg total) by mouth daily with breakfast. 30 tablet 3   metoprolol  succinate (TOPROL -XL) 25 MG 24 hr tablet TAKE 0.5 TABLETS BY MOUTH DAILY. TAKE WITH OR IMMEDIATELY FOLLOWING A MEAL. 45 tablet 3   sertraline  (ZOLOFT ) 50 MG tablet Take 1 tablet (50 mg total) by mouth daily. 30 tablet 3   ticagrelor  (BRILINTA ) 90 MG TABS tablet Take 1 tablet (90 mg total) by mouth 2 (two) times daily. 180 tablet 3   Vitamin D , Ergocalciferol , (DRISDOL ) 1.25 MG (50000 UNIT) CAPS capsule Take 50,000 Units by mouth once a week.     methocarbamol (ROBAXIN) 500 MG tablet Take 500 mg by mouth at bedtime. (Patient taking differently: Take 500 mg by mouth as needed.)     No current facility-administered medications on file prior to visit.    There are no Patient Instructions on file for this visit. Return in about 2 years (around 01/06/2026) for No studies wound check FB.   Toccara Alford E Meilah Delrosario, NP

## 2024-01-12 ENCOUNTER — Ambulatory Visit: Admitting: Physician Assistant

## 2024-01-12 ENCOUNTER — Other Ambulatory Visit (INDEPENDENT_AMBULATORY_CARE_PROVIDER_SITE_OTHER): Payer: Self-pay | Admitting: Nurse Practitioner

## 2024-01-12 ENCOUNTER — Ambulatory Visit (INDEPENDENT_AMBULATORY_CARE_PROVIDER_SITE_OTHER): Payer: Self-pay | Admitting: Nurse Practitioner

## 2024-01-12 MED ORDER — SULFAMETHOXAZOLE-TRIMETHOPRIM 800-160 MG PO TABS: 1.0000 | ORAL_TABLET | Freq: Two times a day (BID) | ORAL | 0 refills | Status: DC

## 2024-01-12 NOTE — Progress Notes (Signed)
 I sent in bactrim  based on the culture

## 2024-01-12 NOTE — Progress Notes (Signed)
 Patient has been notified with culture results and antibiotic has been sent pharmacy. Patient verbalized understanding

## 2024-01-21 ENCOUNTER — Ambulatory Visit (INDEPENDENT_AMBULATORY_CARE_PROVIDER_SITE_OTHER): Admitting: Nurse Practitioner

## 2024-01-26 ENCOUNTER — Encounter (INDEPENDENT_AMBULATORY_CARE_PROVIDER_SITE_OTHER): Payer: Self-pay | Admitting: Nurse Practitioner

## 2024-01-26 ENCOUNTER — Ambulatory Visit (INDEPENDENT_AMBULATORY_CARE_PROVIDER_SITE_OTHER): Admitting: Nurse Practitioner

## 2024-01-26 VITALS — BP 148/93 | HR 83 | Resp 18 | Wt 168.2 lb

## 2024-01-26 DIAGNOSIS — I739 Peripheral vascular disease, unspecified: Secondary | ICD-10-CM

## 2024-01-26 DIAGNOSIS — I6523 Occlusion and stenosis of bilateral carotid arteries: Secondary | ICD-10-CM

## 2024-01-26 DIAGNOSIS — Z9889 Other specified postprocedural states: Secondary | ICD-10-CM

## 2024-01-26 NOTE — Progress Notes (Signed)
 "  Subjective:    Patient ID: Connie Cameron, female    DOB: 1960-05-10, 63 y.o.   MRN: [id number] Chief Complaint  Patient presents with   Follow-up    2 week follow up    HPI  Discussed the use of AI scribe software for clinical note transcription with the patient, who gave verbal consent to proceed.  History of Present Illness Connie Cameron is a 63 year old female with bilateral carotid artery stenosis status post carotid stenting and peripheral arterial disease who presents for follow-up of a healing chronic wound and vascular surveillance.  Since the intervention, she reports that she has not had further episodes of passing out and feels her problem has been fixed.  She is currently managing a chronic wound at the site of her prior vascular procedure. The wound has markedly improved, now presenting as a small opening with minimal serous drainage, reduced from approximately quarter-sized. She continues Santal and gauze dressings, intermittently leaving the dressing off to monitor for drainage, and is taking an oral antibiotic. She notes persistent numbness in the area and describes localized tenderness to touch. She is ambulating without difficulty.  She has experienced recent episodes of elevated blood pressure, with systolic readings up to 168 mmHg, which she considers elevated compared to her baseline. On one occasion, she experienced imbalance upon rising, which she associates with the elevated blood pressure. She is scheduled to follow up with her primary care provider for further evaluation.     Results Radiology Head and neck CT: Carotid artery stenosis; findings consistent with symptomatic carotid disease  Diagnostic Carotid/vascular ultrasound (12/2023): Normal blood flow; no evidence of subclavian steal syndrome or subclavian artery stenosis   Review of Systems  Skin:  Positive for wound.  Neurological:  Positive for numbness. Negative for syncope.  All other  systems reviewed and are negative.      Objective:   Physical Exam Vitals reviewed.  HENT:     Head: Normocephalic.  Cardiovascular:     Rate and Rhythm: Normal rate.  Pulmonary:     Effort: Pulmonary effort is normal.  Skin:    General: Skin is warm and dry.  Neurological:     Mental Status: She is alert and oriented to person, place, and time.  Psychiatric:        Mood and Affect: Mood normal.        Behavior: Behavior normal.        Thought Content: Thought content normal.        Judgment: Judgment normal.     Physical Exam SKIN: Wound healing with reduced size and minimal drainage.  BP (!) 148/93 (BP Location: Right Arm)   Pulse 83   Resp 18   Wt 168 lb 3.2 oz (76.3 kg)   BMI 30.76 kg/m   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(HCC)     Social History   Socioeconomic History   Marital status: Single    Spouse name: Not on file   Number of children: Not on file   Years of education: Not on file   Highest education level: Not on file  Occupational History   Not on file  Tobacco Use   Smoking status: Never    Passive exposure: Never   Smokeless tobacco: Never  Vaping Use   Vaping status: Never Used  Substance and Sexual Activity   Alcohol use: Not Currently   Drug use: No   Sexual activity: Not on file  Other Topics Concern   Not on file  Social History Narrative   Not on file   Social Drivers of Health   Tobacco Use: Low Risk (01/26/2024)   Patient History    Smoking Tobacco Use: Never    Smokeless Tobacco Use: Never    Passive Exposure: Never  Financial Resource Strain: Not on file  Food Insecurity: No Food Insecurity (11/24/2023)   Epic    Worried About Programme Researcher, Broadcasting/film/video in the Last Year: Never true    Ran Out of Food in the Last Year: Never true  Transportation Needs: No Transportation Needs (11/24/2023)   Epic    Lack  of Transportation (Medical): No    Lack of Transportation (Non-Medical): No  Physical Activity: Not on file  Stress: Not on file  Social Connections: Not on file  Intimate Partner Violence: Not At Risk (11/24/2023)   Epic    Fear of Current or Ex-Partner: No    Emotionally Abused: No    Physically Abused: No    Sexually Abused: No  Depression (PHQ2-9): Low Risk (12/04/2023)   Depression (PHQ2-9)    PHQ-2 Score: 0  Alcohol Screen: Not on file  Housing: Low Risk (11/24/2023)   Epic    Unable to Pay for Housing in the Last Year: No    Number of Times Moved in the Last Year: 0    Homeless in the Last Year: No  Utilities: Not At Risk (11/24/2023)   Epic    Threatened with loss of utilities: No  Health Literacy: Adequate Health Literacy (03/04/2023)   B1300 Health Literacy    Frequency of need for help with medical instructions: Never    Past Surgical History:  Procedure Laterality Date   CAROTID PTA/STENT INTERVENTION N/A 11/24/2023   Procedure: CAROTID PTA/STENT INTERVENTION;  Surgeon: Marea Selinda RAMAN, MD;  Location: [facility];  Service: Cardiovascular;  Laterality: N/A;   COLONOSCOPY WITH PROPOFOL  N/A 03/30/2020   Procedure: COLONOSCOPY WITH PROPOFOL ;  Surgeon: Janalyn Keene NOVAK, MD;  Location: [facility];  Service: Endoscopy;  Laterality: N/A;  COVID POSITIVE 02/29/2020   CORONARY/GRAFT ACUTE MI REVASCULARIZATION N/A 01/23/2022   Procedure: Coronary/Graft Acute MI Revascularization;  Surgeon: Mady Bruckner, MD;  Location: [facility];  Service: Cardiovascular;  Laterality: N/A;   ESOPHAGOGASTRODUODENOSCOPY N/A 07/24/2023   Procedure: EGD (ESOPHAGOGASTRODUODENOSCOPY);  Surgeon: Unk Corinn Skiff, MD;  Location: [facility];  Service: Gastroenterology;  Laterality: N/A;   ESOPHAGOGASTRODUODENOSCOPY (EGD) WITH PROPOFOL  N/A 03/30/2020   Procedure: ESOPHAGOGASTRODUODENOSCOPY (EGD) WITH PROPOFOL ;  Surgeon: Janalyn Keene NOVAK, MD;  Location: [facility];   Service: Endoscopy;  Laterality: N/A;   ESOPHAGOGASTRODUODENOSCOPY (EGD) WITH PROPOFOL  N/A 08/03/2020   Procedure: ESOPHAGOGASTRODUODENOSCOPY (EGD) WITH PROPOFOL ;  Surgeon: Janalyn Keene NOVAK, MD;  Location: [facility];  Service: Endoscopy;  Laterality: N/A;   ESOPHAGOGASTRODUODENOSCOPY (EGD) WITH PROPOFOL  N/A 01/25/2021   Procedure: ESOPHAGOGASTRODUODENOSCOPY (EGD) WITH PROPOFOL ;  Surgeon: Janalyn Keene NOVAK, MD;  Location: [facility];  Service: Endoscopy;  Laterality: N/A;   ESOPHAGOGASTRODUODENOSCOPY (EGD) WITH PROPOFOL  N/A 09/05/2021   Procedure: ESOPHAGOGASTRODUODENOSCOPY (EGD) WITH PROPOFOL ;  Surgeon: Unk Corinn Skiff, MD;  Location: [facility];  Service: Gastroenterology;  Laterality: N/A;   EXCISION MASS NECK Left 01/18/2019   Procedure: EXCISION MASS NECK/NODE;  Surgeon: Edda Mt, MD;  Location: [facility];  Service: ENT;  Laterality: Left;   FEMORAL ARTERY EXPLORATION  Right 11/26/2023   Procedure: EXPLORATION, ARTERY, FEMORAL;  Surgeon: Marea Selinda RAMAN, MD;  Location: [facility];  Service: Vascular;  Laterality: Right;   IR REMOVAL TUN ACCESS W/ PORT W/O FL MOD SED  12/05/2021   LARYNGOSCOPY Bilateral 02/22/2019   Procedure: MICROSCOPIC DIRECT LARYNGOSCOPY AND BIOPSY;  Surgeon: Edda Mt, MD;  Location: [facility];  Service: ENT;  Laterality: Bilateral;   LEFT HEART CATH AND CORONARY ANGIOGRAPHY N/A 01/23/2022   Procedure: LEFT HEART CATH AND CORONARY ANGIOGRAPHY;  Surgeon: Mady Bruckner, MD;  Location: [facility];  Service: Cardiovascular;  Laterality: N/A;   LOWER EXTREMITY ANGIOGRAPHY Right 11/24/2023   Procedure: Lower Extremity Angiography;  Surgeon: Marea Selinda RAMAN, MD;  Location: [facility];  Service: Cardiovascular;  Laterality: Right;   PORTA CATH INSERTION N/A 03/24/2019   Procedure: PORTA CATH INSERTION;  Surgeon: Jama Cordella MATSU, MD;  Location: [facility];  Service: Cardiovascular;  Laterality: N/A;   TONSILLECTOMY     TUBAL  LIGATION      Family History  Problem Relation Age of Onset   Heart disease Mother    Cancer Mother    Breast cancer Sister    Brain cancer Paternal Grandfather     Allergies[1]     Latest Ref Rng & Units 12/04/2023    1:42 PM 11/27/2023    2:38 PM 11/27/2023    6:25 AM  CBC  WBC 4.0 - 10.5 K/uL 7.1  12.8  12.3   Hemoglobin 12.0 - 15.0 g/dL 89.9  8.7  8.4   Hematocrit 36.0 - 46.0 % 31.4  26.4  25.1   Platelets 150 - 400 K/uL 349  192  191       CMP     Component Value Date/Time   NA 137 12/04/2023 1342   NA 145 (H) 11/06/2023 1151   NA 140 06/24/2011 1709   K 3.9 12/04/2023 1342   K 4.3 06/24/2011 1709   CL 103 12/04/2023 1342   CL 105 06/24/2011 1709   CO2 23 12/04/2023 1342   CO2 30 06/24/2011 1709   GLUCOSE 135 (H) 12/04/2023 1342   GLUCOSE 101 (H) 06/24/2011 1709   BUN 13 12/04/2023 1342   BUN 14 11/06/2023 1151   BUN 17 06/24/2011 1709   CREATININE 1.11 (H) 12/04/2023 1342   CREATININE 0.91 06/24/2011 1709   CALCIUM  9.4 12/04/2023 1342   CALCIUM  9.4 06/24/2011 1709   PROT 7.5 12/04/2023 1342   PROT 6.7 11/06/2023 1151   PROT 8.4 (H) 06/24/2011 1709   ALBUMIN 4.0 12/04/2023 1342   ALBUMIN 4.5 11/06/2023 1151   ALBUMIN 4.1 06/24/2011 1709   AST 23 12/04/2023 1342   ALT 13 12/04/2023 1342   ALT 18 06/24/2011 1709   ALKPHOS 90 12/04/2023 1342   ALKPHOS 99 06/24/2011 1709   BILITOT 1.6 (H) 12/04/2023 1342   EGFR 63 11/06/2023 1151   GFRNONAA 56 (L) 12/04/2023 1342   GFRNONAA >60 06/24/2011 1709     VAS US  ABI WITH/WO TBI Result Date: 01/07/2024  LOWER EXTREMITY DOPPLER STUDY Patient Name:  EVANGELYNE LOJA  Date of Exam:   01/07/2024 Medical Rec #: [id number]        Accession #:    [id number] Date of Birth: [birth year]         Patient Gender: F Patient Age:   28 years Exam Location:  [facility] Procedure:      VAS US  ABI WITH/WO TBI Referring Phys: ORVIN DARING --------------------------------------------------------------------------------  Indications: rt dfa pseudo and repair  Vascular Interventions: 11/24/2023 Angiography of the right lower extremity                         including selective images of the right profunda femoris                         artery and superficial femoral artery as well as                         sidebranches                         4. Covered stent placement to the right common femoral                         artery with 6 mm diameter by 2.5 cm length Viabahn stent                         and 8 mm diameter by 5 cm length Viabahn stent                         5. Coil embolization of right common femoral artery                         branches with a 4 mm x 10 cm and a 5 cm packing Ruby                         coil. Performing Technologist: Jerel Croak RVT  Examination Guidelines: A complete evaluation includes at minimum, Doppler waveform signals and systolic blood pressure reading at the level of bilateral brachial, anterior tibial, and posterior tibial arteries, when vessel segments are accessible. Bilateral testing is considered an integral part of a complete examination. Photoelectric Plethysmograph (PPG) waveforms and toe systolic pressure readings are included as required and additional duplex testing as needed. Limited examinations for reoccurring indications may be performed as noted.  ABI Findings: +---------+------------------+-----+---------+--------+ Right    Rt Pressure (mmHg)IndexWaveform Comment  +---------+------------------+-----+---------+--------+ Brachial 114                                      +---------+------------------+-----+---------+--------+ PTA      144               1.24 triphasic         +---------+------------------+-----+---------+--------+ DP       135               1.16 triphasic         +---------+------------------+-----+---------+--------+ Great Toe131               1.13 Normal            +---------+------------------+-----+---------+--------+  +---------+------------------+-----+---------+-------+ Left     Lt Pressure (mmHg)IndexWaveform Comment +---------+------------------+-----+---------+-------+ Brachial 116                                     +---------+------------------+-----+---------+-------+ PTA      150  1.29 triphasic        +---------+------------------+-----+---------+-------+ DP       135               1.16 triphasic        +---------+------------------+-----+---------+-------+ Great Toe122               1.05 Normal           +---------+------------------+-----+---------+-------+   Summary: Right: Resting right ankle-brachial index is within normal range. The right toe-brachial index is normal.  Left: Resting left ankle-brachial index is within normal range. The left toe-brachial index is normal.  *See table(s) above for measurements and observations.  Electronically signed by Selinda Gu MD on 01/07/2024 at 3:56:19 PM.    Final        Assessment & Plan:   1. Peripheral arterial disease with history of revascularization (Primary)  Healing wound with marked reduction in size and minimal drainage. Residual numbness and tenderness expected due to nerve involvement. - Continue wound care with Santal and gauze until complete epithelialization. - Discontinue Santal and transition to simple bandaging once healed. - Provided guidance on prolonged nerve recovery. - VAS US  ABI WITH/WO TBI; Future  2. Bilateral carotid artery stenosis Bilateral carotid artery stenosis with stent Stent function stable with symptom resolution. No subclavian steal syndrome or significant narrowing. Ongoing surveillance required. - Scheduled follow-up in two months for evaluation and repeat carotid imaging. - Advised to contact office if new symptoms develop. - Vascular surgery to follow carotid stent  - VAS US  CAROTID; Future  Medications Ordered Prior to Encounter[2]  There are no Patient Instructions on  file for this visit. Return in about 2 months (around 03/28/2024) for Carotid and ABI in 2 months...see JD/FB.   Heydi Swango E Cris Gibby, NP      [1]  Allergies Allergen Reactions   Chlorhexidine      Pt doesn't have anything that requires CHG bath  [2]  Current Outpatient Medications on File Prior to Visit  Medication Sig Dispense Refill   ALPRAZolam  (XANAX ) 1 MG tablet Take 1 tablet (1 mg total) by mouth 2 (two) times daily as needed for anxiety. 60 tablet 2   aspirin  81 MG chewable tablet Chew 1 tablet (81 mg total) by mouth daily. 30 tablet 1   atorvastatin  (LIPITOR ) 80 MG tablet TAKE 1 TABLET BY MOUTH EVERY DAY 90 tablet 1   Docusate Sodium  (DSS) 100 MG CAPS Take 1 capsule by mouth every other day.     gabapentin  (NEURONTIN ) 300 MG capsule Take 1 capsule (300 mg total) by mouth 2 (two) times daily. 60 capsule 5   HYDROcodone -acetaminophen  (NORCO/VICODIN) 5-325 MG tablet Take 1-2 tablets by mouth every 6 (six) hours as needed for moderate pain (pain score 4-6). 30 tablet 0   isosorbide  mononitrate (IMDUR ) 30 MG 24 hr tablet TAKE 1/2 OF A TABLET (15 MG TOTAL) BY MOUTH DAILY 45 tablet 3   meclizine  (ANTIVERT ) 12.5 MG tablet Take 1 tablet (12.5 mg total) by mouth 3 (three) times daily as needed for dizziness or nausea. 30 tablet 0   metFORMIN  (GLUCOPHAGE -XR) 500 MG 24 hr tablet Take 1 tablet (500 mg total) by mouth daily with breakfast. 30 tablet 3   methocarbamol (ROBAXIN) 500 MG tablet Take 500 mg by mouth at bedtime. (Patient taking differently: Take 500 mg by mouth as needed.)     metoprolol  succinate (TOPROL -XL) 25 MG 24 hr tablet TAKE 0.5 TABLETS BY MOUTH DAILY. TAKE WITH OR IMMEDIATELY FOLLOWING A MEAL.  45 tablet 3   SANTYL 250 UNIT/GM ointment Apply 1 Application topically daily.     sertraline  (ZOLOFT ) 50 MG tablet Take 1 tablet (50 mg total) by mouth daily. 30 tablet 3   sulfamethoxazole -trimethoprim  (BACTRIM  DS) 800-160 MG tablet Take 1 tablet by mouth 2 (two) times daily. 20 tablet 0    ticagrelor  (BRILINTA ) 90 MG TABS tablet Take 1 tablet (90 mg total) by mouth 2 (two) times daily. 180 tablet 3   Vitamin D , Ergocalciferol , (DRISDOL ) 1.25 MG (50000 UNIT) CAPS capsule Take 50,000 Units by mouth once a week.     No current facility-administered medications on file prior to visit.   "

## 2024-01-30 ENCOUNTER — Encounter: Payer: Self-pay | Admitting: Family Medicine

## 2024-01-30 ENCOUNTER — Encounter: Payer: Self-pay | Admitting: Oncology

## 2024-01-30 ENCOUNTER — Ambulatory Visit: Admitting: Family Medicine

## 2024-01-30 VITALS — BP 127/87 | HR 94 | Resp 16 | Ht 62.0 in | Wt 168.2 lb

## 2024-01-30 DIAGNOSIS — I2510 Atherosclerotic heart disease of native coronary artery without angina pectoris: Secondary | ICD-10-CM | POA: Diagnosis not present

## 2024-01-30 DIAGNOSIS — I10 Essential (primary) hypertension: Secondary | ICD-10-CM

## 2024-01-30 DIAGNOSIS — G620 Drug-induced polyneuropathy: Secondary | ICD-10-CM | POA: Diagnosis not present

## 2024-01-30 DIAGNOSIS — I2583 Coronary atherosclerosis due to lipid rich plaque: Secondary | ICD-10-CM | POA: Diagnosis not present

## 2024-01-30 DIAGNOSIS — R7989 Other specified abnormal findings of blood chemistry: Secondary | ICD-10-CM | POA: Diagnosis not present

## 2024-01-30 DIAGNOSIS — R7303 Prediabetes: Secondary | ICD-10-CM | POA: Diagnosis not present

## 2024-01-30 DIAGNOSIS — F419 Anxiety disorder, unspecified: Secondary | ICD-10-CM | POA: Diagnosis not present

## 2024-01-30 DIAGNOSIS — T451X5S Adverse effect of antineoplastic and immunosuppressive drugs, sequela: Secondary | ICD-10-CM | POA: Diagnosis not present

## 2024-01-30 DIAGNOSIS — Z1231 Encounter for screening mammogram for malignant neoplasm of breast: Secondary | ICD-10-CM

## 2024-01-30 DIAGNOSIS — I739 Peripheral vascular disease, unspecified: Secondary | ICD-10-CM | POA: Diagnosis not present

## 2024-01-30 DIAGNOSIS — E559 Vitamin D deficiency, unspecified: Secondary | ICD-10-CM

## 2024-01-30 DIAGNOSIS — D508 Other iron deficiency anemias: Secondary | ICD-10-CM

## 2024-01-30 MED ORDER — METFORMIN HCL ER 500 MG PO TB24: 500.0000 mg | ORAL_TABLET | Freq: Every day | ORAL | 3 refills | Status: AC

## 2024-01-30 MED ORDER — VITAMIN D (ERGOCALCIFEROL) 1.25 MG (50000 UNIT) PO CAPS: 50000.0000 [IU] | ORAL_CAPSULE | ORAL | 5 refills | Status: AC

## 2024-01-30 MED ORDER — TICAGRELOR 90 MG PO TABS: 90.0000 mg | ORAL_TABLET | Freq: Two times a day (BID) | ORAL | 3 refills | Status: AC

## 2024-01-30 MED ORDER — SERTRALINE HCL 50 MG PO TABS: 50.0000 mg | ORAL_TABLET | Freq: Every day | ORAL | 3 refills | Status: AC

## 2024-01-30 MED ORDER — GABAPENTIN 300 MG PO CAPS: 300.0000 mg | ORAL_CAPSULE | Freq: Three times a day (TID) | ORAL | 5 refills | Status: AC

## 2024-01-30 MED ORDER — ALPRAZOLAM 1 MG PO TABS: 1.0000 mg | ORAL_TABLET | Freq: Two times a day (BID) | ORAL | 5 refills | Status: AC | PRN

## 2024-01-30 MED ORDER — METOPROLOL SUCCINATE ER 25 MG PO TB24: 12.5000 mg | ORAL_TABLET | Freq: Every day | ORAL | 3 refills | Status: AC

## 2024-01-30 MED ORDER — ISOSORBIDE MONONITRATE ER 30 MG PO TB24: 15.0000 mg | ORAL_TABLET | Freq: Every day | ORAL | 3 refills | Status: AC

## 2024-01-30 MED ORDER — ATORVASTATIN CALCIUM 80 MG PO TABS: 80.0000 mg | ORAL_TABLET | Freq: Every day | ORAL | 1 refills | Status: AC

## 2024-01-30 NOTE — Assessment & Plan Note (Signed)
 Hasn't had a mammogram in a few years.

## 2024-01-30 NOTE — Addendum Note (Signed)
 Addended by: Shamicka Inga K on: 01/30/2024 05:32 PM   Modules accepted: Orders

## 2024-01-30 NOTE — Assessment & Plan Note (Signed)
 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  As long as TSH stays below 10 she does not need therapy.

## 2024-01-30 NOTE — Progress Notes (Addendum)
 "  Established Patient Office Visit  Subjective   Patient ID: Connie Cameron, female    DOB: 1960-08-25  Age: 63 y.o. MRN: [id number]  Chief Complaint  Patient presents with   Anxiety    Anxiety    Discussed the use of AI scribe software for clinical note transcription with the patient, who gave verbal consent to proceed.  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  Normal study low risk for ischemia.  PAD: 05/25/2023 carotid arteries were bilateral normal   She was having episodes of blacking out and her workup revealed increased velocity left carotid artery.  She underwent carotid artery stenting (left) in October.  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  She required vascular repair with a stent in the femoral artery and 3 units of blood. During her eight-day hospital stay, she received morphine  for pain management, which she found excessive and disorienting.  She continues to experience severe pain in her right thigh, especially when pressure is applied, causing tears. She finds Gabapentin  helpful for this pain.  She has been prescribed hydrocodone  in the past but experienced severe itching as a side effect. Currently, she takes gabapentin  300 mg twice a day.  Her medication regimen includes metformin , Zoloft  50 mg, lorazepam , and metoprolol . She has run out of refills for metformin , Zoloft , and lorazepam . She takes metoprolol  25 mg but only half a tablet daily.   In her family history, there is a prevalence of heart disease and cancer, with a cousin recently diagnosed with breast cancer. She has not had a mammogram in over a  year.  She denies chest pain, mouth problems, or sore places. Reports ongoing leg pain and nerve pain. Denies smoking cigarettes but has a history of marijuana use. She has received a flu shot and discusses the importance of vaccination with her family.       Objective:     BP 127/87   Pulse 94   Resp 16   Ht 5' 2 (1.575 m)   Wt 168 lb 3.2 oz (76.3 kg)   SpO2 99%   BMI 30.76 kg/m    Physical Exam Vitals and nursing note reviewed.  Constitutional:      Appearance: Normal appearance.  HENT:     Head: Normocephalic and atraumatic.  Eyes:     Conjunctiva/sclera: Conjunctivae normal.  Cardiovascular:     Rate and Rhythm: Normal rate and regular rhythm.  Pulmonary:     Effort: Pulmonary effort is normal.     Breath sounds: Normal breath sounds.  Musculoskeletal:     Right lower leg: No edema.     Left lower leg: No edema.  Skin:    General: Skin is warm and dry.  Neurological:     Mental Status: She is alert and oriented to person, place, and time.  Psychiatric:        Mood and Affect: Mood normal.        Behavior: Behavior normal.        Thought Content: Thought content normal.        Judgment: Judgment normal.          No results found for  any visits on 01/30/24.    The ASCVD Risk score (Arnett DK, et al., 2019) failed to calculate for the following reasons:   Risk score cannot be calculated because patient has a medical history suggesting prior/existing ASCVD   * - Cholesterol units were assumed    Assessment & Plan:  Abnormal thyroid  blood test -     TSH + free T4  Anxiety -     ALPRAZolam ; Take 1 tablet (1 mg total) by mouth 2 (two) times daily as needed for anxiety.  Dispense: 60 tablet; Refill: 5 -     Sertraline  HCl; Take 1 tablet (50 mg total) by mouth daily.  Dispense: 30 tablet; Refill: 3  Morbid obesity (HCC) -     metFORMIN  HCl ER; Take 1 tablet (500 mg total) by mouth daily with breakfast.  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  Dispense: 270 capsule; Refill: 5  Other iron deficiency anemia -     Iron, TIBC and Ferritin Panel -     CBC with Differential/Platelet; Future  Primary hypertension -     Metoprolol  Succinate ER; Take 0.5 tablets (12.5 mg total) by mouth daily.  Dispense: 45 tablet; Refill: 3 -     CMP14+EGFR  PAD (peripheral artery disease) -     Atorvastatin  Calcium ; Take 1 tablet (80 mg total) by mouth daily.  Dispense: 90 tablet; Refill: 1 -     Ticagrelor ; Take 1 tablet (90 mg total) by mouth 2 (two) times daily.  Dispense: 180 tablet; Refill: 3  Coronary artery disease due to lipid rich plaque -     Isosorbide  Mononitrate ER; Take 0.5 tablets (15 mg total) by mouth daily.  Dispense: 45 tablet; Refill: 3  Vitamin D  deficiency -     Vitamin D  (Ergocalciferol ); Take 1 capsule (50,000 Units total) by mouth once a week.  Dispense: 5 capsule; Refill: 5  Borderline diabetic Assessment & Plan: 2 years ago her A1c was 6.2%.  She is on metformin  500 mg daily. Will add A1c to her blood draw.       Return in about 3 months (around 04/29/2024).    China Deitrick K Cicero Noy, MD "

## 2024-01-30 NOTE — Assessment & Plan Note (Signed)
 2 years ago her A1c was 6.2%.  She is on metformin  500 mg daily. Will check her A1c at her next blood draw.

## 2024-01-31 ENCOUNTER — Other Ambulatory Visit (INDEPENDENT_AMBULATORY_CARE_PROVIDER_SITE_OTHER): Payer: Self-pay | Admitting: Nurse Practitioner

## 2024-01-31 LAB — CMP14+EGFR
ALT: 11 IU/L (ref 0–32)
AST: 21 IU/L (ref 0–40)
Albumin: 4.1 g/dL (ref 3.9–4.9)
Alkaline Phosphatase: 123 IU/L (ref 49–135)
BUN/Creatinine Ratio: 14 (ref 12–28)
BUN: 13 mg/dL (ref 8–27)
Bilirubin Total: 0.3 mg/dL (ref 0.0–1.2)
CO2: 23 mmol/L (ref 20–29)
Calcium: 9.6 mg/dL (ref 8.7–10.3)
Chloride: 106 mmol/L (ref 96–106)
Creatinine, Ser: 0.92 mg/dL (ref 0.57–1.00)
Globulin, Total: 2.4 g/dL (ref 1.5–4.5)
Glucose: 91 mg/dL (ref 70–99)
Potassium: 4.9 mmol/L (ref 3.5–5.2)
Sodium: 143 mmol/L (ref 134–144)
Total Protein: 6.5 g/dL (ref 6.0–8.5)
eGFR: 70 mL/min/1.73

## 2024-01-31 LAB — IRON,TIBC AND FERRITIN PANEL
Ferritin: 138 ng/mL (ref 15–150)
Iron Saturation: 22 % (ref 15–55)
Iron: 53 ug/dL (ref 27–139)
Total Iron Binding Capacity: 245 ug/dL — ABNORMAL LOW (ref 250–450)
UIBC: 192 ug/dL (ref 118–369)

## 2024-01-31 LAB — TSH+FREE T4
Free T4: 0.74 ng/dL — ABNORMAL LOW (ref 0.82–1.77)
TSH: 6.21 u[IU]/mL — ABNORMAL HIGH (ref 0.450–4.500)

## 2024-02-02 ENCOUNTER — Ambulatory Visit: Admitting: Family Medicine

## 2024-02-03 ENCOUNTER — Encounter: Payer: Self-pay | Admitting: Oncology

## 2024-02-04 ENCOUNTER — Inpatient Hospital Stay

## 2024-02-04 DIAGNOSIS — D701 Agranulocytosis secondary to cancer chemotherapy: Secondary | ICD-10-CM

## 2024-02-04 DIAGNOSIS — E538 Deficiency of other specified B group vitamins: Secondary | ICD-10-CM | POA: Diagnosis not present

## 2024-02-04 MED ORDER — CYANOCOBALAMIN 1000 MCG/ML IJ SOLN
1000.0000 ug | Freq: Once | INTRAMUSCULAR | Status: AC
  Administered 2024-02-04: 1000 ug via INTRAMUSCULAR
  Filled 2024-02-04: qty 1

## 2024-02-16 ENCOUNTER — Ambulatory Visit: Payer: Self-pay | Admitting: Family Medicine

## 2024-02-23 NOTE — Progress Notes (Unsigned)
 "  Referring Physician:  [name], PA [address] [place],  [place] [address]  Primary Physician:  Ziglar, Susan K, MD  History of Present Illness: 02/25/2024 [name] is here today with a chief complaint of neck pain with radiation down her right upper extremity.  This has improved since September.  It radiates down her arm intermittently with associated numbness and tingling primarily into her middle and ring finger.  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  She often hears popping when she turns her neck a certain way at some points when she turns to the side she will feel the pain shoot down her arm.  She denies any weakness or trouble with fine motor skills in her hands.  No changes to her gait, issues with incontinence.    Duration: x 3 months  Bowel/Bladder Dysfunction: none  Conservative measures:  Physical therapy: Has not participated in Multimodal medical therapy including regular antiinflammatories:  methocarbamol, gabapentin , hydrocodone  Injections:  epidural steroid injections  Past Surgery: none in the past for the spine  Connie Cameron has no symptoms of cervical myelopathy.  The symptoms are causing a significant impact on the patient's life.   Review of Systems:  A 10 point review of systems is negative, except for the pertinent positives and negatives detailed in the HPI.  Past Medical History: 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(HCC)     Past Surgical History: Past Surgical History:  Procedure Laterality Date   CAROTID PTA/STENT INTERVENTION N/A 11/24/2023   Procedure: CAROTID PTA/STENT INTERVENTION;  Surgeon: Marea Selinda RAMAN, MD;  Location: [facility];  Service: Cardiovascular;  Laterality: N/A;   COLONOSCOPY WITH PROPOFOL  N/A 03/30/2020    Procedure: COLONOSCOPY WITH PROPOFOL ;  Surgeon: Janalyn Keene NOVAK, MD;  Location: [facility];  Service: Endoscopy;  Laterality: N/A;  COVID POSITIVE 02/29/2020   CORONARY/GRAFT ACUTE MI REVASCULARIZATION N/A 01/23/2022   Procedure: Coronary/Graft Acute MI Revascularization;  Surgeon: Mady Bruckner, MD;  Location: [facility];  Service: Cardiovascular;  Laterality: N/A;   ESOPHAGOGASTRODUODENOSCOPY N/A 07/24/2023   Procedure: EGD (ESOPHAGOGASTRODUODENOSCOPY);  Surgeon: Unk Corinn Skiff, MD;  Location: [facility];  Service: Gastroenterology;  Laterality: N/A;   ESOPHAGOGASTRODUODENOSCOPY (EGD) WITH PROPOFOL  N/A 03/30/2020   Procedure: ESOPHAGOGASTRODUODENOSCOPY (EGD) WITH PROPOFOL ;  Surgeon: Janalyn Keene NOVAK, MD;  Location: [facility];  Service: Endoscopy;  Laterality: N/A;   ESOPHAGOGASTRODUODENOSCOPY (EGD) WITH PROPOFOL  N/A 08/03/2020   Procedure: ESOPHAGOGASTRODUODENOSCOPY (EGD) WITH PROPOFOL ;  Surgeon: Janalyn Keene NOVAK, MD;  Location: [facility];  Service: Endoscopy;  Laterality: N/A;   ESOPHAGOGASTRODUODENOSCOPY (EGD) WITH PROPOFOL  N/A 01/25/2021   Procedure: ESOPHAGOGASTRODUODENOSCOPY (EGD) WITH PROPOFOL ;  Surgeon: Janalyn Keene NOVAK, MD;  Location: [facility];  Service: Endoscopy;  Laterality: N/A;   ESOPHAGOGASTRODUODENOSCOPY (EGD) WITH PROPOFOL  N/A 09/05/2021   Procedure: ESOPHAGOGASTRODUODENOSCOPY (EGD) WITH PROPOFOL ;  Surgeon: Unk Corinn Skiff, MD;  Location: [facility];  Service: Gastroenterology;  Laterality: N/A;   EXCISION MASS NECK Left 01/18/2019   Procedure: EXCISION MASS NECK/NODE;  Surgeon: Edda Mt, MD;  Location: [facility];  Service: ENT;  Laterality: Left;   FEMORAL ARTERY EXPLORATION Right 11/26/2023   Procedure: EXPLORATION, ARTERY, FEMORAL;  Surgeon: Marea Selinda RAMAN, MD;  Location: [facility];  Service: Vascular;  Laterality: Right;   IR  REMOVAL TUN ACCESS W/ PORT W/O FL MOD SED  12/05/2021   LARYNGOSCOPY Bilateral 02/22/2019    Procedure: MICROSCOPIC DIRECT LARYNGOSCOPY AND BIOPSY;  Surgeon: Edda Mt, MD;  Location: [facility];  Service: ENT;  Laterality: Bilateral;   LEFT HEART CATH AND CORONARY ANGIOGRAPHY N/A 01/23/2022   Procedure: LEFT HEART CATH AND CORONARY ANGIOGRAPHY;  Surgeon: Mady Bruckner, MD;  Location: [facility];  Service: Cardiovascular;  Laterality: N/A;   LOWER EXTREMITY ANGIOGRAPHY Right 11/24/2023   Procedure: Lower Extremity Angiography;  Surgeon: Marea Selinda RAMAN, MD;  Location: [facility];  Service: Cardiovascular;  Laterality: Right;   PORTA CATH INSERTION N/A 03/24/2019   Procedure: PORTA CATH INSERTION;  Surgeon: Jama Cordella MATSU, MD;  Location: [facility];  Service: Cardiovascular;  Laterality: N/A;   TONSILLECTOMY     TUBAL LIGATION      Allergies: Allergies as of 02/25/2024 - Review Complete 02/25/2024  Allergen Reaction Noted   Chlorhexidine   06/29/2020    Medications: Outpatient Encounter Medications as of 02/25/2024  Medication Sig   ALPRAZolam  (XANAX ) 1 MG tablet Take 1 tablet (1 mg total) by mouth 2 (two) times daily as needed for anxiety.   aspirin  81 MG chewable tablet Chew 1 tablet (81 mg total) by mouth daily.   atorvastatin  (LIPITOR ) 80 MG tablet Take 1 tablet (80 mg total) by mouth daily.   Docusate Sodium  (DSS) 100 MG CAPS Take 1 capsule by mouth every other day.   gabapentin  (NEURONTIN ) 300 MG capsule Take 1 capsule (300 mg total) by mouth 3 (three) times daily.   isosorbide  mononitrate (IMDUR ) 30 MG 24 hr tablet Take 0.5 tablets (15 mg total) by mouth daily.   meclizine  (ANTIVERT ) 12.5 MG tablet Take 1 tablet (12.5 mg total) by mouth 3 (three) times daily as needed for dizziness or nausea.   metFORMIN  (GLUCOPHAGE -XR) 500 MG 24 hr tablet Take 1 tablet (500 mg total) by mouth daily with breakfast.   methocarbamol (ROBAXIN) 500 MG tablet Take 500 mg by mouth at bedtime. (Patient taking differently: Take 500 mg by mouth as needed.)    metoprolol  succinate (TOPROL -XL) 25 MG 24 hr tablet Take 0.5 tablets (12.5 mg total) by mouth daily.   omeprazole  (PRILOSEC) 40 MG capsule Take 40 mg by mouth 2 (two) times daily.   SANTYL 250 UNIT/GM ointment Apply 1 Application topically daily.   sertraline  (ZOLOFT ) 50 MG tablet Take 1 tablet (50 mg total) by mouth daily.   ticagrelor  (BRILINTA ) 90 MG TABS tablet Take 1 tablet (90 mg total) by mouth 2 (two) times daily.   Vitamin D , Ergocalciferol , (DRISDOL ) 1.25 MG (50000 UNIT) CAPS capsule Take 1 capsule (50,000 Units total) by mouth once a week.   [DISCONTINUED] HYDROcodone -acetaminophen  (NORCO/VICODIN) 5-325 MG tablet Take 1-2 tablets by mouth every 6 (six) hours as needed for moderate pain (pain score 4-6).   [DISCONTINUED] sulfamethoxazole -trimethoprim  (BACTRIM  DS) 800-160 MG tablet Take 1 tablet by mouth 2 (two) times daily.   No facility-administered encounter medications on file as of 02/25/2024.    Social History: Social History[1]  Family Medical History: Family History  Problem Relation Age of Onset   Heart disease Mother    Cancer Mother    Breast cancer Sister    Brain cancer Paternal Grandfather     Physical Examination: @VITALWITHPAIN @  General: Patient is well developed, well nourished, calm, collected, and in no apparent distress. Attention to examination is appropriate.  Psychiatric: Patient is non-anxious.  Head:  Pupils equal,  round, and reactive to light.  ENT:  Oral mucosa appears well hydrated.  Neck:   Supple.    Respiratory: Patient is breathing without any difficulty.  Extremities: No edema.  Vascular: Palpable dorsal pedal pulses.  Skin:   On exposed skin, there are no abnormal skin lesions.  NEUROLOGICAL:     Awake, alert, oriented to person, place, and time.  Speech is clear and fluent. Fund of knowledge is appropriate.   Cranial Nerves: Pupils equal round and reactive to light.  Facial tone is symmetric.  ROM of spine: Some limited  range of motion in cervical spine.  Positive Spurling's  Strength: Side Biceps Triceps Deltoid Interossei Grip Wrist Ext. Wrist Flex.  R 5 5 5 5 5 5 5   L 5 5 5 5 5 5 5        No hoffmans, largely hyporeflexic throughout bilateral upper extremities.  Gait is normal.   No difficulty with tandem gait.   No evidence of dysmetria noted.  Medical Decision Making  Imaging: EXAM: CT NECK WITH CONTRAST 10/29/2023 02:18:58 PM   TECHNIQUE: CT of the neck was performed with the administration of 75 mL of iopamidol  (ISOVUE -300) 61% injection. Multiplanar reformatted images are provided for review. Automated exposure control, iterative reconstruction, and/or weight based adjustment of the mA/kV was utilized to reduce the radiation dose to as low as reasonably achievable.   COMPARISON: CT of the neck dated 11/08/2020.   CLINICAL HISTORY: History of neck cancer and left otalgia and hoarseness.   FINDINGS:   There are no findings concerning for residual or recurrent malignancy.   AERODIGESTIVE TRACT: No discrete mass. No edema.   SALIVARY GLANDS: The parotid and submandibular glands are unremarkable.   THYROID : Unremarkable.   LYMPH NODES: No suspicious cervical lymphadenopathy.   SOFT TISSUES: No mass or fluid collection.   BRAIN, ORBITS, SINUSES AND MASTOIDS: No acute abnormality.   LUNGS AND MEDIASTINUM: No acute abnormality.   BONES: There is chronic degenerative disc disease at C5-C6. No focal bone abnormality.   VASCULATURE: There is atheromatous disease within the proximal internal carotid arteries bilaterally, more pronounced on the left where there is approximately 50% luminal stenosis.   IMPRESSION: 1. No evidence of residual or recurrent neck malignancy. 2. 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 3. Chronic degenerative disc disease at C5-6.  I have personally reviewed the  images and agree with the above interpretation.  Assessment and Plan: Ms. Cosman is a pleasant 64 y.o. female with a history of tongue and neck cancer with radiation to the left side that has improved some since it first began in September.  She continues to have intermittent numbness and tingling down her right arm, but denies weakness at this time.  On soft tissue CT of her neck there was chronic degenerative changes of her cervical spine most pronounced at C5-6.  Patient very well could be having a improved cervical radiculopathy.  Patient is interested in injections due to her baseline neck pain.  Would like her to undergo x-rays of cervical spine with flexion and extension today as well as an MRI of her cervical spine.  Will make appropriate referral to the pain team if needed.  She would like to hold off on formal physical therapy at this time.    Thank you for involving me in the care of this patient.    Lyle Decamp, PA-C Dept. of Neurosurgery     [1]  Social History Tobacco Use   Smoking status: Never    Passive exposure: Never   Smokeless tobacco: Never  Vaping Use   Vaping status: Never Used  Substance Use Topics   Alcohol use: Not Currently   Drug use: No   "

## 2024-02-25 ENCOUNTER — Encounter: Payer: Self-pay | Admitting: Physician Assistant

## 2024-02-25 ENCOUNTER — Ambulatory Visit: Admitting: Physician Assistant

## 2024-02-25 ENCOUNTER — Ambulatory Visit

## 2024-02-25 VITALS — BP 124/88 | Ht 62.0 in | Wt 166.5 lb

## 2024-02-25 DIAGNOSIS — M5030 Other cervical disc degeneration, unspecified cervical region: Secondary | ICD-10-CM

## 2024-02-25 DIAGNOSIS — R0602 Shortness of breath: Secondary | ICD-10-CM

## 2024-02-25 DIAGNOSIS — M50322 Other cervical disc degeneration at C5-C6 level: Secondary | ICD-10-CM

## 2024-02-25 DIAGNOSIS — C4442 Squamous cell carcinoma of skin of scalp and neck: Secondary | ICD-10-CM | POA: Diagnosis not present

## 2024-03-01 ENCOUNTER — Ambulatory Visit: Payer: Self-pay | Admitting: Family Medicine

## 2024-03-05 ENCOUNTER — Inpatient Hospital Stay: Attending: Internal Medicine

## 2024-03-05 DIAGNOSIS — D701 Agranulocytosis secondary to cancer chemotherapy: Secondary | ICD-10-CM

## 2024-03-05 MED ORDER — CYANOCOBALAMIN 1000 MCG/ML IJ SOLN
1000.0000 ug | Freq: Once | INTRAMUSCULAR | Status: AC
  Administered 2024-03-05: 1000 ug via INTRAMUSCULAR
  Filled 2024-03-05: qty 1

## 2024-03-29 ENCOUNTER — Encounter (INDEPENDENT_AMBULATORY_CARE_PROVIDER_SITE_OTHER)

## 2024-03-29 ENCOUNTER — Ambulatory Visit (INDEPENDENT_AMBULATORY_CARE_PROVIDER_SITE_OTHER): Admitting: Nurse Practitioner

## 2024-04-02 ENCOUNTER — Inpatient Hospital Stay: Attending: Internal Medicine

## 2024-04-30 ENCOUNTER — Inpatient Hospital Stay: Attending: Internal Medicine

## 2024-05-31 ENCOUNTER — Inpatient Hospital Stay: Attending: Internal Medicine

## 2024-07-02 ENCOUNTER — Inpatient Hospital Stay

## 2024-07-02 ENCOUNTER — Inpatient Hospital Stay: Admitting: Internal Medicine

## 2024-07-30 ENCOUNTER — Ambulatory Visit: Admitting: Family Medicine
